# Patient Record
Sex: Female | Born: 1935 | Race: White | Hispanic: No | Smoking: Never smoker
Health system: Southern US, Community
[De-identification: ages and names within clinical notes are randomized; demographics above are authoritative.]

## PROBLEM LIST (undated history)

## (undated) DIAGNOSIS — M545 Low back pain, unspecified: Secondary | ICD-10-CM

## (undated) DIAGNOSIS — F419 Anxiety disorder, unspecified: Secondary | ICD-10-CM

## (undated) DIAGNOSIS — K219 Gastro-esophageal reflux disease without esophagitis: Secondary | ICD-10-CM

## (undated) DIAGNOSIS — F329 Major depressive disorder, single episode, unspecified: Secondary | ICD-10-CM

## (undated) DIAGNOSIS — D1803 Hemangioma of intra-abdominal structures: Secondary | ICD-10-CM

## (undated) DIAGNOSIS — C50919 Malignant neoplasm of unspecified site of unspecified female breast: Secondary | ICD-10-CM

## (undated) DIAGNOSIS — F32A Depression, unspecified: Secondary | ICD-10-CM

## (undated) DIAGNOSIS — IMO0002 Reserved for concepts with insufficient information to code with codable children: Secondary | ICD-10-CM

## (undated) DIAGNOSIS — I10 Essential (primary) hypertension: Secondary | ICD-10-CM

## (undated) DIAGNOSIS — G4700 Insomnia, unspecified: Secondary | ICD-10-CM

## (undated) DIAGNOSIS — E785 Hyperlipidemia, unspecified: Secondary | ICD-10-CM

## (undated) DIAGNOSIS — R202 Paresthesia of skin: Secondary | ICD-10-CM

## (undated) DIAGNOSIS — IMO0001 Reserved for inherently not codable concepts without codable children: Secondary | ICD-10-CM

## (undated) DIAGNOSIS — N281 Cyst of kidney, acquired: Secondary | ICD-10-CM

## (undated) DIAGNOSIS — R011 Cardiac murmur, unspecified: Secondary | ICD-10-CM

## (undated) DIAGNOSIS — B029 Zoster without complications: Secondary | ICD-10-CM

## (undated) HISTORY — DX: Low back pain, unspecified: M54.50

## (undated) HISTORY — DX: Zoster without complications: B02.9

## (undated) HISTORY — DX: Anxiety disorder, unspecified: F41.9

## (undated) HISTORY — DX: Reserved for inherently not codable concepts without codable children: IMO0001

## (undated) HISTORY — DX: Hyperlipidemia, unspecified: E78.5

## (undated) HISTORY — PX: ABDOMINAL HYSTERECTOMY: SHX81

## (undated) HISTORY — DX: Cardiac murmur, unspecified: R01.1

## (undated) HISTORY — DX: Paresthesia of skin: R20.2

## (undated) HISTORY — DX: Gastro-esophageal reflux disease without esophagitis: K21.9

## (undated) HISTORY — DX: Reserved for concepts with insufficient information to code with codable children: IMO0002

## (undated) HISTORY — DX: Low back pain: M54.5

## (undated) HISTORY — DX: Essential (primary) hypertension: I10

## (undated) HISTORY — DX: Depression, unspecified: F32.A

## (undated) HISTORY — DX: Hemangioma of intra-abdominal structures: D18.03

## (undated) HISTORY — DX: Cyst of kidney, acquired: N28.1

## (undated) HISTORY — DX: Major depressive disorder, single episode, unspecified: F32.9

## (undated) HISTORY — DX: Insomnia, unspecified: G47.00

## (undated) HISTORY — DX: Malignant neoplasm of unspecified site of unspecified female breast: C50.919

---

## 1998-08-29 ENCOUNTER — Other Ambulatory Visit: Admission: RE | Admit: 1998-08-29 | Discharge: 1998-08-29 | Payer: Self-pay | Admitting: Obstetrics & Gynecology

## 2000-01-09 ENCOUNTER — Ambulatory Visit (HOSPITAL_COMMUNITY): Admission: RE | Admit: 2000-01-09 | Discharge: 2000-01-09 | Payer: Self-pay | Admitting: Internal Medicine

## 2000-01-09 ENCOUNTER — Encounter: Payer: Self-pay | Admitting: Internal Medicine

## 2000-02-27 ENCOUNTER — Other Ambulatory Visit: Admission: RE | Admit: 2000-02-27 | Discharge: 2000-02-27 | Payer: Self-pay | Admitting: Obstetrics & Gynecology

## 2001-04-02 ENCOUNTER — Ambulatory Visit: Admission: RE | Admit: 2001-04-02 | Discharge: 2001-04-02 | Payer: Self-pay | Admitting: Internal Medicine

## 2001-04-02 ENCOUNTER — Encounter: Payer: Self-pay | Admitting: Internal Medicine

## 2002-05-06 ENCOUNTER — Other Ambulatory Visit: Admission: RE | Admit: 2002-05-06 | Discharge: 2002-05-06 | Payer: Self-pay | Admitting: Obstetrics & Gynecology

## 2004-07-02 ENCOUNTER — Ambulatory Visit: Payer: Self-pay | Admitting: Internal Medicine

## 2004-07-05 ENCOUNTER — Ambulatory Visit: Payer: Self-pay | Admitting: Internal Medicine

## 2004-08-12 ENCOUNTER — Ambulatory Visit: Payer: Self-pay | Admitting: Internal Medicine

## 2005-01-23 ENCOUNTER — Ambulatory Visit: Payer: Self-pay | Admitting: Psychology

## 2005-02-21 ENCOUNTER — Ambulatory Visit: Payer: Self-pay | Admitting: Psychology

## 2005-05-27 ENCOUNTER — Ambulatory Visit: Payer: Self-pay | Admitting: Internal Medicine

## 2005-06-02 ENCOUNTER — Ambulatory Visit: Payer: Self-pay | Admitting: Internal Medicine

## 2005-06-10 ENCOUNTER — Other Ambulatory Visit: Admission: RE | Admit: 2005-06-10 | Discharge: 2005-06-10 | Payer: Self-pay | Admitting: Obstetrics & Gynecology

## 2005-07-03 ENCOUNTER — Ambulatory Visit: Payer: Self-pay | Admitting: Internal Medicine

## 2005-10-02 ENCOUNTER — Ambulatory Visit: Payer: Self-pay | Admitting: Internal Medicine

## 2006-01-22 ENCOUNTER — Ambulatory Visit: Payer: Self-pay | Admitting: Internal Medicine

## 2006-02-09 ENCOUNTER — Ambulatory Visit: Payer: Self-pay | Admitting: Internal Medicine

## 2006-02-17 ENCOUNTER — Ambulatory Visit: Payer: Self-pay

## 2006-03-05 ENCOUNTER — Ambulatory Visit: Payer: Self-pay | Admitting: Internal Medicine

## 2006-04-23 ENCOUNTER — Ambulatory Visit: Payer: Self-pay | Admitting: Internal Medicine

## 2006-10-16 ENCOUNTER — Ambulatory Visit: Payer: Self-pay | Admitting: Internal Medicine

## 2006-10-16 LAB — CONVERTED CEMR LAB
ALT: 11 units/L (ref 0–40)
AST: 18 units/L (ref 0–37)
BUN: 19 mg/dL (ref 6–23)
Bacteria, UA: NEGATIVE
Bilirubin Urine: NEGATIVE
Cholesterol: 143 mg/dL (ref 0–200)
Creatinine, Ser: 0.9 mg/dL (ref 0.4–1.2)
Crystals: NEGATIVE
Glucose, Bld: 111 mg/dL — ABNORMAL HIGH (ref 70–99)
HDL: 48.2 mg/dL (ref >39.0)
Hemoglobin, Urine: NEGATIVE
Ketones, ur: NEGATIVE mg/dL
LDL Cholesterol: 62 mg/dL (ref 0–99)
Mucus, UA: NEGATIVE
Nitrite: NEGATIVE
Potassium: 4.6 meq/L (ref 3.5–5.1)
Sodium: 143 meq/L (ref 135–145)
Specific Gravity, Urine: 1.01 (ref 1.000–1.03)
TSH: 1.54 microintl units/mL (ref 0.35–5.50)
Total CHOL/HDL Ratio: 3
Total Protein, Urine: NEGATIVE mg/dL
Triglycerides: 165 mg/dL — ABNORMAL HIGH (ref 0–149)
Urine Glucose: NEGATIVE mg/dL
Urobilinogen, UA: 0.2 (ref 0.0–1.0)
VLDL: 33 mg/dL (ref 0–40)
Vit D, 1,25-Dihydroxy: 23 (ref 20–57)
pH: 6 (ref 5.0–8.0)

## 2006-10-23 ENCOUNTER — Ambulatory Visit: Payer: Self-pay | Admitting: Internal Medicine

## 2007-02-01 ENCOUNTER — Ambulatory Visit: Payer: Self-pay | Admitting: Internal Medicine

## 2007-03-24 ENCOUNTER — Encounter: Payer: Self-pay | Admitting: *Deleted

## 2007-03-24 ENCOUNTER — Ambulatory Visit: Payer: Self-pay | Admitting: Internal Medicine

## 2007-03-24 DIAGNOSIS — R209 Unspecified disturbances of skin sensation: Secondary | ICD-10-CM

## 2007-03-24 DIAGNOSIS — D1803 Hemangioma of intra-abdominal structures: Secondary | ICD-10-CM | POA: Insufficient documentation

## 2007-03-24 DIAGNOSIS — I10 Essential (primary) hypertension: Secondary | ICD-10-CM

## 2007-03-24 DIAGNOSIS — K219 Gastro-esophageal reflux disease without esophagitis: Secondary | ICD-10-CM

## 2007-03-24 DIAGNOSIS — Z8679 Personal history of other diseases of the circulatory system: Secondary | ICD-10-CM | POA: Insufficient documentation

## 2007-03-24 DIAGNOSIS — N281 Cyst of kidney, acquired: Secondary | ICD-10-CM | POA: Insufficient documentation

## 2007-03-24 DIAGNOSIS — F329 Major depressive disorder, single episode, unspecified: Secondary | ICD-10-CM | POA: Insufficient documentation

## 2007-03-24 DIAGNOSIS — M810 Age-related osteoporosis without current pathological fracture: Secondary | ICD-10-CM

## 2007-03-24 DIAGNOSIS — E785 Hyperlipidemia, unspecified: Secondary | ICD-10-CM

## 2007-03-24 DIAGNOSIS — Z9079 Acquired absence of other genital organ(s): Secondary | ICD-10-CM | POA: Insufficient documentation

## 2007-03-24 DIAGNOSIS — Z87898 Personal history of other specified conditions: Secondary | ICD-10-CM | POA: Insufficient documentation

## 2007-04-26 ENCOUNTER — Ambulatory Visit: Payer: Self-pay | Admitting: Internal Medicine

## 2007-04-26 DIAGNOSIS — M79609 Pain in unspecified limb: Secondary | ICD-10-CM | POA: Insufficient documentation

## 2007-04-26 LAB — CONVERTED CEMR LAB
ALT: 12 units/L (ref 0–35)
AST: 18 units/L (ref 0–37)
Albumin: 3.8 g/dL (ref 3.5–5.2)
Alkaline Phosphatase: 41 units/L (ref 39–117)
BUN: 17 mg/dL (ref 6–23)
Bilirubin, Direct: 0.1 mg/dL (ref 0.0–0.3)
CO2: 30 meq/L (ref 19–32)
Calcium: 8.9 mg/dL (ref 8.4–10.5)
Chloride: 110 meq/L (ref 96–112)
Cholesterol: 168 mg/dL (ref 0–200)
Creatinine, Ser: 0.8 mg/dL (ref 0.4–1.2)
GFR calc Af Amer: 91 mL/min
GFR calc non Af Amer: 75 mL/min
Glucose, Bld: 114 mg/dL — ABNORMAL HIGH (ref 70–99)
HDL: 47.6 mg/dL (ref >39.0)
Hgb A1c MFr Bld: 5.7 % (ref 4.6–6.0)
LDL Cholesterol: 96 mg/dL (ref 0–99)
Potassium: 4.1 meq/L (ref 3.5–5.1)
Sodium: 145 meq/L (ref 135–145)
Total Bilirubin: 0.8 mg/dL (ref 0.3–1.2)
Total CHOL/HDL Ratio: 3.5
Total Protein: 6.3 g/dL (ref 6.0–8.3)
Triglycerides: 120 mg/dL (ref 0–149)
VLDL: 24 mg/dL (ref 0–40)

## 2007-05-07 ENCOUNTER — Ambulatory Visit: Payer: Self-pay | Admitting: Internal Medicine

## 2007-05-07 DIAGNOSIS — R7309 Other abnormal glucose: Secondary | ICD-10-CM

## 2007-05-10 ENCOUNTER — Ambulatory Visit: Payer: Self-pay | Admitting: Internal Medicine

## 2007-05-10 ENCOUNTER — Encounter: Payer: Self-pay | Admitting: Internal Medicine

## 2007-06-29 ENCOUNTER — Other Ambulatory Visit: Admission: RE | Admit: 2007-06-29 | Discharge: 2007-06-29 | Payer: Self-pay | Admitting: Radiology

## 2007-07-13 ENCOUNTER — Telehealth: Payer: Self-pay | Admitting: Internal Medicine

## 2007-09-30 ENCOUNTER — Telehealth: Payer: Self-pay | Admitting: Internal Medicine

## 2007-10-05 ENCOUNTER — Encounter: Payer: Self-pay | Admitting: Internal Medicine

## 2007-10-06 ENCOUNTER — Ambulatory Visit: Payer: Self-pay | Admitting: Internal Medicine

## 2007-10-06 ENCOUNTER — Telehealth: Payer: Self-pay | Admitting: Internal Medicine

## 2007-10-06 ENCOUNTER — Encounter: Payer: Self-pay | Admitting: Internal Medicine

## 2007-10-28 ENCOUNTER — Ambulatory Visit: Payer: Self-pay | Admitting: Internal Medicine

## 2007-10-29 LAB — CONVERTED CEMR LAB
ALT: 13 units/L (ref 0–35)
AST: 19 units/L (ref 0–37)
Albumin: 4 g/dL (ref 3.5–5.2)
Alkaline Phosphatase: 56 units/L (ref 39–117)
BUN: 14 mg/dL (ref 6–23)
Basophils Absolute: 0.1 10*3/uL (ref 0.0–0.1)
Basophils Relative: 0.9 % (ref 0.0–1.0)
Bilirubin, Direct: 0.1 mg/dL (ref 0.0–0.3)
CO2: 27 meq/L (ref 19–32)
Calcium: 9.3 mg/dL (ref 8.4–10.5)
Chloride: 104 meq/L (ref 96–112)
Cholesterol: 235 mg/dL (ref 0–200)
Creatinine, Ser: 0.9 mg/dL (ref 0.4–1.2)
Direct LDL: 159 mg/dL
Eosinophils Absolute: 0.1 10*3/uL (ref 0.0–0.7)
Eosinophils Relative: 1.4 % (ref 0.0–5.0)
GFR calc Af Amer: 79 mL/min
GFR calc non Af Amer: 65 mL/min
Glucose, Bld: 117 mg/dL — ABNORMAL HIGH (ref 70–99)
HCT: 42 % (ref 36.0–46.0)
HDL: 51.6 mg/dL (ref >39.0)
Hemoglobin: 14.4 g/dL (ref 12.0–15.0)
Lymphocytes Relative: 33.1 % (ref 12.0–46.0)
MCHC: 34.3 g/dL (ref 30.0–36.0)
MCV: 84.6 fL (ref 78.0–100.0)
Monocytes Absolute: 0.6 10*3/uL (ref 0.1–1.0)
Monocytes Relative: 9.6 % (ref 3.0–12.0)
Neutro Abs: 3.3 10*3/uL (ref 1.4–7.7)
Neutrophils Relative %: 55 % (ref 43.0–77.0)
Platelets: 242 10*3/uL (ref 150–400)
Potassium: 4.2 meq/L (ref 3.5–5.1)
RBC: 4.96 M/uL (ref 3.87–5.11)
RDW: 12.5 % (ref 11.5–14.6)
Sodium: 140 meq/L (ref 135–145)
TSH: 1.53 microintl units/mL (ref 0.35–5.50)
Total Bilirubin: 0.9 mg/dL (ref 0.3–1.2)
Total CHOL/HDL Ratio: 4.6
Total Protein: 6.9 g/dL (ref 6.0–8.3)
Triglycerides: 126 mg/dL (ref 0–149)
VLDL: 25 mg/dL (ref 0–40)
WBC: 6.1 10*3/uL (ref 4.5–10.5)

## 2007-11-03 ENCOUNTER — Ambulatory Visit: Payer: Self-pay | Admitting: Internal Medicine

## 2007-11-03 DIAGNOSIS — IMO0001 Reserved for inherently not codable concepts without codable children: Secondary | ICD-10-CM

## 2008-02-29 ENCOUNTER — Ambulatory Visit: Payer: Self-pay | Admitting: Internal Medicine

## 2008-02-29 LAB — CONVERTED CEMR LAB: Vit D, 1,25-Dihydroxy: 29 — ABNORMAL LOW (ref 30–89)

## 2008-03-01 LAB — CONVERTED CEMR LAB
BUN: 17 mg/dL (ref 6–23)
CO2: 30 meq/L (ref 19–32)
Calcium: 9.2 mg/dL (ref 8.4–10.5)
Chloride: 103 meq/L (ref 96–112)
Cholesterol: 219 mg/dL (ref 0–200)
Creatinine, Ser: 0.9 mg/dL (ref 0.4–1.2)
Direct LDL: 133.2 mg/dL
GFR calc Af Amer: 79 mL/min
GFR calc non Af Amer: 65 mL/min
Glucose, Bld: 112 mg/dL — ABNORMAL HIGH (ref 70–99)
HDL: 47.7 mg/dL (ref >39.0)
Potassium: 4.6 meq/L (ref 3.5–5.1)
Sodium: 141 meq/L (ref 135–145)
Total CHOL/HDL Ratio: 4.6
Triglycerides: 179 mg/dL — ABNORMAL HIGH (ref 0–149)
VLDL: 36 mg/dL (ref 0–40)

## 2008-03-07 ENCOUNTER — Ambulatory Visit: Payer: Self-pay | Admitting: Internal Medicine

## 2008-06-27 ENCOUNTER — Ambulatory Visit: Payer: Self-pay | Admitting: Internal Medicine

## 2008-06-27 DIAGNOSIS — R42 Dizziness and giddiness: Secondary | ICD-10-CM | POA: Insufficient documentation

## 2008-07-03 ENCOUNTER — Ambulatory Visit: Payer: Self-pay | Admitting: Internal Medicine

## 2008-07-03 LAB — CONVERTED CEMR LAB
BUN: 22 mg/dL (ref 6–23)
CO2: 31 meq/L (ref 19–32)
Calcium: 9 mg/dL (ref 8.4–10.5)
Chloride: 101 meq/L (ref 96–112)
Creatinine, Ser: 0.8 mg/dL (ref 0.4–1.2)
GFR calc Af Amer: 90 mL/min
GFR calc non Af Amer: 75 mL/min
Glucose, Bld: 105 mg/dL — ABNORMAL HIGH (ref 70–99)
Potassium: 3.9 meq/L (ref 3.5–5.1)
Sodium: 140 meq/L (ref 135–145)

## 2008-07-11 ENCOUNTER — Ambulatory Visit: Payer: Self-pay | Admitting: Internal Medicine

## 2008-07-11 DIAGNOSIS — M545 Low back pain: Secondary | ICD-10-CM

## 2008-08-14 ENCOUNTER — Telehealth: Payer: Self-pay | Admitting: Internal Medicine

## 2008-08-16 ENCOUNTER — Ambulatory Visit: Payer: Self-pay | Admitting: Internal Medicine

## 2008-08-16 ENCOUNTER — Encounter (INDEPENDENT_AMBULATORY_CARE_PROVIDER_SITE_OTHER): Payer: Self-pay | Admitting: *Deleted

## 2009-01-02 ENCOUNTER — Ambulatory Visit: Payer: Self-pay | Admitting: Internal Medicine

## 2009-01-02 LAB — CONVERTED CEMR LAB
BUN: 18 mg/dL (ref 6–23)
CO2: 33 meq/L — ABNORMAL HIGH (ref 19–32)
Calcium: 9.1 mg/dL (ref 8.4–10.5)
Chloride: 106 meq/L (ref 96–112)
Creatinine, Ser: 0.8 mg/dL (ref 0.4–1.2)
GFR calc non Af Amer: 74.53 mL/min (ref >60)
Glucose, Bld: 111 mg/dL — ABNORMAL HIGH (ref 70–99)
Potassium: 4.4 meq/L (ref 3.5–5.1)
Sodium: 144 meq/L (ref 135–145)

## 2009-01-23 ENCOUNTER — Ambulatory Visit: Payer: Self-pay | Admitting: Internal Medicine

## 2009-01-23 DIAGNOSIS — R5383 Other fatigue: Secondary | ICD-10-CM

## 2009-04-16 ENCOUNTER — Ambulatory Visit: Payer: Self-pay | Admitting: Internal Medicine

## 2009-04-16 LAB — CONVERTED CEMR LAB
ALT: 10 units/L (ref 0–35)
AST: 19 units/L (ref 0–37)
Albumin: 4.1 g/dL (ref 3.5–5.2)
Alkaline Phosphatase: 55 units/L (ref 39–117)
BUN: 20 mg/dL (ref 6–23)
Basophils Absolute: 0 10*3/uL (ref 0.0–0.1)
Basophils Relative: 0.3 % (ref 0.0–3.0)
Bilirubin, Direct: 0.1 mg/dL (ref 0.0–0.3)
CO2: 32 meq/L (ref 19–32)
Calcium: 9.2 mg/dL (ref 8.4–10.5)
Chloride: 103 meq/L (ref 96–112)
Cholesterol: 243 mg/dL — ABNORMAL HIGH (ref 0–200)
Creatinine, Ser: 1.1 mg/dL (ref 0.4–1.2)
Direct LDL: 183.8 mg/dL
Eosinophils Absolute: 0.1 10*3/uL (ref 0.0–0.7)
Eosinophils Relative: 1.7 % (ref 0.0–5.0)
GFR calc non Af Amer: 51.57 mL/min (ref >60)
Glucose, Bld: 116 mg/dL — ABNORMAL HIGH (ref 70–99)
HCT: 41.9 % (ref 36.0–46.0)
HDL: 49.6 mg/dL (ref >39.00)
Hemoglobin: 14.2 g/dL (ref 12.0–15.0)
Lymphocytes Relative: 31.9 % (ref 12.0–46.0)
Lymphs Abs: 2.2 10*3/uL (ref 0.7–4.0)
MCHC: 33.8 g/dL (ref 30.0–36.0)
MCV: 85.5 fL (ref 78.0–100.0)
Monocytes Absolute: 0.6 10*3/uL (ref 0.1–1.0)
Monocytes Relative: 9 % (ref 3.0–12.0)
Neutro Abs: 3.9 10*3/uL (ref 1.4–7.7)
Neutrophils Relative %: 57.1 % (ref 43.0–77.0)
Platelets: 225 10*3/uL (ref 150.0–400.0)
Potassium: 4.4 meq/L (ref 3.5–5.1)
RBC: 4.9 M/uL (ref 3.87–5.11)
RDW: 13 % (ref 11.5–14.6)
Sodium: 142 meq/L (ref 135–145)
TSH: 1.3 microintl units/mL (ref 0.35–5.50)
Total Bilirubin: 0.7 mg/dL (ref 0.3–1.2)
Total CHOL/HDL Ratio: 5
Total Protein: 6.9 g/dL (ref 6.0–8.3)
Triglycerides: 133 mg/dL (ref 0.0–149.0)
VLDL: 26.6 mg/dL (ref 0.0–40.0)
WBC: 6.8 10*3/uL (ref 4.5–10.5)

## 2009-04-26 ENCOUNTER — Ambulatory Visit: Payer: Self-pay | Admitting: Internal Medicine

## 2009-06-28 ENCOUNTER — Telehealth: Payer: Self-pay | Admitting: Internal Medicine

## 2009-09-04 ENCOUNTER — Ambulatory Visit: Payer: Self-pay | Admitting: Internal Medicine

## 2009-09-04 LAB — CONVERTED CEMR LAB
ALT: 11 units/L (ref 0–35)
AST: 17 units/L (ref 0–37)
Albumin: 4 g/dL (ref 3.5–5.2)
Alkaline Phosphatase: 61 units/L (ref 39–117)
Calcium: 9.3 mg/dL (ref 8.4–10.5)
Cholesterol: 235 mg/dL — ABNORMAL HIGH (ref 0–200)
Creatinine, Ser: 0.9 mg/dL (ref 0.4–1.2)
Total Bilirubin: 0.8 mg/dL (ref 0.3–1.2)
Total CK: 46 units/L (ref 7–177)
Triglycerides: 123 mg/dL (ref 0.0–149.0)

## 2009-09-10 ENCOUNTER — Ambulatory Visit: Payer: Self-pay | Admitting: Internal Medicine

## 2009-10-29 ENCOUNTER — Encounter: Payer: Self-pay | Admitting: Internal Medicine

## 2010-01-04 ENCOUNTER — Ambulatory Visit: Payer: Self-pay | Admitting: Internal Medicine

## 2010-01-04 LAB — CONVERTED CEMR LAB
ALT: 12 units/L (ref 0–35)
AST: 17 units/L (ref 0–37)
Albumin: 3.9 g/dL (ref 3.5–5.2)
BUN: 19 mg/dL (ref 6–23)
CO2: 31 meq/L (ref 19–32)
Cholesterol: 221 mg/dL — ABNORMAL HIGH (ref 0–200)
Direct LDL: 152.5 mg/dL
GFR calc non Af Amer: 74.33 mL/min (ref >60)
Glucose, Bld: 91 mg/dL (ref 70–99)
Potassium: 4.3 meq/L (ref 3.5–5.1)
Total Protein: 6.2 g/dL (ref 6.0–8.3)
VLDL: 42.2 mg/dL — ABNORMAL HIGH (ref 0.0–40.0)

## 2010-01-11 ENCOUNTER — Ambulatory Visit: Payer: Self-pay | Admitting: Internal Medicine

## 2010-01-11 DIAGNOSIS — M6530 Trigger finger, unspecified finger: Secondary | ICD-10-CM | POA: Insufficient documentation

## 2010-01-17 ENCOUNTER — Encounter: Payer: Self-pay | Admitting: Internal Medicine

## 2010-05-13 ENCOUNTER — Ambulatory Visit: Payer: Self-pay | Admitting: Internal Medicine

## 2010-05-21 ENCOUNTER — Ambulatory Visit: Payer: Self-pay | Admitting: Internal Medicine

## 2010-06-25 NOTE — Medication Information (Signed)
Summary: Ranitidine & Zolpidem/[facility]  Ranitidine & Zolpidem/[facility]   Imported By: Sherian Rein 10/31/2009 11:35:01  _____________________________________________________________________  External Attachment:    Type:   Image     Comment:   External Document

## 2010-06-25 NOTE — Assessment & Plan Note (Signed)
Summary: 4 MO ROV /NWS  #   Vital Signs:  Patient profile:   77 year old female Height:      61.5 inches Weight:      140.38 pounds BMI:     26.19 O2 Sat:      95 % on Room air Temp:     99.0 degrees F oral Pulse rate:   76 / minute BP sitting:   130 / 70  (left arm) Cuff size:   regular  Vitals Entered By: Lucious Groves (September 10, 2009 8:19 AM)  O2 Flow:  Room air CC: 4 mo rtn ov--Per pt no symptoms or complaints. Needs refill of Hyzaar./kb Is Patient Diabetic? No Pain Assessment Patient in pain? no        CC:  4 mo rtn ov--Per pt no symptoms or complaints. Needs refill of Hyzaar./kb.  History of Present Illness: The patient presents with complaints of sore throat, fever, cough, sinus congestion and drainge of several days duration. Not better with OTC meds. Muscle aches are not  present.  The mucus is colored. The patient presents for a follow up of hypertension, GERD, hyperlipidemia  No myalgias  Current Medications (verified): 1)  Wellbutrin Xl 150 Mg  Tb24 (Bupropion Hcl) .... Take One Tablet Once Daily 2)  Ambien 10 Mg  Tabs (Zolpidem Tartrate) .... Take One Tablet By Mouth At Bedtime As Needed For Insomnia 3)  Vitamin D3 1000 Unit  Tabs (Cholecalciferol) .... 2 Qd 4)  Vaniqa 13.9 % Crea (Eflornithine Hcl) 5)  Aspirin Ec 81 Mg Tbec (Aspirin) .... Once Daily 6)  Hyzaar 100-25 Mg Tabs (Losartan Potassium-Hctz) .... Take 1 Tab By Mouth Daily 7)  Meclizine Hcl 12.5 Mg Tabs (Meclizine Hcl) .Marland Kitchen.. 1-2 By Mouth Qid As Needed Vertigo 8)  Ranitidine Hcl 150 Mg Tabs (Ranitidine Hcl) .Marland Kitchen.. 1 By Mouth Two Times A Day For Indigestion 9)  Pravastatin Sodium 40 Mg Tabs (Pravastatin Sodium) .Marland Kitchen.. 1 By Mouth Once Daily For Cholesterol  Allergies (verified): 1)  Zocor (Simvastatin)  Past History:  Past Medical History: Last updated: 01/23/2009 HEMANGIOMA, HEPATIC (ICD-228.04) Hx of RENAL CYST (ICD-593.2) SHINGLES, HX OF (ICD-V13.8) Hx of PARESTHESIA (ICD-782.0) HEART MURMUR, HX  OF (ICD-V12.50) DYSLIPIDEMIA (ICD-272.4) GASTROESOPHAGEAL REFLUX DISEASE (ICD-530.81) OSTEOPOROSIS (ICD-733.00) INSOMNIA, HX OF (ICD-V15.89) DEPRESSION (ICD-311) HYPERTENSION (ICD-401.9) Gyn  Dr Orvilla Cornwall glu 2010   Low back pain  Social History: Last updated: 05/07/2007 Retired Married Never Smoked Alcohol use-no  Physical Exam  General:  NAD Mouth:  Erythematous throat mucosa and intranasal erythema.  Neck:  supple and no masses.   Lungs:  normal respiratory effort and normal breath sounds.   Heart:  normal rate and regular rhythm.   Abdomen:  Bowel sounds positive,abdomen soft and non-tender without masses, organomegaly or hernias noted. Msk:  No deformity or scoliosis noted of thoracic or lumbar spine.   Neurologic:  No cranial nerve deficits noted. Station and gait are normal. Plantar reflexes are down-going bilaterally. DTRs are symmetrical throughout. Sensory, motor and coordinative functions appear intact. Psych:  Cognition and judgment appear intact. Alert and cooperative with normal attention span and concentration. No apparent delusions, illusions, hallucinations   Impression & Recommendations:  Problem # 1:  FATIGUE (ICD-780.79) Assessment Improved  Problem # 2:  LOW BACK PAIN (ICD-724.2) Assessment: Improved  Her updated medication list for this problem includes:    Aspirin Ec 81 Mg Tbec (Aspirin) ..... Once daily  Problem # 3:  MYALGIA (ICD-729.1) resolved off statins Assessment: Comment Only  Her updated medication list for this problem includes:    Aspirin Ec 81 Mg Tbec (Aspirin) ..... Once daily  Problem # 4:  HYPERTENSION (ICD-401.9) Assessment: Improved  Her updated medication list for this problem includes:    Hyzaar 100-25 Mg Tabs (Losartan potassium-hctz) .Marland Kitchen... Take 1 tab by mouth daily  BP today: 130/70 Prior BP: 142/84 (04/26/2009)  Labs Reviewed: K+: 3.9 (09/04/2009) Creat: : 0.9 (09/04/2009)   Chol: 235 (09/04/2009)   HDL: 54.30  (09/04/2009)   LDL: DEL (02/29/2008)   TG: 123.0 (09/04/2009)  Problem # 5:  GASTROESOPHAGEAL REFLUX DISEASE (ICD-530.81) Assessment: Improved  Her updated medication list for this problem includes:    Ranitidine Hcl 150 Mg Tabs (Ranitidine hcl) .Marland Kitchen... 1 by mouth two times a day for indigestion  Problem # 6:  DYSLIPIDEMIA (ICD-272.4) Assessment: Deteriorated  Her updated medication list for this problem includes:    Pravastatin Sodium 40 Mg Tabs (Pravastatin sodium) .Marland Kitchen... 1 by mouth once daily for cholesterol - not taking. Try to restart.  Problem # 7:  URI - better Assessment: New See "Patient Instructions".   Complete Medication List: 1)  Wellbutrin Xl 150 Mg Tb24 (Bupropion hcl) .... Take one tablet once daily 2)  Ambien 10 Mg Tabs (Zolpidem tartrate) .... Take one tablet by mouth at bedtime as needed for insomnia 3)  Vitamin D3 1000 Unit Tabs (Cholecalciferol) .... 2 qd 4)  Vaniqa 13.9 % Crea (Eflornithine hcl) 5)  Aspirin Ec 81 Mg Tbec (Aspirin) .... Once daily 6)  Hyzaar 100-25 Mg Tabs (Losartan potassium-hctz) .... Take 1 tab by mouth daily 7)  Meclizine Hcl 12.5 Mg Tabs (Meclizine hcl) .Marland Kitchen.. 1-2 by mouth qid as needed vertigo 8)  Ranitidine Hcl 150 Mg Tabs (Ranitidine hcl) .Marland Kitchen.. 1 by mouth two times a day for indigestion 9)  Pravastatin Sodium 40 Mg Tabs (Pravastatin sodium) .Marland Kitchen.. 1 by mouth once daily for cholesterol  Patient Instructions: 1)  Use over-the-counter medicines for "cold": Tylenol  650mg  or Advil 400mg  every 6 hours  for fever; Delsym or Robutussin for cough. Mucinex or Mucinex D for congestion. Ricola or Halls for sore throat. Office visit if not better or if worse.  2)  Please schedule a follow-up appointment in 4 months. 3)  BMP prior to visit, ICD-9: 4)  Hepatic Panel prior to visit, ICD-9: 5)  Lipid Panel prior to visit, ICD-9: 6)  CK  272.0 995.20 Prescriptions: HYZAAR 100-25 MG TABS (LOSARTAN POTASSIUM-HCTZ) Take 1 tab by mouth daily  #90 x 3   Entered  and Authorized by:   Tresa Garter MD   Signed by:   Tresa Garter MD on 09/10/2009   Method used:   Faxed to ...       [facility]       [address] [place], [place]  [address]       Ph: [phone]       Fax: [phone]   RxID:   [id number] HYZAAR 100-25 MG TABS (LOSARTAN POTASSIUM-HCTZ) Take 1 tab by mouth daily  #30 x 3   Entered and Authorized by:   Tresa Garter MD   Signed by:   Tresa Garter MD on 09/10/2009   Method used:   Print then Give to Patient   RxID:   [id number]

## 2010-06-25 NOTE — Medication Information (Signed)
Summary: Pravastatin / [facility]  Pravastatin / [facility]   Imported By: Lennie Odor 01/22/2010 15:30:08  _____________________________________________________________________  External Attachment:    Type:   Image     Comment:   External Document

## 2010-06-25 NOTE — Progress Notes (Signed)
Summary: buprop  Phone Note Other Incoming Call back at fax   Caller: [facility] Details for Reason: refill on buprop 24 xl  tab Details of Action Taken: ok x 2 refills Initial call taken by: Tora Perches,  June 28, 2009 9:03 AM    Prescriptions: WELLBUTRIN XL 150 MG  TB24 (BUPROPION HCL) Take one tablet once daily  #90 x 2   Entered by:   Tora Perches   Authorized by:   Tresa Garter MD   Signed by:   Tora Perches on 06/28/2009   Method used:   Faxed to ...       [facility] (mail[facility]order)       [address] [place], [place]  [address]       Ph: [phone]       Fax: [phone]   RxID:   [id number]

## 2010-06-25 NOTE — Assessment & Plan Note (Signed)
Summary: 4 mth fu  stc   Vital Signs:  Patient profile:   77 year old female Height:      61.5 inches Weight:      141 pounds BMI:     26.31 O2 Sat:      93 % on Room air Temp:     98.6 degrees F oral Pulse rate:   80 / minute Pulse rhythm:   regular Resp:     16 per minute BP sitting:   128 / 80  (left arm) Cuff size:   regular  Vitals Entered By: Lanier Prude, CMA(AAMA) (January 11, 2010 8:08 AM)  O2 Flow:  Room air CC: 4 mo f/u Is Patient Diabetic? No Comments pt i s not taking Pravastatin   CC:  4 mo f/u.  History of Present Illness: The patient presents for a follow up of hypertension, GERD, hyperlipidemia Not taking Pravachol C/o R 4th trigger finger reoccured  Current Medications (verified): 1)  Wellbutrin Xl 150 Mg  Tb24 (Bupropion Hcl) .... Take One Tablet Once Daily 2)  Ambien 10 Mg  Tabs (Zolpidem Tartrate) .... Take One Tablet By Mouth At Bedtime As Needed For Insomnia 3)  Vitamin D3 1000 Unit  Tabs (Cholecalciferol) .... 2 Qd 4)  Vaniqa 13.9 % Crea (Eflornithine Hcl) 5)  Aspirin Ec 81 Mg Tbec (Aspirin) .... Once Daily 6)  Hyzaar 100-25 Mg Tabs (Losartan Potassium-Hctz) .... Take 1 Tab By Mouth Daily 7)  Meclizine Hcl 12.5 Mg Tabs (Meclizine Hcl) .Marland Kitchen.. 1-2 By Mouth Qid As Needed Vertigo 8)  Ranitidine Hcl 150 Mg Tabs (Ranitidine Hcl) .Marland Kitchen.. 1 By Mouth Two Times A Day For Indigestion 9)  Pravastatin Sodium 40 Mg Tabs (Pravastatin Sodium) .Marland Kitchen.. 1 By Mouth Once Daily For Cholesterol  Allergies (verified): 1)  Zocor (Simvastatin)  Past History:  Past Medical History: Last updated: 01/23/2009 HEMANGIOMA, HEPATIC (ICD-228.04) Hx of RENAL CYST (ICD-593.2) SHINGLES, HX OF (ICD-V13.8) Hx of PARESTHESIA (ICD-782.0) HEART MURMUR, HX OF (ICD-V12.50) DYSLIPIDEMIA (ICD-272.4) GASTROESOPHAGEAL REFLUX DISEASE (ICD-530.81) OSTEOPOROSIS (ICD-733.00) INSOMNIA, HX OF (ICD-V15.89) DEPRESSION (ICD-311) HYPERTENSION (ICD-401.9) Gyn  Dr Orvilla Cornwall glu 2010   Low back  pain  Past Surgical History: Last updated: 03/24/2007 HYSTERECTOMY, HX OF (ICD-V45.77)  Social History: Last updated: 05/07/2007 Retired Married Never Smoked Alcohol use-no  Family History: Reviewed history from 05/07/2007 and no changes required. F cancer M renal failure  Social History: Reviewed history from 05/07/2007 and no changes required. Retired Married Never Smoked Alcohol use-no  Review of Systems  The patient denies fever, chest pain, syncope, and abdominal pain.    Physical Exam  General:  NAD Ears:  WNL Mouth:  WNL Lungs:  normal respiratory effort and normal breath sounds.   Heart:  normal rate and regular rhythm.   Abdomen:  Bowel sounds positive,abdomen soft and non-tender without masses, organomegaly or hernias noted. Msk:  R 4th finger is triggering Neurologic:  No cranial nerve deficits noted. Station and gait are normal. Plantar reflexes are down-going bilaterally. DTRs are symmetrical throughout. Sensory, motor and coordinative functions appear intact. Skin:  Intact without suspicious lesions or rashes Psych:  Cognition and judgment appear intact. Alert and cooperative with normal attention span and concentration. No apparent delusions, illusions, hallucinations   Impression & Recommendations:  Problem # 1:  FATIGUE (ICD-780.79) Assessment Improved  Problem # 2:  LOW BACK PAIN (ICD-724.2) Assessment: Improved Use stretching and balance exercises that I have provided (15 min. or longer every day)  Her updated medication list for this  problem includes:    Aspirin Ec 81 Mg Tbec (Aspirin) ..... Once daily  Problem # 3:  MYALGIA (ICD-729.1) Assessment: Improved  Her updated medication list for this problem includes:    Aspirin Ec 81 Mg Tbec (Aspirin) ..... Once daily  Problem # 4:  DYSLIPIDEMIA (ICD-272.4) Assessment: Deteriorated  Her updated medication list for this problem includes:    Pravastatin Sodium 40 Mg Tabs (Pravastatin  sodium) .Marland Kitchen... 1 by mouth once daily for cholesterol  - restart  Labs Reviewed: SGOT: 17 (01/04/2010)   SGPT: 12 (01/04/2010)   HDL:51.00 (01/04/2010), 54.30 (09/04/2009)  LDL:DEL (02/29/2008), DEL (10/28/2007)  Chol:221 (01/04/2010), 235 (09/04/2009)  Trig:211.0 (01/04/2010), 123.0 (09/04/2009)  Problem # 5:  HYPERTENSION (ICD-401.9) Assessment: Unchanged  Her updated medication list for this problem includes:    Hyzaar 100-25 Mg Tabs (Losartan potassium-hctz) .Marland Kitchen... Take 1 tab by mouth daily  BP today: 128/80 Prior BP: 130/70 (09/10/2009)  Labs Reviewed: K+: 4.3 (01/04/2010) Creat: : 0.8 (01/04/2010)   Chol: 221 (01/04/2010)   HDL: 51.00 (01/04/2010)   LDL: DEL (02/29/2008)   TG: 211.0 (01/04/2010)  Problem # 6:  TRIGGER FINGER (ICD-727.03) R 4th Assessment: Deteriorated Dr Charlann Boxer appt is pending  Pennsaid  Complete Medication List: 1)  Wellbutrin Xl 150 Mg Tb24 (Bupropion hcl) .... Take one tablet once daily 2)  Ambien 10 Mg Tabs (Zolpidem tartrate) .... Take one tablet by mouth at bedtime as needed for insomnia 3)  Vitamin D3 1000 Unit Tabs (Cholecalciferol) .... 2 qd 4)  Vaniqa 13.9 % Crea (Eflornithine hcl) 5)  Aspirin Ec 81 Mg Tbec (Aspirin) .... Once daily 6)  Hyzaar 100-25 Mg Tabs (Losartan potassium-hctz) .... Take 1 tab by mouth daily 7)  Meclizine Hcl 12.5 Mg Tabs (Meclizine hcl) .Marland Kitchen.. 1-2 by mouth qid as needed vertigo 8)  Ranitidine Hcl 150 Mg Tabs (Ranitidine hcl) .Marland Kitchen.. 1 by mouth two times a day for indigestion 9)  Pravastatin Sodium 40 Mg Tabs (Pravastatin sodium) .Marland Kitchen.. 1 by mouth once daily for cholesterol 10)  Pennsaid 1.5 % Soln (Diclofenac sodium) .... 3-5 gtt on skin three times a day for pain  Patient Instructions: 1)  Please schedule a follow-up appointment in 4 months well w/labs. Prescriptions: PENNSAID 1.5 % SOLN (DICLOFENAC SODIUM) 3-5 gtt on skin three times a day for pain  #1 x 3   Entered and Authorized by:   Tresa Garter MD   Signed by:    Tresa Garter MD on 01/11/2010   Method used:   Faxed to ...       [facility] ([facility])       [address] [place], [place]  [address]       Ph: [phone]       Fax: [phone]   RxID:   [id number] PRAVASTATIN SODIUM 40 MG TABS (PRAVASTATIN SODIUM) 1 by mouth once daily for cholesterol  #30 x 12   Entered and Authorized by:   Tresa Garter MD   Signed by:   Tresa Garter MD on 01/11/2010   Method used:   Faxed to ...       [facility] (mail-order)       [address] [place], [place]  [address]       Ph: [phone]       Fax: [phone]   RxID:   [id number] RANITIDINE HCL 150 MG TABS (RANITIDINE HCL) 1 by mouth two times a day  for indigestion  #60 x 12   Entered and Authorized by:   Tresa Garter MD   Signed by:   Tresa Garter MD on 01/11/2010   Method used:   Faxed to ...       [facility] (mail-order)       [address] [place], [place]  [address]       Ph: [phone]       Fax: [phone]   RxID:   [id number] HYZAAR 100-25 MG TABS (LOSARTAN POTASSIUM-HCTZ) Take 1 tab by mouth daily  #90 x 3   Entered and Authorized by:   Tresa Garter MD   Signed by:   Tresa Garter MD on 01/11/2010   Method used:   Faxed to ...       [facility] (mail-order)       [address] [place], [place]  [address]       Ph: [phone]       Fax: [phone]   RxID:   [id number] VANIQA 13.9 % CREA (EFLORNITHINE HCL)   #45 g x 3   Entered and Authorized by:   Tresa Garter MD   Signed by:   Tresa Garter MD on 01/11/2010   Method used:   Faxed to ...       [facility] (mail-order)       [address] [place], [place]  [address]       Ph: [phone]       Fax: [phone]   RxID:   [id number] WELLBUTRIN XL 150 MG  TB24 (BUPROPION HCL) Take one tablet once daily  #90 x 2   Entered and Authorized by:   Tresa Garter MD   Signed by:   Tresa Garter MD on 01/11/2010   Method  used:   Faxed to ...       [facility] (mail-order)       [address] [place], [place]  [address]       Ph: [phone]       Fax: [phone]   RxID:   [id number] PRAVASTATIN SODIUM 40 MG TABS (PRAVASTATIN SODIUM) 1 by mouth once daily for cholesterol  #30 x 12   Entered and Authorized by:   Tresa Garter MD   Signed by:   Tresa Garter MD on 01/11/2010   Method used:   Print then Give to Patient   RxID:   [id number] RANITIDINE HCL 150 MG TABS (RANITIDINE HCL) 1 by mouth two times a day for indigestion  #60 x 12   Entered and Authorized by:   Tresa Garter MD   Signed by:   Tresa Garter MD on 01/11/2010   Method used:   Print then Give to Patient   RxID:   [id number] HYZAAR 100-25 MG TABS (LOSARTAN POTASSIUM-HCTZ) Take 1 tab by mouth daily  #90 x 3   Entered and Authorized by:   Tresa Garter MD   Signed by:   Tresa Garter MD on 01/11/2010   Method used:   Print then Give to Patient   RxID:   [id number] VANIQA 13.9 % CREA (EFLORNITHINE HCL)   #45 g x 3   Entered and Authorized by:   Tresa Garter MD   Signed by:   Tresa Garter MD on  01/11/2010   Method used:   Print then Give to Patient   RxID:   [id number] AMBIEN 10 MG  TABS (ZOLPIDEM TARTRATE) Take one tablet by mouth at bedtime as needed for insomnia  #90 x 1   Entered and Authorized by:   Tresa Garter MD   Signed by:   Tresa Garter MD on 01/11/2010   Method used:   Print then Give to Patient   RxID:   [id number] WELLBUTRIN XL 150 MG  TB24 (BUPROPION HCL) Take one tablet once daily  #90 x 2   Entered and Authorized by:   Tresa Garter MD   Signed by:   Tresa Garter MD on 01/11/2010   Method used:   Print then Give to Patient   RxID:   [id number] PENNSAID 1.5 % SOLN (DICLOFENAC SODIUM) 3-5 gtt on skin three times a day for pain  #1 x 3   Entered and Authorized by:   Tresa Garter MD   Signed by:    Tresa Garter MD on 01/11/2010   Method used:   Print then Give to Patient   RxID:   [id number]

## 2010-06-27 NOTE — Assessment & Plan Note (Signed)
Summary: 4 MTH YEARLY---STC   Vital Signs:  Patient profile:   77 year old female Height:      61.5 inches Weight:      140 pounds BMI:     26.12 Temp:     98.4 degrees F oral Pulse rate:   88 / minute Pulse rhythm:   regular Resp:     16 per minute BP sitting:   140 / 78  (left arm) Cuff size:   regular  Vitals Entered By: Lanier Prude, Beverly Gust) (May 13, 2010 10:43 AM) CC: MWV Is Patient Diabetic? No Comments pt is not taking Vaniqa, pravastating or Pennsaid.  She needs 90day supply  on Ranitidine   CC:  MWV.  History of Present Illness: The patient presents for a preventive health examination  Patient past medical history, social history, and family history reviewed in detail no significant changes.  Patient is physically active. Depression is treated and mood is good. Hearing is normal, and able to perform activities of daily living. Risk of falling is negligible and home safety has been reviewed and is appropriate. Patient has normal height, she is a little overweight, and visual acuity. Patient has been counseled on age-appropriate routine health concerns for screening and prevention. Education, counseling done. Cognition is nl. The patient presents for a follow up of abn. glu,  anxiety, depression and osteoporhosis  Preventive Screening-Counseling & Management  Alcohol-Tobacco     Alcohol drinks/day: 0     Smoking Status: never  Caffeine-Diet-Exercise     Caffeine use/day: 1     Does Patient Exercise: no     Depression Counseling: not indicated; screening negative for depression  Hep-HIV-STD-Contraception     Hepatitis Risk: no risk noted     Dental Visit-last 6 months yes     SBE monthly: no     Sun Exposure-Excessive: no  Safety-Violence-Falls     Seat Belt Use: yes     Helmet Use: n/a     Firearms in the Home: no firearms in the home     Smoke Detectors: yes     Violence in the Home: no risk noted     Sexual Abuse: no     Fall Risk: no   Sexual History:  currently monogamous.    Current Medications (verified): 1)  Wellbutrin Xl 150 Mg  Tb24 (Bupropion Hcl) .... Take One Tablet Once Daily 2)  Ambien 10 Mg  Tabs (Zolpidem Tartrate) .... Take One Tablet By Mouth At Bedtime As Needed For Insomnia 3)  Vitamin D3 1000 Unit  Tabs (Cholecalciferol) .... 2 Qd 4)  Vaniqa 13.9 % Crea (Eflornithine Hcl) 5)  Aspirin Ec 81 Mg Tbec (Aspirin) .... Once Daily 6)  Hyzaar 100-25 Mg Tabs (Losartan Potassium-Hctz) .... Take 1 Tab By Mouth Daily 7)  Meclizine Hcl 12.5 Mg Tabs (Meclizine Hcl) .Marland Kitchen.. 1-2 By Mouth Qid As Needed Vertigo 8)  Ranitidine Hcl 150 Mg Tabs (Ranitidine Hcl) .Marland Kitchen.. 1 By Mouth Two Times A Day For Indigestion 9)  Pravastatin Sodium 40 Mg Tabs (Pravastatin Sodium) .Marland Kitchen.. 1 By Mouth Once Daily For Cholesterol 10)  Pennsaid 1.5 % Soln (Diclofenac Sodium) .... 3-5 Gtt On Skin Three Times A Day For Pain 11)  Multivitamins  Tabs (Multiple Vitamin) .Marland Kitchen.. 1 By Mouth Once Daily 12)  Vitamin B-12 1000 Mcg Tabs (Cyanocobalamin) .Marland Kitchen.. 1 By Mouth Once Daily  Allergies (verified): 1)  Zocor (Simvastatin)  Past History:  Past Surgical History: Last updated: 03/24/2007 HYSTERECTOMY, HX OF (ICD-V45.77)  Family History:  Last updated: 05/07/2007 F cancer M renal failure  Social History: Last updated: 05/07/2007 Retired Married Never Smoked Alcohol use-no  Past Medical History: HEMANGIOMA, HEPATIC (ICD-228.04) Hx of RENAL CYST (ICD-593.2) SHINGLES, HX OF (ICD-V13.8) Hx of PARESTHESIA (ICD-782.0) HEART MURMUR, HX OF (ICD-V12.50) DYSLIPIDEMIA (ICD-272.4) GASTROESOPHAGEAL REFLUX DISEASE (ICD-530.81) OSTEOPOROSIS (ICD-733.00) INSOMNIA, HX OF (ICD-V15.89) DEPRESSION (ICD-311) HYPERTENSION (ICD-401.9) Gyn  Dr Orvilla Cornwall glu 2010   Low back pain GERD  Social History: Caffeine use/day:  1 Does Patient Exercise:  no Dental Care w/in 6 mos.:  yes Sun Exposure-Excessive:  no Seat Belt Use:  yes Fall Risk:  no Sexual History:   currently monogamous Hepatitis Risk:  no risk noted  Physical Exam  General:  NAD Head:  Normocephalic and atraumatic without obvious abnormalities. No apparent alopecia or balding. Ears:  WNL Nose:  nasal dischargemucosal pallor and mucosal erythema.   Mouth:  WNL Lungs:  Normal respiratory effort, chest expands symmetrically. Lungs are clear to auscultation, no crackles or wheezes. Heart:  Normal rate and regular rhythm. S1 and S2 normal without gallop, murmur, click, rub or other extra sounds. Abdomen:  Bowel sounds positive,abdomen soft and non-tender without masses, organomegaly or hernias noted. Msk:  R 4th finger is triggering Neurologic:  No cranial nerve deficits noted. Station and gait are normal. Plantar reflexes are down-going bilaterally. DTRs are symmetrical throughout. Sensory, motor and coordinative functions appear intact. Skin:  Intact without suspicious lesions or rashes Psych:  Cognition and judgment appear intact. Alert and cooperative with normal attention span and concentration. No apparent delusions, illusions, hallucinations   Impression & Recommendations:  Problem # 1:  HEALTH MAINTENANCE EXAM (ICD-V70.0) Assessment New  Overall doing well, age appropriate education and counseling updated and referral for appropriate preventive services done unless declined, immunizations up to date or declined, diet counseling done if overweight, urged to quit smoking if smokes, most recent labs reviewed and current ordered if appropriate, ecg reviewed or declined (interpretation per ECG scanned in the EMR if done); information regarding Medicare Preventation requirements given if appropriate.  Colon up to date BDS is pending with GYN Orders: TLB-BMP (Basic Metabolic Panel-BMET) (80048-METABOL) TLB-CBC Platelet - w/Differential (85025-CBCD) TLB-Hepatic/Liver Function Pnl (80076-HEPATIC) TLB-Lipid Panel (80061-LIPID) TLB-TSH (Thyroid Stimulating Hormone) (84443-TSH) TLB-Udip  ONLY (81003-UDIP) Medicare -1st Annual Wellness Visit [phone])  Problem # 2:  HYPERTENSION (ICD-401.9) Assessment: Unchanged  Her updated medication list for this problem includes:    Hyzaar 100-25 Mg Tabs (Losartan potassium-hctz) .Marland Kitchen... Take 1 tab by mouth daily  Problem # 3:  OSTEOPOROSIS (ICD-733.00) Assessment: Unchanged Vit D  Problem # 4:  LOW BACK PAIN (ICD-724.2) Assessment: Unchanged  Her updated medication list for this problem includes:    Aspirin Ec 81 Mg Tbec (Aspirin) ..... Once daily  Problem # 5:  GERD (ICD-530.81) Assessment: Unchanged  Her updated medication list for this problem includes:    Ranitidine Hcl 150 Mg Tabs (Ranitidine hcl) .Marland Kitchen... 1 by mouth two times a day for indigestion  Problem # 6:  DEPRESSION (ICD-311) Assessment: Improved Cont Rx. Discussed - she will cont Rx Her updated medication list for this problem includes:    Wellbutrin Xl 150 Mg Tb24 (Bupropion hcl) .Marland Kitchen... Take one tablet once daily  Complete Medication List: 1)  Wellbutrin Xl 150 Mg Tb24 (Bupropion hcl) .... Take one tablet once daily 2)  Ambien 10 Mg Tabs (Zolpidem tartrate) .... Take one tablet by mouth at bedtime as needed for insomnia 3)  Vitamin D3 1000 Unit Tabs (Cholecalciferol) .... 2 qd  4)  Vaniqa 13.9 % Crea (Eflornithine hcl) 5)  Aspirin Ec 81 Mg Tbec (Aspirin) .... Once daily 6)  Hyzaar 100-25 Mg Tabs (Losartan potassium-hctz) .... Take 1 tab by mouth daily 7)  Meclizine Hcl 12.5 Mg Tabs (Meclizine hcl) .Marland Kitchen.. 1-2 by mouth qid as needed vertigo 8)  Ranitidine Hcl 150 Mg Tabs (Ranitidine hcl) .Marland Kitchen.. 1 by mouth two times a day for indigestion 9)  Pravastatin Sodium 40 Mg Tabs (Pravastatin sodium) .Marland Kitchen.. 1 by mouth once daily for cholesterol 10)  Pennsaid 1.5 % Soln (Diclofenac sodium) .... 3-5 gtt on skin three times a day for pain 11)  Multivitamins Tabs (Multiple vitamin) .Marland Kitchen.. 1 by mouth once daily 12)  Vitamin B-12 1000 Mcg Tabs (Cyanocobalamin) .Marland Kitchen.. 1 by mouth once  daily  Other Orders: Influenza Vaccine MCR (04540)  Patient Instructions: 1)  Please schedule a follow-up appointment in 6 months. Prescriptions: RANITIDINE HCL 150 MG TABS (RANITIDINE HCL) 1 by mouth two times a day for indigestion  #180 x 3   Entered and Authorized by:   Tresa Garter MD   Signed by:   Tresa Garter MD on 05/13/2010   Method used:   Print then Give to Patient   RxID:   [id number]    Orders Added: 1)  TLB-BMP (Basic Metabolic Panel-BMET) [80048-METABOL] 2)  TLB-CBC Platelet - w/Differential [85025-CBCD] 3)  TLB-Hepatic/Liver Function Pnl [80076-HEPATIC] 4)  TLB-Lipid Panel [80061-LIPID] 5)  TLB-TSH (Thyroid Stimulating Hormone) [84443-TSH] 6)  TLB-Udip ONLY [81003-UDIP] 7)  Medicare -1st Annual Wellness Visit [G0438] 8)  Est. Patient Level IV [08657] 9)  Influenza Vaccine MCR [00025]   Immunization History:  Pneumovax Immunization History:    Pneumovax:  historical (02/10/2007)  Tetanus/Td Immunization History:    Tetanus/Td:  historical (12/13/1998)  Immunizations Administered:  Influenza Vaccine # 1:    Vaccine Type: Fluvax MCR    Site: left deltoid    Mfr: Sanofi Pasteur    Dose: 0.5 ml    Route: IM    Given by: Lanier Prude, CMA(AAMA)    Exp. Date: 11/23/2010    Lot #: [id number]    VIS given: 12/18/09 version given May 13, 2010.   Immunization History:  Pneumovax Immunization History:    Pneumovax:  Historical (02/10/2007)  Tetanus/Td Immunization History:    Tetanus/Td:  Historical (12/13/1998)  Immunizations Administered:  Influenza Vaccine # 1:    Vaccine Type: Fluvax MCR    Site: left deltoid    Mfr: Sanofi Pasteur    Dose: 0.5 ml    Route: IM    Given by: Lanier Prude, CMA(AAMA)    Exp. Date: 11/23/2010    Lot #: [id number]    VIS given: 12/18/09 version given May 13, 2010.

## 2010-07-22 ENCOUNTER — Other Ambulatory Visit: Payer: Medicare Other

## 2010-07-22 ENCOUNTER — Encounter: Payer: Self-pay | Admitting: Internal Medicine

## 2010-07-22 ENCOUNTER — Other Ambulatory Visit: Payer: Self-pay | Admitting: Internal Medicine

## 2010-07-22 ENCOUNTER — Ambulatory Visit (INDEPENDENT_AMBULATORY_CARE_PROVIDER_SITE_OTHER): Payer: Medicare Other | Admitting: Internal Medicine

## 2010-07-22 DIAGNOSIS — E785 Hyperlipidemia, unspecified: Secondary | ICD-10-CM

## 2010-07-22 DIAGNOSIS — M279 Disease of jaws, unspecified: Secondary | ICD-10-CM

## 2010-07-22 DIAGNOSIS — K219 Gastro-esophageal reflux disease without esophagitis: Secondary | ICD-10-CM

## 2010-07-22 DIAGNOSIS — I10 Essential (primary) hypertension: Secondary | ICD-10-CM

## 2010-07-22 DIAGNOSIS — Z0000 Encounter for general adult medical examination without abnormal findings: Secondary | ICD-10-CM

## 2010-07-22 DIAGNOSIS — M79609 Pain in unspecified limb: Secondary | ICD-10-CM

## 2010-07-22 LAB — URINALYSIS, ROUTINE W REFLEX MICROSCOPIC
Bilirubin Urine: NEGATIVE
Ketones, ur: NEGATIVE
Total Protein, Urine: NEGATIVE
Urine Glucose: NEGATIVE
pH: 6 (ref 5.0–8.0)

## 2010-07-22 LAB — HEPATIC FUNCTION PANEL
Albumin: 4.1 g/dL (ref 3.5–5.2)
Bilirubin, Direct: 0.1 mg/dL (ref 0.0–0.3)
Total Protein: 6.5 g/dL (ref 6.0–8.3)

## 2010-07-22 LAB — CBC WITH DIFFERENTIAL/PLATELET
Basophils Relative: 0.6 % (ref 0.0–3.0)
Eosinophils Absolute: 0.3 10*3/uL (ref 0.0–0.7)
MCHC: 33.7 g/dL (ref 30.0–36.0)
MCV: 85.8 fl (ref 78.0–100.0)
Monocytes Absolute: 0.8 10*3/uL (ref 0.1–1.0)
Neutro Abs: 4.5 10*3/uL (ref 1.4–7.7)
Neutrophils Relative %: 53.8 % (ref 43.0–77.0)
RBC: 4.88 Mil/uL (ref 3.87–5.11)

## 2010-07-22 LAB — LDL CHOLESTEROL, DIRECT: Direct LDL: 143 mg/dL

## 2010-07-22 LAB — LIPID PANEL
Cholesterol: 213 mg/dL — ABNORMAL HIGH (ref 0–200)
HDL: 52 mg/dL (ref >39.00)
Triglycerides: 198 mg/dL — ABNORMAL HIGH (ref 0.0–149.0)

## 2010-07-22 LAB — BASIC METABOLIC PANEL
CO2: 29 mEq/L (ref 19–32)
Chloride: 104 mEq/L (ref 96–112)
Creatinine, Ser: 0.6 mg/dL (ref 0.4–1.2)

## 2010-08-01 NOTE — Assessment & Plan Note (Signed)
Summary: TINGLING IN L ARM FOR SEVERAL DAYS / NWS   Vital Signs:  Patient profile:   77 year old female Height:      61.5 inches (156.21 cm) Weight:      142.25 pounds (64.66 kg) BMI:     26.54 O2 Sat:      95 % on Room air Temp:     98.0 degrees F (36.67 degrees C) oral Pulse rate:   82 / minute Resp:     14 per minute BP sitting:   136 / 72  (left arm) Cuff size:   regular  Vitals Entered By: Burnard Leigh CMA(AAMA) (July 22, 2010 8:29 AM)  O2 Flow:  Room air CC: Pt c/o tingling in Left arm from shoulder to hand x1 week.Pt had pain in left jaw last week.Pt states that acid reflux has been getting worse/sls,cma Is Patient Diabetic? No Comments Pt states she is no longer using Vaniqua and Pennsaid.   CC:  Pt c/o tingling in Left arm from shoulder to hand x1 week.Pt had pain in left jaw last week.Pt states that acid reflux has been getting worse/sls and cma.  History of Present Illness: The patient presents for a follow up of hypertension, elev glu, hyperlipidemia C/o tingling in L arm and L jaw discomfort and GERD off and on x 4 wks   Current Medications (verified): 1)  Wellbutrin Xl 150 Mg  Tb24 (Bupropion Hcl) .... Take One Tablet Once Daily 2)  Ambien 10 Mg  Tabs (Zolpidem Tartrate) .... Take One Tablet By Mouth At Bedtime As Needed For Insomnia 3)  Vitamin D3 1000 Unit  Tabs (Cholecalciferol) .... 2 Qd 4)  Vaniqa 13.9 % Crea (Eflornithine Hcl) 5)  Aspirin Ec 81 Mg Tbec (Aspirin) .... Once Daily 6)  Hyzaar 100-25 Mg Tabs (Losartan Potassium-Hctz) .... Take 1 Tab By Mouth Daily 7)  Meclizine Hcl 12.5 Mg Tabs (Meclizine Hcl) .Marland Kitchen.. 1-2 By Mouth Qid As Needed Vertigo 8)  Ranitidine Hcl 150 Mg Tabs (Ranitidine Hcl) .Marland Kitchen.. 1 By Mouth Two Times A Day For Indigestion 9)  Pravastatin Sodium 40 Mg Tabs (Pravastatin Sodium) .Marland Kitchen.. 1 By Mouth Once Daily For Cholesterol 10)  Pennsaid 1.5 % Soln (Diclofenac Sodium) .... 3-5 Gtt On Skin Three Times A Day For Pain 11)  Multivitamins  Tabs  (Multiple Vitamin) .Marland Kitchen.. 1 By Mouth Once Daily 12)  Vitamin B-12 1000 Mcg Tabs (Cyanocobalamin) .Marland Kitchen.. 1 By Mouth Once Daily  Allergies (verified): 1)  Zocor (Simvastatin)  Past History:  Past Medical History: Last updated: 05/13/2010 HEMANGIOMA, HEPATIC (ICD-228.04) Hx of RENAL CYST (ICD-593.2) SHINGLES, HX OF (ICD-V13.8) Hx of PARESTHESIA (ICD-782.0) HEART MURMUR, HX OF (ICD-V12.50) DYSLIPIDEMIA (ICD-272.4) GASTROESOPHAGEAL REFLUX DISEASE (ICD-530.81) OSTEOPOROSIS (ICD-733.00) INSOMNIA, HX OF (ICD-V15.89) DEPRESSION (ICD-311) HYPERTENSION (ICD-401.9) Gyn  Dr Orvilla Cornwall glu 2010   Low back pain GERD  Past Surgical History: Last updated: 03/24/2007 HYSTERECTOMY, HX OF (ICD-V45.77)  Social History: Last updated: 05/07/2007 Retired Married Never Smoked Alcohol use-no  Family History: F cancer M renal failure No CAD  Review of Systems  The patient denies fever, chest pain, dyspnea on exertion, abdominal pain, and melena.    Physical Exam  General:  NAD Head:  Normocephalic and atraumatic without obvious abnormalities. No apparent alopecia or balding. Nose:  nasal dischargemucosal pallor and mucosal erythema.   Mouth:  WNL Neck:  supple and no masses.   Lungs:  Normal respiratory effort, chest expands symmetrically. Lungs are clear to auscultation, no crackles or wheezes. Heart:  Normal  rate and regular rhythm. S1 and S2 normal without gallop, murmur, click, rub or other extra sounds. Abdomen:  Bowel sounds positive,abdomen soft and non-tender without masses, organomegaly or hernias noted. Msk:  Neck and B shoulders are NT Neurologic:  No cranial nerve deficits noted. Station and gait are normal. Plantar reflexes are down-going bilaterally. DTRs are symmetrical throughout. Sensory, motor and coordinative functions appear intact. Skin:  Intact without suspicious lesions or rashes Psych:  Cognition and judgment appear intact. Alert and cooperative with normal  attention span and concentration. No apparent delusions, illusions, hallucinations   Impression & Recommendations:  Problem # 1:  JAW PAIN (ICD-526.9) L atypical Assessment New We will sneed to sch CL stress test. She declined CL stress test Orders: EKG w/ Interpretation (93000) no acute changes  Problem # 2:  ARM PAIN (ICD-729.5) L atypical Assessment: New  Orders: EKG w/ Interpretation (93000)  Problem # 3:  FATIGUE (ICD-780.79) Assessment: Unchanged  Problem # 4:  HYPERTENSION (ICD-401.9) Assessment: Improved  Her updated medication list for this problem includes:    Hyzaar 100-25 Mg Tabs (Losartan potassium-hctz) .Marland Kitchen... Take 1 tab by mouth daily  BP today: 136/72 Prior BP: 140/78 (05/13/2010)  Labs Reviewed: K+: 4.3 (01/04/2010) Creat: : 0.8 (01/04/2010)   Chol: 221 (01/04/2010)   HDL: 51.00 (01/04/2010)   LDL: DEL (02/29/2008)   TG: 211.0 (01/04/2010)  Problem # 5:  GASTROESOPHAGEAL REFLUX DISEASE (ICD-530.81) Assessment: Unchanged  Her updated medication list for this problem includes:    Ranitidine Hcl 150 Mg Tabs (Ranitidine hcl) .Marland Kitchen... 1 by mouth two times a day for indigestion -- take daily Try Prilosec 20 mg 1 a day  Complete Medication List: 1)  Wellbutrin Xl 150 Mg Tb24 (Bupropion hcl) .... Take one tablet once daily 2)  Ambien 10 Mg Tabs (Zolpidem tartrate) .... Take one tablet by mouth at bedtime as needed for insomnia 3)  Vitamin D3 1000 Unit Tabs (Cholecalciferol) .... 2 qd 4)  Vaniqa 13.9 % Crea (Eflornithine hcl) 5)  Aspirin Ec 81 Mg Tbec (Aspirin) .... Once daily 6)  Hyzaar 100-25 Mg Tabs (Losartan potassium-hctz) .... Take 1 tab by mouth daily 7)  Meclizine Hcl 12.5 Mg Tabs (Meclizine hcl) .Marland Kitchen.. 1-2 by mouth qid as needed vertigo 8)  Ranitidine Hcl 150 Mg Tabs (Ranitidine hcl) .Marland Kitchen.. 1 by mouth two times a day for indigestion 9)  Pravastatin Sodium 40 Mg Tabs (Pravastatin sodium) .Marland Kitchen.. 1 by mouth once daily for cholesterol 10)  Pennsaid 1.5 % Soln  (Diclofenac sodium) .... 3-5 gtt on skin three times a day for pain 11)  Multivitamins Tabs (Multiple vitamin) .Marland Kitchen.. 1 by mouth once daily 12)  Vitamin B-12 1000 Mcg Tabs (Cyanocobalamin) .Marland Kitchen.. 1 by mouth once daily  Other Orders: TLB-BMP (Basic Metabolic Panel-BMET) (80048-METABOL) TLB-CBC Platelet - w/Differential (85025-CBCD) TLB-Hepatic/Liver Function Pnl (80076-HEPATIC) TLB-Lipid Panel (80061-LIPID) TLB-TSH (Thyroid Stimulating Hormone) (84443-TSH) TLB-Udip ONLY (81003-UDIP)  Patient Instructions: 1)  Please schedule a follow-up appointment in 3 months. 2)  Go to ER if  bad chest pain!    Orders Added: 1)  EKG w/ Interpretation [93000] 2)  Est. Patient Level IV [16109] 3)  TLB-BMP (Basic Metabolic Panel-BMET) [80048-METABOL] 4)  TLB-CBC Platelet - w/Differential [85025-CBCD] 5)  TLB-Hepatic/Liver Function Pnl [80076-HEPATIC] 6)  TLB-Lipid Panel [80061-LIPID] 7)  TLB-TSH (Thyroid Stimulating Hormone) [84443-TSH] 8)  TLB-Udip ONLY [81003-UDIP]

## 2010-10-22 ENCOUNTER — Ambulatory Visit (INDEPENDENT_AMBULATORY_CARE_PROVIDER_SITE_OTHER): Payer: Medicare Other | Admitting: Internal Medicine

## 2010-10-22 ENCOUNTER — Encounter: Payer: Self-pay | Admitting: Internal Medicine

## 2010-10-22 DIAGNOSIS — R7309 Other abnormal glucose: Secondary | ICD-10-CM

## 2010-10-22 DIAGNOSIS — E785 Hyperlipidemia, unspecified: Secondary | ICD-10-CM

## 2010-10-22 DIAGNOSIS — R42 Dizziness and giddiness: Secondary | ICD-10-CM

## 2010-10-22 DIAGNOSIS — F329 Major depressive disorder, single episode, unspecified: Secondary | ICD-10-CM

## 2010-10-22 NOTE — Assessment & Plan Note (Signed)
Check labs 

## 2010-10-22 NOTE — Assessment & Plan Note (Signed)
On Rx 

## 2010-10-22 NOTE — Assessment & Plan Note (Signed)
Better - cont Rx 

## 2010-10-22 NOTE — Assessment & Plan Note (Signed)
Better  

## 2010-10-22 NOTE — Progress Notes (Signed)
  Subjective:    Patient ID: Cynthia Garza, female    DOB: 1933/05/17, 77 y.o.   MRN: [id number]  HPI    Review of Systems  Constitutional: Negative for chills.  HENT: Negative for congestion and dental problem.   Eyes: Negative for pain.  Respiratory: Negative for choking.   Gastrointestinal: Negative for abdominal pain.  Genitourinary: Negative for urgency and genital sores.  Neurological: Negative for tremors, syncope and weakness.  Psychiatric/Behavioral: Negative for suicidal ideas and confusion. The patient is not nervous/anxious and is not hyperactive.        Objective:   Physical Exam  Constitutional: She appears well-developed and well-nourished. No distress.  HENT:  Head: Normocephalic.  Right Ear: External ear normal.  Left Ear: External ear normal.  Nose: Nose normal.  Mouth/Throat: Oropharynx is clear and moist.  Eyes: Conjunctivae are normal. Pupils are equal, round, and reactive to light. Right eye exhibits no discharge. Left eye exhibits no discharge.  Neck: Normal range of motion. Neck supple. No JVD present. No tracheal deviation present. No thyromegaly present.  Cardiovascular: Normal rate, regular rhythm and normal heart sounds.   Pulmonary/Chest: No stridor. No respiratory distress. She has no wheezes.  Abdominal: Soft. Bowel sounds are normal. She exhibits no distension and no mass. There is no tenderness. There is no rebound and no guarding.  Musculoskeletal: She exhibits no edema and no tenderness.  Lymphadenopathy:    She has no cervical adenopathy.  Neurological: She displays normal reflexes. No cranial nerve deficit. She exhibits normal muscle tone. Coordination normal.  Skin: No rash noted. No erythema.  Psychiatric: She has a normal mood and affect. Her behavior is normal. Judgment and thought content normal.       Not depressed          Assessment & Plan:  ABNORMAL GLUCOSE NEC Check labs  DYSLIPIDEMIA On Rx  DEPRESSION Better -  cont Rx  VERTIGO Better    RTC 3 mo

## 2010-10-25 ENCOUNTER — Other Ambulatory Visit: Payer: Self-pay | Admitting: *Deleted

## 2010-10-25 MED ORDER — LOSARTAN POTASSIUM-HCTZ 100-25 MG PO TABS: 1.0000 | ORAL_TABLET | Freq: Every day | ORAL | Status: DC

## 2010-10-25 MED ORDER — BUPROPION HCL ER (XL) 150 MG PO TB24: 150.0000 mg | ORAL_TABLET | Freq: Every day | ORAL | Status: DC

## 2011-01-02 ENCOUNTER — Other Ambulatory Visit (INDEPENDENT_AMBULATORY_CARE_PROVIDER_SITE_OTHER): Payer: Medicare Other

## 2011-01-02 ENCOUNTER — Encounter: Payer: Self-pay | Admitting: Internal Medicine

## 2011-01-02 ENCOUNTER — Ambulatory Visit (INDEPENDENT_AMBULATORY_CARE_PROVIDER_SITE_OTHER): Payer: Medicare Other | Admitting: Internal Medicine

## 2011-01-02 DIAGNOSIS — I10 Essential (primary) hypertension: Secondary | ICD-10-CM

## 2011-01-02 DIAGNOSIS — E785 Hyperlipidemia, unspecified: Secondary | ICD-10-CM

## 2011-01-02 DIAGNOSIS — F3289 Other specified depressive episodes: Secondary | ICD-10-CM

## 2011-01-02 DIAGNOSIS — R42 Dizziness and giddiness: Secondary | ICD-10-CM

## 2011-01-02 DIAGNOSIS — R7309 Other abnormal glucose: Secondary | ICD-10-CM

## 2011-01-02 DIAGNOSIS — IMO0001 Reserved for inherently not codable concepts without codable children: Secondary | ICD-10-CM

## 2011-01-02 DIAGNOSIS — F329 Major depressive disorder, single episode, unspecified: Secondary | ICD-10-CM

## 2011-01-02 LAB — COMPREHENSIVE METABOLIC PANEL
ALT: 10 U/L (ref 0–35)
Albumin: 4.4 g/dL (ref 3.5–5.2)
Alkaline Phosphatase: 56 U/L (ref 39–117)
CO2: 31 mEq/L (ref 19–32)
Potassium: 4.2 mEq/L (ref 3.5–5.1)
Sodium: 140 mEq/L (ref 135–145)
Total Bilirubin: 0.7 mg/dL (ref 0.3–1.2)
Total Protein: 7.5 g/dL (ref 6.0–8.3)

## 2011-01-02 NOTE — Assessment & Plan Note (Signed)
resolved 

## 2011-01-02 NOTE — Assessment & Plan Note (Signed)
On Rx 

## 2011-01-02 NOTE — Progress Notes (Signed)
  Subjective:    Patient ID: Cynthia Garza, female    DOB: July 19, 1932, 77 y.o.   MRN: [id number]  HPI   The patient is here to follow up on chronic depression, anxiety, headaches and chronic stress symptoms controlled with medicines   Review of Systems  Constitutional: Negative for chills, activity change, appetite change, fatigue and unexpected weight change.  HENT: Negative for congestion, mouth sores and sinus pressure.   Eyes: Negative for visual disturbance.  Respiratory: Negative for cough and chest tightness.   Gastrointestinal: Negative for nausea and abdominal pain.  Genitourinary: Negative for frequency, difficulty urinating and vaginal pain.  Musculoskeletal: Negative for back pain and gait problem.  Skin: Negative for pallor and rash.  Neurological: Negative for dizziness, tremors, weakness, numbness and headaches.  Psychiatric/Behavioral: Positive for sleep disturbance. Negative for suicidal ideas and confusion. The patient is nervous/anxious.        Objective:   Physical Exam  Constitutional: She appears well-developed and well-nourished. No distress.  HENT:  Head: Normocephalic.  Right Ear: External ear normal.  Left Ear: External ear normal.  Nose: Nose normal.  Mouth/Throat: Oropharynx is clear and moist.  Eyes: Conjunctivae are normal. Pupils are equal, round, and reactive to light. Right eye exhibits no discharge. Left eye exhibits no discharge.  Neck: Normal range of motion. Neck supple. No JVD present. No tracheal deviation present. No thyromegaly present.  Cardiovascular: Normal rate, regular rhythm and normal heart sounds.   Pulmonary/Chest: No stridor. No respiratory distress. She has no wheezes.  Abdominal: Soft. Bowel sounds are normal. She exhibits no distension and no mass. There is no tenderness. There is no rebound and no guarding.  Musculoskeletal: She exhibits no edema and no tenderness.  Lymphadenopathy:    She has no cervical adenopathy.    Neurological: She displays normal reflexes. No cranial nerve deficit. She exhibits normal muscle tone. Coordination normal.  Skin: No rash noted. No erythema.  Psychiatric: Her behavior is normal. Judgment and thought content normal.       Tense          Assessment & Plan:

## 2011-01-02 NOTE — Assessment & Plan Note (Signed)
  On diet  

## 2011-02-14 ENCOUNTER — Other Ambulatory Visit: Payer: Self-pay | Admitting: *Deleted

## 2011-02-14 MED ORDER — MECLIZINE HCL 12.5 MG PO TABS: 12.5000 mg | ORAL_TABLET | Freq: Four times a day (QID) | ORAL | Status: DC | PRN

## 2011-05-08 ENCOUNTER — Encounter: Payer: Self-pay | Admitting: Internal Medicine

## 2011-05-08 ENCOUNTER — Ambulatory Visit (INDEPENDENT_AMBULATORY_CARE_PROVIDER_SITE_OTHER): Payer: Medicare Other | Admitting: Internal Medicine

## 2011-05-08 DIAGNOSIS — F3289 Other specified depressive episodes: Secondary | ICD-10-CM

## 2011-05-08 DIAGNOSIS — R5381 Other malaise: Secondary | ICD-10-CM

## 2011-05-08 DIAGNOSIS — M545 Low back pain, unspecified: Secondary | ICD-10-CM

## 2011-05-08 DIAGNOSIS — M79609 Pain in unspecified limb: Secondary | ICD-10-CM

## 2011-05-08 DIAGNOSIS — F329 Major depressive disorder, single episode, unspecified: Secondary | ICD-10-CM

## 2011-05-08 DIAGNOSIS — E785 Hyperlipidemia, unspecified: Secondary | ICD-10-CM

## 2011-05-08 MED ORDER — VALSARTAN-HYDROCHLOROTHIAZIDE 160-25 MG PO TABS: 1.0000 | ORAL_TABLET | Freq: Every day | ORAL | Status: DC

## 2011-05-08 NOTE — Assessment & Plan Note (Signed)
resolved 

## 2011-05-08 NOTE — Assessment & Plan Note (Signed)
Resolved off zocor 

## 2011-05-08 NOTE — Progress Notes (Signed)
  Subjective:    Patient ID: Cynthia Garza, female    DOB: 07/08/1932, 77 y.o.   MRN: [id number]  HPI   The patient is here to follow up on chronic depression, anxiety, HTN and chronic moderate fatigue symptoms controlled with medicines, diet and exercise.   Review of Systems  Constitutional: Negative for chills, activity change, appetite change, fatigue and unexpected weight change.  HENT: Negative for congestion, mouth sores and sinus pressure.   Eyes: Negative for visual disturbance.  Respiratory: Negative for cough and chest tightness.   Gastrointestinal: Negative for nausea and abdominal pain.  Genitourinary: Negative for frequency, difficulty urinating and vaginal pain.  Musculoskeletal: Negative for back pain and gait problem.  Skin: Negative for pallor and rash.  Neurological: Negative for dizziness, tremors, weakness, numbness and headaches.  Psychiatric/Behavioral: Negative for confusion and sleep disturbance. The patient is not nervous/anxious.        Objective:   Physical Exam  Constitutional: She appears well-developed and well-nourished. No distress.  HENT:  Head: Normocephalic.  Right Ear: External ear normal.  Left Ear: External ear normal.  Nose: Nose normal.  Mouth/Throat: Oropharynx is clear and moist.  Eyes: Conjunctivae are normal. Pupils are equal, round, and reactive to light. Right eye exhibits no discharge. Left eye exhibits no discharge.  Neck: Normal range of motion. Neck supple. No JVD present. No tracheal deviation present. No thyromegaly present.  Cardiovascular: Normal rate, regular rhythm and normal heart sounds.   Pulmonary/Chest: No stridor. No respiratory distress. She has no wheezes.  Abdominal: Soft. Bowel sounds are normal. She exhibits no distension and no mass. There is no tenderness. There is no rebound and no guarding.  Musculoskeletal: She exhibits no edema and no tenderness.  Lymphadenopathy:    She has no cervical adenopathy.    Neurological: She displays normal reflexes. No cranial nerve deficit. She exhibits normal muscle tone. Coordination normal.  Skin: No rash noted. No erythema.  Psychiatric: She has a normal mood and affect. Her behavior is normal. Judgment and thought content normal.    Lab Results  Component Value Date   WBC 8.4 07/22/2010   HGB 14.1 07/22/2010   HCT 41.9 07/22/2010   PLT 229.0 07/22/2010   GLUCOSE 99 01/02/2011   CHOL 213* 07/22/2010   TRIG 198.0* 07/22/2010   HDL 52.00 07/22/2010   LDLDIRECT 143.0 07/22/2010   LDLCALC 96 04/26/2007   ALT 10 01/02/2011   AST 17 01/02/2011   NA 140 01/02/2011   K 4.2 01/02/2011   CL 101 01/02/2011   CREATININE 0.9 01/02/2011   BUN 20 01/02/2011   CO2 31 01/02/2011   TSH 1.20 07/22/2010   HGBA1C 5.7 01/02/2011         Assessment & Plan:

## 2011-05-08 NOTE — Assessment & Plan Note (Signed)
Better - cont rx

## 2011-05-08 NOTE — Assessment & Plan Note (Signed)
Better  

## 2011-05-08 NOTE — Assessment & Plan Note (Signed)
  On diet  

## 2011-06-23 DIAGNOSIS — N63 Unspecified lump in unspecified breast: Secondary | ICD-10-CM | POA: Diagnosis not present

## 2011-06-26 DIAGNOSIS — R928 Other abnormal and inconclusive findings on diagnostic imaging of breast: Secondary | ICD-10-CM | POA: Diagnosis not present

## 2011-07-14 ENCOUNTER — Other Ambulatory Visit: Payer: Self-pay | Admitting: *Deleted

## 2011-07-14 MED ORDER — RANITIDINE HCL 150 MG PO CAPS: 150.0000 mg | ORAL_CAPSULE | Freq: Two times a day (BID) | ORAL | Status: DC

## 2011-07-29 DIAGNOSIS — Z124 Encounter for screening for malignant neoplasm of cervix: Secondary | ICD-10-CM | POA: Diagnosis not present

## 2011-10-25 ENCOUNTER — Other Ambulatory Visit: Payer: Self-pay | Admitting: Internal Medicine

## 2011-11-04 ENCOUNTER — Encounter: Payer: Self-pay | Admitting: Internal Medicine

## 2011-11-04 ENCOUNTER — Ambulatory Visit (INDEPENDENT_AMBULATORY_CARE_PROVIDER_SITE_OTHER): Payer: Medicare Other | Admitting: Internal Medicine

## 2011-11-04 VITALS — BP 138/82 | HR 81 | Temp 97.1°F | Resp 16 | Wt 139.0 lb

## 2011-11-04 DIAGNOSIS — R5381 Other malaise: Secondary | ICD-10-CM | POA: Diagnosis not present

## 2011-11-04 DIAGNOSIS — K219 Gastro-esophageal reflux disease without esophagitis: Secondary | ICD-10-CM

## 2011-11-04 DIAGNOSIS — I10 Essential (primary) hypertension: Secondary | ICD-10-CM | POA: Diagnosis not present

## 2011-11-04 DIAGNOSIS — F329 Major depressive disorder, single episode, unspecified: Secondary | ICD-10-CM | POA: Diagnosis not present

## 2011-11-04 DIAGNOSIS — G4700 Insomnia, unspecified: Secondary | ICD-10-CM | POA: Insufficient documentation

## 2011-11-04 DIAGNOSIS — R5383 Other fatigue: Secondary | ICD-10-CM

## 2011-11-04 MED ORDER — OMEPRAZOLE 40 MG PO CPDR: 40.0000 mg | DELAYED_RELEASE_CAPSULE | Freq: Every day | ORAL | Status: DC

## 2011-11-04 NOTE — Assessment & Plan Note (Signed)
Continue with current prescription therapy as reflected on the Med list.  

## 2011-11-04 NOTE — Progress Notes (Signed)
   Subjective:    Patient ID: Cynthia Garza, female    DOB: 15-Oct-1932, 78 y.o.   MRN: [id number]  HPI   The patient is here to follow up on chronic depression, anxiety, HTN and chronic insomnia symptoms controlled with medicines, diet and exercise. C/o GERD - worse  BP Readings from Last 3 Encounters:  11/04/11 138/82  05/08/11 130/82  01/02/11 128/80   Wt Readings from Last 3 Encounters:  11/04/11 139 lb (63.05 kg)  05/08/11 139 lb (63.05 kg)  01/02/11 139 lb (63.05 kg)       Review of Systems  Constitutional: Negative for chills, activity change, appetite change, fatigue and unexpected weight change.  HENT: Negative for congestion, mouth sores and sinus pressure.   Eyes: Negative for visual disturbance.  Respiratory: Negative for cough and chest tightness.   Gastrointestinal: Negative for nausea and abdominal pain.  Genitourinary: Negative for frequency, difficulty urinating and vaginal pain.  Musculoskeletal: Negative for back pain and gait problem.  Skin: Negative for pallor and rash.  Neurological: Negative for dizziness, tremors, weakness, numbness and headaches.  Psychiatric/Behavioral: Negative for confusion and sleep disturbance. The patient is not nervous/anxious.        Objective:   Physical Exam  Constitutional: She appears well-developed and well-nourished. No distress.  HENT:  Head: Normocephalic.  Right Ear: External ear normal.  Left Ear: External ear normal.  Nose: Nose normal.  Mouth/Throat: Oropharynx is clear and moist.  Eyes: Conjunctivae are normal. Pupils are equal, round, and reactive to light. Right eye exhibits no discharge. Left eye exhibits no discharge.  Neck: Normal range of motion. Neck supple. No JVD present. No tracheal deviation present. No thyromegaly present.  Cardiovascular: Normal rate, regular rhythm and normal heart sounds.   Pulmonary/Chest: No stridor. No respiratory distress. She has no wheezes.  Abdominal: Soft. Bowel  sounds are normal. She exhibits no distension and no mass. There is no tenderness. There is no rebound and no guarding.  Musculoskeletal: She exhibits no edema and no tenderness.  Lymphadenopathy:    She has no cervical adenopathy.  Neurological: She displays normal reflexes. No cranial nerve deficit. She exhibits normal muscle tone. Coordination normal.  Skin: No rash noted. No erythema.  Psychiatric: She has a normal mood and affect. Her behavior is normal. Judgment and thought content normal.    Lab Results  Component Value Date   WBC 8.4 07/22/2010   HGB 14.1 07/22/2010   HCT 41.9 07/22/2010   PLT 229.0 07/22/2010   GLUCOSE 99 01/02/2011   CHOL 213* 07/22/2010   TRIG 198.0* 07/22/2010   HDL 52.00 07/22/2010   LDLDIRECT 143.0 07/22/2010   LDLCALC 96 04/26/2007   ALT 10 01/02/2011   AST 17 01/02/2011   NA 140 01/02/2011   K 4.2 01/02/2011   CL 101 01/02/2011   CREATININE 0.9 01/02/2011   BUN 20 01/02/2011   CO2 31 01/02/2011   TSH 1.20 07/22/2010   HGBA1C 5.7 01/02/2011         Assessment & Plan:

## 2011-11-13 ENCOUNTER — Other Ambulatory Visit: Payer: Self-pay | Admitting: Internal Medicine

## 2011-12-03 ENCOUNTER — Encounter: Payer: Self-pay | Admitting: Internal Medicine

## 2011-12-03 ENCOUNTER — Other Ambulatory Visit (INDEPENDENT_AMBULATORY_CARE_PROVIDER_SITE_OTHER): Payer: Medicare Other

## 2011-12-03 ENCOUNTER — Ambulatory Visit (INDEPENDENT_AMBULATORY_CARE_PROVIDER_SITE_OTHER)
Admission: RE | Admit: 2011-12-03 | Discharge: 2011-12-03 | Disposition: A | Discharge status: Home / Self Care | Payer: Medicare Other | Source: Ambulatory Visit | Attending: Internal Medicine | Admitting: Internal Medicine

## 2011-12-03 ENCOUNTER — Ambulatory Visit (INDEPENDENT_AMBULATORY_CARE_PROVIDER_SITE_OTHER): Payer: Medicare Other | Admitting: Internal Medicine

## 2011-12-03 ENCOUNTER — Telehealth: Payer: Self-pay | Admitting: Internal Medicine

## 2011-12-03 ENCOUNTER — Telehealth: Payer: Self-pay | Admitting: *Deleted

## 2011-12-03 VITALS — BP 120/52 | HR 108 | Temp 99.5°F | Wt 140.8 lb

## 2011-12-03 DIAGNOSIS — N390 Urinary tract infection, site not specified: Secondary | ICD-10-CM

## 2011-12-03 DIAGNOSIS — R112 Nausea with vomiting, unspecified: Secondary | ICD-10-CM

## 2011-12-03 DIAGNOSIS — R109 Unspecified abdominal pain: Secondary | ICD-10-CM

## 2011-12-03 DIAGNOSIS — D72829 Elevated white blood cell count, unspecified: Secondary | ICD-10-CM

## 2011-12-03 DIAGNOSIS — K5900 Constipation, unspecified: Secondary | ICD-10-CM | POA: Diagnosis not present

## 2011-12-03 LAB — POCT URINALYSIS DIPSTICK
Bilirubin, UA: NEGATIVE
Glucose, UA: NEGATIVE
Spec Grav, UA: 1.025
pH, UA: 5

## 2011-12-03 LAB — CBC WITH DIFFERENTIAL/PLATELET
Basophils Absolute: 0.1 10*3/uL (ref 0.0–0.1)
Eosinophils Relative: 0 % (ref 0.0–5.0)
HCT: 41.5 % (ref 36.0–46.0)
Lymphocytes Relative: 5.9 % — ABNORMAL LOW (ref 12.0–46.0)
Monocytes Relative: 8.2 % (ref 3.0–12.0)
Platelets: 209 10*3/uL (ref 150.0–400.0)
RDW: 14.2 % (ref 11.5–14.6)
WBC: 23.2 10*3/uL (ref 4.5–10.5)

## 2011-12-03 LAB — BASIC METABOLIC PANEL
BUN: 28 mg/dL — ABNORMAL HIGH (ref 6–23)
CO2: 29 mEq/L (ref 19–32)
Calcium: 9.1 mg/dL (ref 8.4–10.5)
GFR: 41.86 mL/min — ABNORMAL LOW (ref >60.00)
Glucose, Bld: 166 mg/dL — ABNORMAL HIGH (ref 70–99)
Potassium: 3.6 mEq/L (ref 3.5–5.1)
Sodium: 135 mEq/L (ref 135–145)

## 2011-12-03 LAB — HEPATIC FUNCTION PANEL
AST: 33 U/L (ref 0–37)
Albumin: 4 g/dL (ref 3.5–5.2)
Alkaline Phosphatase: 54 U/L (ref 39–117)
Total Protein: 7.5 g/dL (ref 6.0–8.3)

## 2011-12-03 MED ORDER — METRONIDAZOLE 500 MG PO TABS: 500.0000 mg | ORAL_TABLET | Freq: Three times a day (TID) | ORAL | Status: DC

## 2011-12-03 MED ORDER — CIPROFLOXACIN HCL 500 MG PO TABS: 500.0000 mg | ORAL_TABLET | Freq: Two times a day (BID) | ORAL | Status: DC

## 2011-12-03 MED ORDER — ONDANSETRON HCL 4 MG PO TABS: 4.0000 mg | ORAL_TABLET | Freq: Three times a day (TID) | ORAL | Status: DC | PRN

## 2011-12-03 NOTE — Telephone Encounter (Signed)
See result note on lab section - i called pt at home - spoke with spouse re: labs - tol cipro+flagyl thus far, no increase abd pain or nausea and vomiting -  CT a/p with CM orderd - [facility] will call to schedule same in AM -  pt spouse will take pt to ER if pt worse before then

## 2011-12-03 NOTE — Telephone Encounter (Signed)
Needs an OV today w/avail MD Thx

## 2011-12-03 NOTE — Telephone Encounter (Signed)
See below

## 2011-12-03 NOTE — Telephone Encounter (Signed)
Received call from [facility] in the lab. Critical white count @ 23.0..MD has already left pls advise.Cynthia KitchenMarland KitchenMarland Kitchen7/10/13@4 :35pm

## 2011-12-03 NOTE — Telephone Encounter (Signed)
Sch advised to add to VAL schedule today. Okay'd per VAL

## 2011-12-03 NOTE — Patient Instructions (Addendum)
It was good to see you today. Test(s) ordered today. Your results will be called to you after review (24-48 hours after test completion). If any changes need to be made, you will be notified at that time. Cipro 2x/day x 1 week and Flagyl 3x/day x 1 week (2 kinds of antibiotics) - start both tonight Zofran if needed for nausea or vomiting -  Your prescription(s) have been submitted to your pharmacy. Please take as directed and contact our office if you believe you are having problem(s) with the medication(s). if your symptoms continue to worsen (pain, fever, etc), or if you are unable take anything by mouth (pills, fluids, etc), you should go to the emergency room for further evaluation and treatment. B.R.A.T. Diet Your doctor has recommended the B.R.A.T. diet for you or your child until the condition improves. This is often used to help control diarrhea and vomiting symptoms. If you or your child can tolerate clear liquids, you may have:  Bananas.   Rice.   Applesauce.   Toast (and other simple starches such as crackers, potatoes, noodles).  Be sure to avoid dairy products, meats, and fatty foods until symptoms are better. Fruit juices such as apple, grape, and prune juice can make diarrhea worse. Avoid these. Continue this diet for 2 days or as instructed by your caregiver. Document Released: 05/12/2005 Document Revised: 05/01/2011 Document Reviewed: 10/29/2006 [facility] Patient Information 2012 [place], [place].

## 2011-12-03 NOTE — Telephone Encounter (Signed)
Please advise on appt for this pt, thanks!

## 2011-12-03 NOTE — Progress Notes (Signed)
Subjective:    Patient ID: Cynthia Garza, female    DOB: 13-Apr-1933, 77 y.o.   MRN: [id number]  HPI  See CC above and ROS below Onset symptoms last 48h Denies med change or recent travel - no sick contacts  Past Medical History  Diagnosis Date  . Hemangioma of intra-abdominal structures   . Renal cyst   . Shingles   . Paresthesia   . Heart murmur   . Dyslipidemia   . GERD (gastroesophageal reflux disease)   . Osteoporosis   . Insomnia   . Depression   . HTN (hypertension)   . LBP (low back pain)     Review of Systems  Constitutional: Positive for fever, chills, appetite change and fatigue. Negative for unexpected weight change.  HENT: Negative for sore throat, rhinorrhea, sneezing, neck stiffness and sinus pressure.   Respiratory: Negative for cough and shortness of breath.   Cardiovascular: Negative for chest pain, palpitations and leg swelling.  Gastrointestinal: Positive for nausea, vomiting (x 24h with any po intake), abdominal pain and constipation (no BM x 4days (not usual)). Negative for diarrhea and abdominal distention.  Genitourinary: Positive for decreased urine volume, difficulty urinating and pelvic pain. Negative for urgency, frequency, hematuria and flank pain.  Musculoskeletal: Positive for myalgias. Negative for back pain and joint swelling.  Skin: Negative for rash and wound.  Neurological: Positive for weakness. Negative for light-headedness and headaches.       Objective:   Physical Exam BP 120/52  Pulse 108  Temp 99.5 F (37.5 C) (Oral)  Wt 140 lb 12.8 oz (63.866 kg)  SpO2 94% Wt Readings from Last 3 Encounters:  12/03/11 140 lb 12.8 oz (63.866 kg)  11/04/11 139 lb (63.05 kg)  05/08/11 139 lb (63.05 kg)   Constitutional: She appears well-developed and well-nourished. No acute distress, but mod ill and fatigued. Spouse at side HENT: Head: Normocephalic and atraumatic. Ears: B TMs ok, no erythema or effusion; Nose: Nose normal.  Mouth/Throat: Oropharynx is clear and moist. No oropharyngeal exudate.  Eyes: Conjunctivae and EOM are normal. Pupils are equal, round, and reactive to light. No scleral icterus.  Neck: Normal range of motion. Neck supple. No JVD or LAD present. No thyromegaly present.  Cardiovascular: fast rate but regular rhythm, normal heart sounds.  No murmur heard. No BLE edema. Pulmonary/Chest: Effort normal and breath sounds normal. No respiratory distress. She has no wheezes.  Abdominal: Soft. Bowel sounds are diminished if present. She exhibits no distension. There is no tenderness, rebound or gaurding. no masses Neurological: She is alert and oriented to person, place, and time. No cranial nerve deficit. Coordination normal.  Skin: Skin is warm and dry. No rash noted. No erythema.  Psychiatric: her behavior is normal. Judgment and thought content normal.   Lab Results  Component Value Date   WBC 8.4 07/22/2010   HGB 14.1 07/22/2010   HCT 41.9 07/22/2010   PLT 229.0 07/22/2010   GLUCOSE 99 01/02/2011   CHOL 213* 07/22/2010   TRIG 198.0* 07/22/2010   HDL 52.00 07/22/2010   LDLDIRECT 143.0 07/22/2010   LDLCALC 96 04/26/2007   ALT 10 01/02/2011   AST 17 01/02/2011   NA 140 01/02/2011   K 4.2 01/02/2011   CL 101 01/02/2011   CREATININE 0.9 01/02/2011   BUN 20 01/02/2011   CO2 31 01/02/2011   TSH 1.20 07/22/2010   HGBA1C 5.7 01/02/2011       Assessment & Plan:  UTI - +Udip on POC - ?PN  nausea and vomiting x 24h abdominal pain x 48h - ?pSBO ?diverticulitis  Check KUB and labs now Start antibody - cipro + flagyl zofran prn n/v ER if worse

## 2011-12-03 NOTE — Telephone Encounter (Signed)
Pt's daughter has called again regarding appt, please advise.

## 2011-12-03 NOTE — Telephone Encounter (Signed)
Chart reviewed, pt on cipro/flagy start today;  Dr Felicity Coyer to review but suspect CT may be warranted in the event of such elevated WBC

## 2011-12-03 NOTE — Telephone Encounter (Signed)
Caller: Cynthia Garza/daughter; PCP: Cynthia Garza; CB#: [phone]; Call regarding Vomiting, back pain, leg pain, stomach pain onset 07/09.  No vomiting since 07/09.    Is only passing a small amount of urine at a time and has noticed increased frequency and urgency.   Back pain is in middle of back, denies flank pain or blood in urine.  Afebrile.  Emergent sx negative.  Care advice per "Urinary Symptoms, Female" with appt advised within 24h due to "has one or more urinary tract symptoms, and has not been previously evaluated."   No available appts in EPIC.   OFFICE PLEASE CALL Cynthia Garza OR [name] FOR ASSISTANCE WITH APPT. THANKS.

## 2011-12-04 ENCOUNTER — Encounter (HOSPITAL_COMMUNITY): Payer: Self-pay | Admitting: *Deleted

## 2011-12-04 ENCOUNTER — Inpatient Hospital Stay (HOSPITAL_COMMUNITY)
Admission: EM | Admit: 2011-12-04 | Discharge: 2011-12-07 | DRG: 690 | Disposition: A | Discharge status: Home / Self Care | Payer: Medicare Other | Attending: Family Medicine | Admitting: Family Medicine

## 2011-12-04 ENCOUNTER — Other Ambulatory Visit: Payer: Self-pay | Admitting: *Deleted

## 2011-12-04 ENCOUNTER — Ambulatory Visit (INDEPENDENT_AMBULATORY_CARE_PROVIDER_SITE_OTHER)
Admission: RE | Admit: 2011-12-04 | Discharge: 2011-12-04 | Disposition: A | Discharge status: Home / Self Care | Payer: Medicare Other | Source: Ambulatory Visit | Attending: Internal Medicine | Admitting: Internal Medicine

## 2011-12-04 ENCOUNTER — Telehealth: Payer: Self-pay | Admitting: Internal Medicine

## 2011-12-04 DIAGNOSIS — R42 Dizziness and giddiness: Secondary | ICD-10-CM | POA: Diagnosis not present

## 2011-12-04 DIAGNOSIS — IMO0001 Reserved for inherently not codable concepts without codable children: Secondary | ICD-10-CM

## 2011-12-04 DIAGNOSIS — F329 Major depressive disorder, single episode, unspecified: Secondary | ICD-10-CM | POA: Diagnosis present

## 2011-12-04 DIAGNOSIS — E785 Hyperlipidemia, unspecified: Secondary | ICD-10-CM | POA: Diagnosis present

## 2011-12-04 DIAGNOSIS — E871 Hypo-osmolality and hyponatremia: Secondary | ICD-10-CM | POA: Diagnosis present

## 2011-12-04 DIAGNOSIS — E878 Other disorders of electrolyte and fluid balance, not elsewhere classified: Secondary | ICD-10-CM

## 2011-12-04 DIAGNOSIS — K5289 Other specified noninfective gastroenteritis and colitis: Secondary | ICD-10-CM | POA: Diagnosis present

## 2011-12-04 DIAGNOSIS — F3289 Other specified depressive episodes: Secondary | ICD-10-CM | POA: Diagnosis present

## 2011-12-04 DIAGNOSIS — E876 Hypokalemia: Secondary | ICD-10-CM | POA: Diagnosis present

## 2011-12-04 DIAGNOSIS — K219 Gastro-esophageal reflux disease without esophagitis: Secondary | ICD-10-CM | POA: Diagnosis present

## 2011-12-04 DIAGNOSIS — D72829 Elevated white blood cell count, unspecified: Secondary | ICD-10-CM | POA: Diagnosis not present

## 2011-12-04 DIAGNOSIS — D1803 Hemangioma of intra-abdominal structures: Secondary | ICD-10-CM

## 2011-12-04 DIAGNOSIS — R7309 Other abnormal glucose: Secondary | ICD-10-CM

## 2011-12-04 DIAGNOSIS — Z87898 Personal history of other specified conditions: Secondary | ICD-10-CM

## 2011-12-04 DIAGNOSIS — I10 Essential (primary) hypertension: Secondary | ICD-10-CM | POA: Diagnosis present

## 2011-12-04 DIAGNOSIS — N12 Tubulo-interstitial nephritis, not specified as acute or chronic: Principal | ICD-10-CM | POA: Diagnosis present

## 2011-12-04 DIAGNOSIS — N179 Acute kidney failure, unspecified: Secondary | ICD-10-CM | POA: Diagnosis present

## 2011-12-04 DIAGNOSIS — M279 Disease of jaws, unspecified: Secondary | ICD-10-CM

## 2011-12-04 DIAGNOSIS — H6690 Otitis media, unspecified, unspecified ear: Secondary | ICD-10-CM

## 2011-12-04 DIAGNOSIS — M810 Age-related osteoporosis without current pathological fracture: Secondary | ICD-10-CM

## 2011-12-04 DIAGNOSIS — N949 Unspecified condition associated with female genital organs and menstrual cycle: Secondary | ICD-10-CM | POA: Diagnosis not present

## 2011-12-04 DIAGNOSIS — Z9079 Acquired absence of other genital organ(s): Secondary | ICD-10-CM | POA: Diagnosis not present

## 2011-12-04 DIAGNOSIS — M6530 Trigger finger, unspecified finger: Secondary | ICD-10-CM

## 2011-12-04 DIAGNOSIS — R109 Unspecified abdominal pain: Secondary | ICD-10-CM | POA: Diagnosis not present

## 2011-12-04 DIAGNOSIS — G4700 Insomnia, unspecified: Secondary | ICD-10-CM | POA: Diagnosis present

## 2011-12-04 DIAGNOSIS — R5381 Other malaise: Secondary | ICD-10-CM | POA: Diagnosis not present

## 2011-12-04 DIAGNOSIS — N281 Cyst of kidney, acquired: Secondary | ICD-10-CM

## 2011-12-04 DIAGNOSIS — N10 Acute tubulo-interstitial nephritis: Secondary | ICD-10-CM | POA: Diagnosis not present

## 2011-12-04 DIAGNOSIS — R5383 Other fatigue: Secondary | ICD-10-CM | POA: Diagnosis not present

## 2011-12-04 DIAGNOSIS — K529 Noninfective gastroenteritis and colitis, unspecified: Secondary | ICD-10-CM | POA: Diagnosis present

## 2011-12-04 DIAGNOSIS — R112 Nausea with vomiting, unspecified: Secondary | ICD-10-CM

## 2011-12-04 DIAGNOSIS — N390 Urinary tract infection, site not specified: Secondary | ICD-10-CM | POA: Diagnosis present

## 2011-12-04 LAB — CBC WITH DIFFERENTIAL/PLATELET
Eosinophils Relative: 0 % (ref 0–5)
HCT: 40.3 % (ref 36.0–46.0)
Lymphocytes Relative: 4 % — ABNORMAL LOW (ref 12–46)
Lymphs Abs: 0.4 10*3/uL — ABNORMAL LOW (ref 0.7–4.0)
MCV: 82.6 fL (ref 78.0–100.0)
Neutro Abs: 8.5 10*3/uL — ABNORMAL HIGH (ref 1.7–7.7)
Platelets: 123 10*3/uL — ABNORMAL LOW (ref 150–400)
RBC: 4.88 MIL/uL (ref 3.87–5.11)
WBC: 9.7 10*3/uL (ref 4.0–10.5)

## 2011-12-04 LAB — URINALYSIS, ROUTINE W REFLEX MICROSCOPIC
Glucose, UA: NEGATIVE mg/dL
pH: 5.5 (ref 5.0–8.0)

## 2011-12-04 LAB — URINE MICROSCOPIC-ADD ON

## 2011-12-04 LAB — BASIC METABOLIC PANEL
CO2: 26 mEq/L (ref 19–32)
Calcium: 8.8 mg/dL (ref 8.4–10.5)
Chloride: 90 mEq/L — ABNORMAL LOW (ref 96–112)
Sodium: 130 mEq/L — ABNORMAL LOW (ref 135–145)

## 2011-12-04 LAB — LACTIC ACID, PLASMA: Lactic Acid, Venous: 1.4 mmol/L (ref 0.5–2.2)

## 2011-12-04 MED ORDER — ACETAMINOPHEN 650 MG RE SUPP: 650.0000 mg | Freq: Four times a day (QID) | RECTAL | Status: DC | PRN

## 2011-12-04 MED ORDER — POTASSIUM CHLORIDE IN NACL 40-0.9 MEQ/L-% IV SOLN
INTRAVENOUS | Status: DC
  Administered 2011-12-04 – 2011-12-05 (×2): via INTRAVENOUS
  Administered 2011-12-05: 100 mL/h via INTRAVENOUS
  Administered 2011-12-06: 22:00:00 via INTRAVENOUS
  Filled 2011-12-04 (×6): qty 1000

## 2011-12-04 MED ORDER — DEXTROSE 5 % IV SOLN
1.0000 g | Freq: Once | INTRAVENOUS | Status: AC
  Administered 2011-12-04: 1 g via INTRAVENOUS
  Filled 2011-12-04: qty 10

## 2011-12-04 MED ORDER — LOPERAMIDE HCL 2 MG PO CAPS: 2.0000 mg | ORAL_CAPSULE | ORAL | Status: DC | PRN

## 2011-12-04 MED ORDER — ONDANSETRON HCL 4 MG/2ML IJ SOLN
4.0000 mg | Freq: Four times a day (QID) | INTRAMUSCULAR | Status: DC | PRN
  Administered 2011-12-05: 4 mg via INTRAVENOUS
  Filled 2011-12-04: qty 2

## 2011-12-04 MED ORDER — METRONIDAZOLE IN NACL 5-0.79 MG/ML-% IV SOLN
500.0000 mg | Freq: Three times a day (TID) | INTRAVENOUS | Status: DC
  Administered 2011-12-04 – 2011-12-07 (×8): 500 mg via INTRAVENOUS
  Filled 2011-12-04 (×9): qty 100

## 2011-12-04 MED ORDER — POTASSIUM CHLORIDE 10 MEQ/100ML IV SOLN
10.0000 meq | INTRAVENOUS | Status: AC
  Administered 2011-12-04 – 2011-12-05 (×3): 10 meq via INTRAVENOUS
  Filled 2011-12-04 (×3): qty 100

## 2011-12-04 MED ORDER — MORPHINE SULFATE 2 MG/ML IJ SOLN: 1.0000 mg | INTRAMUSCULAR | Status: DC | PRN

## 2011-12-04 MED ORDER — ACETAMINOPHEN 325 MG PO TABS
650.0000 mg | ORAL_TABLET | Freq: Four times a day (QID) | ORAL | Status: DC | PRN
  Administered 2011-12-04: 650 mg via ORAL
  Filled 2011-12-04: qty 2

## 2011-12-04 MED ORDER — SODIUM CHLORIDE 0.9 % IV BOLUS (SEPSIS)
500.0000 mL | Freq: Once | INTRAVENOUS | Status: AC
  Administered 2011-12-04: 500 mL via INTRAVENOUS

## 2011-12-04 MED ORDER — ONDANSETRON HCL 4 MG PO TABS: 4.0000 mg | ORAL_TABLET | Freq: Four times a day (QID) | ORAL | Status: DC | PRN

## 2011-12-04 MED ORDER — DEXTROSE 5 % IV SOLN
1.0000 g | INTRAVENOUS | Status: DC
  Administered 2011-12-05 – 2011-12-06 (×2): 1 g via INTRAVENOUS
  Filled 2011-12-04 (×2): qty 10

## 2011-12-04 MED ORDER — ONDANSETRON HCL 4 MG/2ML IJ SOLN
4.0000 mg | Freq: Once | INTRAMUSCULAR | Status: AC
  Administered 2011-12-04: 4 mg via INTRAVENOUS
  Filled 2011-12-04: qty 2

## 2011-12-04 MED ORDER — IOHEXOL 300 MG/ML  SOLN
80.0000 mL | Freq: Once | INTRAMUSCULAR | Status: AC | PRN
  Administered 2011-12-04: 80 mL via INTRAVENOUS

## 2011-12-04 NOTE — ED Notes (Signed)
Pt c/o emesis, diarrhea and fever x2 days. Saw pcp at [facility], diagnosed with bladder infection. States she has not eaten anything x2 days d/t symptoms. Prescribed zofran, flagyl, cipro. Husband worried b/c pt has not eaten anything in 2 days, requesting fluids

## 2011-12-04 NOTE — H&P (Signed)
[facility] History and Physical  Cynthia Garza ZOX:[id number] DOB: 09/12/1932 DOA: 12/04/2011  Referring physician: Nelva Nay, ER Physician PCP: Sonda Primes, MD   Chief Complaint: Nausea, vomiting and abdominal pain and weakness  HPI:  Patient is a 78 year old white female with past medical history of hypertension, dyslipidemia and GERD who is been in her usual state of health, but then several days ago she started complaining of nausea, vomiting, fevers and diarrhea along with secondary weakness and malaise. She saw her primary care physician's partner in the office yesterday 7/10 and was found to have a large urinary tract infection with a white count of 23. Patient was started on Cipro as well as Flagyl (for the GI symptoms) and a KUB was done which was normal.  She continued persistence of symptoms today and contacted her primary care physician again. A CT of the abdomen and pelvis was ordered which noted signs most consistent with a pyelonephritis on the right hand side but some possible findings were a renal cell carcinoma could not be ruled out as well. There were no signs of any colitis. The patient's persistent symptoms she came into the emergency room. There she was found have a documented temperature of 102. Her potassium was down to 2.8 but her white count actually improved and was down to 9.7.  Given persistence of symptoms, her lab work and CT findings, it was felt best that the hospitalists to evaluate and admit the patient.  Review of Systems:  When I saw the patient and emergency room, she was doing okay. She complained of generalized fatigue and overall not feeling well. She felt feverish. She complained of nausea and vomiting. She denied any vision changes but did complain of a headache. She denied any dysphasia, chest pain, palpitations, shortness of breath, wheeze, cough. She did complain of some generalized nonspecific abdominal pain. She denied any hematuria  or dysuria or constipation. She did complain of diarrhea. 90 focal extremity numbness weakness or pain. Review of systems otherwise negative.  Past Medical History  Diagnosis Date  . Hemangioma of intra-abdominal structures   . Renal cyst   . Shingles   . Paresthesia   . Heart murmur   . Dyslipidemia   . GERD (gastroesophageal reflux disease)   . Osteoporosis   . Insomnia   . Depression   . HTN (hypertension)   . LBP (low back pain)    Past Surgical History  Procedure Date  . Abdominal hysterectomy    Social History:  reports that she has never smoked. She does not have any smokeless tobacco history on file. She reports that she does not drink alcohol or use illicit drugs. the patient lives at home with her husband. She is normally able to participate in full activities of daily living.  Allergies  Allergen Reactions  . Simvastatin     REACTION: aches    Family History  Problem Relation Age of Onset  . Kidney failure Mother   . Cancer Father   . Hypertension Father   . Coronary artery disease Neg Hx     Prior to Admission medications   Medication Sig Start Date End Date Taking? Authorizing Provider  aspirin 81 MG EC tablet Take 81 mg by mouth daily.     Yes Historical Provider, MD  buPROPion (WELLBUTRIN XL) 150 MG 24 hr tablet TAKE 1 TABLET (150 MG TOTAL) BY MOUTH DAILY. 11/13/11  Yes Tresa Garter, MD  Cholecalciferol 1000 UNITS tablet Take 2,000 Units by  mouth daily.     Yes Historical Provider, MD  ciprofloxacin (CIPRO) 500 MG tablet Take 1 tablet (500 mg total) by mouth 2 (two) times daily. 12/03/11 12/13/11 Yes Newt Lukes, MD  losartan-hydrochlorothiazide (HYZAAR) 100-25 MG per tablet TAKE 1 TABLET BY MOUTH DAILY. 10/25/11  Yes Georgina Quint Plotnikov, MD  meclizine (ANTIVERT) 12.5 MG tablet Take 1-2 tablets (12.5-25 mg total) by mouth 4 (four) times daily as needed. 02/14/11  Yes Georgina Quint Plotnikov, MD  metroNIDAZOLE (FLAGYL) 500 MG tablet Take 1 tablet (500 mg  total) by mouth 3 (three) times daily. 12/03/11 12/13/11 Yes Newt Lukes, MD  Multiple Vitamins-Minerals (MULTIVITAMIN,TX-MINERALS) tablet Take 1 tablet by mouth daily.     Yes Historical Provider, MD  omeprazole (PRILOSEC) 40 MG capsule Take 1 capsule (40 mg total) by mouth daily. 11/04/11 11/03/12 Yes Georgina Quint Plotnikov, MD  ondansetron (ZOFRAN) 4 MG tablet Take 1 tablet (4 mg total) by mouth every 8 (eight) hours as needed for nausea. 12/03/11 12/10/11 Yes Newt Lukes, MD  valsartan-hydrochlorothiazide (DIOVAN HCT) 160-25 MG per tablet Take 1 tablet by mouth daily. 05/08/11 05/07/12 Yes Georgina Quint Plotnikov, MD  vitamin B-12 (CYANOCOBALAMIN) 1000 MCG tablet Take 1,000 mcg by mouth daily.     Yes Historical Provider, MD  zolpidem (AMBIEN) 10 MG tablet Take 10 mg by mouth at bedtime as needed.     Yes Historical Provider, MD   Physical Exam: Filed Vitals:   12/04/11 2000 12/04/11 2045 12/04/11 2100 12/04/11 2104  BP: 136/54 138/58 132/56   Pulse: 99 97 100   Temp:    102 F (38.9 C)  TempSrc:    Oral  Resp: 26 23 29    SpO2: 94% 95% 95%      General:  Mild distress secondary to symptoms, looks about stated age, fatigued, alert and oriented x3  Eyes: Extraocular movements intact, sclera nonicteric  ENT: Normocephalic, atraumatic, mucous membranes are dry  Neck: Supple, no thyromegaly no JVD  Cardiovascular: Regular rate and rhythm, borderline tachycardia  Respiratory: Clear to auscultation bilaterally  Abdomen: Soft, nontender, mostly hypoactive bowel sounds , nontender  Skin: No evidence of any rashes or lesions or skin breakdown  Musculoskeletal: Some generalized non-focal weakness. Extremities are symmetric and equal in strength  Psychiatric: Appropriate, no evidence of psychoses  Neurologic: No focal neurological deficits  Labs on Admission:  Basic Metabolic Panel:  Lab 12/04/11 7829 12/03/11 1606  NA 130* 135  K 2.8* 3.6  CL 90* 94*  CO2 26 29  GLUCOSE  156* 166*  BUN 26* 28*  CREATININE 1.22* 1.3*  CALCIUM 8.8 9.1  MG -- --  PHOS -- --   Liver Function Tests:  Lab 12/03/11 1606  AST 33  ALT 18  ALKPHOS 54  BILITOT 1.3*  PROT 7.5  ALBUMIN 4.0   CBC:  Lab 12/04/11 1700 12/03/11 1606  WBC 9.7 23.2 Repeated and verified X2.*  NEUTROABS 8.5* 19.9*  HGB 13.7 14.2  HCT 40.3 41.5  MCV 82.6 83.8  PLT 123* 209.0    Radiological Exams on Admission: Dg Abd 1 View 12/03/2011   IMPRESSION: No acute abdominal abnormality.  Original Report Authenticated By: Arnell Sieving, M.D.   Ct Abdomen Pelvis W Contrast 12/04/2011  IMPRESSION: Heterogeneous enhancement of the right kidney.  These findings are concerning for acute inflammation and pyelonephritis.  Suspicious thickening of the proximal right ureter with inflammatory changes along the course of the right ureter.  These findings could be inflammatory or  infectious in etiology but also concerning for an underlying neoplastic process.  Please note that the abnormal enhancement in the right kidney could also be neoplastic in origin.  Recommend urologic consultation for further evaluation of these findings.  Two suspicious lesions within the left kidney.  These are concerning for complex renal cysts which may be neoplastic. Recommend further evaluation with a non emergent MRI of the kidneys.  There is mild stranding around the gallbladder fundus.  Consider abdominal ultrasound to evaluate for cholecystitis and cholelithiasis.    Assessment/Plan Principal Problem:  *Pyelonephritis: See below. IV antibiotics plus consult with urology.  Active Problems:  DYSLIPIDEMIA: Not on statin secondary to allergy.   DEPRESSION: Continue Wellbutrin.   HYPERTENSION: Holding antihypertensives given dehydration. Hydrate him.   GASTROESOPHAGEAL REFLUX DISEASE: IV PPI   Insomnia: When necessary Ambien.   UTI (lower urinary tract infection): Treated with IV Rocephin. Awaiting cultures. Will ask  urology to review in terms of inflammatory changes to make sure there is no obstructing lesion. Patient would also benefit from renal MRI done as outpatient   ARF (acute renal failure): Secondary UTI post gastroenteritis. IV fluids.   Gastroenteritis: When necessary Imodium. Currently on Cipro and Flagyl. Using anti-emetics.   Hypokalemia: Replacing  Hyponatremia: Secondary dehydration. Treat with IV fluids.  Questionable gallbladder disease: We'll check abdominal ultrasound as patient improves and is feeling better.  Code Status: Full code  Family Communication: Have discussed with the patient's daughter. Her husband is not present as he was here earlier. His number [phone] (Home)  Disposition Plan: Depending on urology's plan, in the meantime will treat with IV fluids plus IV antibiotics. Patient could potentially go home in the next few days.  Hollice Espy [facility] Pager [phone]  If 7PM-7AM, please contact night-coverage [email or web address] Password [facility] 12/04/2011, 9:24 PM

## 2011-12-04 NOTE — Telephone Encounter (Signed)
Received fax needing PA for generic zofran. Notified insurance fax over Pa form.

## 2011-12-04 NOTE — ED Provider Notes (Signed)
History     CSN: [id number]  Arrival date & time 12/04/11  1602   First MD Initiated Contact with Patient 12/04/11 1612      Chief Complaint  Patient presents with  . Emesis  . Cystitis     HPI Pt c/o emesis, diarrhea and fever x2 days. Saw pcp at [facility], diagnosed with bladder infection. States she has not eaten anything x2 days d/t symptoms. Prescribed zofran, flagyl, cipro. Husband worried b/c pt has not eaten anything in 2 days, requesting fluids   Past Medical History  Diagnosis Date  . Hemangioma of intra-abdominal structures   . Renal cyst   . Shingles   . Paresthesia   . Heart murmur   . Dyslipidemia   . GERD (gastroesophageal reflux disease)   . Osteoporosis   . Insomnia   . Depression   . HTN (hypertension)   . LBP (low back pain)     Past Surgical History  Procedure Date  . Abdominal hysterectomy     Family History  Problem Relation Age of Onset  . Kidney failure Mother   . Cancer Father   . Hypertension Father   . Coronary artery disease Neg Hx     History  Substance Use Topics  . Smoking status: Never Smoker   . Smokeless tobacco: Not on file  . Alcohol Use: No    OB History    Grav Para Term Preterm Abortions TAB SAB Ect Mult Living                  Review of Systems  All other systems reviewed and are negative.    Allergies  Simvastatin  Home Medications   Current Outpatient Rx  Name Route Sig Dispense Refill  . ASPIRIN 81 MG PO TBEC Oral Take 81 mg by mouth daily.      . BUPROPION HCL ER (XL) 150 MG PO TB24  TAKE 1 TABLET (150 MG TOTAL) BY MOUTH DAILY. 90 tablet 3  . CHOLECALCIFEROL 1000 UNITS PO TABS Oral Take 2,000 Units by mouth daily.      Marland Kitchen CIPROFLOXACIN HCL 500 MG PO TABS Oral Take 1 tablet (500 mg total) by mouth 2 (two) times daily. 14 tablet 0  . LOSARTAN POTASSIUM-HCTZ 100-25 MG PO TABS  TAKE 1 TABLET BY MOUTH DAILY. 90 tablet 0  . MECLIZINE HCL 12.5 MG PO TABS Oral Take 1-2 tablets (12.5-25 mg total) by mouth 4  (four) times daily as needed. 30 tablet 1  . METRONIDAZOLE 500 MG PO TABS Oral Take 1 tablet (500 mg total) by mouth 3 (three) times daily. 21 tablet 0  . SUPER HIGH VITAMINS/MINERALS PO TABS Oral Take 1 tablet by mouth daily.      Marland Kitchen OMEPRAZOLE 40 MG PO CPDR Oral Take 1 capsule (40 mg total) by mouth daily. 30 capsule 11  . ONDANSETRON HCL 4 MG PO TABS Oral Take 1 tablet (4 mg total) by mouth every 8 (eight) hours as needed for nausea. 20 tablet 0  . VALSARTAN-HYDROCHLOROTHIAZIDE 160-25 MG PO TABS Oral Take 1 tablet by mouth daily. 90 tablet 3  . VITAMIN B-12 1000 MCG PO TABS Oral Take 1,000 mcg by mouth daily.      Marland Kitchen ZOLPIDEM TARTRATE 10 MG PO TABS Oral Take 10 mg by mouth at bedtime as needed.        BP 159/60  Pulse 103  Temp 98.7 F (37.1 C) (Oral)  Resp 16  SpO2 95%  Physical Exam  Nursing note and vitals reviewed. Constitutional: She is oriented to person, place, and time. She appears well-developed and well-nourished. She appears lethargic. She has a sickly appearance. No distress.  HENT:  Head: Normocephalic and atraumatic.  Eyes: Pupils are equal, round, and reactive to light.  Neck: Normal range of motion.  Cardiovascular: Normal rate and intact distal pulses.   Pulmonary/Chest: No respiratory distress.  Abdominal: Normal appearance. She exhibits no distension, no pulsatile liver and no abdominal bruit. There is no tenderness.  Musculoskeletal: Normal range of motion.  Neurological: She is oriented to person, place, and time. She appears lethargic. No cranial nerve deficit.  Skin: Skin is warm and dry. No rash noted.  Psychiatric: She has a normal mood and affect. Her behavior is normal.    ED Course  Procedures (including critical care time)  Labs Reviewed  URINALYSIS, ROUTINE W REFLEX MICROSCOPIC - Abnormal; Notable for the following:    APPearance CLOUDY (*)     Specific Gravity, Urine >1.046 (*)     Hgb urine dipstick LARGE (*)     Ketones, ur TRACE (*)      Protein, ur 100 (*)     Leukocytes, UA SMALL (*)     All other components within normal limits  BASIC METABOLIC PANEL - Abnormal; Notable for the following:    Sodium 130 (*)     Potassium 2.8 (*)     Chloride 90 (*)     Glucose, Bld 156 (*)     BUN 26 (*)     Creatinine, Ser 1.22 (*)     GFR calc non Af Amer 42 (*)     GFR calc Af Amer 49 (*)     All other components within normal limits  CBC WITH DIFFERENTIAL - Abnormal; Notable for the following:    Platelets 123 (*)     Neutrophils Relative 88 (*)     Neutro Abs 8.5 (*)     Lymphocytes Relative 4 (*)     Lymphs Abs 0.4 (*)     All other components within normal limits  URINE MICROSCOPIC-ADD ON - Abnormal; Notable for the following:    Squamous Epithelial / LPF FEW (*)     Bacteria, UA FEW (*)     All other components within normal limits  LACTIC ACID, PLASMA   Dg Abd 1 View  12/03/2011  *RADIOLOGY REPORT*  Clinical Data: Abdominal pain.  Nausea and vomiting.  Constipation.  ABDOMEN - 1 VIEW  Comparison: None.  Findings: Bowel gas pattern unremarkable without evidence of obstruction or significant ileus.  Gas in several nondistended loops of small bowel.  Gas and expected amount of stool throughout decompressed colon.  Numerous pelvic phleboliths.  No visible opaque urinary tract calculi.  Mild degenerative changes involving the lower lumbar spine.  IMPRESSION: No acute abdominal abnormality.  Original Report Authenticated By: Arnell Sieving, M.D.   Ct Abdomen Pelvis W Contrast  12/04/2011  *RADIOLOGY REPORT*  Clinical Data: Abdominal and pelvic pain.  CT ABDOMEN AND PELVIS WITH CONTRAST  Technique:  Multidetector CT imaging of the abdomen and pelvis was performed following the standard protocol during bolus administration of intravenous contrast.  Contrast: 80mL OMNIPAQUE IOHEXOL 300 MG/ML  SOLN  Comparison: Abdominal image 12/03/2011  Findings: There are peripheral reticular densities at the right lung base but no focal areas  of consolidation or pleural effusions at the lung base.  These reticular densities most likely represent chronic lung changes.  There is no evidence  for free intraperitoneal air.  Evidence for mitral annular calcifications.  There is mild stranding along the wall of the gallbladder fundus best seen on sequence 2, image 26. Overall, the gallbladder is not significantly distended and there is no evidence for biliary dilatation.  There is a normal appearance of the liver and portal venous system.  Normal appearance of the spleen, pancreas and adrenal glands.  There are multiple low-density structures involving the renal cortices bilaterally.  Many these low density structures in the renal cortex are suggestive for cysts but there are two suspicious lesions in the left kidney.  One suspicious lesion along the anterior left kidney on sequence #2, image 27 that measures 1.4 cm. This lesion is significantly more dense on the delayed images and enhancement cannot be excluded.  The second lesion is a cystic structure along the left kidney upper pole which may have peripheral nodularity.  In addition, there is diffuse scarring in both kidneys and heterogeneous enhancement of the kidneys, right side greater than left.  There is mild right perinephric stranding.  Wall thickening in the proximal right ureter with mild dilatation of the right renal pelvis and inflammation along the course of the right ureter. There appears be a small amount of fluid tracking along the mid and distal right ureter.  No significant free fluid in the pelvis.  No significant lymphadenopathy within the abdomen or pelvis. There is a small calcification in the medial right upper kidney which could represent a small stone.  Uterus has been removed.  Urinary bladder is decompressed.  There is no acute bony abnormality. There are colonic diverticula but no evidence for acute colonic inflammation.  IMPRESSION: Heterogeneous enhancement of the right kidney.   These findings are concerning for acute inflammation and pyelonephritis.  Suspicious thickening of the proximal right ureter with inflammatory changes along the course of the right ureter.  These findings could be inflammatory or infectious in etiology but also concerning for an underlying neoplastic process.  Please note that the abnormal enhancement in the right kidney could also be neoplastic in origin.  Recommend urologic consultation for further evaluation of these findings.  Two suspicious lesions within the left kidney.  These are concerning for complex renal cysts which may be neoplastic. Recommend further evaluation with a non emergent MRI of the kidneys.  There is mild stranding around the gallbladder fundus.  Consider abdominal ultrasound to evaluate for cholecystitis and cholelithiasis.  These results were called by telephone on 12/04/2011  at  4:15 p.m. to  Dr. Felicity Coyer, who verbally acknowledged these results.  Original Report Authenticated By: Richarda Overlie, M.D.     1. Pyelonephritis   2. Hyponatremia   3. Hypochloremia       MDM          Nelia Shi, MD 12/04/11 2025

## 2011-12-04 NOTE — ED Notes (Signed)
Attempted to call report. Receiving RN, Gloriajean Dell, busy.

## 2011-12-04 NOTE — Telephone Encounter (Signed)
Caller: Kelly/Spouse; PCP: Sonda Primes; CB#: [phone]; ; ; Call regarding Seen 7/9; Pt Not Improving- Needs To Know If Pt Needs To Come To Office Or ED; Husband reports patient is nauseated, has back pain, leg pain, and abdominal pain; Patient was seen on 12/03/11 for this issue. Patient is still not eating. Husband reports fever, but is not able to tell me the temperature specifically. Husband answered a call during our conversation and an appointment for a CT scan was scheduled for this afternoon. He reports that he doesn't have any other questions for me right now. Instructed to call back if symptoms worsen before the appointment.

## 2011-12-04 NOTE — Telephone Encounter (Signed)
Agree as per AVP: proceed with CT if pt is able (i will receive the radiology call on results) -  Go to ER if worse or unable to complete CT/take meds

## 2011-12-04 NOTE — Telephone Encounter (Signed)
Cont Abx Go ahead w/a CT scan Go to ER if not able to eat/drink/keep meds down or if she is worse - she may need IVF, IV abx OV w/any MD if needed

## 2011-12-04 NOTE — Telephone Encounter (Signed)
Notified pt husband with md response. He states will go to have Ct first appt @ 3:00 this afternnon, and then he will take her to ER because she is still not eating/drinking... 12/04/11@2 :04pm/LMB

## 2011-12-05 DIAGNOSIS — Z9079 Acquired absence of other genital organ(s): Secondary | ICD-10-CM

## 2011-12-05 DIAGNOSIS — K219 Gastro-esophageal reflux disease without esophagitis: Secondary | ICD-10-CM

## 2011-12-05 DIAGNOSIS — N281 Cyst of kidney, acquired: Secondary | ICD-10-CM

## 2011-12-05 DIAGNOSIS — N179 Acute kidney failure, unspecified: Secondary | ICD-10-CM | POA: Diagnosis not present

## 2011-12-05 DIAGNOSIS — E871 Hypo-osmolality and hyponatremia: Secondary | ICD-10-CM

## 2011-12-05 DIAGNOSIS — F329 Major depressive disorder, single episode, unspecified: Secondary | ICD-10-CM

## 2011-12-05 DIAGNOSIS — N12 Tubulo-interstitial nephritis, not specified as acute or chronic: Secondary | ICD-10-CM | POA: Diagnosis not present

## 2011-12-05 DIAGNOSIS — N390 Urinary tract infection, site not specified: Secondary | ICD-10-CM

## 2011-12-05 LAB — CBC
Hemoglobin: 12.5 g/dL (ref 12.0–15.0)
MCH: 28.4 pg (ref 26.0–34.0)
MCV: 81.4 fL (ref 78.0–100.0)
Platelets: 100 10*3/uL — ABNORMAL LOW (ref 150–400)
RBC: 4.4 MIL/uL (ref 3.87–5.11)
WBC: 8.5 10*3/uL (ref 4.0–10.5)

## 2011-12-05 LAB — BASIC METABOLIC PANEL
CO2: 24 mEq/L (ref 19–32)
Calcium: 7.9 mg/dL — ABNORMAL LOW (ref 8.4–10.5)
Chloride: 99 mEq/L (ref 96–112)
Glucose, Bld: 158 mg/dL — ABNORMAL HIGH (ref 70–99)
Potassium: 3.4 mEq/L — ABNORMAL LOW (ref 3.5–5.1)
Sodium: 134 mEq/L — ABNORMAL LOW (ref 135–145)

## 2011-12-05 NOTE — Progress Notes (Signed)
[facility] PROGRESS NOTE  Cynthia Garza ZOX:[id number] DOB: 04-Jan-1933 DOA: 12/04/2011 PCP: Sonda Primes, MD  Assessment/Plan: Principal Problem:  *Pyelonephritis Active Problems:  DYSLIPIDEMIA  DEPRESSION  HYPERTENSION  GASTROESOPHAGEAL REFLUX DISEASE  Insomnia  UTI (lower urinary tract infection)  ARF (acute renal failure)  Gastroenteritis  Hypokalemia  Assessment/Plan  Principal Problem:  *Pyelonephritis:  -Patient is currently on IV Rocephin and Flagyl currently -Continue supportive therapy  Active Problems:  DYSLIPIDEMIA:  -Not on statin secondary to allergy.   DEPRESSION:  - Stable. Continue Wellbutrin.   HYPERTENSION:  -Holding antihypertensives given dehydration.  - Will plan on continuing IVF at this juncture .  GASTROESOPHAGEAL REFLUX DISEASE: - Stable with IV PPI   Insomnia: - When necessary Ambien.   UTI (lower urinary tract infection): Treated with IV Rocephin. Awaiting cultures. Will ask urology to review in terms of inflammatory changes to make sure there is no obstructing lesion. Patient would also benefit from renal MRI done as outpatient  - Urology consult pending will touch base tomorrow  ARF (acute renal failure):  -Secondary UTI post gastroenteritis.  -Improved with IV fluids and likely prerenal creatinine 0.97 today  Gastroenteritis: When necessary Imodium. Currently on Cipro and Flagyl. - continue anti-emetics.   Hypokalemia: More than likely secondary to poor oral intake.  Mild hyponatremia at 3.4 today will recheck tomorrow and if low will plan on replacing.  Hyponatremia: Likely due to poor oral solute intake.  -Treat with IV fluids.   Questionable gallbladder disease: We'll check abdominal ultrasound as patient improves and is feeling better.   Code Status: Full Family Communication: Spoke with Patient and Husband at bedside today regarding current condition. Disposition Plan: Pending improvement in clinical  condition.  WIll need to follow up with Urology as far as f/u and recommendations given patient's CT scan results  Penny Pia  [facility] Pager [phone]. If 8PM-8AM, please contact night-coverage at [email or web address], password [facility] 12/05/2011, 7:31 PM  LOS: 1 day   Brief narrative: Please refer to hpi  Consultants:  Urology  Procedures:  CT scan please refer to HPI  Antibiotics:  On Ceftriaxone and flagyl  HPI/Subjective: Patient mentions that she is currently feeling better today.  No acute issues overnight reported.  Objective: Filed Vitals:   12/04/11 2104 12/04/11 2125 12/05/11 0505 12/05/11 1500  BP:  136/68 110/60 120/70  Pulse:  90 81 79  Temp: 102 F (38.9 C) 100.4 F (38 C) 100 F (37.8 C) 98.6 F (37 C)  TempSrc: Oral Oral Oral Oral  Resp:  16 13 16   Height:  5\' 2"  (1.575 m)    Weight:  62.8 kg (138 lb 7.2 oz)    SpO2:  94% 94% 94%    Intake/Output Summary (Last 24 hours) at 12/05/11 1931 Last data filed at 12/05/11 1830  Gross per 24 hour  Intake    680 ml  Output    200 ml  Net    480 ml    Exam:   General:  Pt in NAD, laying in bed  Cardiovascular: RRR, No MRG  Respiratory: clear to auscultation, no wheezes  Abdomen: Soft, suprapubic tenderness, nd  Data Reviewed: Basic Metabolic Panel:  Lab 12/05/11 8295 12/04/11 1700 12/03/11 1606  NA 134* 130* 135  K 3.4* 2.8* 3.6  CL 99 90* 94*  CO2 24 26 29   GLUCOSE 158* 156* 166*  BUN 22 26* 28*  CREATININE 0.97 1.22* 1.3*  CALCIUM 7.9* 8.8 9.1  MG -- -- --  PHOS -- -- --  Liver Function Tests:  Lab 12/03/11 1606  AST 33  ALT 18  ALKPHOS 54  BILITOT 1.3*  PROT 7.5  ALBUMIN 4.0   No results found for this basename: LIPASE:5,AMYLASE:5 in the last 168 hours No results found for this basename: AMMONIA:5 in the last 168 hours CBC:  Lab 12/05/11 0454 12/04/11 1700 12/03/11 1606  WBC 8.5 9.7 23.2 Repeated and verified X2.*  NEUTROABS -- 8.5* 19.9*  HGB 12.5 13.7 14.2  HCT  35.8* 40.3 41.5  MCV 81.4 82.6 83.8  PLT 100* 123* 209.0   Cardiac Enzymes: No results found for this basename: CKTOTAL:5,CKMB:5,CKMBINDEX:5,TROPONINI:5 in the last 168 hours BNP (last 3 results) No results found for this basename: PROBNP:3 in the last 8760 hours CBG: No results found for this basename: GLUCAP:5 in the last 168 hours  Recent Results (from the past 240 hour(s))  CLOSTRIDIUM DIFFICILE BY PCR     Status: Normal   Collection Time   12/05/11  6:43 AM      Component Value Range Status Comment   C difficile by pcr NEGATIVE  NEGATIVE Final      Studies: Dg Abd 1 View  12/03/2011  *RADIOLOGY REPORT*  Clinical Data: Abdominal pain.  Nausea and vomiting.  Constipation.  ABDOMEN - 1 VIEW  Comparison: None.  Findings: Bowel gas pattern unremarkable without evidence of obstruction or significant ileus.  Gas in several nondistended loops of small bowel.  Gas and expected amount of stool throughout decompressed colon.  Numerous pelvic phleboliths.  No visible opaque urinary tract calculi.  Mild degenerative changes involving the lower lumbar spine.  IMPRESSION: No acute abdominal abnormality.  Original Report Authenticated By: Arnell Sieving, M.D.   Ct Abdomen Pelvis W Contrast  12/04/2011  *RADIOLOGY REPORT*  Clinical Data: Abdominal and pelvic pain.  CT ABDOMEN AND PELVIS WITH CONTRAST  Technique:  Multidetector CT imaging of the abdomen and pelvis was performed following the standard protocol during bolus administration of intravenous contrast.  Contrast: 80mL OMNIPAQUE IOHEXOL 300 MG/ML  SOLN  Comparison: Abdominal image 12/03/2011  Findings: There are peripheral reticular densities at the right lung base but no focal areas of consolidation or pleural effusions at the lung base.  These reticular densities most likely represent chronic lung changes.  There is no evidence for free intraperitoneal air.  Evidence for mitral annular calcifications.  There is mild stranding along the wall  of the gallbladder fundus best seen on sequence 2, image 26. Overall, the gallbladder is not significantly distended and there is no evidence for biliary dilatation.  There is a normal appearance of the liver and portal venous system.  Normal appearance of the spleen, pancreas and adrenal glands.  There are multiple low-density structures involving the renal cortices bilaterally.  Many these low density structures in the renal cortex are suggestive for cysts but there are two suspicious lesions in the left kidney.  One suspicious lesion along the anterior left kidney on sequence #2, image 27 that measures 1.4 cm. This lesion is significantly more dense on the delayed images and enhancement cannot be excluded.  The second lesion is a cystic structure along the left kidney upper pole which may have peripheral nodularity.  In addition, there is diffuse scarring in both kidneys and heterogeneous enhancement of the kidneys, right side greater than left.  There is mild right perinephric stranding.  Wall thickening in the proximal right ureter with mild dilatation of the right renal pelvis and inflammation along the course of the right  ureter. There appears be a small amount of fluid tracking along the mid and distal right ureter.  No significant free fluid in the pelvis.  No significant lymphadenopathy within the abdomen or pelvis. There is a small calcification in the medial right upper kidney which could represent a small stone.  Uterus has been removed.  Urinary bladder is decompressed.  There is no acute bony abnormality. There are colonic diverticula but no evidence for acute colonic inflammation.  IMPRESSION: Heterogeneous enhancement of the right kidney.  These findings are concerning for acute inflammation and pyelonephritis.  Suspicious thickening of the proximal right ureter with inflammatory changes along the course of the right ureter.  These findings could be inflammatory or infectious in etiology but also  concerning for an underlying neoplastic process.  Please note that the abnormal enhancement in the right kidney could also be neoplastic in origin.  Recommend urologic consultation for further evaluation of these findings.  Two suspicious lesions within the left kidney.  These are concerning for complex renal cysts which may be neoplastic. Recommend further evaluation with a non emergent MRI of the kidneys.  There is mild stranding around the gallbladder fundus.  Consider abdominal ultrasound to evaluate for cholecystitis and cholelithiasis.  These results were called by telephone on 12/04/2011  at  4:15 p.m. to  Dr. Felicity Coyer, who verbally acknowledged these results.  Original Report Authenticated By: Richarda Overlie, M.D.    Scheduled Meds:   . cefTRIAXone (ROCEPHIN)  IV  1 g Intravenous Q24H  . metronidazole  500 mg Intravenous Q8H  . potassium chloride  10 mEq Intravenous Q1 Hr x 3   Continuous Infusions:   . 0.9 % NaCl with KCl 40 mEq / L 100 mL/hr (12/05/11 1248)    Principal Problem:  *Pyelonephritis Active Problems:  DYSLIPIDEMIA  DEPRESSION  HYPERTENSION  GASTROESOPHAGEAL REFLUX DISEASE  Insomnia  UTI (lower urinary tract infection)  ARF (acute renal failure)  Gastroenteritis  Hypokalemia

## 2011-12-05 NOTE — Progress Notes (Signed)
UR complete 

## 2011-12-05 NOTE — Telephone Encounter (Signed)
ND completed PA fax back to insurance awaiting on approval status... 12/05/11@8 :38am/LMB

## 2011-12-05 NOTE — Telephone Encounter (Signed)
Received Pa back med has been approve. Notified pharmacy spoke with Darel Hong gave approval status...,12/05/11@10L :24am/LMB

## 2011-12-06 DIAGNOSIS — E871 Hypo-osmolality and hyponatremia: Secondary | ICD-10-CM | POA: Diagnosis not present

## 2011-12-06 DIAGNOSIS — K5289 Other specified noninfective gastroenteritis and colitis: Secondary | ICD-10-CM | POA: Diagnosis not present

## 2011-12-06 DIAGNOSIS — E876 Hypokalemia: Secondary | ICD-10-CM | POA: Diagnosis not present

## 2011-12-06 DIAGNOSIS — N12 Tubulo-interstitial nephritis, not specified as acute or chronic: Secondary | ICD-10-CM | POA: Diagnosis not present

## 2011-12-06 LAB — BASIC METABOLIC PANEL
CO2: 22 mEq/L (ref 19–32)
Calcium: 7.6 mg/dL — ABNORMAL LOW (ref 8.4–10.5)
Creatinine, Ser: 0.84 mg/dL (ref 0.50–1.10)
GFR calc non Af Amer: 66 mL/min — ABNORMAL LOW (ref >90)
Glucose, Bld: 113 mg/dL — ABNORMAL HIGH (ref 70–99)
Sodium: 136 mEq/L (ref 135–145)

## 2011-12-06 LAB — URINE CULTURE
Colony Count: NO GROWTH
Special Requests: NORMAL

## 2011-12-06 NOTE — Progress Notes (Signed)
[facility] PROGRESS NOTE  MONIE SHERE ZOX:[id number] DOB: 1933/03/05 DOA: 12/04/2011 PCP: Sonda Primes, MD  Assessment/Plan: Principal Problem:  *Pyelonephritis Active Problems:  DYSLIPIDEMIA  DEPRESSION  HYPERTENSION  GASTROESOPHAGEAL REFLUX DISEASE  Insomnia  UTI (lower urinary tract infection)  ARF (acute renal failure)  Gastroenteritis  Hypokalemia  Pyelonephritis:  -Patient is currently on IV Rocephin and Flagyl currently, currently clinically improved on this regimen. -Continue supportive therapy   Active Problems:   DYSLIPIDEMIA:  -Not on statin secondary to allergy.   DEPRESSION:  - Stable. Continue Wellbutrin.   HYPERTENSION:  -Holding antihypertensives given dehydration.  - Will plan on continuing IVF at this juncture  .  GASTROESOPHAGEAL REFLUX DISEASE:  - Stable with IV PPI   Insomnia:  - When necessary Ambien.   UTI (lower urinary tract infection): Treated with IV Rocephin. Awaiting cultures. Will ask urology to review in terms of inflammatory changes to make sure there is no obstructing lesion. Patient would also benefit from renal MRI done as outpatient  -  Spoke with Urology and they favor having patient follow up with them after her inflammation has subsided for further work-up with MRI as listed above.  ARF (acute renal failure):  -Secondary UTI post gastroenteritis.  -Improved with IV fluids and likely prerenal creatinine 0.97 today   Gastroenteritis: When necessary Imodium. Currently on Cipro and Flagyl.  - continue anti-emetics.   Hypokalemia: More than likely secondary to poor oral intake.  -Has resolved with improved oral intake.  Hyponatremia:  -Likely due to poor oral solute intake.  - Has resolved with IVF and improved oral intake will advance diet.  Code Status: full Family Communication: Spoke with daughter and husband at bedside spent > 20 minutes answering their questions. Disposition Plan: Patient is to follow  up with Urology likely in 2 weeks after discharge for further recommendations.  Will f/u with urine culture results and likely d/c patient soon based on continued clinical improvement.  Penny Pia  [facility] Pager [phone]. If 8PM-8AM, please contact night-coverage at [email or web address], password [facility] 12/06/2011, 6:54 PM  LOS: 2 days   Brief narrative: Please see HPI  Consultants:  Discussed case with Urology.  Very much thank them for their input.  Procedures:  none  Antibiotics:  Pt is on Rocephin and flagyl  HPI/Subjective: Patient mentions that her appetite has improved.  Is looking forward to transitioning home soon.  Has tolerated her clear diet today well.  We will advance diet.  No new complaints today.  Denies any fever, chills, back pain.  Objective: Filed Vitals:   12/05/11 1500 12/05/11 2029 12/06/11 0449 12/06/11 1435  BP: 120/70 138/74 126/74 128/68  Pulse: 79 81 81 71  Temp: 98.6 F (37 C) 98.4 F (36.9 C) 98.1 F (36.7 C) 98.6 F (37 C)  TempSrc: Oral Oral Oral Oral  Resp: 16 16 17 16   Height:      Weight:      SpO2: 94% 95% 95% 97%    Intake/Output Summary (Last 24 hours) at 12/06/11 1854 Last data filed at 12/06/11 1814  Gross per 24 hour  Intake   1420 ml  Output      0 ml  Net   1420 ml    Exam:   General:  Pt in NAD, A & O x 3  Cardiovascular: RRR, No MRG  Respiratory: Clear to auscultation, no wheezes  Abdomen: soft, NT, ND, mild suprapubic discomfort.  Data Reviewed: Basic Metabolic Panel:  Lab 12/06/11 8295 12/05/11  0454 12/04/11 1700 12/03/11 1606  NA 136 134* 130* 135  K 4.1 3.4* 2.8* 3.6  CL 106 99 90* 94*  CO2 22 24 26 29   GLUCOSE 113* 158* 156* 166*  BUN 18 22 26* 28*  CREATININE 0.84 0.97 1.22* 1.3*  CALCIUM 7.6* 7.9* 8.8 9.1  MG -- -- -- --  PHOS -- -- -- --   Liver Function Tests:  Lab 12/03/11 1606  AST 33  ALT 18  ALKPHOS 54  BILITOT 1.3*  PROT 7.5  ALBUMIN 4.0   No results found for this  basename: LIPASE:5,AMYLASE:5 in the last 168 hours No results found for this basename: AMMONIA:5 in the last 168 hours CBC:  Lab 12/05/11 0454 12/04/11 1700 12/03/11 1606  WBC 8.5 9.7 23.2 Repeated and verified X2.*  NEUTROABS -- 8.5* 19.9*  HGB 12.5 13.7 14.2  HCT 35.8* 40.3 41.5  MCV 81.4 82.6 83.8  PLT 100* 123* 209.0   Cardiac Enzymes: No results found for this basename: CKTOTAL:5,CKMB:5,CKMBINDEX:5,TROPONINI:5 in the last 168 hours BNP (last 3 results) No results found for this basename: PROBNP:3 in the last 8760 hours CBG: No results found for this basename: GLUCAP:5 in the last 168 hours  Recent Results (from the past 240 hour(s))  CLOSTRIDIUM DIFFICILE BY PCR     Status: Normal   Collection Time   12/05/11  6:43 AM      Component Value Range Status Comment   C difficile by pcr NEGATIVE  NEGATIVE Final      Studies: Dg Abd 1 View  12/03/2011  *RADIOLOGY REPORT*  Clinical Data: Abdominal pain.  Nausea and vomiting.  Constipation.  ABDOMEN - 1 VIEW  Comparison: None.  Findings: Bowel gas pattern unremarkable without evidence of obstruction or significant ileus.  Gas in several nondistended loops of small bowel.  Gas and expected amount of stool throughout decompressed colon.  Numerous pelvic phleboliths.  No visible opaque urinary tract calculi.  Mild degenerative changes involving the lower lumbar spine.  IMPRESSION: No acute abdominal abnormality.  Original Report Authenticated By: Arnell Sieving, M.D.   Ct Abdomen Pelvis W Contrast  12/04/2011  *RADIOLOGY REPORT*  Clinical Data: Abdominal and pelvic pain.  CT ABDOMEN AND PELVIS WITH CONTRAST  Technique:  Multidetector CT imaging of the abdomen and pelvis was performed following the standard protocol during bolus administration of intravenous contrast.  Contrast: 80mL OMNIPAQUE IOHEXOL 300 MG/ML  SOLN  Comparison: Abdominal image 12/03/2011  Findings: There are peripheral reticular densities at the right lung base but no  focal areas of consolidation or pleural effusions at the lung base.  These reticular densities most likely represent chronic lung changes.  There is no evidence for free intraperitoneal air.  Evidence for mitral annular calcifications.  There is mild stranding along the wall of the gallbladder fundus best seen on sequence 2, image 26. Overall, the gallbladder is not significantly distended and there is no evidence for biliary dilatation.  There is a normal appearance of the liver and portal venous system.  Normal appearance of the spleen, pancreas and adrenal glands.  There are multiple low-density structures involving the renal cortices bilaterally.  Many these low density structures in the renal cortex are suggestive for cysts but there are two suspicious lesions in the left kidney.  One suspicious lesion along the anterior left kidney on sequence #2, image 27 that measures 1.4 cm. This lesion is significantly more dense on the delayed images and enhancement cannot be excluded.  The second lesion  is a cystic structure along the left kidney upper pole which may have peripheral nodularity.  In addition, there is diffuse scarring in both kidneys and heterogeneous enhancement of the kidneys, right side greater than left.  There is mild right perinephric stranding.  Wall thickening in the proximal right ureter with mild dilatation of the right renal pelvis and inflammation along the course of the right ureter. There appears be a small amount of fluid tracking along the mid and distal right ureter.  No significant free fluid in the pelvis.  No significant lymphadenopathy within the abdomen or pelvis. There is a small calcification in the medial right upper kidney which could represent a small stone.  Uterus has been removed.  Urinary bladder is decompressed.  There is no acute bony abnormality. There are colonic diverticula but no evidence for acute colonic inflammation.  IMPRESSION: Heterogeneous enhancement of the  right kidney.  These findings are concerning for acute inflammation and pyelonephritis.  Suspicious thickening of the proximal right ureter with inflammatory changes along the course of the right ureter.  These findings could be inflammatory or infectious in etiology but also concerning for an underlying neoplastic process.  Please note that the abnormal enhancement in the right kidney could also be neoplastic in origin.  Recommend urologic consultation for further evaluation of these findings.  Two suspicious lesions within the left kidney.  These are concerning for complex renal cysts which may be neoplastic. Recommend further evaluation with a non emergent MRI of the kidneys.  There is mild stranding around the gallbladder fundus.  Consider abdominal ultrasound to evaluate for cholecystitis and cholelithiasis.  These results were called by telephone on 12/04/2011  at  4:15 p.m. to  Dr. Felicity Coyer, who verbally acknowledged these results.  Original Report Authenticated By: Richarda Overlie, M.D.    Scheduled Meds:   . cefTRIAXone (ROCEPHIN)  IV  1 g Intravenous Q24H  . metronidazole  500 mg Intravenous Q8H   Continuous Infusions:   . 0.9 % NaCl with KCl 40 mEq / L 75 mL/hr at 12/05/11 2017    Principal Problem:  *Pyelonephritis Active Problems:  DYSLIPIDEMIA  DEPRESSION  HYPERTENSION  GASTROESOPHAGEAL REFLUX DISEASE  Insomnia  UTI (lower urinary tract infection)  ARF (acute renal failure)  Gastroenteritis  Hypokalemia

## 2011-12-07 DIAGNOSIS — K219 Gastro-esophageal reflux disease without esophagitis: Secondary | ICD-10-CM | POA: Diagnosis not present

## 2011-12-07 DIAGNOSIS — N12 Tubulo-interstitial nephritis, not specified as acute or chronic: Secondary | ICD-10-CM | POA: Diagnosis not present

## 2011-12-07 DIAGNOSIS — N179 Acute kidney failure, unspecified: Secondary | ICD-10-CM | POA: Diagnosis not present

## 2011-12-07 DIAGNOSIS — I10 Essential (primary) hypertension: Secondary | ICD-10-CM

## 2011-12-07 DIAGNOSIS — F329 Major depressive disorder, single episode, unspecified: Secondary | ICD-10-CM | POA: Diagnosis not present

## 2011-12-07 DIAGNOSIS — M810 Age-related osteoporosis without current pathological fracture: Secondary | ICD-10-CM

## 2011-12-07 MED ORDER — METRONIDAZOLE 500 MG PO TABS
500.0000 mg | ORAL_TABLET | Freq: Three times a day (TID) | ORAL | Status: DC
  Filled 2011-12-07 (×3): qty 1

## 2011-12-07 MED ORDER — LOPERAMIDE HCL 2 MG PO CAPS: 2.0000 mg | ORAL_CAPSULE | ORAL | Status: DC | PRN

## 2011-12-07 MED ORDER — AMOXICILLIN-POT CLAVULANATE 875-125 MG PO TABS
1.0000 | ORAL_TABLET | Freq: Two times a day (BID) | ORAL | Status: DC
  Administered 2011-12-07: 1 via ORAL
  Filled 2011-12-07 (×2): qty 1

## 2011-12-07 MED ORDER — AMOXICILLIN-POT CLAVULANATE 875-125 MG PO TABS: 1.0000 | ORAL_TABLET | Freq: Two times a day (BID) | ORAL | Status: DC

## 2011-12-07 MED ORDER — METRONIDAZOLE 500 MG PO TABS: 500.0000 mg | ORAL_TABLET | Freq: Three times a day (TID) | ORAL | Status: DC

## 2011-12-07 NOTE — Discharge Summary (Signed)
Physician Discharge Summary  Cynthia Garza:[id number] DOB: Nov 11, 1932 DOA: 12/04/2011  PCP: Cynthia Primes, MD  Admit date: 12/04/2011 Discharge date: 12/07/2011  Recommendations for Outpatient Follow-up:  1. Have recommended that patient stay off of her blood pressure medication while her po intake is fair.  Please follow up blood pressure and decide whether or not to restart BP medication.  Also patient will be d/c on antibiotics and should f/u with urology in 2 weeks for MRI to address abnormal CT scan findings.   Discharge Diagnoses:  Principal Problem:  *Pyelonephritis Active Problems:  DYSLIPIDEMIA  DEPRESSION  HYPERTENSION  GASTROESOPHAGEAL REFLUX DISEASE  Insomnia  UTI (lower urinary tract infection)  ARF (acute renal failure)  Gastroenteritis  Hypokalemia   Pyelonephritis:  -Patient is currently on augmentin and Flagyl currently, currently clinically improved on this regimen. Was initially placed on Rocephin IV.  Urine culture showed no growth.   -Continue supportive therapy  - Follow up with urology once infectious/inflammatory changes has subsided for further recommendations and work up.  They were considering MRI.  Active Problems:  DYSLIPIDEMIA:  -Not on statin secondary to allergy.   DEPRESSION:  - Stable. Continue Wellbutrin.   HYPERTENSION:  -Holding antihypertensives given initial presentation with dehydration.  - Have recommended that patient remain off the Blood Pressure medication of which she has had relatively well controlled blood pressures and her last one recorded as 134/67 .  GASTROESOPHAGEAL REFLUX DISEASE:  - Stable.  Patient will be on oral antibiotics and as such will recommend that she discontinue taking her reflux medication as I do not want to increase her risk of developing C difficile infection/overgrowth.  UTI (lower urinary tract infection):  -Treated with IV Rocephin initially and improved on this regimen.  - Urine culture  negative (no growth) - Spoke with Urology and they favor having patient follow up with them after her inflammation has subsided for further work-up with MRI as listed above.   ARF (acute renal failure):  -Secondary UTI post gastroenteritis.  -Improved with IV fluids and likely prerenal creatinine improved with improved po intake.  Last creatinine 0.84 - Hold blood pressure medication as indicated above.  Gastroenteritis:  - When necessary Imodium. Currently on Flagyl.  - continue anti-emetics prn nausea.  - C difficil pcr was negative while in house.  Hypokalemia: More than likely secondary to poor oral intake.  -Has resolved with improved oral intake.   Hyponatremia:  -Likely due to poor oral solute intake.  - Has resolved with IVF and improved oral intake.   Discharge Condition: Stable  Diet recommendation: As tolerated regular diet  History of present illness:  Original HPI:  Patient is a 78 year old white female with past medical history of hypertension, dyslipidemia and GERD who is been in her usual state of health, but then several days ago she started complaining of nausea, vomiting, fevers and diarrhea along with secondary weakness and malaise. She saw her primary care physician's partner in the office yesterday 7/10 and was found to have a large urinary tract infection with a white count of 23. Patient was started on Cipro as well as Flagyl (for the GI symptoms) and a KUB was done which was normal.  She continued persistence of symptoms today and contacted her primary care physician again. A CT of the abdomen and pelvis was ordered which noted signs most consistent with a pyelonephritis on the right hand side but some possible findings were a renal cell carcinoma could not be ruled out as  well. There were no signs of any colitis. The patient's persistent symptoms she came into the emergency room. There she was found have a documented temperature of 102. Her potassium was down to  2.8 but her white count actually improved and was down to 9.7.  Given persistence of symptoms, her lab work and CT findings, it was felt best that the hospitalists to evaluate and admit the patient.   Hospital Course:  Please see above under A/P  Procedures:  CT scan, but no procedures otherwise  Consultations:  Discussed case with urologist  Discharge Exam: Filed Vitals:   12/07/11 0536  BP: 134/67  Pulse: 78  Temp: 99.2 F (37.3 C)  Resp: 16   Filed Vitals:   12/06/11 0449 12/06/11 1435 12/06/11 2007 12/07/11 0536  BP: 126/74 128/68 139/71 134/67  Pulse: 81 71 73 78  Temp: 98.1 F (36.7 C) 98.6 F (37 C) 98.4 F (36.9 C) 99.2 F (37.3 C)  TempSrc: Oral Oral Oral Oral  Resp: 17 16 18 16   Height:      Weight:      SpO2: 95% 97% 96% 96%   General: Pt in NAD, laying supine.  A & O x3 Cardiovascular: RRR, no murmurs rubs or gallops Respiratory: CTA, no wheezes Abdomen:  Soft, NT, ND  Discharge Instructions  Discharge Orders    Future Appointments: Provider: Department: Dept Phone: Center:   03/04/2012 9:45 AM Cynthia Garter, MD [facility] [phone] [facility]     Future Orders Please Complete By Expires   Diet - low sodium heart healthy      Increase activity slowly      Call MD for:  temperature >100.4      Call MD for:  persistant nausea and vomiting      Call MD for:  extreme fatigue      Discharge instructions      Comments:   Please take antibiotics as indicated.  Also follow up with your primary care physician for further evaluation and recommendations.  Please call and set up appointment with Dr. Ihor Garza (Urologist) in 2 weeks located at [address]  (telephone # [phone])     Medication List  As of 12/07/2011 10:18 AM   STOP taking these medications         ciprofloxacin 500 MG tablet      losartan-hydrochlorothiazide 100-25 MG per tablet      omeprazole 40 MG capsule      valsartan-hydrochlorothiazide 160-25 MG per tablet           TAKE these medications         AMBIEN 10 MG tablet   Generic drug: zolpidem   Take 10 mg by mouth at bedtime as needed.      amoxicillin-clavulanate 875-125 MG per tablet   Commonly known as: AUGMENTIN   Take 1 tablet by mouth every 12 (twelve) hours.      aspirin 81 MG EC tablet   Take 81 mg by mouth daily.      buPROPion 150 MG 24 hr tablet   Commonly known as: WELLBUTRIN XL   TAKE 1 TABLET (150 MG TOTAL) BY MOUTH DAILY.      Cholecalciferol 1000 UNITS tablet   Take 2,000 Units by mouth daily.      loperamide 2 MG capsule   Commonly known as: IMODIUM   Take 1 capsule (2 mg total) by mouth as needed for diarrhea or loose stools.      meclizine  12.5 MG tablet   Commonly known as: ANTIVERT   Take 1-2 tablets (12.5-25 mg total) by mouth 4 (four) times daily as needed.      metroNIDAZOLE 500 MG tablet   Commonly known as: FLAGYL   Take 1 tablet (500 mg total) by mouth 3 (three) times daily.      multivitamin,tx-minerals tablet   Take 1 tablet by mouth daily.      ondansetron 4 MG tablet   Commonly known as: ZOFRAN   Take 1 tablet (4 mg total) by mouth every 8 (eight) hours as needed for nausea.      vitamin B-12 1000 MCG tablet   Commonly known as: CYANOCOBALAMIN   Take 1,000 mcg by mouth daily.              The results of significant diagnostics from this hospitalization (including imaging, microbiology, ancillary and laboratory) are listed below for reference.    Significant Diagnostic Studies: Dg Abd 1 View  12/03/2011  *RADIOLOGY REPORT*  Clinical Data: Abdominal pain.  Nausea and vomiting.  Constipation.  ABDOMEN - 1 VIEW  Comparison: None.  Findings: Bowel gas pattern unremarkable without evidence of obstruction or significant ileus.  Gas in several nondistended loops of small bowel.  Gas and expected amount of stool throughout decompressed colon.  Numerous pelvic phleboliths.  No visible opaque urinary tract calculi.  Mild degenerative changes  involving the lower lumbar spine.  IMPRESSION: No acute abdominal abnormality.  Original Report Authenticated By: Arnell Sieving, M.D.   Ct Abdomen Pelvis W Contrast  12/04/2011  *RADIOLOGY REPORT*  Clinical Data: Abdominal and pelvic pain.  CT ABDOMEN AND PELVIS WITH CONTRAST  Technique:  Multidetector CT imaging of the abdomen and pelvis was performed following the standard protocol during bolus administration of intravenous contrast.  Contrast: 80mL OMNIPAQUE IOHEXOL 300 MG/ML  SOLN  Comparison: Abdominal image 12/03/2011  Findings: There are peripheral reticular densities at the right lung base but no focal areas of consolidation or pleural effusions at the lung base.  These reticular densities most likely represent chronic lung changes.  There is no evidence for free intraperitoneal air.  Evidence for mitral annular calcifications.  There is mild stranding along the wall of the gallbladder fundus best seen on sequence 2, image 26. Overall, the gallbladder is not significantly distended and there is no evidence for biliary dilatation.  There is a normal appearance of the liver and portal venous system.  Normal appearance of the spleen, pancreas and adrenal glands.  There are multiple low-density structures involving the renal cortices bilaterally.  Many these low density structures in the renal cortex are suggestive for cysts but there are two suspicious lesions in the left kidney.  One suspicious lesion along the anterior left kidney on sequence #2, image 27 that measures 1.4 cm. This lesion is significantly more dense on the delayed images and enhancement cannot be excluded.  The second lesion is a cystic structure along the left kidney upper pole which may have peripheral nodularity.  In addition, there is diffuse scarring in both kidneys and heterogeneous enhancement of the kidneys, right side greater than left.  There is mild right perinephric stranding.  Wall thickening in the proximal right ureter  with mild dilatation of the right renal pelvis and inflammation along the course of the right ureter. There appears be a small amount of fluid tracking along the mid and distal right ureter.  No significant free fluid in the pelvis.  No significant lymphadenopathy within  the abdomen or pelvis. There is a small calcification in the medial right upper kidney which could represent a small stone.  Uterus has been removed.  Urinary bladder is decompressed.  There is no acute bony abnormality. There are colonic diverticula but no evidence for acute colonic inflammation.  IMPRESSION: Heterogeneous enhancement of the right kidney.  These findings are concerning for acute inflammation and pyelonephritis.  Suspicious thickening of the proximal right ureter with inflammatory changes along the course of the right ureter.  These findings could be inflammatory or infectious in etiology but also concerning for an underlying neoplastic process.  Please note that the abnormal enhancement in the right kidney could also be neoplastic in origin.  Recommend urologic consultation for further evaluation of these findings.  Two suspicious lesions within the left kidney.  These are concerning for complex renal cysts which may be neoplastic. Recommend further evaluation with a non emergent MRI of the kidneys.  There is mild stranding around the gallbladder fundus.  Consider abdominal ultrasound to evaluate for cholecystitis and cholelithiasis.  These results were called by telephone on 12/04/2011  at  4:15 p.m. to  Dr. Felicity Coyer, who verbally acknowledged these results.  Original Report Authenticated By: Richarda Overlie, M.D.    Microbiology: Recent Results (from the past 240 hour(s))  CLOSTRIDIUM DIFFICILE BY PCR     Status: Normal   Collection Time   12/05/11  6:43 AM      Component Value Range Status Comment   C difficile by pcr NEGATIVE  NEGATIVE Final   URINE CULTURE     Status: Normal   Collection Time   12/05/11 12:52 PM       Component Value Range Status Comment   Specimen Description URINE, RANDOM   Final    Special Requests Normal   Final    Culture  Setup Time 12/05/2011 23:01   Final    Colony Count NO GROWTH   Final    Culture NO GROWTH   Final    Report Status 12/06/2011 FINAL   Final      Labs: Basic Metabolic Panel:  Lab 12/06/11 1610 12/05/11 0454 12/04/11 1700 12/03/11 1606  NA 136 134* 130* 135  K 4.1 3.4* 2.8* 3.6  CL 106 99 90* 94*  CO2 22 24 26 29   GLUCOSE 113* 158* 156* 166*  BUN 18 22 26* 28*  CREATININE 0.84 0.97 1.22* 1.3*  CALCIUM 7.6* 7.9* 8.8 9.1  MG -- -- -- --  PHOS -- -- -- --   Liver Function Tests:  Lab 12/03/11 1606  AST 33  ALT 18  ALKPHOS 54  BILITOT 1.3*  PROT 7.5  ALBUMIN 4.0   No results found for this basename: LIPASE:5,AMYLASE:5 in the last 168 hours No results found for this basename: AMMONIA:5 in the last 168 hours CBC:  Lab 12/05/11 0454 12/04/11 1700 12/03/11 1606  WBC 8.5 9.7 23.2 Repeated and verified X2.*  NEUTROABS -- 8.5* 19.9*  HGB 12.5 13.7 14.2  HCT 35.8* 40.3 41.5  MCV 81.4 82.6 83.8  PLT 100* 123* 209.0   Cardiac Enzymes: No results found for this basename: CKTOTAL:5,CKMB:5,CKMBINDEX:5,TROPONINI:5 in the last 168 hours BNP: BNP (last 3 results) No results found for this basename: PROBNP:3 in the last 8760 hours CBG: No results found for this basename: GLUCAP:5 in the last 168 hours  Time coordinating discharge: > 35 minutes  Signed:  Penny Pia  [facility] 12/07/2011, 10:18 AM

## 2011-12-07 NOTE — Progress Notes (Signed)
Patient given all discharge instructions and follow-up information. Patient and family expressed understanding. Pt discharged home with family.

## 2011-12-11 ENCOUNTER — Encounter: Payer: Self-pay | Admitting: Internal Medicine

## 2011-12-11 ENCOUNTER — Ambulatory Visit (INDEPENDENT_AMBULATORY_CARE_PROVIDER_SITE_OTHER): Payer: Medicare Other | Admitting: Internal Medicine

## 2011-12-11 VITALS — BP 130/82 | HR 82 | Temp 98.0°F | Ht 63.5 in | Wt 137.2 lb

## 2011-12-11 DIAGNOSIS — N12 Tubulo-interstitial nephritis, not specified as acute or chronic: Secondary | ICD-10-CM

## 2011-12-11 DIAGNOSIS — I10 Essential (primary) hypertension: Secondary | ICD-10-CM

## 2011-12-11 DIAGNOSIS — R5383 Other fatigue: Secondary | ICD-10-CM

## 2011-12-11 DIAGNOSIS — R5381 Other malaise: Secondary | ICD-10-CM

## 2011-12-11 NOTE — Progress Notes (Signed)
  Subjective:    Patient ID: Cynthia Garza, female    DOB: [birth year], 78 y.o.   MRN: [id number]  HPI  Here for hospital follow up - Hospitalized for R PN and dehydration - on antibiotics and OP uro follow up for MRI planned ([facility] 7/25) Holding BP med until completed antibiotics (6.5 days from now) - on antibiotics since 7/10 Feels "worn out", but sleeping poorly since home - not utilizing ambein as previously rx'd  Past Medical History  Diagnosis Date  . Hemangioma of intra-abdominal structures   . Renal cyst   . Shingles   . Paresthesia   . Heart murmur   . Dyslipidemia   . GERD (gastroesophageal reflux disease)   . Osteoporosis   . Insomnia   . Depression   . HTN (hypertension)   . LBP (low back pain)     Review of Systems  Constitutional: Positive for activity change and fatigue. Negative for fever and unexpected weight change.  HENT: Negative for neck pain.   Respiratory: Negative for cough and shortness of breath.   Cardiovascular: Negative for chest pain.  Gastrointestinal: Positive for nausea. Negative for vomiting, abdominal pain, diarrhea and constipation.  Genitourinary: Negative for dysuria, frequency, flank pain and difficulty urinating.  Musculoskeletal: Negative for myalgias, back pain and gait problem.  Psychiatric/Behavioral: Positive for disturbed wake/sleep cycle. The patient is nervous/anxious.        Objective:   Physical Exam BP 130/82  Pulse 82  Temp 98 F (36.7 C) (Oral)  Ht 5' 3.5" (1.613 m)  Wt 137 lb 3.2 oz (62.234 kg)  BMI 23.92 kg/m2  SpO2 98%  Constitutional: She appears fatigued but less ill than last week, well-developed and well-nourished. No acute distress. Spouse at side Neck: Normal range of motion. Neck supple. No JVD or LAD present. No thyromegaly present.  Cardiovascular: fast rate but regular rhythm, normal heart sounds.  No murmur heard. No BLE edema. Pulmonary/Chest: Effort normal and breath sounds normal. No  respiratory distress. She has no wheezes.  Abdominal: Soft. Bowel sounds are present. She exhibits no distension. There is no tenderness, rebound or gaurding. no masses Neurological: She is alert and oriented to person, place, and time. No cranial nerve deficit. Coordination normal.  Skin: Skin is warm and dry. No rash noted. No erythema.  Psychiatric: pt seem slightly anxious and despondent, but smiles at jokes. her behavior is normal. Judgment and thought content normal.    Lab Results  Component Value Date   WBC 8.5 12/05/2011   HGB 12.5 12/05/2011   HCT 35.8* 12/05/2011   PLT 100* 12/05/2011   GLUCOSE 113* 12/06/2011   CHOL 213* 07/22/2010   TRIG 198.0* 07/22/2010   HDL 52.00 07/22/2010   LDLDIRECT 143.0 07/22/2010   LDLCALC 96 04/26/2007   ALT 18 12/03/2011   AST 33 12/03/2011   NA 136 12/06/2011   K 4.1 12/06/2011   CL 106 12/06/2011   CREATININE 0.84 12/06/2011   BUN 18 12/06/2011   CO2 22 12/06/2011   TSH 1.20 07/22/2010   HGBA1C 5.7 01/02/2011   CT a/p 12/04/11 reviewed - R PN, ?malignant ureter changes - suggest MRI follow up      Assessment & Plan:  See problem list. Medications and labs reviewed today. Time spent with pt/family today 25 minutes, greater than 50% time spent counseling patient on hospitalization last week for R PN, dehydration and uro follow up plans + medication review.

## 2011-12-11 NOTE — Assessment & Plan Note (Signed)
hypotension from dehydration with PN has resolved, ARI also resolved Resume ARB-hctz now

## 2011-12-11 NOTE — Assessment & Plan Note (Signed)
Acute on chronic symptoms - exacerbated by acute illness/pn/hosp Reviewed labs and meds, encouraged to take as rx'd and follow up in 2 weeks, sooner if worse

## 2011-12-11 NOTE — Assessment & Plan Note (Signed)
Acute illness, early sepsis with hospitlization 7/11-7/15 for same, DC summary reviewed Abnormal CT scan R ureter/kidney on 12/04/11 - s/p uro eval as IP OP uro follow up with planned MRI after acute infection treated and inflammation resolved ([facility] 12/18/11) No need for Flagyl, stop sam but encouraged to continue amox for full 14d course tx

## 2011-12-11 NOTE — Patient Instructions (Signed)
It was good to see you today. We have reviewed your prior records including labs and tests today Stop metronidazole Resume losartan for blood pressure Other Medications reviewed, no other changes at this time. Keep follow up with urology 7/25 ([facility]) Please schedule followup in 2 weeks to continue review, call sooner if problems.

## 2011-12-18 DIAGNOSIS — N289 Disorder of kidney and ureter, unspecified: Secondary | ICD-10-CM | POA: Diagnosis not present

## 2011-12-18 DIAGNOSIS — N10 Acute tubulo-interstitial nephritis: Secondary | ICD-10-CM | POA: Diagnosis not present

## 2011-12-23 ENCOUNTER — Ambulatory Visit (INDEPENDENT_AMBULATORY_CARE_PROVIDER_SITE_OTHER): Payer: Medicare Other | Admitting: Internal Medicine

## 2011-12-23 ENCOUNTER — Encounter: Payer: Self-pay | Admitting: Internal Medicine

## 2011-12-23 VITALS — BP 138/80 | HR 80 | Temp 98.2°F | Resp 16 | Wt 131.0 lb

## 2011-12-23 DIAGNOSIS — N281 Cyst of kidney, acquired: Secondary | ICD-10-CM

## 2011-12-23 DIAGNOSIS — I10 Essential (primary) hypertension: Secondary | ICD-10-CM | POA: Diagnosis not present

## 2011-12-23 DIAGNOSIS — N179 Acute kidney failure, unspecified: Secondary | ICD-10-CM | POA: Diagnosis not present

## 2011-12-23 DIAGNOSIS — N12 Tubulo-interstitial nephritis, not specified as acute or chronic: Secondary | ICD-10-CM | POA: Diagnosis not present

## 2011-12-23 DIAGNOSIS — R21 Rash and other nonspecific skin eruption: Secondary | ICD-10-CM | POA: Diagnosis not present

## 2011-12-23 MED ORDER — HYDROXYZINE HCL 25 MG PO TABS: 25.0000 mg | ORAL_TABLET | Freq: Three times a day (TID) | ORAL | Status: DC | PRN

## 2011-12-23 MED ORDER — TRIAMCINOLONE ACETONIDE 0.5 % EX CREA: TOPICAL_CREAM | Freq: Three times a day (TID) | CUTANEOUS | Status: AC

## 2011-12-23 NOTE — Assessment & Plan Note (Signed)
Dr Vernie Ammons saw the pt on 7/25 and he is scheduling an MRI Her UA was nl

## 2011-12-23 NOTE — Progress Notes (Signed)
Patient ID: Cynthia Garza, female   DOB: Feb 13, 1933, 78 y.o.   MRN: [id number]  Subjective:    Patient ID: Cynthia Garza, female    DOB: 1932-08-08, 78 y.o.   MRN: [id number]  Rash This is a new problem. The current episode started in the past 7 days. The problem is unchanged. The affected locations include the torso. The rash is characterized by itchiness. She was exposed to a new medication (diflucan on Thur, rash on Fri). Associated symptoms include fatigue. Pertinent negatives include no cough, diarrhea, fever, shortness of breath or vomiting. The treatment provided no relief.    She was hospitalized for R PN and dehydration - on antibiotics and OP uro follow up for MRI planned ([facility] 7/25) Holding BP med until completed antibiotics (6.5 days from now) - on antibiotics since 7/10 Feels "worn out", but sleeping poorly since home - not utilizing ambein as previously rx'd  Past Medical History  Diagnosis Date  . Hemangioma of intra-abdominal structures   . Renal cyst   . Shingles   . Paresthesia   . Heart murmur   . Dyslipidemia   . GERD (gastroesophageal reflux disease)   . Osteoporosis   . Insomnia   . Depression   . HTN (hypertension)   . LBP (low back pain)     Review of Systems  Constitutional: Positive for activity change and fatigue. Negative for fever and unexpected weight change.  HENT: Negative for neck pain.   Respiratory: Negative for cough and shortness of breath.   Cardiovascular: Negative for chest pain.  Gastrointestinal: Positive for nausea. Negative for vomiting, abdominal pain, diarrhea and constipation.  Genitourinary: Negative for dysuria, frequency, flank pain and difficulty urinating.  Musculoskeletal: Negative for myalgias, back pain and gait problem.  Skin: Positive for rash.  Psychiatric/Behavioral: Positive for disturbed wake/sleep cycle. The patient is nervous/anxious.        Objective:   Physical Exam  Constitutional: She appears  well-nourished.  Skin:       Papular rash on   torso   BP 138/80  Pulse 80  Temp 98.2 F (36.8 C) (Oral)  Resp 16  Wt 131 lb (59.421 kg)  Constitutional: She appears fatigued but less ill than last week, well-developed and well-nourished. No acute distress. Spouse at side Neck: Normal range of motion. Neck supple. No JVD or LAD present. No thyromegaly present.  Cardiovascular: fast rate but regular rhythm, normal heart sounds.  No murmur heard. No BLE edema. Pulmonary/Chest: Effort normal and breath sounds normal. No respiratory distress. She has no wheezes.  Abdominal: Soft. Bowel sounds are present. She exhibits no distension. There is no tenderness, rebound or gaurding. no masses Neurological: She is alert and oriented to person, place, and time. No cranial nerve deficit. Coordination normal.  Skin: Skin is warm and dry. No rash noted. No erythema.  Psychiatric: pt seem slightly anxious and despondent, but smiles at jokes. her behavior is normal. Judgment and thought content normal.    Lab Results  Component Value Date   WBC 8.5 12/05/2011   HGB 12.5 12/05/2011   HCT 35.8* 12/05/2011   PLT 100* 12/05/2011   GLUCOSE 113* 12/06/2011   CHOL 213* 07/22/2010   TRIG 198.0* 07/22/2010   HDL 52.00 07/22/2010   LDLDIRECT 143.0 07/22/2010   LDLCALC 96 04/26/2007   ALT 18 12/03/2011   AST 33 12/03/2011   NA 136 12/06/2011   K 4.1 12/06/2011   CL 106 12/06/2011   CREATININE 0.84 12/06/2011  BUN 18 12/06/2011   CO2 22 12/06/2011   TSH 1.20 07/22/2010   HGBA1C 5.7 01/02/2011   CT a/p 12/04/11 reviewed - R PN, ?malignant ureter changes - suggest MRI follow up      Assessment & Plan:

## 2011-12-23 NOTE — Assessment & Plan Note (Signed)
Continue with current prescription therapy as reflected on the Med list.  

## 2011-12-23 NOTE — Assessment & Plan Note (Signed)
RTC 6 wks

## 2011-12-23 NOTE — Assessment & Plan Note (Signed)
Depo 120 mg im Triamc cream tid Hydroxyzine prn

## 2011-12-23 NOTE — Assessment & Plan Note (Signed)
MRI is ordered by Dr Vernie Ammons

## 2011-12-24 ENCOUNTER — Ambulatory Visit: Payer: Medicare Other | Admitting: Internal Medicine

## 2011-12-24 DIAGNOSIS — R21 Rash and other nonspecific skin eruption: Secondary | ICD-10-CM | POA: Diagnosis not present

## 2011-12-24 MED ORDER — METHYLPREDNISOLONE ACETATE 80 MG/ML IJ SUSP
120.0000 mg | Freq: Once | INTRAMUSCULAR | Status: AC
  Administered 2011-12-24: 120 mg via INTRAMUSCULAR

## 2011-12-24 NOTE — Addendum Note (Signed)
Addended by: Merrilyn Puma on: 12/24/2011 08:17 AM   Modules accepted: Orders

## 2011-12-31 ENCOUNTER — Other Ambulatory Visit (HOSPITAL_COMMUNITY): Payer: Self-pay | Admitting: Urology

## 2011-12-31 DIAGNOSIS — D49519 Neoplasm of unspecified behavior of unspecified kidney: Secondary | ICD-10-CM

## 2012-01-01 ENCOUNTER — Ambulatory Visit (HOSPITAL_COMMUNITY)
Admission: RE | Admit: 2012-01-01 | Discharge: 2012-01-01 | Disposition: A | Discharge status: Home / Self Care | Payer: Medicare Other | Source: Ambulatory Visit | Attending: Urology | Admitting: Urology

## 2012-01-01 DIAGNOSIS — D49519 Neoplasm of unspecified behavior of unspecified kidney: Secondary | ICD-10-CM

## 2012-01-01 DIAGNOSIS — N289 Disorder of kidney and ureter, unspecified: Secondary | ICD-10-CM | POA: Diagnosis not present

## 2012-01-01 MED ORDER — GADOBENATE DIMEGLUMINE 529 MG/ML IV SOLN
12.0000 mL | Freq: Once | INTRAVENOUS | Status: AC | PRN
  Administered 2012-01-01: 12 mL via INTRAVENOUS

## 2012-01-08 DIAGNOSIS — D4959 Neoplasm of unspecified behavior of other genitourinary organ: Secondary | ICD-10-CM | POA: Diagnosis not present

## 2012-01-13 ENCOUNTER — Other Ambulatory Visit (HOSPITAL_COMMUNITY): Payer: Self-pay | Admitting: Urology

## 2012-01-13 DIAGNOSIS — D4959 Neoplasm of unspecified behavior of other genitourinary organ: Secondary | ICD-10-CM

## 2012-01-13 DIAGNOSIS — N281 Cyst of kidney, acquired: Secondary | ICD-10-CM

## 2012-01-14 ENCOUNTER — Other Ambulatory Visit: Payer: Self-pay | Admitting: Radiology

## 2012-01-14 ENCOUNTER — Encounter (HOSPITAL_COMMUNITY): Payer: Self-pay | Admitting: Pharmacy Technician

## 2012-01-15 ENCOUNTER — Other Ambulatory Visit (HOSPITAL_COMMUNITY): Payer: Self-pay | Admitting: Urology

## 2012-01-15 ENCOUNTER — Encounter (HOSPITAL_COMMUNITY): Payer: Self-pay

## 2012-01-15 ENCOUNTER — Ambulatory Visit (HOSPITAL_COMMUNITY)
Admission: RE | Admit: 2012-01-15 | Discharge: 2012-01-15 | Disposition: A | Discharge status: Home / Self Care | Payer: Medicare Other | Source: Ambulatory Visit | Attending: Urology | Admitting: Urology

## 2012-01-15 VITALS — BP 136/61 | HR 73 | Temp 97.8°F | Resp 16

## 2012-01-15 DIAGNOSIS — Q619 Cystic kidney disease, unspecified: Secondary | ICD-10-CM | POA: Insufficient documentation

## 2012-01-15 DIAGNOSIS — D4959 Neoplasm of unspecified behavior of other genitourinary organ: Secondary | ICD-10-CM | POA: Diagnosis not present

## 2012-01-15 DIAGNOSIS — N281 Cyst of kidney, acquired: Secondary | ICD-10-CM

## 2012-01-15 DIAGNOSIS — D3000 Benign neoplasm of unspecified kidney: Secondary | ICD-10-CM | POA: Diagnosis not present

## 2012-01-15 LAB — APTT: aPTT: 29 seconds (ref 24–37)

## 2012-01-15 LAB — PROTIME-INR
INR: 0.95 (ref 0.00–1.49)
Prothrombin Time: 12.9 seconds (ref 11.6–15.2)

## 2012-01-15 LAB — CBC
Hemoglobin: 12.7 g/dL (ref 12.0–15.0)
MCH: 28.3 pg (ref 26.0–34.0)
MCHC: 34.2 g/dL (ref 30.0–36.0)
Platelets: 182 10*3/uL (ref 150–400)

## 2012-01-15 MED ORDER — SODIUM CHLORIDE 0.9 % IV SOLN
INTRAVENOUS | Status: DC
  Administered 2012-01-15: 500 mL via INTRAVENOUS

## 2012-01-15 MED ORDER — HYDROCODONE-ACETAMINOPHEN 5-325 MG PO TABS
1.0000 | ORAL_TABLET | ORAL | Status: DC | PRN
  Filled 2012-01-15: qty 2

## 2012-01-15 MED ORDER — MIDAZOLAM HCL 2 MG/2ML IJ SOLN
INTRAMUSCULAR | Status: AC
  Filled 2012-01-15: qty 6

## 2012-01-15 MED ORDER — FENTANYL CITRATE 0.05 MG/ML IJ SOLN
INTRAMUSCULAR | Status: AC
  Filled 2012-01-15: qty 6

## 2012-01-15 MED ORDER — MIDAZOLAM HCL 5 MG/5ML IJ SOLN
INTRAMUSCULAR | Status: AC | PRN
  Administered 2012-01-15 (×3): 0.5 mg via INTRAVENOUS
  Administered 2012-01-15: 1 mg via INTRAVENOUS

## 2012-01-15 MED ORDER — FENTANYL CITRATE 0.05 MG/ML IJ SOLN
INTRAMUSCULAR | Status: AC | PRN
  Administered 2012-01-15 (×2): 50 ug via INTRAVENOUS
  Administered 2012-01-15: 100 ug via INTRAVENOUS

## 2012-01-15 NOTE — H&P (Signed)
Cynthia Garza is an 78 y.o. female.   Chief Complaint: "I'm here for a kidney biopsy" HPI: Patient with prior history of right pyelonephritis and recent imaging studies revealing bilateral renal cysts . There is a suspicious 1.6 cm cystic lesion with 4mm mural nodularity in the upper pole of the left kidney . She presents today for US guided biopsy of the left upper pole renal lesion.   Past Medical History  Diagnosis Date  . Hemangioma of intra-abdominal structures   . Renal cyst   . Shingles   . Paresthesia   . Heart murmur   . Dyslipidemia   . GERD (gastroesophageal reflux disease)   . Osteoporosis   . Insomnia   . Depression   . HTN (hypertension)   . LBP (low back pain)     Past Surgical History  Procedure Date  . Abdominal hysterectomy     Family History  Problem Relation Age of Onset  . Kidney failure Mother   . Cancer Father   . Hypertension Father   . Coronary artery disease Neg Hx    Social History:  reports that she has never smoked. She does not have any smokeless tobacco history on file. She reports that she does not drink alcohol or use illicit drugs.  Allergies:  Allergies  Allergen Reactions  . Simvastatin Other (See Comments)    aches  . Diflucan (Fluconazole) Rash    Current outpatient prescriptions:buPROPion (WELLBUTRIN XL) 150 MG 24 hr tablet, Take 150 mg by mouth every morning., Disp: , Rfl: ;  Carboxymethylcellul-Glycerin (OPTIVE) 0.5-0.9 % SOLN, Place 1-2 drops into both eyes at bedtime. For dry eyes, Disp: , Rfl: ;  hydrOXYzine (ATARAX/VISTARIL) 25 MG tablet, Take 25 mg by mouth 2 (two) times daily as needed. For itching, Disp: , Rfl:  losartan-hydrochlorothiazide (HYZAAR) 100-25 MG per tablet, Take 1 tablet by mouth daily at 12 noon. , Disp: 90 tablet, Rfl: 0;  ranitidine (ZANTAC) 150 MG tablet, Take 150 mg by mouth 2 (two) times daily., Disp: , Rfl: ;  triamcinolone cream (KENALOG) 0.5 %, Apply topically 3 (three) times daily., Disp: 120 g,  Rfl: 1;  vitamin B-12 (CYANOCOBALAMIN) 1000 MCG tablet, Take 1,000 mcg by mouth daily.  , Disp: , Rfl:  zolpidem (AMBIEN) 10 MG tablet, Take 10 mg by mouth at bedtime as needed. For sleep, Disp: , Rfl: ;  loperamide (IMODIUM) 2 MG capsule, Take 2 mg by mouth as needed., Disp: , Rfl:  Current facility-administered medications:0.9 %  sodium chloride infusion, , Intravenous, Continuous, Brayton El, PA, Last Rate: 20 mL/hr at 01/15/12 0949, 500 mL at 01/15/12 0949   Results for orders placed during the hospital encounter of 01/15/12 (from the past 48 hour(s))  APTT     Status: Normal   Collection Time   01/15/12  9:40 AM      Component Value Range Comment   aPTT 29  24 - 37 seconds   CBC     Status: Normal   Collection Time   01/15/12  9:40 AM      Component Value Range Comment   WBC 7.9  4.0 - 10.5 K/uL    RBC 4.49  3.87 - 5.11 MIL/uL    Hemoglobin 12.7  12.0 - 15.0 g/dL    HCT 16.1  09.6 - 04.5 %    MCV 82.6  78.0 - 100.0 fL    MCH 28.3  26.0 - 34.0 pg    MCHC 34.2  30.0 - 36.0 g/dL  RDW 14.2  11.5 - 15.5 %    Platelets 182  150 - 400 K/uL   PROTIME-INR     Status: Normal   Collection Time   01/15/12  9:40 AM      Component Value Range Comment   Prothrombin Time 12.9  11.6 - 15.2 seconds    INR 0.95  0.00 - 1.49    No results found.  Review of Systems  Constitutional: Negative for fever and chills.  Respiratory: Negative for cough and shortness of breath.   Cardiovascular: Negative for chest pain.  Gastrointestinal: Negative for nausea, vomiting and abdominal pain.  Genitourinary: Negative for hematuria and flank pain.  Musculoskeletal: Negative for back pain.  Neurological: Negative for headaches.  Endo/Heme/Allergies: Does not bruise/bleed easily.   Vitals:   BP  158/70  HR  78  R 18  TEMP 97.9    O2 SATS 99% RA Physical Exam  Constitutional: She is oriented to person, place, and time. She appears well-developed and well-nourished.  Cardiovascular: Normal rate and  regular rhythm.   Respiratory: Effort normal and breath sounds normal.  GI: Soft. Bowel sounds are normal. There is no tenderness.  Musculoskeletal: Normal range of motion. She exhibits no edema.  Neurological: She is alert and oriented to person, place, and time.     Assessment/Plan Patient with suspicious left upper pole renal lesion with associated mural nodularity; plan is for US guided biopsy today. Details/risks of above d/w pt/husband with their understanding and consent.  Cynthia Cogle,D KEVIN 01/15/2012, 10:30 AM

## 2012-01-15 NOTE — Procedures (Signed)
Interventional Radiology Procedure Note  Procedure: CT guided cyst aspiration, FNA and core biopsy of complex cyst, left upper renal pole Complications: Small perinephric hematoma immediately post procedure Recommendations: - Bedrest x 4 hrs - Will monitor for signs/symptoms of bleeding - Follow bx results  Signed,  Sterling Big, MD Vascular & Interventional Radiologist [facility] Radiology

## 2012-01-15 NOTE — H&P (Signed)
Agree with above.  Cynthia Garza has a complex cystic lesion in the anterior upper pole of the left kidney with a probable small enhancing nodular component.  We will attempt biopsy under real-time sonographic guidance.  If there is no adequate window for [place] bx, we may have to transition to CT guidance.   Signed,  Sterling Big, MD Vascular & Interventional Radiologist [facility] Radiology

## 2012-01-22 DIAGNOSIS — D4959 Neoplasm of unspecified behavior of other genitourinary organ: Secondary | ICD-10-CM | POA: Diagnosis not present

## 2012-02-02 ENCOUNTER — Ambulatory Visit: Payer: Medicare Other | Admitting: Internal Medicine

## 2012-02-09 ENCOUNTER — Encounter: Payer: Self-pay | Admitting: Internal Medicine

## 2012-02-09 ENCOUNTER — Ambulatory Visit (INDEPENDENT_AMBULATORY_CARE_PROVIDER_SITE_OTHER): Payer: Medicare Other | Admitting: Internal Medicine

## 2012-02-09 ENCOUNTER — Other Ambulatory Visit (INDEPENDENT_AMBULATORY_CARE_PROVIDER_SITE_OTHER): Payer: Medicare Other

## 2012-02-09 VITALS — BP 148/88 | HR 72 | Temp 97.9°F | Resp 16 | Wt 132.0 lb

## 2012-02-09 DIAGNOSIS — R413 Other amnesia: Secondary | ICD-10-CM

## 2012-02-09 DIAGNOSIS — Z0000 Encounter for general adult medical examination without abnormal findings: Secondary | ICD-10-CM

## 2012-02-09 DIAGNOSIS — R202 Paresthesia of skin: Secondary | ICD-10-CM

## 2012-02-09 DIAGNOSIS — R5381 Other malaise: Secondary | ICD-10-CM

## 2012-02-09 DIAGNOSIS — G4700 Insomnia, unspecified: Secondary | ICD-10-CM | POA: Diagnosis not present

## 2012-02-09 DIAGNOSIS — I10 Essential (primary) hypertension: Secondary | ICD-10-CM

## 2012-02-09 DIAGNOSIS — R5383 Other fatigue: Secondary | ICD-10-CM

## 2012-02-09 DIAGNOSIS — N12 Tubulo-interstitial nephritis, not specified as acute or chronic: Secondary | ICD-10-CM

## 2012-02-09 DIAGNOSIS — E785 Hyperlipidemia, unspecified: Secondary | ICD-10-CM

## 2012-02-09 DIAGNOSIS — R209 Unspecified disturbances of skin sensation: Secondary | ICD-10-CM

## 2012-02-09 LAB — CBC WITH DIFFERENTIAL/PLATELET
Basophils Relative: 0.5 % (ref 0.0–3.0)
Eosinophils Relative: 0.6 % (ref 0.0–5.0)
HCT: 41.8 % (ref 36.0–46.0)
Hemoglobin: 13.6 g/dL (ref 12.0–15.0)
Lymphs Abs: 2.5 10*3/uL (ref 0.7–4.0)
MCV: 85.4 fl (ref 78.0–100.0)
Monocytes Absolute: 0.7 10*3/uL (ref 0.1–1.0)
Monocytes Relative: 9 % (ref 3.0–12.0)
RBC: 4.89 Mil/uL (ref 3.87–5.11)
WBC: 8.2 10*3/uL (ref 4.5–10.5)

## 2012-02-09 LAB — BASIC METABOLIC PANEL
BUN: 13 mg/dL (ref 6–23)
Chloride: 103 mEq/L (ref 96–112)
Potassium: 3.5 mEq/L (ref 3.5–5.1)
Sodium: 142 mEq/L (ref 135–145)

## 2012-02-09 LAB — URINALYSIS, ROUTINE W REFLEX MICROSCOPIC
Ketones, ur: NEGATIVE
Specific Gravity, Urine: <=1.005 (ref 1.000–1.030)
Urine Glucose: NEGATIVE
pH: 6.5 (ref 5.0–8.0)

## 2012-02-09 LAB — HEPATIC FUNCTION PANEL
AST: 18 U/L (ref 0–37)
Alkaline Phosphatase: 51 U/L (ref 39–117)
Total Bilirubin: 0.9 mg/dL (ref 0.3–1.2)

## 2012-02-09 LAB — TSH: TSH: 1.29 u[IU]/mL (ref 0.35–5.50)

## 2012-02-09 LAB — LIPID PANEL
Total CHOL/HDL Ratio: 3
VLDL: 30.2 mg/dL (ref 0.0–40.0)

## 2012-02-09 LAB — VITAMIN B12: Vitamin B-12: 1238 pg/mL — ABNORMAL HIGH (ref 211–911)

## 2012-02-09 NOTE — Assessment & Plan Note (Signed)
Continue with current prescription therapy as reflected on the Med list.  

## 2012-02-09 NOTE — Assessment & Plan Note (Signed)
On Zolpidem 

## 2012-02-09 NOTE — Assessment & Plan Note (Signed)
Chronic. 

## 2012-02-09 NOTE — Assessment & Plan Note (Signed)
3/3 object recall

## 2012-02-09 NOTE — Progress Notes (Signed)
   Subjective:    Patient ID: Cynthia Garza, female    DOB: [birth year], 77 y.o.   MRN: [id number]  HPI  She was hospitalized for R PN and dehydration - on antibiotics and OP uro follow up for MRI planned ([facility] 7/25) Holding BP med until completed antibiotics (6.5 days from now) - on antibiotics since 7/10  Feels "worn out", but sleeping poorly since home - not utilizing ambein as previously rx'd  Ureter bx was ok  Past Medical History  Diagnosis Date  . Hemangioma of intra-abdominal structures   . Renal cyst   . Shingles   . Paresthesia   . Heart murmur   . Dyslipidemia   . GERD (gastroesophageal reflux disease)   . Osteoporosis   . Insomnia   . Depression   . HTN (hypertension)   . LBP (low back pain)     Review of Systems  Constitutional: Positive for activity change. Negative for unexpected weight change.  HENT: Negative for neck pain.   Cardiovascular: Negative for chest pain.  Gastrointestinal: Positive for nausea. Negative for abdominal pain and constipation.  Genitourinary: Negative for dysuria, frequency, flank pain and difficulty urinating.  Musculoskeletal: Negative for myalgias, back pain and gait problem.  Psychiatric/Behavioral: Positive for disturbed wake/sleep cycle. The patient is nervous/anxious.        Objective:   Physical Exam  Constitutional: She appears well-nourished.  Skin:       Papular rash on   torso   BP 148/88  Pulse 72  Temp 97.9 F (36.6 C) (Oral)  Resp 16  Wt 132 lb 0.6 oz (59.893 kg)  Constitutional: She appears fatigued but less ill than last week, well-developed and well-nourished. No acute distress. Spouse at side Neck: Normal range of motion. Neck supple. No JVD or LAD present. No thyromegaly present.  Cardiovascular: fast rate but regular rhythm, normal heart sounds.  No murmur heard. No BLE edema. Pulmonary/Chest: Effort normal and breath sounds normal. No respiratory distress. She has no wheezes.  Abdominal: Soft.  Bowel sounds are present. She exhibits no distension. There is no tenderness, rebound or gaurding. no masses Neurological: She is alert and oriented to person, place, and time. No cranial nerve deficit. Coordination normal.  Skin: Skin is warm and dry. No rash noted. No erythema.  Psychiatric: pt seem slightly anxious and despondent, but smiles at jokes. her behavior is normal. Judgment and thought content normal.    Lab Results  Component Value Date   WBC 7.9 01/15/2012   HGB 12.7 01/15/2012   HCT 37.1 01/15/2012   PLT 182 01/15/2012   GLUCOSE 113* 12/06/2011   CHOL 213* 07/22/2010   TRIG 198.0* 07/22/2010   HDL 52.00 07/22/2010   LDLDIRECT 143.0 07/22/2010   LDLCALC 96 04/26/2007   ALT 18 12/03/2011   AST 33 12/03/2011   NA 136 12/06/2011   K 4.1 12/06/2011   CL 106 12/06/2011   CREATININE 0.84 12/06/2011   BUN 18 12/06/2011   CO2 22 12/06/2011   TSH 1.20 07/22/2010   INR 0.95 01/15/2012   HGBA1C 5.7 01/02/2011   CT a/p 12/04/11 reviewed - R PN, ?malignant ureter changes - suggest MRI follow up      Assessment & Plan:

## 2012-02-09 NOTE — Assessment & Plan Note (Signed)
Discussed.

## 2012-02-17 ENCOUNTER — Other Ambulatory Visit: Payer: Self-pay | Admitting: Internal Medicine

## 2012-02-23 ENCOUNTER — Other Ambulatory Visit: Payer: Self-pay | Admitting: Internal Medicine

## 2012-03-04 ENCOUNTER — Encounter: Payer: Medicare Other | Admitting: Internal Medicine

## 2012-03-04 DIAGNOSIS — Z23 Encounter for immunization: Secondary | ICD-10-CM | POA: Diagnosis not present

## 2012-05-13 DIAGNOSIS — Z1231 Encounter for screening mammogram for malignant neoplasm of breast: Secondary | ICD-10-CM | POA: Diagnosis not present

## 2012-05-18 ENCOUNTER — Telehealth: Payer: Self-pay | Admitting: *Deleted

## 2012-05-18 MED ORDER — ZOLPIDEM TARTRATE 10 MG PO TABS: 10.0000 mg | ORAL_TABLET | Freq: Every evening | ORAL | Status: DC | PRN

## 2012-05-18 NOTE — Telephone Encounter (Signed)
Rf req for Zolpidem 10 mg 1 po qhs prn.  # 25 Ok to Rf?

## 2012-05-18 NOTE — Telephone Encounter (Signed)
OK to fill this prescription with additional refills x0 Thank you!  

## 2012-05-18 NOTE — Telephone Encounter (Signed)
Done

## 2012-05-20 DIAGNOSIS — R920 Mammographic microcalcification found on diagnostic imaging of breast: Secondary | ICD-10-CM | POA: Diagnosis not present

## 2012-05-24 DIAGNOSIS — D126 Benign neoplasm of colon, unspecified: Secondary | ICD-10-CM | POA: Diagnosis not present

## 2012-05-24 DIAGNOSIS — K648 Other hemorrhoids: Secondary | ICD-10-CM | POA: Diagnosis not present

## 2012-05-24 DIAGNOSIS — Z86010 Personal history of colonic polyps: Secondary | ICD-10-CM | POA: Diagnosis not present

## 2012-05-24 DIAGNOSIS — K5730 Diverticulosis of large intestine without perforation or abscess without bleeding: Secondary | ICD-10-CM | POA: Diagnosis not present

## 2012-06-29 DIAGNOSIS — H25099 Other age-related incipient cataract, unspecified eye: Secondary | ICD-10-CM | POA: Diagnosis not present

## 2012-06-29 DIAGNOSIS — H353190 Nonexudative age-related macular degeneration, unspecified eye, stage unspecified: Secondary | ICD-10-CM | POA: Diagnosis not present

## 2012-08-04 DIAGNOSIS — Z124 Encounter for screening for malignant neoplasm of cervix: Secondary | ICD-10-CM | POA: Diagnosis not present

## 2012-08-04 DIAGNOSIS — Z1212 Encounter for screening for malignant neoplasm of rectum: Secondary | ICD-10-CM | POA: Diagnosis not present

## 2012-08-04 DIAGNOSIS — Z01419 Encounter for gynecological examination (general) (routine) without abnormal findings: Secondary | ICD-10-CM | POA: Diagnosis not present

## 2012-08-10 ENCOUNTER — Ambulatory Visit (INDEPENDENT_AMBULATORY_CARE_PROVIDER_SITE_OTHER): Payer: Medicare Other | Admitting: Internal Medicine

## 2012-08-10 ENCOUNTER — Encounter: Payer: Self-pay | Admitting: Internal Medicine

## 2012-08-10 VITALS — BP 130/80 | HR 80 | Temp 98.1°F | Resp 16 | Ht 61.0 in | Wt 134.0 lb

## 2012-08-10 DIAGNOSIS — R5381 Other malaise: Secondary | ICD-10-CM

## 2012-08-10 DIAGNOSIS — G4700 Insomnia, unspecified: Secondary | ICD-10-CM | POA: Diagnosis not present

## 2012-08-10 DIAGNOSIS — E785 Hyperlipidemia, unspecified: Secondary | ICD-10-CM

## 2012-08-10 DIAGNOSIS — N179 Acute kidney failure, unspecified: Secondary | ICD-10-CM

## 2012-08-10 DIAGNOSIS — F3289 Other specified depressive episodes: Secondary | ICD-10-CM

## 2012-08-10 DIAGNOSIS — I10 Essential (primary) hypertension: Secondary | ICD-10-CM

## 2012-08-10 DIAGNOSIS — M810 Age-related osteoporosis without current pathological fracture: Secondary | ICD-10-CM

## 2012-08-10 DIAGNOSIS — R5383 Other fatigue: Secondary | ICD-10-CM

## 2012-08-10 DIAGNOSIS — F329 Major depressive disorder, single episode, unspecified: Secondary | ICD-10-CM

## 2012-08-10 MED ORDER — VITAMIN B-12 1000 MCG PO TABS: 1000.0000 ug | ORAL_TABLET | ORAL | Status: DC

## 2012-08-10 MED ORDER — VITAMIN B-12 1000 MCG PO TABS: 1000.0000 ug | ORAL_TABLET | Freq: Every day | ORAL | Status: DC

## 2012-08-10 MED ORDER — VITAMIN D 1000 UNITS PO TABS: 1000.0000 [IU] | ORAL_TABLET | Freq: Every day | ORAL | Status: DC

## 2012-08-10 NOTE — Assessment & Plan Note (Signed)
Continue with current prescription therapy as reflected on the Med list.  

## 2012-08-10 NOTE — Progress Notes (Signed)
    Subjective:    HPI  F/u HTN, insomnia, fatigue, UTI Feels better No fatigue Sleeping better  Ureter bx was ok  Past Medical History  Diagnosis Date  . Hemangioma of intra-abdominal structures   . Renal cyst   . Shingles   . Paresthesia   . Heart murmur   . Dyslipidemia   . GERD (gastroesophageal reflux disease)   . Osteoporosis   . Insomnia   . Depression   . HTN (hypertension)   . LBP (low back pain)     Review of Systems  Constitutional: Positive for activity change. Negative for unexpected weight change.  HENT: Negative for neck pain.   Cardiovascular: Negative for chest pain.  Gastrointestinal: Positive for nausea. Negative for abdominal pain and constipation.  Genitourinary: Negative for dysuria, frequency, flank pain and difficulty urinating.  Musculoskeletal: Negative for myalgias, back pain and gait problem.  Psychiatric/Behavioral: Positive for sleep disturbance. The patient is nervous/anxious.        Objective:   Physical Exam  Constitutional: She appears well-nourished.  Skin: No rash noted.   BP 130/80  Pulse 80  Temp(Src) 98.1 F (36.7 C) (Oral)  Resp 16  Ht 5\' 1"  (1.549 m)  Wt 134 lb (60.782 kg)  BMI 25.33 kg/m2  Constitutional: She appears fatigued but less ill than last week, well-developed and well-nourished. No acute distress. Spouse at side Neck: Normal range of motion. Neck supple. No JVD or LAD present. No thyromegaly present.  Cardiovascular: fast rate but regular rhythm, normal heart sounds.  No murmur heard. No BLE edema. Pulmonary/Chest: Effort normal and breath sounds normal. No respiratory distress. She has no wheezes.  Abdominal: Soft. Bowel sounds are present. She exhibits no distension. There is no tenderness, rebound or gaurding. no masses Neurological: She is alert and oriented to person, place, and time. No cranial nerve deficit. Coordination normal.  Skin: Skin is warm and dry. No rash noted. No erythema.   Psychiatric: pt seem slightly anxious and despondent, but smiles at jokes. her behavior is normal. Judgment and thought content normal.    Lab Results  Component Value Date   WBC 8.2 02/09/2012   HGB 13.6 02/09/2012   HCT 41.8 02/09/2012   PLT 257.0 02/09/2012   GLUCOSE 94 02/09/2012   CHOL 240* 02/09/2012   TRIG 151.0* 02/09/2012   HDL 68.90 02/09/2012   LDLDIRECT 144.7 02/09/2012   LDLCALC 96 04/26/2007   ALT 11 02/09/2012   AST 18 02/09/2012   NA 142 02/09/2012   K 3.5 02/09/2012   CL 103 02/09/2012   CREATININE 0.9 02/09/2012   BUN 13 02/09/2012   CO2 29 02/09/2012   TSH 1.29 02/09/2012   INR 0.95 01/15/2012   HGBA1C 5.7 01/02/2011   CT a/p 12/04/11 reviewed - R PN, ?malignant ureter changes - suggest MRI follow up      Assessment & Plan:

## 2012-08-10 NOTE — Assessment & Plan Note (Addendum)
Chronic - Dr Jennette Kettle has been getting BDS's and addressing it Restart Vit D

## 2012-08-10 NOTE — Assessment & Plan Note (Signed)
  On diet  

## 2012-08-15 ENCOUNTER — Other Ambulatory Visit: Payer: Self-pay | Admitting: Internal Medicine

## 2012-10-11 ENCOUNTER — Other Ambulatory Visit (HOSPITAL_BASED_OUTPATIENT_CLINIC_OR_DEPARTMENT_OTHER): Payer: Self-pay | Admitting: Family Medicine

## 2012-10-11 ENCOUNTER — Ambulatory Visit (HOSPITAL_BASED_OUTPATIENT_CLINIC_OR_DEPARTMENT_OTHER)
Admission: RE | Admit: 2012-10-11 | Discharge: 2012-10-11 | Disposition: A | Discharge status: Home / Self Care | Payer: Medicare Other | Source: Ambulatory Visit | Attending: Family Medicine | Admitting: Family Medicine

## 2012-10-11 DIAGNOSIS — M79609 Pain in unspecified limb: Secondary | ICD-10-CM | POA: Diagnosis not present

## 2012-10-11 DIAGNOSIS — D4959 Neoplasm of unspecified behavior of other genitourinary organ: Secondary | ICD-10-CM | POA: Diagnosis not present

## 2012-10-11 DIAGNOSIS — M79604 Pain in right leg: Secondary | ICD-10-CM

## 2012-10-12 ENCOUNTER — Ambulatory Visit (INDEPENDENT_AMBULATORY_CARE_PROVIDER_SITE_OTHER): Payer: Medicare Other | Admitting: Internal Medicine

## 2012-10-12 ENCOUNTER — Encounter: Payer: Self-pay | Admitting: Internal Medicine

## 2012-10-12 ENCOUNTER — Other Ambulatory Visit (INDEPENDENT_AMBULATORY_CARE_PROVIDER_SITE_OTHER): Payer: Medicare Other

## 2012-10-12 VITALS — BP 128/78 | HR 81 | Temp 98.6°F | Ht 61.0 in | Wt 135.0 lb

## 2012-10-12 DIAGNOSIS — M79604 Pain in right leg: Secondary | ICD-10-CM

## 2012-10-12 DIAGNOSIS — M5430 Sciatica, unspecified side: Secondary | ICD-10-CM

## 2012-10-12 DIAGNOSIS — M79609 Pain in unspecified limb: Secondary | ICD-10-CM | POA: Diagnosis not present

## 2012-10-12 DIAGNOSIS — M5431 Sciatica, right side: Secondary | ICD-10-CM

## 2012-10-12 LAB — BASIC METABOLIC PANEL
Calcium: 9.2 mg/dL (ref 8.4–10.5)
GFR: 53.91 mL/min — ABNORMAL LOW (ref >60.00)
Glucose, Bld: 104 mg/dL — ABNORMAL HIGH (ref 70–99)
Potassium: 3.6 mEq/L (ref 3.5–5.1)
Sodium: 141 mEq/L (ref 135–145)

## 2012-10-12 LAB — CBC
HCT: 42.4 % (ref 36.0–46.0)
MCV: 82.1 fl (ref 78.0–100.0)
RBC: 5.16 Mil/uL — ABNORMAL HIGH (ref 3.87–5.11)
RDW: 13.6 % (ref 11.5–14.6)

## 2012-10-12 MED ORDER — PREDNISONE 10 MG PO TABS: ORAL_TABLET | ORAL | Status: DC

## 2012-10-12 NOTE — Progress Notes (Signed)
Subjective:    Patient ID: Cynthia Garza, female    DOB: 06-06-1932, 79 y.o.   MRN: [id number]  HPI  Pt presents to the clinic today with c/o right leg pain x 5 days. This occurred right after coming back from a trip to [place] and [place]. She was concerned that she may have a blood clot so she went to urgent where they did an ultrasound which was negative for DVT. The pain is constant. She reports it feels the same as when she had the shingles but she does not have a rash. She has taken oxycodone which has not helped. She has been elevating it and putting a heating pad on it which has helped some. She denies an injury to the area.  Review of Systems      Past Medical History  Diagnosis Date  . Hemangioma of intra-abdominal structures   . Renal cyst   . Shingles   . Paresthesia   . Heart murmur   . Dyslipidemia   . GERD (gastroesophageal reflux disease)   . Osteoporosis   . Insomnia   . Depression   . HTN (hypertension)   . LBP (low back pain)     Current Outpatient Prescriptions  Medication Sig Dispense Refill  . aspirin 81 MG tablet Take 81 mg by mouth daily.      Marland Kitchen buPROPion (WELLBUTRIN XL) 150 MG 24 hr tablet Take 150 mg by mouth every morning.      . Carboxymethylcellul-Glycerin (OPTIVE) 0.5-0.9 % SOLN Place 1-2 drops into both eyes at bedtime. For dry eyes      . cholecalciferol (VITAMIN D) 1000 UNITS tablet Take 1 tablet (1,000 Units total) by mouth daily.  100 tablet  3  . hydrOXYzine (ATARAX/VISTARIL) 25 MG tablet TAKE 1 TABLET (25 MG TOTAL) BY MOUTH EVERY 8 (EIGHT) HOURS AS NEEDED FOR ITCHING.  60 tablet  0  . loperamide (IMODIUM) 2 MG capsule Take 2 mg by mouth as needed.      Marland Kitchen losartan-hydrochlorothiazide (HYZAAR) 100-25 MG per tablet Take 1 tablet by mouth daily at 12 noon.   90 tablet  0  . losartan-hydrochlorothiazide (HYZAAR) 100-25 MG per tablet TAKE 1 TABLET BY MOUTH DAILY.  90 tablet  1  . ranitidine (ZANTAC) 150 MG tablet Take 150 mg by mouth 2  (two) times daily.      Marland Kitchen triamcinolone cream (KENALOG) 0.5 % Apply topically 3 (three) times daily.  120 g  1  . vitamin B-12 (CYANOCOBALAMIN) 1000 MCG tablet Take 1 tablet (1,000 mcg total) by mouth once a week.  100 tablet  3  . zolpidem (AMBIEN) 10 MG tablet Take 1 tablet (10 mg total) by mouth at bedtime as needed. For sleep  90 tablet  0   No current facility-administered medications for this visit.    Allergies  Allergen Reactions  . Simvastatin Other (See Comments)    aches  . Diflucan (Fluconazole) Rash    Family History  Problem Relation Age of Onset  . Kidney failure Mother   . Cancer Father   . Hypertension Father   . Coronary artery disease Neg Hx     History   Social History  . Marital Status: Married    Spouse Name: N/A    Number of Children: N/A  . Years of Education: N/A   Occupational History  . Retired    Social History Main Topics  . Smoking status: Never Smoker   . Smokeless tobacco: Not on file  .  Alcohol Use: No  . Drug Use: No  . Sexually Active: Not on file   Other Topics Concern  . Not on file   Social History Narrative  . No narrative on file     Constitutional: Denies fever, malaise, fatigue, headache or abrupt weight changes.  Musculoskeletal: Pt reports right leg pain. Denies decrease in range of motion, difficulty with gait, or joint pain and swelling.  Skin: Denies redness, rashes, lesions or ulcercations.  Neurological: Denies dizziness, difficulty with memory, difficulty with speech or problems with balance and coordination.   No other specific complaints in a complete review of systems (except as listed in HPI above).  Objective:   Physical Exam  BP 128/78  Pulse 81  Temp(Src) 98.6 F (37 C) (Oral)  Ht 5\' 1"  (1.549 m)  Wt 135 lb (61.236 kg)  BMI 25.52 kg/m2  SpO2 97% Wt Readings from Last 3 Encounters:  10/12/12 135 lb (61.236 kg)  08/10/12 134 lb (60.782 kg)  02/09/12 132 lb 0.6 oz (59.893 kg)    General:  Appear her stated age, well developed, well nourished in NAD. Skin: Warm, dry and intact. No rashes, lesions or ulcerations noted. Cardiovascular: Normal rate and rhythm. S1,S2 noted.  No murmur, rubs or gallops noted. No JVD or BLE edema. No carotid bruits noted. Distal pulses intact. Pulmonary/Chest: Normal effort and positive vesicular breath sounds. No respiratory distress. No wheezes, rales or ronchi noted.  Musculoskeletal: Normal range of motion. No signs of joint swelling. No difficulty with gait.  Neurological: Alert and oriented. Cranial nerves II-XII intact. Coordination normal. +DTRs bilaterally. Positive straight leg raise.        Assessment & Plan:   Pain in right leg, likely sciatica, new onset:  Will check labs to r/o electrolyte imbalance No evidence of shingles at this time, continue to look for rash and call back if one develops eRx for pred taper at this time  RTC as needed or if symptoms persist or worsen

## 2012-10-12 NOTE — Patient Instructions (Signed)

## 2012-10-22 ENCOUNTER — Encounter: Payer: Self-pay | Admitting: Internal Medicine

## 2012-10-22 ENCOUNTER — Ambulatory Visit (INDEPENDENT_AMBULATORY_CARE_PROVIDER_SITE_OTHER)
Admission: RE | Admit: 2012-10-22 | Discharge: 2012-10-22 | Disposition: A | Discharge status: Home / Self Care | Payer: Medicare Other | Source: Ambulatory Visit | Attending: Internal Medicine | Admitting: Internal Medicine

## 2012-10-22 ENCOUNTER — Ambulatory Visit (HOSPITAL_COMMUNITY): Payer: Medicare Other

## 2012-10-22 ENCOUNTER — Ambulatory Visit (INDEPENDENT_AMBULATORY_CARE_PROVIDER_SITE_OTHER): Payer: Medicare Other | Admitting: Internal Medicine

## 2012-10-22 VITALS — BP 160/78 | HR 80 | Temp 98.4°F | Resp 16 | Wt 132.0 lb

## 2012-10-22 DIAGNOSIS — M5430 Sciatica, unspecified side: Secondary | ICD-10-CM

## 2012-10-22 DIAGNOSIS — M25551 Pain in right hip: Secondary | ICD-10-CM

## 2012-10-22 DIAGNOSIS — M549 Dorsalgia, unspecified: Secondary | ICD-10-CM | POA: Diagnosis not present

## 2012-10-22 DIAGNOSIS — M5431 Sciatica, right side: Secondary | ICD-10-CM

## 2012-10-22 DIAGNOSIS — M25559 Pain in unspecified hip: Secondary | ICD-10-CM | POA: Diagnosis not present

## 2012-10-22 MED ORDER — TRAMADOL HCL 50 MG PO TABS: 50.0000 mg | ORAL_TABLET | Freq: Two times a day (BID) | ORAL | Status: DC | PRN

## 2012-10-22 NOTE — Assessment & Plan Note (Signed)
Xray Stretch See meds

## 2012-10-22 NOTE — Progress Notes (Signed)
Subjective:    HPI  C/o R leg pain - worse  - sciatica like...x 2wks. She had a RLE [place] - no DVT  F/u HTN, insomnia, fatigue, UTI Feels better No fatigue Sleeping better  Ureter bx was ok  Past Medical History  Diagnosis Date  . Hemangioma of intra-abdominal structures   . Renal cyst   . Shingles   . Paresthesia   . Heart murmur   . Dyslipidemia   . GERD (gastroesophageal reflux disease)   . Osteoporosis   . Insomnia   . Depression   . HTN (hypertension)   . LBP (low back pain)     Review of Systems  Constitutional: Positive for activity change. Negative for unexpected weight change.  HENT: Negative for neck pain.   Cardiovascular: Negative for chest pain.  Gastrointestinal: Positive for nausea. Negative for abdominal pain and constipation.  Genitourinary: Negative for dysuria, frequency, flank pain and difficulty urinating.  Musculoskeletal: Negative for myalgias, back pain and gait problem.  Psychiatric/Behavioral: Positive for sleep disturbance. The patient is nervous/anxious.        Objective:   Physical Exam  Constitutional: She appears well-nourished.  Skin: No rash noted.   BP 160/78  Pulse 80  Temp(Src) 98.4 F (36.9 C) (Oral)  Resp 16  Wt 132 lb (59.875 kg)  BMI 24.95 kg/m2  Constitutional: She appears fatigued but less ill than last week, well-developed and well-nourished. No acute distress. Spouse at side Neck: Normal range of motion. Neck supple. No JVD or LAD present. No thyromegaly present.  Cardiovascular: fast rate but regular rhythm, normal heart sounds.  No murmur heard. No BLE edema. Pulmonary/Chest: Effort normal and breath sounds normal. No respiratory distress. She has no wheezes.  Abdominal: Soft. Bowel sounds are present. She exhibits no distension. There is no tenderness, rebound or gaurding. no masses Neurological: She is alert and oriented to person, place, and time. No cranial nerve deficit. Coordination normal.  Skin: Skin  is warm and dry. No rash noted. No erythema.  Psychiatric: pt seem slightly anxious and despondent, but smiles at jokes. her behavior is normal. Judgment and thought content normal.    Lab Results  Component Value Date   WBC 9.9 10/12/2012   HGB 14.7 10/12/2012   HCT 42.4 10/12/2012   PLT 266.0 10/12/2012   GLUCOSE 104* 10/12/2012   CHOL 240* 02/09/2012   TRIG 151.0* 02/09/2012   HDL 68.90 02/09/2012   LDLDIRECT 144.7 02/09/2012   LDLCALC 96 04/26/2007   ALT 11 02/09/2012   AST 18 02/09/2012   NA 141 10/12/2012   K 3.6 10/12/2012   CL 102 10/12/2012   CREATININE 1.1 10/12/2012   BUN 20 10/12/2012   CO2 31 10/12/2012   TSH 1.29 02/09/2012   INR 0.95 01/15/2012   HGBA1C 5.7 01/02/2011    Procedure Note :     Procedure : Joint Injection,  R  hip   Indication:  Trochanteric bursitis with refractory  chronic pain.   Risks including unsuccessful procedure , bleeding, infection, bruising, skin atrophy and others were explained to the patient in detail as well as the benefits. Informed consent was obtained and signed.   Tthe patient was placed in a comfortable lateral decubitus position. The point of maximal tenderness was identified. Skin was prepped with Betadine and alcohol. Then, a 5 cc syringe with a 2 inch long 24-gauge needle was used for a bursa injection.. The needle was advanced  Into the bursa. I injected the bursa  with 4 mL of 2% lidocaine and 40 mg of Depo-Medrol .  Band-Aid was applied.   Tolerated well. Complications: None. Good pain relief following the procedure.   Postprocedure instructions :    A Band-Aid should be left on for 12 hours. Injection therapy is not a cure itself. It is used in conjunction with other modalities. You can use nonsteroidal anti-inflammatories like ibuprofen , hot and cold compresses. Rest is recommended in the next 24 hours. You need to report immediately  if fever, chills or any signs of infection develop.         Assessment & Plan:

## 2012-10-22 NOTE — Assessment & Plan Note (Signed)
Will inject °

## 2012-10-22 NOTE — Patient Instructions (Signed)
Postprocedure instructions :    A Band-Aid should be left on for 12 hours. Injection therapy is not a cure itself. It is used in conjunction with other modalities. You can use nonsteroidal anti-inflammatories like ibuprofen , hot and cold compresses. Rest is recommended in the next 24 hours. You need to report immediately  if fever, chills or any signs of infection develop. 

## 2012-10-23 ENCOUNTER — Other Ambulatory Visit: Payer: Self-pay | Admitting: Internal Medicine

## 2012-10-23 ENCOUNTER — Ambulatory Visit: Payer: Medicare Other

## 2012-10-23 ENCOUNTER — Encounter: Payer: Self-pay | Admitting: Internal Medicine

## 2012-10-23 DIAGNOSIS — M5431 Sciatica, right side: Secondary | ICD-10-CM

## 2012-10-23 MED ORDER — METHYLPREDNISOLONE ACETATE 80 MG/ML IJ SUSP: 80.0000 mg | Freq: Once | INTRAMUSCULAR | Status: DC

## 2012-10-26 ENCOUNTER — Encounter: Payer: Self-pay | Admitting: Internal Medicine

## 2012-11-09 ENCOUNTER — Ambulatory Visit: Payer: Medicare Other | Admitting: Internal Medicine

## 2012-11-11 ENCOUNTER — Other Ambulatory Visit: Payer: Self-pay | Admitting: Internal Medicine

## 2012-11-12 ENCOUNTER — Encounter: Payer: Self-pay | Admitting: Internal Medicine

## 2012-11-12 ENCOUNTER — Ambulatory Visit (INDEPENDENT_AMBULATORY_CARE_PROVIDER_SITE_OTHER): Payer: Medicare Other | Admitting: Internal Medicine

## 2012-11-12 VITALS — BP 160/78 | HR 80 | Temp 99.3°F | Resp 16 | Wt 132.0 lb

## 2012-11-12 DIAGNOSIS — M5430 Sciatica, unspecified side: Secondary | ICD-10-CM | POA: Diagnosis not present

## 2012-11-12 DIAGNOSIS — M5431 Sciatica, right side: Secondary | ICD-10-CM

## 2012-11-12 MED ORDER — HYDROCODONE-IBUPROFEN 7.5-200 MG PO TABS: 1.0000 | ORAL_TABLET | Freq: Three times a day (TID) | ORAL | Status: DC | PRN

## 2012-11-12 NOTE — Assessment & Plan Note (Addendum)
Ortho cons offered  LS Xray reviewed  D/c Tramadol Vicoprofen prn

## 2012-11-12 NOTE — Progress Notes (Signed)
Patient ID: Cynthia Garza, female   DOB: 11/17/1932, 79 y.o.   MRN: [id number]    Subjective:    HPI  C/o R leg pain - worse  - sciatica like...x 2wks. She had a RLE [place] - no DVT  F/u HTN, insomnia, fatigue, UTI Feels better No fatigue Sleeping better  Ureter bx was ok  Past Medical History  Diagnosis Date  . Hemangioma of intra-abdominal structures   . Renal cyst   . Shingles   . Paresthesia   . Heart murmur   . Dyslipidemia   . GERD (gastroesophageal reflux disease)   . Osteoporosis   . Insomnia   . Depression   . HTN (hypertension)   . LBP (low back pain)     Review of Systems  Constitutional: Positive for activity change. Negative for unexpected weight change.  HENT: Negative for neck pain.   Cardiovascular: Negative for chest pain.  Gastrointestinal: Positive for nausea. Negative for abdominal pain and constipation.  Genitourinary: Negative for dysuria, frequency, flank pain and difficulty urinating.  Musculoskeletal: Negative for myalgias, back pain and gait problem.  Psychiatric/Behavioral: Positive for sleep disturbance. The patient is nervous/anxious.        Objective:   Physical Exam  Constitutional: She appears well-nourished.  Skin: No rash noted.   BP 160/78  Pulse 80  Temp(Src) 99.3 F (37.4 C) (Oral)  Resp 16  Wt 132 lb (59.875 kg)  BMI 24.95 kg/m2  Constitutional: She appears fatigued but less ill than last week, well-developed and well-nourished. No acute distress. Spouse at side Neck: Normal range of motion. Neck supple. No JVD or LAD present. No thyromegaly present.  Cardiovascular: fast rate but regular rhythm, normal heart sounds.  No murmur heard. No BLE edema. Pulmonary/Chest: Effort normal and breath sounds normal. No respiratory distress. She has no wheezes.  Abdominal: Soft. Bowel sounds are present. She exhibits no distension. There is no tenderness, rebound or gaurding. no masses Neurological: She is alert and oriented to  person, place, and time. No cranial nerve deficit. Coordination normal.  Skin: Skin is warm and dry. No rash noted. No erythema.  Psychiatric: pt seem slightly anxious and despondent, but smiles at jokes. her behavior is normal. Judgment and thought content normal.    Lab Results  Component Value Date   WBC 9.9 10/12/2012   HGB 14.7 10/12/2012   HCT 42.4 10/12/2012   PLT 266.0 10/12/2012   GLUCOSE 104* 10/12/2012   CHOL 240* 02/09/2012   TRIG 151.0* 02/09/2012   HDL 68.90 02/09/2012   LDLDIRECT 144.7 02/09/2012   LDLCALC 96 04/26/2007   ALT 11 02/09/2012   AST 18 02/09/2012   NA 141 10/12/2012   K 3.6 10/12/2012   CL 102 10/12/2012   CREATININE 1.1 10/12/2012   BUN 20 10/12/2012   CO2 31 10/12/2012   TSH 1.29 02/09/2012   INR 0.95 01/15/2012   HGBA1C 5.7 01/02/2011      Assessment & Plan:

## 2012-11-16 ENCOUNTER — Other Ambulatory Visit: Payer: Self-pay | Admitting: Orthopedic Surgery

## 2012-11-16 DIAGNOSIS — M25569 Pain in unspecified knee: Secondary | ICD-10-CM | POA: Diagnosis not present

## 2012-11-16 DIAGNOSIS — M545 Low back pain: Secondary | ICD-10-CM

## 2012-11-18 ENCOUNTER — Ambulatory Visit
Admission: RE | Admit: 2012-11-18 | Discharge: 2012-11-18 | Disposition: A | Discharge status: Home / Self Care | Payer: Medicare Other | Source: Ambulatory Visit | Attending: Orthopedic Surgery | Admitting: Orthopedic Surgery

## 2012-11-18 DIAGNOSIS — M48061 Spinal stenosis, lumbar region without neurogenic claudication: Secondary | ICD-10-CM | POA: Diagnosis not present

## 2012-11-18 DIAGNOSIS — Z09 Encounter for follow-up examination after completed treatment for conditions other than malignant neoplasm: Secondary | ICD-10-CM | POA: Diagnosis not present

## 2012-11-18 DIAGNOSIS — M5137 Other intervertebral disc degeneration, lumbosacral region: Secondary | ICD-10-CM | POA: Diagnosis not present

## 2012-11-18 DIAGNOSIS — M545 Low back pain: Secondary | ICD-10-CM

## 2012-11-18 DIAGNOSIS — R920 Mammographic microcalcification found on diagnostic imaging of breast: Secondary | ICD-10-CM | POA: Diagnosis not present

## 2012-11-19 ENCOUNTER — Other Ambulatory Visit: Payer: Medicare Other

## 2012-11-21 ENCOUNTER — Other Ambulatory Visit: Payer: Medicare Other

## 2012-11-23 DIAGNOSIS — M25569 Pain in unspecified knee: Secondary | ICD-10-CM | POA: Diagnosis not present

## 2013-02-14 ENCOUNTER — Ambulatory Visit (INDEPENDENT_AMBULATORY_CARE_PROVIDER_SITE_OTHER): Payer: Medicare Other | Admitting: Internal Medicine

## 2013-02-14 ENCOUNTER — Encounter: Payer: Self-pay | Admitting: Internal Medicine

## 2013-02-14 VITALS — BP 140/80 | HR 80 | Temp 98.1°F | Resp 16 | Wt 136.0 lb

## 2013-02-14 DIAGNOSIS — E785 Hyperlipidemia, unspecified: Secondary | ICD-10-CM

## 2013-02-14 DIAGNOSIS — R42 Dizziness and giddiness: Secondary | ICD-10-CM

## 2013-02-14 DIAGNOSIS — Z23 Encounter for immunization: Secondary | ICD-10-CM | POA: Diagnosis not present

## 2013-02-14 DIAGNOSIS — I10 Essential (primary) hypertension: Secondary | ICD-10-CM | POA: Diagnosis not present

## 2013-02-14 MED ORDER — ZOLPIDEM TARTRATE 10 MG PO TABS: 10.0000 mg | ORAL_TABLET | Freq: Every evening | ORAL | Status: DC | PRN

## 2013-02-14 MED ORDER — MECLIZINE HCL 12.5 MG PO TABS: 12.5000 mg | ORAL_TABLET | Freq: Three times a day (TID) | ORAL | Status: DC | PRN

## 2013-02-14 MED ORDER — PROMETHAZINE HCL 25 MG/ML IJ SOLN
50.0000 mg | Freq: Once | INTRAMUSCULAR | Status: AC
  Administered 2013-02-14: 50 mg via INTRAMUSCULAR

## 2013-02-14 NOTE — Assessment & Plan Note (Addendum)
BPV recurrent R (+)

## 2013-02-14 NOTE — Patient Instructions (Addendum)
Benign Positional Vertigo symptoms on the right Start Meclizine. Start Brandt - Daroff exercise several times a day as dirrected.  

## 2013-02-14 NOTE — Assessment & Plan Note (Signed)
Continue with current prescription therapy as reflected on the Med list.  

## 2013-02-14 NOTE — Progress Notes (Signed)
    Subjective:    HPI  C/o dizzy spells since last night  F/u HTN, insomnia, fatigue, UTI  Sleeping better  Ureter bx was ok  Past Medical History  Diagnosis Date  . Hemangioma of intra-abdominal structures   . Renal cyst   . Shingles   . Paresthesia   . Heart murmur   . Dyslipidemia   . GERD (gastroesophageal reflux disease)   . Osteoporosis   . Insomnia   . Depression   . HTN (hypertension)   . LBP (low back pain)     Review of Systems  Constitutional: Positive for activity change. Negative for unexpected weight change.  HENT: Negative for neck pain.   Cardiovascular: Negative for chest pain.  Gastrointestinal: Positive for nausea. Negative for abdominal pain and constipation.  Genitourinary: Negative for dysuria, frequency, flank pain and difficulty urinating.  Musculoskeletal: Negative for myalgias, back pain and gait problem.  Psychiatric/Behavioral: Positive for sleep disturbance. The patient is nervous/anxious.        Objective:   Physical Exam  Constitutional: She appears well-nourished.  Skin: No rash noted.   BP 140/80  Pulse 80  Temp(Src) 98.1 F (36.7 C) (Oral)  Resp 16  Wt 136 lb (61.689 kg)  BMI 25.71 kg/m2  Constitutional: She appears fatigued but less ill than last week, well-developed and well-nourished. No acute distress. Spouse at side Neck: Normal range of motion. Neck supple. No JVD or LAD present. No thyromegaly present.  Cardiovascular: fast rate but regular rhythm, normal heart sounds.  No murmur heard. No BLE edema. Pulmonary/Chest: Effort normal and breath sounds normal. No respiratory distress. She has no wheezes.  Abdominal: Soft. Bowel sounds are present. She exhibits no distension. There is no tenderness, rebound or gaurding. no masses Neurological: She is alert and oriented to person, place, and time. No cranial nerve deficit. Coordination normal.  Skin: Skin is warm and dry. No rash noted. No erythema.  Psychiatric: pt  seem slightly anxious and despondent, but smiles at jokes. her behavior is normal. Judgment and thought content normal.  H-P was (+) on R  Lab Results  Component Value Date   WBC 9.9 10/12/2012   HGB 14.7 10/12/2012   HCT 42.4 10/12/2012   PLT 266.0 10/12/2012   GLUCOSE 104* 10/12/2012   CHOL 240* 02/09/2012   TRIG 151.0* 02/09/2012   HDL 68.90 02/09/2012   LDLDIRECT 144.7 02/09/2012   LDLCALC 96 04/26/2007   ALT 11 02/09/2012   AST 18 02/09/2012   NA 141 10/12/2012   K 3.6 10/12/2012   CL 102 10/12/2012   CREATININE 1.1 10/12/2012   BUN 20 10/12/2012   CO2 31 10/12/2012   TSH 1.29 02/09/2012   INR 0.95 01/15/2012   HGBA1C 5.7 01/02/2011      Assessment & Plan:

## 2013-03-04 ENCOUNTER — Other Ambulatory Visit: Payer: Self-pay | Admitting: Internal Medicine

## 2013-05-16 ENCOUNTER — Encounter: Payer: Self-pay | Admitting: Internal Medicine

## 2013-05-16 ENCOUNTER — Ambulatory Visit (INDEPENDENT_AMBULATORY_CARE_PROVIDER_SITE_OTHER): Payer: Medicare Other | Admitting: Internal Medicine

## 2013-05-16 VITALS — BP 140/74 | HR 80 | Temp 98.3°F | Resp 16 | Wt 136.0 lb

## 2013-05-16 DIAGNOSIS — Z0000 Encounter for general adult medical examination without abnormal findings: Secondary | ICD-10-CM

## 2013-05-16 DIAGNOSIS — R413 Other amnesia: Secondary | ICD-10-CM

## 2013-05-16 DIAGNOSIS — I10 Essential (primary) hypertension: Secondary | ICD-10-CM | POA: Diagnosis not present

## 2013-05-16 DIAGNOSIS — E785 Hyperlipidemia, unspecified: Secondary | ICD-10-CM

## 2013-05-16 DIAGNOSIS — F329 Major depressive disorder, single episode, unspecified: Secondary | ICD-10-CM

## 2013-05-16 DIAGNOSIS — R5381 Other malaise: Secondary | ICD-10-CM

## 2013-05-16 DIAGNOSIS — F3289 Other specified depressive episodes: Secondary | ICD-10-CM

## 2013-05-16 DIAGNOSIS — Z23 Encounter for immunization: Secondary | ICD-10-CM

## 2013-05-16 DIAGNOSIS — R42 Dizziness and giddiness: Secondary | ICD-10-CM

## 2013-05-16 DIAGNOSIS — M810 Age-related osteoporosis without current pathological fracture: Secondary | ICD-10-CM

## 2013-05-16 NOTE — Assessment & Plan Note (Signed)
Continue with current prescription therapy as reflected on the Med list.  

## 2013-05-16 NOTE — Assessment & Plan Note (Signed)
Resolved

## 2013-05-16 NOTE — Assessment & Plan Note (Signed)
Vit D 

## 2013-05-16 NOTE — Progress Notes (Signed)
    Subjective:    HPI  F/u dizzy spells since last night  F/u HTN, insomnia, fatigue, UTI  Sleeping better  Ureter bx was ok  Past Medical History  Diagnosis Date  . Hemangioma of intra-abdominal structures   . Renal cyst   . Shingles   . Paresthesia   . Heart murmur   . Dyslipidemia   . GERD (gastroesophageal reflux disease)   . Osteoporosis   . Insomnia   . Depression   . HTN (hypertension)   . LBP (low back pain)     Review of Systems  Constitutional: Positive for activity change. Negative for unexpected weight change.  Cardiovascular: Negative for chest pain.  Gastrointestinal: Positive for nausea. Negative for abdominal pain and constipation.  Genitourinary: Negative for dysuria, frequency, flank pain and difficulty urinating.  Musculoskeletal: Negative for back pain, gait problem, myalgias and neck pain.  Psychiatric/Behavioral: Positive for sleep disturbance. The patient is nervous/anxious.        Objective:   Physical Exam  Constitutional: She appears well-nourished.  Skin: No rash noted.   BP 140/74  Pulse 80  Temp(Src) 98.3 F (36.8 C) (Oral)  Resp 16  Wt 136 lb (61.689 kg)  Constitutional: She appears fatigued but less ill than last week, well-developed and well-nourished. No acute distress. Spouse at side Neck: Normal range of motion. Neck supple. No JVD or LAD present. No thyromegaly present.  Cardiovascular: fast rate but regular rhythm, normal heart sounds.  No murmur heard. No BLE edema. Pulmonary/Chest: Effort normal and breath sounds normal. No respiratory distress. She has no wheezes.  Abdominal: Soft. Bowel sounds are present. She exhibits no distension. There is no tenderness, rebound or gaurding. no masses Neurological: She is alert and oriented to person, place, and time. No cranial nerve deficit. Coordination normal.  Skin: Skin is warm and dry. No rash noted. No erythema.  Psychiatric: pt seem slightly anxious and despondent, but  smiles at jokes. her behavior is normal. Judgment and thought content normal.    Lab Results  Component Value Date   WBC 9.9 10/12/2012   HGB 14.7 10/12/2012   HCT 42.4 10/12/2012   PLT 266.0 10/12/2012   GLUCOSE 104* 10/12/2012   CHOL 240* 02/09/2012   TRIG 151.0* 02/09/2012   HDL 68.90 02/09/2012   LDLDIRECT 144.7 02/09/2012   LDLCALC 96 04/26/2007   ALT 11 02/09/2012   AST 18 02/09/2012   NA 141 10/12/2012   K 3.6 10/12/2012   CL 102 10/12/2012   CREATININE 1.1 10/12/2012   BUN 20 10/12/2012   CO2 31 10/12/2012   TSH 1.29 02/09/2012   INR 0.95 01/15/2012   HGBA1C 5.7 01/02/2011      Assessment & Plan:

## 2013-05-16 NOTE — Assessment & Plan Note (Signed)
Doing well 

## 2013-05-16 NOTE — Addendum Note (Signed)
Addended by: Merrilyn Puma on: 05/16/2013 01:51 PM   Modules accepted: Orders

## 2013-05-16 NOTE — Progress Notes (Signed)
Pre visit review using our clinic review tool, if applicable. No additional management support is needed unless otherwise documented below in the visit note. 

## 2013-05-16 NOTE — Addendum Note (Signed)
Addended by: Tresa Garter on: 05/16/2013 11:06 AM   Modules accepted: Orders

## 2013-05-24 DIAGNOSIS — R928 Other abnormal and inconclusive findings on diagnostic imaging of breast: Secondary | ICD-10-CM | POA: Diagnosis not present

## 2013-05-24 DIAGNOSIS — N63 Unspecified lump in unspecified breast: Secondary | ICD-10-CM | POA: Diagnosis not present

## 2013-05-26 DIAGNOSIS — C50919 Malignant neoplasm of unspecified site of unspecified female breast: Secondary | ICD-10-CM

## 2013-05-26 HISTORY — DX: Malignant neoplasm of unspecified site of unspecified female breast: C50.919

## 2013-05-30 ENCOUNTER — Other Ambulatory Visit: Payer: Self-pay | Admitting: Radiology

## 2013-05-30 DIAGNOSIS — C50919 Malignant neoplasm of unspecified site of unspecified female breast: Secondary | ICD-10-CM | POA: Diagnosis not present

## 2013-05-30 DIAGNOSIS — D0590 Unspecified type of carcinoma in situ of unspecified breast: Secondary | ICD-10-CM | POA: Diagnosis not present

## 2013-05-31 ENCOUNTER — Other Ambulatory Visit: Payer: Self-pay | Admitting: Radiology

## 2013-06-02 ENCOUNTER — Telehealth: Payer: Self-pay | Admitting: *Deleted

## 2013-06-02 DIAGNOSIS — Z170 Estrogen receptor positive status [ER+]: Secondary | ICD-10-CM

## 2013-06-02 DIAGNOSIS — C50411 Malignant neoplasm of upper-outer quadrant of right female breast: Secondary | ICD-10-CM

## 2013-06-02 NOTE — Telephone Encounter (Signed)
Confirmed [facility] for 06/08/13 at 0900 .  Instructions and contact information given.

## 2013-06-03 ENCOUNTER — Ambulatory Visit
Admission: RE | Admit: 2013-06-03 | Discharge: 2013-06-03 | Disposition: A | Discharge status: Home / Self Care | Payer: Medicare Other | Source: Ambulatory Visit | Attending: Radiology | Admitting: Radiology

## 2013-06-03 DIAGNOSIS — C50919 Malignant neoplasm of unspecified site of unspecified female breast: Secondary | ICD-10-CM | POA: Diagnosis not present

## 2013-06-03 MED ORDER — GADOBENATE DIMEGLUMINE 529 MG/ML IV SOLN
12.0000 mL | Freq: Once | INTRAVENOUS | Status: AC | PRN
  Administered 2013-06-03: 12 mL via INTRAVENOUS

## 2013-06-07 ENCOUNTER — Other Ambulatory Visit: Payer: Self-pay | Admitting: Radiology

## 2013-06-07 DIAGNOSIS — C50919 Malignant neoplasm of unspecified site of unspecified female breast: Secondary | ICD-10-CM | POA: Diagnosis not present

## 2013-06-07 DIAGNOSIS — N63 Unspecified lump in unspecified breast: Secondary | ICD-10-CM | POA: Diagnosis not present

## 2013-06-08 ENCOUNTER — Ambulatory Visit: Payer: Medicare Other

## 2013-06-08 ENCOUNTER — Other Ambulatory Visit (HOSPITAL_BASED_OUTPATIENT_CLINIC_OR_DEPARTMENT_OTHER): Payer: Medicare Other

## 2013-06-08 ENCOUNTER — Ambulatory Visit
Admission: RE | Admit: 2013-06-08 | Discharge: 2013-06-08 | Disposition: A | Discharge status: Home / Self Care | Payer: BLUE CROSS/BLUE SHIELD | Source: Ambulatory Visit | Attending: Radiation Oncology | Admitting: Radiation Oncology

## 2013-06-08 ENCOUNTER — Ambulatory Visit (HOSPITAL_BASED_OUTPATIENT_CLINIC_OR_DEPARTMENT_OTHER): Payer: Medicare Other | Admitting: Oncology

## 2013-06-08 ENCOUNTER — Encounter: Payer: Self-pay | Admitting: Oncology

## 2013-06-08 ENCOUNTER — Encounter: Payer: Self-pay | Admitting: *Deleted

## 2013-06-08 ENCOUNTER — Other Ambulatory Visit: Payer: Self-pay | Admitting: Radiology

## 2013-06-08 ENCOUNTER — Ambulatory Visit: Payer: Medicare Other | Attending: General Surgery | Admitting: Physical Therapy

## 2013-06-08 ENCOUNTER — Encounter (INDEPENDENT_AMBULATORY_CARE_PROVIDER_SITE_OTHER): Payer: Self-pay | Admitting: General Surgery

## 2013-06-08 ENCOUNTER — Ambulatory Visit (HOSPITAL_BASED_OUTPATIENT_CLINIC_OR_DEPARTMENT_OTHER): Payer: Medicare Other | Admitting: General Surgery

## 2013-06-08 VITALS — BP 157/91 | HR 76 | Temp 98.0°F | Resp 20 | Ht 60.75 in | Wt 138.8 lb

## 2013-06-08 DIAGNOSIS — I10 Essential (primary) hypertension: Secondary | ICD-10-CM | POA: Diagnosis not present

## 2013-06-08 DIAGNOSIS — C50411 Malignant neoplasm of upper-outer quadrant of right female breast: Secondary | ICD-10-CM

## 2013-06-08 DIAGNOSIS — C50919 Malignant neoplasm of unspecified site of unspecified female breast: Secondary | ICD-10-CM | POA: Insufficient documentation

## 2013-06-08 DIAGNOSIS — R293 Abnormal posture: Secondary | ICD-10-CM | POA: Diagnosis not present

## 2013-06-08 DIAGNOSIS — IMO0001 Reserved for inherently not codable concepts without codable children: Secondary | ICD-10-CM | POA: Diagnosis not present

## 2013-06-08 DIAGNOSIS — C50419 Malignant neoplasm of upper-outer quadrant of unspecified female breast: Secondary | ICD-10-CM | POA: Diagnosis not present

## 2013-06-08 DIAGNOSIS — C50319 Malignant neoplasm of lower-inner quadrant of unspecified female breast: Secondary | ICD-10-CM | POA: Diagnosis not present

## 2013-06-08 DIAGNOSIS — M810 Age-related osteoporosis without current pathological fracture: Secondary | ICD-10-CM

## 2013-06-08 DIAGNOSIS — Z170 Estrogen receptor positive status [ER+]: Secondary | ICD-10-CM

## 2013-06-08 DIAGNOSIS — R928 Other abnormal and inconclusive findings on diagnostic imaging of breast: Secondary | ICD-10-CM

## 2013-06-08 LAB — CBC WITH DIFFERENTIAL/PLATELET
BASO%: 1.1 % (ref 0.0–2.0)
Basophils Absolute: 0.1 10*3/uL (ref 0.0–0.1)
EOS%: 1.6 % (ref 0.0–7.0)
Eosinophils Absolute: 0.1 10*3/uL (ref 0.0–0.5)
HCT: 40.4 % (ref 34.8–46.6)
HEMOGLOBIN: 13.9 g/dL (ref 11.6–15.9)
LYMPH#: 2.5 10*3/uL (ref 0.9–3.3)
LYMPH%: 28.2 % (ref 14.0–49.7)
MCH: 29 pg (ref 25.1–34.0)
MCHC: 34.4 g/dL (ref 31.5–36.0)
MCV: 84.2 fL (ref 79.5–101.0)
MONO#: 0.9 10*3/uL (ref 0.1–0.9)
MONO%: 10.7 % (ref 0.0–14.0)
NEUT#: 5.1 10*3/uL (ref 1.5–6.5)
NEUT%: 58.4 % (ref 38.4–76.8)
Platelets: 226 10*3/uL (ref 145–400)
RBC: 4.8 10*6/uL (ref 3.70–5.45)
RDW: 13.7 % (ref 11.2–14.5)
WBC: 8.7 10*3/uL (ref 3.9–10.3)

## 2013-06-08 LAB — COMPREHENSIVE METABOLIC PANEL (CC13)
ALBUMIN: 3.8 g/dL (ref 3.5–5.0)
ALK PHOS: 61 U/L (ref 40–150)
ALT: 9 U/L (ref 0–55)
AST: 13 U/L (ref 5–34)
Anion Gap: 11 mEq/L (ref 3–11)
BUN: 19.6 mg/dL (ref 7.0–26.0)
CALCIUM: 9.4 mg/dL (ref 8.4–10.4)
CHLORIDE: 106 meq/L (ref 98–109)
CO2: 24 mEq/L (ref 22–29)
Creatinine: 0.8 mg/dL (ref 0.6–1.1)
Glucose: 97 mg/dl (ref 70–140)
POTASSIUM: 3.5 meq/L (ref 3.5–5.1)
SODIUM: 141 meq/L (ref 136–145)
TOTAL PROTEIN: 6.7 g/dL (ref 6.4–8.3)
Total Bilirubin: 0.58 mg/dL (ref 0.20–1.20)

## 2013-06-08 NOTE — Progress Notes (Signed)
Patient ID: Cynthia Garza, female   DOB: [birth year], 80 y.o.   MRN: [id number]  No chief complaint on file.   HPI Cynthia Garza is a 80 y.o. female.  We're asked to see the patient in consultation by Dr. Marcelo Baldy to evaluate her for bilateral breast cancer. The patient is a 80 year old white female who recently went for a 6 month followup of calcifications seen previously in the left breast. At that time an abnormality was noted in the 12:00 position of the right breast as well. The mass in the right breast measured 1.5 cm by ultrasound and had 2 small satellite areas by MRI. The left breast lesion measured 7 mm by ultrasound and MRI. Both areas were biopsied and both came back as invasive breast cancer. The satellite lesions have not been biopsied on the right side yet. She denies any breast pain. She denies any discharge from her nipple. She does not take any female hormone replacement  HPI  Past Medical History  Diagnosis Date  . Hemangioma of intra-abdominal structures   . Renal cyst   . Shingles   . Paresthesia   . Heart murmur   . Dyslipidemia   . GERD (gastroesophageal reflux disease)   . Osteoporosis   . Insomnia   . Depression   . HTN (hypertension)   . LBP (low back pain)   . Anxiety     Past Surgical History  Procedure Laterality Date  . Abdominal hysterectomy      Family History  Problem Relation Age of Onset  . Kidney failure Mother   . Cancer Father   . Hypertension Father   . Coronary artery disease Neg Hx     Social History History  Substance Use Topics  . Smoking status: Never Smoker   . Smokeless tobacco: Not on file  . Alcohol Use: No    Allergies  Allergen Reactions  . Simvastatin Other (See Comments)    aches  . Diflucan [Fluconazole] Rash    Current Outpatient Prescriptions  Medication Sig Dispense Refill  . aspirin 81 MG tablet Take 81 mg by mouth daily.      Marland Kitchen buPROPion (WELLBUTRIN XL) 150 MG 24 hr tablet TAKE 1 TABLET (150 MG  TOTAL) BY MOUTH DAILY.  90 tablet  3  . Carboxymethylcellul-Glycerin (OPTIVE) 0.5-0.9 % SOLN Place 1-2 drops into both eyes at bedtime. For dry eyes      . cholecalciferol (VITAMIN D) 1000 UNITS tablet Take 1 tablet (1,000 Units total) by mouth daily.  100 tablet  3  . losartan-hydrochlorothiazide (HYZAAR) 100-25 MG per tablet TAKE 1 TABLET BY MOUTH DAILY.  90 tablet  1  . meclizine (ANTIVERT) 12.5 MG tablet Take 1 tablet (12.5 mg total) by mouth 3 (three) times daily as needed.  60 tablet  1  . predniSONE (DELTASONE) 10 MG tablet Take 3 tablets on days 1-2, take 2 tablets on days 3-4, take 1 tablet on days 5-6  12 tablet  0  . ranitidine (ZANTAC) 150 MG tablet Take 150 mg by mouth 2 (two) times daily.      . vitamin B-12 (CYANOCOBALAMIN) 1000 MCG tablet Take 1 tablet (1,000 mcg total) by mouth once a week.  100 tablet  3  . zolpidem (AMBIEN) 10 MG tablet Take 1 tablet (10 mg total) by mouth at bedtime as needed. For sleep  90 tablet  0   Current Facility-Administered Medications  Medication Dose Route Frequency Provider Last Rate Last Dose  . methylPREDNISolone  acetate (DEPO-MEDROL) injection 80 mg  80 mg Intra-articular Once Cassandria Anger, MD        Review of Systems Review of Systems  Constitutional: Negative.   HENT: Negative.   Eyes: Negative.   Respiratory: Negative.   Cardiovascular: Negative.   Gastrointestinal: Negative.   Endocrine: Negative.   Genitourinary: Negative.   Musculoskeletal: Negative.   Skin: Negative.   Allergic/Immunologic: Negative.   Neurological: Negative.   Hematological: Negative.   Psychiatric/Behavioral: Negative.     There were no vitals taken for this visit.  Physical Exam Physical Exam  Constitutional: She is oriented to person, place, and time. She appears well-developed and well-nourished.  HENT:  Head: Normocephalic and atraumatic.  Eyes: Conjunctivae and EOM are normal. Pupils are equal, round, and reactive to light.  Neck: Normal  range of motion. Neck supple.  Cardiovascular: Normal rate, regular rhythm and normal heart sounds.   Pulmonary/Chest: Effort normal and breath sounds normal.  There is no palpable mass in either breast. There is no palpable axillary, supraclavicular, or cervical lymphadenopathy  Abdominal: Soft. Bowel sounds are normal.  Musculoskeletal: Normal range of motion.  Lymphadenopathy:    She has no cervical adenopathy.  Neurological: She is alert and oriented to person, place, and time.  Skin: Skin is warm and dry.  Psychiatric: She has a normal mood and affect. Her behavior is normal.    Data Reviewed As above  Assessment    The patient appears to have bilateral breast cancer. We have talked in depth today about the different options for treatment. On the left side the lesion is small and she favors breast conservation I think is a reasonable choice. On the right side the satellite nodules have not been well defined yet. If the satellite nodules are negative then I think she would be a good candidate for breast conservation and she favors this treatment. If the satellite nodules are positive then I think she may be better served with a mastectomy on the right. She would need sentinel node mapping stump on both sides as well. I discussed with her in detail the risks and benefits of these operations as well as some of the technical aspects and she understands.     Plan    Plan for MRI guided biopsy on the right satellite lesion and then we will proceed accordingly.        TOTH III,PAUL S 06/08/2013, 11:34 AM

## 2013-06-08 NOTE — Progress Notes (Signed)
Cynthia Garza [id number] 30-Aug-1932 80 y.o. 06/08/2013 12:51 PM  CC  Walker Kehr, MD [address] [facility] [address] [place] [address] Dr. Autumn Messing Dr. Thea Silversmith  REASON FOR CONSULTATION:  80 year old female with new diagnosis of right breast cancer with a suspicious lesion on the left side. Patient is seen in the multidisciplinary breast clinic for discussion of treatment options  STAGE:   Breast cancer of upper-outer quadrant of right female breast   Primary site: Breast (Right)   Staging method: AJCC 7th Edition   Clinical: Stage IA (T1c, N0, cM0)   Summary: Stage IA (T1c, N0, cM0)  REFERRING PHYSICIAN: Dr. Autumn Messing  HISTORY OF PRESENT ILLNESS:  Cynthia Garza is a 79 y.o. female.  With multiple medical problems who underwent a six-month followup mammogram for left-sided abnormalities. On her mammogram she was noted to have a right breast mass at the 12:00 position. By ultrasound it measured 1.5 cm. Patient went on to have a biopsy performed of this mass. This revealed intermediate grade invasive ductal carcinoma ER positive PR positive HER-2/neu negative with a proliferation marker Ki-67 22%. She had MRI of the breasts performed. The MRI showed a 1.3 cm enhancement known for the by biopsy with 2 small satellite lesion. She was also noted to have a another linear enhancement as well in the right breast. On the left side she was noted to have a 7 mm mass by ultrasound this measured 8 mm this was at the 7:00 position. Patient had a biopsy of this performed. By pathology report report this is consistent with an invasive cancer. Patient is seen in the multidisciplinary breast clinic for discussion of treatment options she is without any complaints. She is accompanied by her husband and daughter. She was also seen by Dr. Thea Silversmith and Dr. Autumn Messing. Her case was discussed at the multidisciplinary breast conference. Her pathology and radiology were  reviewed.   Past Medical History: Past Medical History  Diagnosis Date  . Hemangioma of intra-abdominal structures   . Renal cyst   . Shingles   . Paresthesia   . Heart murmur   . Dyslipidemia   . GERD (gastroesophageal reflux disease)   . Osteoporosis   . Insomnia   . Depression   . HTN (hypertension)   . LBP (low back pain)   . Anxiety     Past Surgical History: Past Surgical History  Procedure Laterality Date  . Abdominal hysterectomy      Family History: Family History  Problem Relation Age of Onset  . Kidney failure Mother   . Cancer Father   . Hypertension Father   . Coronary artery disease Neg Hx     Social History History  Substance Use Topics  . Smoking status: Never Smoker   . Smokeless tobacco: Not on file  . Alcohol Use: No    Allergies: Allergies  Allergen Reactions  . Simvastatin Other (See Comments)    aches  . Diflucan [Fluconazole] Rash    Current Medications: Current Outpatient Prescriptions  Medication Sig Dispense Refill  . buPROPion (WELLBUTRIN XL) 150 MG 24 hr tablet TAKE 1 TABLET (150 MG TOTAL) BY MOUTH DAILY.  90 tablet  3  . Carboxymethylcellul-Glycerin (OPTIVE) 0.5-0.9 % SOLN Place 1-2 drops into both eyes at bedtime. For dry eyes      . losartan-hydrochlorothiazide (HYZAAR) 100-25 MG per tablet TAKE 1 TABLET BY MOUTH DAILY.  90 tablet  1  . ranitidine (ZANTAC)  150 MG tablet Take 150 mg by mouth 2 (two) times daily.      Marland Kitchen zolpidem (AMBIEN) 10 MG tablet Take 1 tablet (10 mg total) by mouth at bedtime as needed. For sleep  90 tablet  0  . aspirin 81 MG tablet Take 81 mg by mouth daily.      . cholecalciferol (VITAMIN D) 1000 UNITS tablet Take 1 tablet (1,000 Units total) by mouth daily.  100 tablet  3  . meclizine (ANTIVERT) 12.5 MG tablet Take 1 tablet (12.5 mg total) by mouth 3 (three) times daily as needed.  60 tablet  1  . predniSONE (DELTASONE) 10 MG tablet Take 3 tablets on days 1-2, take 2 tablets on days 3-4, take 1  tablet on days 5-6  12 tablet  0  . vitamin B-12 (CYANOCOBALAMIN) 1000 MCG tablet Take 1 tablet (1,000 mcg total) by mouth once a week.  100 tablet  3   Current Facility-Administered Medications  Medication Dose Route Frequency Provider Last Rate Last Dose  . methylPREDNISolone acetate (DEPO-MEDROL) injection 80 mg  80 mg Intra-articular Once Cassandria Anger, MD        OB/GYN History: Menarche at age 66 she underwent menopause in 1977 no hormone replacement therapy. First live birth was at 70-1/78 years of age. She's had 2 term births.  Fertility Discussion: Not applicable Prior History of Cancer: No  Health Maintenance:  Colonoscopy yes Bone Density yes Last PAP smear January  ECOG PERFORMANCE STATUS: 0 - Asymptomatic  Genetic Counseling/testing: yes/ possible bilat breast cancers  REVIEW OF SYSTEMS:  Comprehensive 14 point review of system was obtained and it is scant separately into the electronic medical record  PHYSICAL EXAMINATION: Blood pressure 157/91, pulse 76, temperature 98 F (36.7 C), temperature source Oral, resp. rate 20, height 5' 0.75" (1.543 m), weight 138 lb 12.8 oz (62.959 kg).  MGN:OIBBC, healthy, no distress, well nourished and well developed SKIN: skin color, texture, turgor are normal HEAD: Normocephalic EYES: normal, PERRLA, EOMI, Conjunctiva are pink and non-injected EARS: External ears normal OROPHARYNX:no exudate, no erythema and lips, buccal mucosa, and tongue normal  NECK: supple, no adenopathy LYMPH:  no palpable lymphadenopathy, no hepatosplenomegaly BREAST:breasts appear normal, no suspicious masses, no skin or nipple changes or axillary nodes, area of ecchymosis on the bilateral breasts for biopsy LUNGS: clear to auscultation and percussion HEART: regular rate & rhythm ABDOMEN:abdomen soft, non-tender, normal bowel sounds and no masses or organomegaly BACK: Back symmetric, no curvature., No CVA tenderness EXTREMITIES:no edema  NEURO:  alert & oriented x 3 with fluent speech, no focal motor/sensory deficits, gait normal     STUDIES/RESULTS: Mr Breast Bilateral W Wo Contrast  06/03/2013   CLINICAL DATA:  80 year old female with recently diagnosed invasive ductal carcinoma of the right breast, a 1.5 cm lobulated mass was found in the right breast at 12 o'clock posterior depth.  EXAM: BILATERAL BREAST MRI WITH AND WITHOUT CONTRAST  LABS:  BUN and creatinine were obtained on site at [facility] at  [address]  Results:  BUN 16 mg/dL,  Creatinine 1 mg/dL.  TECHNIQUE: Multiplanar, multisequence MR images of both breasts were obtained prior to and following the intravenous administration of 76m of MultiHance.  THREE-DIMENSIONAL MR IMAGE RENDERING ON INDEPENDENT WORKSTATION:  Three-dimensional MR images were rendered by post-processing of the original MR data on an independent workstation. The three-dimensional MR images were interpreted, and findings are reported in the following complete MRI report for this study. Three dimensional  images were evaluated at the independent DynaCad workstation  COMPARISON:  Previous exams  FINDINGS: Breast composition: c:  Heterogeneous fibroglandular tissue  Background parenchymal enhancement: Moderate  Right breast: Biopsy clip artifact is present in the superior right breast at 12 o'clock at site of known biopsy proven malignancy with the dominant mass measuring 0.8 x 1.2 x 1.3 cm. There is surrounding non mass enhancement, some of which may represent biopsy related changes. Two small probable satellite nodules together measuring up to 0.6 cm are present approximately 1.5 cm medial to the dominant mass. There is an indeterminate 6 mm linear focus of enhancement located approximately 2.6 cm anterior inferior to the dominant mass.  Left breast: There is a mildly irregular enhancing mass within the lower slightly inner left breast demonstrating mixed kinetics and measuring 0.7 x 0.5 x 0.6 cm. No  definite additional suspicious areas of enhancement or enhancing masses are seen in the left breast.  Lymph nodes: No morphologically abnormal appearing lymph nodes. No internal mammary lymphadenopathy seen.  Ancillary findings:  None.  IMPRESSION: 1. Biopsy proven malignancy in the superior right breast at 12 o'clock measuring up to 1.3 cm with 2 small probable satellite nodules located approximately 1.5 medially to the dominant mass. Surrounding non mass enhancement likely represents biopsy related changes.  2. Indeterminate 0.6 cm linear focus of enhancement 2.6 cm anterior inferior to the dominant mass.  3. Suspicious 0.7 cm enhancing mass in the lower slightly inner left breast.  RECOMMENDATION: 1. Second-look ultrasound is recommended for the 0.7 cm enhancing mass in the lower slightly inner left breast. If this cannot be seen sonographically, then MRI guided biopsy is recommended.  2. MRI guided biopsy of the indeterminate 6 mm linear focus of enhancement 2.6 cm anterior inferior to the dominant mass.  BI-RADS CATEGORY  4: Suspicious abnormality - biopsy should be considered.   Electronically Signed   By: Everlean Alstrom M.D.   On: 06/03/2013 16:42     LABS:    Chemistry      Component Value Date/Time   NA 141 06/08/2013 0820   NA 141 10/12/2012 1338   K 3.5 06/08/2013 0820   K 3.6 10/12/2012 1338   CL 102 10/12/2012 1338   CO2 24 06/08/2013 0820   CO2 31 10/12/2012 1338   BUN 19.6 06/08/2013 0820   BUN 20 10/12/2012 1338   CREATININE 0.8 06/08/2013 0820   CREATININE 1.1 10/12/2012 1338      Component Value Date/Time   CALCIUM 9.4 06/08/2013 0820   CALCIUM 9.2 10/12/2012 1338   ALKPHOS 61 06/08/2013 0820   ALKPHOS 51 02/09/2012 1621   AST 13 06/08/2013 0820   AST 18 02/09/2012 1621   ALT 9 06/08/2013 0820   ALT 11 02/09/2012 1621   BILITOT 0.58 06/08/2013 0820   BILITOT 0.9 02/09/2012 1621      Lab Results  Component Value Date   WBC 8.7 06/08/2013   HGB 13.9 06/08/2013   HCT 40.4 06/08/2013    MCV 84.2 06/08/2013   PLT 226 06/08/2013     ASSESSMENT  80 year old female with new diagnosis of right and left breast cancer. The right breast mass at the 12:00 position measuring 1.5 cm enhancing on MRI to 1.3 with 2 small satellite lesions. Left side on MRI revealed a 7 mm mass which is consistent with a invasive cancer. The pathology on the right revealed an invasive ductal carcinoma, grade 2, ER positive PR positive HER-2/neu negative with a proliferation marker Ki-67  of 22%. The left biopsy that was performed on January 13 revealed invasive cancer at this time Karnofsky markers are not available.  Patient and the diabetes last part of the visit the pathophysiology of breast cancer and her treatment options which would include lumpectomy versus a mastectomy. Certainly we do need to biopsy the linear enhancement seen on the MRI in the right breast. We also discussed adjuvant treatment options including chemotherapy versus antiestrogen therapy. At this time I do not think that she needs chemotherapy however we will wait for the definitive pathology before determining that. We certainly could send off an Oncotype DX on her but I would like to see the final pathology. We discussed role of radiation therapy if patient proceeds with a lumpectomy bilaterally. She was seen by Dr. Thea Silversmith.  We also discussed genetic counseling and testing due to the fact that patient has bilateral breast cancers. I explained to her the significance 4 the BRCA1 and BRCA2 gene mutations for her as well as her daughter's. She is gong to be tested for this at some point.   PLAN:    #1 proceed with biopsy of the linear enhancement in the right breast.  #2 after that her definitive surgery will be determined at lumpectomy versus a mastectomy on the right side and a lumpectomy on the left side.  #3 on the final pathology we will consider sending an Oncotype DX testing to determine whether or not she needs  chemotherapy.  #4 because patient's tumor is estrogen receptor positive she would be a good candidate for antiestrogen therapy. I discussed tamoxifen versus aromatase inhibitors. We discussed the rationale. We also discussed some of the potential side effects.  #5 I will plan on seeing the patient back after her surgery.       Discussion: Patient is being treated per NCCN breast cancer care guidelines appropriate for stage.I   Thank you so much for allowing me to participate in the care of Cynthia Garza. I will continue to follow up the patient with you and assist in her care.  All questions were answered. The patient knows to call the clinic with any problems, questions or concerns. We can certainly see the patient much sooner if necessary.  I spent 40 minutes counseling the patient face to face. The total time spent in the appointment was 60 minutes.  Marcy Panning, MD Medical/Oncology [facility] [phone] (beeper) [phone] (Office)  06/08/2013, 12:51 PM

## 2013-06-08 NOTE — Progress Notes (Signed)
Radiation Oncology         [phone]) [phone] ________________________________  Initial outpatient Consultation - Date: 06/08/2013   Name: Cynthia Garza MRN: [id number]   DOB: Feb 01, 1933  REFERRING PHYSICIAN: Merrie Roof, MD  DIAGNOSIS: T1N0 Left breast cancer. T1N0 right breast cancer  HISTORY OF PRESENT ILLNESS::Cynthia Garza is a 80 y.o. female  is coming in for calcifications of the left breast. These calcifications were stable but a right breast mass was found. This was 1.5 cm on ultrasound. Biopsy showed a grade 2 invasive ductal carcinoma which is ER positive PR positive HER-2 negative. Ki-67 was 22%. MRI of the bilateral breasts was performed which showed a 0.8 x 1.2 x 1.3 cm mass in the right breast with 2 small satellite lesions immediately adjacent to this mass. 2.6 cm anterior to this a focus of indeterminate linear enhancement was noted and an MRI biopsy was recommended. In the left breast a 7 mm mass was also noted on the MRI scan and an ultrasound-guided biopsy was performed which showed an invasive ductal carcinoma. Receptors are pending. MRI guided biopsy of the area of enhancement in the right breast is scheduled. She is interested in breast conservation. She is accompanied by her husband and daughters today. He has no personal history of breast cancer. She had her last period in 1977. She is GX P2 with her first live birth at 24. She is healing well from her surgery.Marland Kitchen  PREVIOUS RADIATION THERAPY: No  PAST MEDICAL HISTORY:  has a past medical history of Hemangioma of intra-abdominal structures; Renal cyst; Shingles; Paresthesia; Heart murmur; Dyslipidemia; GERD (gastroesophageal reflux disease); Osteoporosis; Insomnia; Depression; HTN (hypertension); LBP (low back pain); and Anxiety.    PAST SURGICAL HISTORY: Past Surgical History  Procedure Laterality Date  . Abdominal hysterectomy      FAMILY HISTORY:  Family History  Problem Relation Age of Onset  . Kidney  failure Mother   . Cancer Father   . Hypertension Father   . Coronary artery disease Neg Hx     SOCIAL HISTORY:  History  Substance Use Topics  . Smoking status: Never Smoker   . Smokeless tobacco: Not on file  . Alcohol Use: No    ALLERGIES: Simvastatin and Diflucan  MEDICATIONS:  Current Outpatient Prescriptions  Medication Sig Dispense Refill  . aspirin 81 MG tablet Take 81 mg by mouth daily.      Marland Kitchen buPROPion (WELLBUTRIN XL) 150 MG 24 hr tablet TAKE 1 TABLET (150 MG TOTAL) BY MOUTH DAILY.  90 tablet  3  . Carboxymethylcellul-Glycerin (OPTIVE) 0.5-0.9 % SOLN Place 1-2 drops into both eyes at bedtime. For dry eyes      . cholecalciferol (VITAMIN D) 1000 UNITS tablet Take 1 tablet (1,000 Units total) by mouth daily.  100 tablet  3  . losartan-hydrochlorothiazide (HYZAAR) 100-25 MG per tablet TAKE 1 TABLET BY MOUTH DAILY.  90 tablet  1  . meclizine (ANTIVERT) 12.5 MG tablet Take 1 tablet (12.5 mg total) by mouth 3 (three) times daily as needed.  60 tablet  1  . predniSONE (DELTASONE) 10 MG tablet Take 3 tablets on days 1-2, take 2 tablets on days 3-4, take 1 tablet on days 5-6  12 tablet  0  . ranitidine (ZANTAC) 150 MG tablet Take 150 mg by mouth 2 (two) times daily.      . vitamin B-12 (CYANOCOBALAMIN) 1000 MCG tablet Take 1 tablet (1,000 mcg total) by mouth once a week.  100 tablet  3  . zolpidem (AMBIEN) 10 MG tablet Take 1 tablet (10 mg total) by mouth at bedtime as needed. For sleep  90 tablet  0   Current Facility-Administered Medications  Medication Dose Route Frequency Provider Last Rate Last Dose  . methylPREDNISolone acetate (DEPO-MEDROL) injection 80 mg  80 mg Intra-articular Once Cassandria Anger, MD        REVIEW OF SYSTEMS:  A 15 point review of systems is documented in the electronic medical record. This was obtained by the nursing staff. However, I reviewed this with the patient to discuss relevant findings and make appropriate changes.  Pertinent items are noted  in HPI.  PHYSICAL EXAM: There were no vitals filed for this visit.. . She has biopsy change in the upper outer quadrant of the right breast in the lower outer quadrant of the left breast. She has no palpable axillary cervical or supraclavicular adenopathy. She has no palpable abnormalities of the bilateral breasts. Her left breast is tender.  LABORATORY DATA:  Lab Results  Component Value Date   WBC 8.7 06/08/2013   HGB 13.9 06/08/2013   HCT 40.4 06/08/2013   MCV 84.2 06/08/2013   PLT 226 06/08/2013   Lab Results  Component Value Date   NA 141 06/08/2013   K 3.5 06/08/2013   CL 102 10/12/2012   CO2 24 06/08/2013   Lab Results  Component Value Date   ALT 9 06/08/2013   AST 13 06/08/2013   ALKPHOS 61 06/08/2013   BILITOT 0.58 06/08/2013     RADIOGRAPHY: Mr Breast Bilateral W Wo Contrast  06/03/2013   CLINICAL DATA:  80 year old female with recently diagnosed invasive ductal carcinoma of the right breast, a 1.5 cm lobulated mass was found in the right breast at 12 o'clock posterior depth.  EXAM: BILATERAL BREAST MRI WITH AND WITHOUT CONTRAST  LABS:  BUN and creatinine were obtained on site at [place] at  [address]  Results:  BUN 16 mg/dL,  Creatinine 1 mg/dL.  TECHNIQUE: Multiplanar, multisequence MR images of both breasts were obtained prior to and following the intravenous administration of 79m of MultiHance.  THREE-DIMENSIONAL MR IMAGE RENDERING ON INDEPENDENT WORKSTATION:  Three-dimensional MR images were rendered by post-processing of the original MR data on an independent workstation. The three-dimensional MR images were interpreted, and findings are reported in the following complete MRI report for this study. Three dimensional images were evaluated at the independent DynaCad workstation  COMPARISON:  Previous exams  FINDINGS: Breast composition: c:  Heterogeneous fibroglandular tissue  Background parenchymal enhancement: Moderate  Right breast: Biopsy clip artifact is  present in the superior right breast at 12 o'clock at site of known biopsy proven malignancy with the dominant mass measuring 0.8 x 1.2 x 1.3 cm. There is surrounding non mass enhancement, some of which may represent biopsy related changes. Two small probable satellite nodules together measuring up to 0.6 cm are present approximately 1.5 cm medial to the dominant mass. There is an indeterminate 6 mm linear focus of enhancement located approximately 2.6 cm anterior inferior to the dominant mass.  Left breast: There is a mildly irregular enhancing mass within the lower slightly inner left breast demonstrating mixed kinetics and measuring 0.7 x 0.5 x 0.6 cm. No definite additional suspicious areas of enhancement or enhancing masses are seen in the left breast.  Lymph nodes: No morphologically abnormal appearing lymph nodes. No internal mammary lymphadenopathy seen.  Ancillary findings:  None.  IMPRESSION: 1. Biopsy proven malignancy in  the superior right breast at 12 o'clock measuring up to 1.3 cm with 2 small probable satellite nodules located approximately 1.5 medially to the dominant mass. Surrounding non mass enhancement likely represents biopsy related changes.  2. Indeterminate 0.6 cm linear focus of enhancement 2.6 cm anterior inferior to the dominant mass.  3. Suspicious 0.7 cm enhancing mass in the lower slightly inner left breast.  RECOMMENDATION: 1. Second-look ultrasound is recommended for the 0.7 cm enhancing mass in the lower slightly inner left breast. If this cannot be seen sonographically, then MRI guided biopsy is recommended.  2. MRI guided biopsy of the indeterminate 6 mm linear focus of enhancement 2.6 cm anterior inferior to the dominant mass.  BI-RADS CATEGORY  4: Suspicious abnormality - biopsy should be considered.   Electronically Signed   By: Everlean Alstrom M.D.   On: 06/03/2013 16:42      IMPRESSION: Bilateral T1 breast cancers with a possible area of non-masslike enhancement in the  right breast biopsy pending  PLAN: I spoke to Ms. Gilreath and her family regarding her diagnosis. We discussed the role of radiation in decreasing local failures in patients who undergo lumpectomy. She may need a mastectomy on the right if this area doesn't turn out to be cancer. She would qualify for a lumpectomy on the left. We discussed the results of randomized trials looking at omitting radiation in elderly women who undergo lumpectomy. We discussed that she could undergo antiestrogen therapy alone. We discussed the process of simulation the placement tattoos. We discussed skin redness and fatigue as possible side effects. We discussed 4-6 weeks of treatment as an outpatient. She still has surgical decisions and possible Oncotype with chemotherapy decisions pending. For this reason I left things very broad with her. She ultimately may elect for bilateral mastectomies. She asked about genetic counseling for her daughter and granddaughter. She has no sisters and her mother and grandmother did not have breast cancer. She does have bilateral cancers and I encouraged her to discuss this with Dr. Humphrey Rolls. My suspicion of a genetic) did disease is very low. At this point I will plan on seeing her back after her surgeries. She met with surgery, medical oncology, physical therapy, and a member of our patient family support staff.  I spent 40 minutes  face to face with the patient and more than 50% of that time was spent in counseling and/or coordination of care.   ------------------------------------------------  Thea Silversmith, MD

## 2013-06-08 NOTE — Progress Notes (Signed)
Checked in new patient with no financial issues. She has her appt card and [facility] forms. She has not been to [place].

## 2013-06-10 ENCOUNTER — Telehealth: Payer: Self-pay | Admitting: Oncology

## 2013-06-13 ENCOUNTER — Encounter: Payer: Self-pay | Admitting: *Deleted

## 2013-06-13 NOTE — Progress Notes (Signed)
[place] Psychosocial Distress Screening Clinical Social Work  Patient completed distress screening protocol, and scored a 5 on the Psychosocial Distress Thermometer which indicates moderate distress. Clinical Social Worker met with pt and pt's family in [facility] on 06/08/13 to assess for distress and other psychosocial needs.  Pt expressed feeling "overwhelemd", but stated she felt "better" after speaking with the treatment team and getting more information on her treatment plan.  CSW validated pt's feelings and concerns, and informed pt of the support team and support services at [facility].  CSW encouraged pt to call with any questions or concerns.     Johnnye Lana, MSW, [place] Worker [facility] [phone]

## 2013-06-14 ENCOUNTER — Ambulatory Visit
Admission: RE | Admit: 2013-06-14 | Discharge: 2013-06-14 | Disposition: A | Discharge status: Home / Self Care | Payer: Medicare Other | Source: Ambulatory Visit | Attending: Radiology | Admitting: Radiology

## 2013-06-14 ENCOUNTER — Other Ambulatory Visit: Payer: Self-pay | Admitting: Radiology

## 2013-06-14 DIAGNOSIS — N6489 Other specified disorders of breast: Secondary | ICD-10-CM | POA: Diagnosis not present

## 2013-06-14 DIAGNOSIS — C50919 Malignant neoplasm of unspecified site of unspecified female breast: Secondary | ICD-10-CM | POA: Diagnosis not present

## 2013-06-14 DIAGNOSIS — R928 Other abnormal and inconclusive findings on diagnostic imaging of breast: Secondary | ICD-10-CM

## 2013-06-14 DIAGNOSIS — N6089 Other benign mammary dysplasias of unspecified breast: Secondary | ICD-10-CM | POA: Diagnosis not present

## 2013-06-14 DIAGNOSIS — N6019 Diffuse cystic mastopathy of unspecified breast: Secondary | ICD-10-CM | POA: Diagnosis not present

## 2013-06-14 MED ORDER — GADOBENATE DIMEGLUMINE 529 MG/ML IV SOLN
12.0000 mL | Freq: Once | INTRAVENOUS | Status: AC | PRN
  Administered 2013-06-14: 12 mL via INTRAVENOUS

## 2013-06-16 ENCOUNTER — Telehealth (INDEPENDENT_AMBULATORY_CARE_PROVIDER_SITE_OTHER): Payer: Self-pay | Admitting: General Surgery

## 2013-06-16 NOTE — Telephone Encounter (Signed)
Pt called to report that Dr. Owens Shark had called her and gone over the results of the MRI needle-guided biopsy.  She is calling today to ask what Dr. Marlou Starks has planned about the known breast cancer.  She has no pending appt with him, so will forward the question to him to call her.

## 2013-06-17 ENCOUNTER — Telehealth: Payer: Self-pay | Admitting: *Deleted

## 2013-06-17 NOTE — Telephone Encounter (Signed)
Called and spoke with patient from [facility] 06/08/13.  Patient is very anxious to get started with surgery.  Gave support and informed her I would send a message to Dr. Marlou Starks.

## 2013-06-20 ENCOUNTER — Other Ambulatory Visit (INDEPENDENT_AMBULATORY_CARE_PROVIDER_SITE_OTHER): Payer: Self-pay | Admitting: General Surgery

## 2013-06-20 ENCOUNTER — Telehealth: Payer: Self-pay | Admitting: *Deleted

## 2013-06-20 DIAGNOSIS — C50911 Malignant neoplasm of unspecified site of right female breast: Secondary | ICD-10-CM

## 2013-06-20 DIAGNOSIS — C50912 Malignant neoplasm of unspecified site of left female breast: Principal | ICD-10-CM

## 2013-06-20 NOTE — Telephone Encounter (Signed)
Faxed Care Plan & office notes to [place] at [place] and to the PCP.  Took Care Plan to Med Rec to scan.

## 2013-06-20 NOTE — Telephone Encounter (Signed)
Pt called to ask when surgery will be scheduled?  Informed pt that I have called [facility] and left a message for surgery scheduler to see if any orders have been received as well as left a message for Dr. Marlou Starks.  Pt relate she is very anxious to get the process started.  Gave emotion support.  Informed pt that I will continue to monitor the progress of her surgery being scheduled.  Pt denies further needs.  Contact information given.

## 2013-06-21 ENCOUNTER — Encounter (INDEPENDENT_AMBULATORY_CARE_PROVIDER_SITE_OTHER): Payer: Self-pay | Admitting: General Surgery

## 2013-06-21 ENCOUNTER — Ambulatory Visit (INDEPENDENT_AMBULATORY_CARE_PROVIDER_SITE_OTHER): Payer: Medicare Other | Admitting: General Surgery

## 2013-06-21 VITALS — BP 150/88 | HR 68 | Temp 98.4°F | Resp 14 | Ht 61.0 in | Wt 138.0 lb

## 2013-06-21 DIAGNOSIS — C50419 Malignant neoplasm of upper-outer quadrant of unspecified female breast: Secondary | ICD-10-CM | POA: Diagnosis not present

## 2013-06-21 DIAGNOSIS — C50411 Malignant neoplasm of upper-outer quadrant of right female breast: Secondary | ICD-10-CM

## 2013-06-21 NOTE — Patient Instructions (Signed)
Plan for bilateral wire localized lumpectomy and sentinel node biopsy

## 2013-06-21 NOTE — Progress Notes (Signed)
Subjective:     Patient ID: Cynthia Garza, female   DOB: Apr 23, 1933, 80 y.o.   MRN: [id number]  HPI The patient is a 80 year old white female who is recently diagnosed with bilateral breast cancer. Both areas appear to be stage I disease. She comes in today with questions about her upcoming surgery. We spent quite a bit of time going over her questions and the details of surgery. She is satisfied and ready to schedule.  Review of Systems  Constitutional: Negative.   HENT: Negative.   Eyes: Negative.   Respiratory: Negative.   Cardiovascular: Negative.   Gastrointestinal: Negative.   Endocrine: Negative.   Genitourinary: Negative.   Musculoskeletal: Negative.   Skin: Negative.   Allergic/Immunologic: Negative.   Neurological: Negative.   Hematological: Negative.   Psychiatric/Behavioral: Negative.        Objective:   Physical Exam  Constitutional: She is oriented to person, place, and time. She appears well-developed and well-nourished.  HENT:  Head: Normocephalic and atraumatic.  Eyes: Conjunctivae and EOM are normal. Pupils are equal, round, and reactive to light.  Neck: Normal range of motion. Neck supple.  Cardiovascular: Normal rate, regular rhythm and normal heart sounds.   Pulmonary/Chest: Effort normal and breath sounds normal.  There is significant bruising in the right breast. Otherwise there is no palpable mass in either breast. There is no palpable axillary, supraclavicular, or cervical lymphadenopathy  Abdominal: Soft. Bowel sounds are normal.  Musculoskeletal: Normal range of motion.  Lymphadenopathy:    She has no cervical adenopathy.  Neurological: She is alert and oriented to person, place, and time.  Skin: Skin is warm and dry.  Psychiatric: She has a normal mood and affect. Her behavior is normal.       Assessment:     The patient has bilateral breast cancer. The options for surgical treatment were discussed in detail and she favors breast  conservation. I think this is a reasonable choice.     Plan:     We will plan for bilateral wire localized lumpectomies and sentinel node mapping

## 2013-06-23 ENCOUNTER — Encounter (HOSPITAL_COMMUNITY): Payer: Self-pay | Admitting: Pharmacy Technician

## 2013-06-27 ENCOUNTER — Telehealth: Payer: Self-pay | Admitting: *Deleted

## 2013-06-27 ENCOUNTER — Other Ambulatory Visit: Payer: Medicare Other

## 2013-06-27 ENCOUNTER — Encounter: Payer: Self-pay | Admitting: Genetic Counselor

## 2013-06-27 ENCOUNTER — Ambulatory Visit (HOSPITAL_BASED_OUTPATIENT_CLINIC_OR_DEPARTMENT_OTHER): Payer: Medicare Other | Admitting: Genetic Counselor

## 2013-06-27 DIAGNOSIS — IMO0002 Reserved for concepts with insufficient information to code with codable children: Secondary | ICD-10-CM | POA: Diagnosis not present

## 2013-06-27 DIAGNOSIS — C50419 Malignant neoplasm of upper-outer quadrant of unspecified female breast: Secondary | ICD-10-CM

## 2013-06-27 DIAGNOSIS — C50919 Malignant neoplasm of unspecified site of unspecified female breast: Secondary | ICD-10-CM | POA: Insufficient documentation

## 2013-06-27 DIAGNOSIS — C50319 Malignant neoplasm of lower-inner quadrant of unspecified female breast: Secondary | ICD-10-CM

## 2013-06-27 DIAGNOSIS — C50411 Malignant neoplasm of upper-outer quadrant of right female breast: Secondary | ICD-10-CM

## 2013-06-27 NOTE — Progress Notes (Signed)
Dr.  Marcy Panning requested a consultation for genetic counseling and risk assessment for Cynthia Garza, a 80 y.o. female, for discussion of her personal history of breast cancer.  She presents to clinic today to discuss the possibility of a genetic predisposition to cancer, and to further clarify her risks, as well as her family members' risks for cancer.   HISTORY OF PRESENT ILLNESS: In 2015, at the age of 9, Cynthia Garza was diagnosed with invasive ductal carcinoma of the right breast. This will be treated with lumpectomy and radiation. She has had a colonoscopy in the past that was negative.   Past Medical History  Diagnosis Date  . Hemangioma of intra-abdominal structures   . Renal cyst   . Shingles   . Paresthesia   . Heart murmur   . Dyslipidemia   . GERD (gastroesophageal reflux disease)   . Osteoporosis   . Insomnia   . Depression   . HTN (hypertension)   . LBP (low back pain)   . Anxiety   . Breast cancer 2015    ER+/PR+/Her2-    Past Surgical History  Procedure Laterality Date  . Abdominal hysterectomy      History   Social History  . Marital Status: Married    Spouse Name: N/A    Number of Children: 2  . Years of Education: N/A   Occupational History  . Retired    Social History Main Topics  . Smoking status: Never Smoker   . Smokeless tobacco: Never Used  . Alcohol Use: No  . Drug Use: No  . Sexual Activity: None   Other Topics Concern  . None   Social History Narrative  . None    REPRODUCTIVE HISTORY AND PERSONAL RISK ASSESSMENT FACTORS: Menarche was at age 47.   postmenopausal Uterus Intact: no, removed in 1977 because of multiple abnormal pap smears Ovaries Intact: unsure if removed in 1977 or not G2P2A0, first live birth at age 7  She has not previously undergone treatment for infertility.   Oral Contraceptive use: 0 years   She has not used HRT in the past.    FAMILY HISTORY:  We obtained a detailed, 4-generation  family history.  Significant diagnoses are listed below: Family History  Problem Relation Age of Onset  . Kidney failure Mother   . Hypertension Father   . Esophageal cancer Father   . Coronary artery disease Neg Hx   The patient's father was an only child.  Her mother had four brothers, and one sister who died during childbirth.  No other family members were reported to have related cancers.  Patient's maternal ancestors are of caucaisan descent, and paternal ancestors are of caucasian descent. There is no reported Ashkenazi Jewish ancestry. There is no known consanguinity.  GENETIC COUNSELING ASSESSMENT: [name] is a 80 y.o. female with a personal history of breast cancer which somewhat suggestive of a sporadic form of breast cancer. We, therefore, discussed and recommended the following at today's visit.   DISCUSSION: We reviewed the characteristics, features and inheritance patterns of hereditary cancer syndromes. We also discussed genetic testing, including the appropriate family members to test, the process of testing, insurance coverage and turn-around-time for results. We discussed that 5-10% of breast cancer is hereditary, and that about 70% is sporadic.  Women are at an increased risk for developing breast cancer as we get older.  Based on her age and lack of family history of breast or ovarian cancer, the  most likely scenario is that her cancer is sporadic.  Therefore she does not meet the medical criteria for genetic testing based on Medicare guidelines.    PLAN: After considering the risks, benefits, and limitations, [name] declined genetic testing based on not meeting Medicare guidelines. We encouraged [name] to remain in contact with cancer genetics annually so that we can continuously update the family history and inform her of any changes in cancer genetics and testing that may be of benefit for her family. Bonnita Levan Cajamarca's questions were  answered to her satisfaction today. Our contact information was provided should additional questions or concerns arise.  The patient was seen for a total of 45 minutes, greater than 50% of which was spent face-to-face counseling.  This note will also be sent to the referring provider via the electronic medical record. The patient will be supplied with a summary of this genetic counseling discussion as well as educational information on the discussed hereditary cancer syndromes following the conclusion of their visit.   Patient was discussed with Dr. Marcy Panning.   _______________________________________________________________________ For Office Staff:  Number of people involved in session: 2 Was an Intern/ student involved with case: yes

## 2013-06-27 NOTE — Telephone Encounter (Signed)
Called and spoke with patient and rescheduled her appointment due to her surgery is scheduled for 07/07/13 and will be awaiting oncotype results.  Confirmed new appointment for 08/09/13 at [facility]N with Dr. Humphrey Rolls. No other questions or concerns at this time.

## 2013-06-30 ENCOUNTER — Encounter: Payer: Self-pay | Admitting: Internal Medicine

## 2013-06-30 ENCOUNTER — Encounter (HOSPITAL_COMMUNITY)
Admission: RE | Admit: 2013-06-30 | Discharge: 2013-06-30 | Disposition: A | Discharge status: Home / Self Care | Payer: Medicare Other | Source: Ambulatory Visit | Attending: General Surgery | Admitting: General Surgery

## 2013-06-30 ENCOUNTER — Encounter (HOSPITAL_COMMUNITY): Payer: Self-pay

## 2013-06-30 ENCOUNTER — Ambulatory Visit (HOSPITAL_COMMUNITY)
Admission: RE | Admit: 2013-06-30 | Discharge: 2013-06-30 | Disposition: A | Discharge status: Home / Self Care | Payer: Medicare Other | Source: Ambulatory Visit | Attending: Anesthesiology | Admitting: Anesthesiology

## 2013-06-30 DIAGNOSIS — Z01810 Encounter for preprocedural cardiovascular examination: Secondary | ICD-10-CM | POA: Insufficient documentation

## 2013-06-30 DIAGNOSIS — R9431 Abnormal electrocardiogram [ECG] [EKG]: Secondary | ICD-10-CM | POA: Diagnosis not present

## 2013-06-30 DIAGNOSIS — Z01812 Encounter for preprocedural laboratory examination: Secondary | ICD-10-CM | POA: Diagnosis not present

## 2013-06-30 DIAGNOSIS — Z01818 Encounter for other preprocedural examination: Secondary | ICD-10-CM | POA: Diagnosis not present

## 2013-06-30 DIAGNOSIS — C50919 Malignant neoplasm of unspecified site of unspecified female breast: Secondary | ICD-10-CM | POA: Insufficient documentation

## 2013-06-30 DIAGNOSIS — I10 Essential (primary) hypertension: Secondary | ICD-10-CM | POA: Diagnosis not present

## 2013-06-30 LAB — CBC
HCT: 40.7 % (ref 36.0–46.0)
HEMOGLOBIN: 14.3 g/dL (ref 12.0–15.0)
MCH: 29.5 pg (ref 26.0–34.0)
MCHC: 35.1 g/dL (ref 30.0–36.0)
MCV: 83.9 fL (ref 78.0–100.0)
PLATELETS: 222 10*3/uL (ref 150–400)
RBC: 4.85 MIL/uL (ref 3.87–5.11)
RDW: 13 % (ref 11.5–15.5)
WBC: 9.9 10*3/uL (ref 4.0–10.5)

## 2013-06-30 LAB — BASIC METABOLIC PANEL
BUN: 18 mg/dL (ref 6–23)
CALCIUM: 9.3 mg/dL (ref 8.4–10.5)
CO2: 27 mEq/L (ref 19–32)
Chloride: 103 mEq/L (ref 96–112)
Creatinine, Ser: 0.87 mg/dL (ref 0.50–1.10)
GFR calc non Af Amer: 62 mL/min — ABNORMAL LOW (ref >90)
GFR, EST AFRICAN AMERICAN: 72 mL/min — AB (ref >90)
GLUCOSE: 94 mg/dL (ref 70–99)
POTASSIUM: 4 meq/L (ref 3.7–5.3)
SODIUM: 143 meq/L (ref 137–147)

## 2013-06-30 NOTE — Pre-Procedure Instructions (Addendum)
Cynthia Garza  06/30/2013   Your procedure is scheduled on:  Thursday, July 07, 2013  Report to [facility] Entrance "A" [place] at 8:30 AM.  Call this number if you have problems the morning of surgery: [phone]   Remember:   Do not eat food or drink liquids after midnight.   Take these medicines the morning of surgery with A SIP OF WATER: bupropion (Wellbutrin), meclizine (Antivert)--if needed, ranitidine (Zantac)  STOP taking Aspirin, Goody's, BC's, Aleve (Naproxen), Ibuprofen (Advil or Motrin), Fish Oil, Vitamins, Herbal Supplements or any substance that could thin your blood starting today, 06/30/13.   Do not wear jewelry, make-up or nail polish.  Do not wear lotions, powders, or perfumes.  Do not shave 48 hours prior to surgery.   Do not bring valuables to the hospital.  [facility] is not responsible                  for any belongings or valuables.               Contacts, dentures or bridgework may not be worn into surgery.  Leave suitcase in the car. After surgery it may be brought to your room.  For patients admitted to the hospital, discharge time is determined by your                treatment team.               Patients discharged the day of surgery will not be allowed to drive  home.     Special Instructions: Please use CHG soap the night before surgery and the day of surgery. CHG soap should be used atleast twice.   Please read over the following fact sheets that you were given: Pain Booklet, Coughing and Deep Breathing and Surgical Site Infection Prevention

## 2013-07-06 MED ORDER — CEFAZOLIN SODIUM-DEXTROSE 2-3 GM-% IV SOLR
2.0000 g | INTRAVENOUS | Status: AC
  Administered 2013-07-07: 2 g via INTRAVENOUS

## 2013-07-06 MED ORDER — CHLORHEXIDINE GLUCONATE 4 % EX LIQD
1.0000 "application " | Freq: Once | CUTANEOUS | Status: DC
  Filled 2013-07-06: qty 15

## 2013-07-06 MED ORDER — LACTATED RINGERS IV SOLN: INTRAVENOUS | Status: DC

## 2013-07-06 NOTE — Progress Notes (Signed)
Pt notified of arrival time of 0900. Has to go to breast center 1st then will come straight on

## 2013-07-07 ENCOUNTER — Encounter (HOSPITAL_COMMUNITY)
Admission: RE | Disposition: A | Discharge status: Home / Self Care | Payer: Self-pay | Source: Ambulatory Visit | Attending: General Surgery

## 2013-07-07 ENCOUNTER — Ambulatory Visit (HOSPITAL_COMMUNITY): Payer: Medicare Other | Admitting: Anesthesiology

## 2013-07-07 ENCOUNTER — Encounter (HOSPITAL_COMMUNITY)
Admission: RE | Admit: 2013-07-07 | Discharge: 2013-07-07 | Disposition: A | Discharge status: Home / Self Care | Payer: Medicare Other | Source: Ambulatory Visit | Attending: General Surgery | Admitting: General Surgery

## 2013-07-07 ENCOUNTER — Ambulatory Visit (HOSPITAL_COMMUNITY)
Admission: RE | Admit: 2013-07-07 | Discharge: 2013-07-07 | Disposition: A | Discharge status: Home / Self Care | Payer: Medicare Other | Source: Ambulatory Visit | Attending: General Surgery | Admitting: General Surgery

## 2013-07-07 ENCOUNTER — Encounter (HOSPITAL_COMMUNITY): Payer: Self-pay | Admitting: *Deleted

## 2013-07-07 DIAGNOSIS — N63 Unspecified lump in unspecified breast: Secondary | ICD-10-CM | POA: Diagnosis not present

## 2013-07-07 DIAGNOSIS — K219 Gastro-esophageal reflux disease without esophagitis: Secondary | ICD-10-CM | POA: Insufficient documentation

## 2013-07-07 DIAGNOSIS — C50919 Malignant neoplasm of unspecified site of unspecified female breast: Secondary | ICD-10-CM | POA: Insufficient documentation

## 2013-07-07 DIAGNOSIS — C50911 Malignant neoplasm of unspecified site of right female breast: Secondary | ICD-10-CM

## 2013-07-07 DIAGNOSIS — F411 Generalized anxiety disorder: Secondary | ICD-10-CM | POA: Diagnosis not present

## 2013-07-07 DIAGNOSIS — I10 Essential (primary) hypertension: Secondary | ICD-10-CM | POA: Diagnosis not present

## 2013-07-07 DIAGNOSIS — D0590 Unspecified type of carcinoma in situ of unspecified breast: Secondary | ICD-10-CM | POA: Insufficient documentation

## 2013-07-07 DIAGNOSIS — F329 Major depressive disorder, single episode, unspecified: Secondary | ICD-10-CM | POA: Diagnosis not present

## 2013-07-07 DIAGNOSIS — M549 Dorsalgia, unspecified: Secondary | ICD-10-CM | POA: Diagnosis not present

## 2013-07-07 DIAGNOSIS — C50912 Malignant neoplasm of unspecified site of left female breast: Principal | ICD-10-CM

## 2013-07-07 DIAGNOSIS — F3289 Other specified depressive episodes: Secondary | ICD-10-CM | POA: Insufficient documentation

## 2013-07-07 DIAGNOSIS — N6489 Other specified disorders of breast: Secondary | ICD-10-CM | POA: Diagnosis not present

## 2013-07-07 HISTORY — PX: BREAST LUMPECTOMY WITH NEEDLE LOCALIZATION AND AXILLARY SENTINEL LYMPH NODE BX: SHX5760

## 2013-07-07 SURGERY — BREAST LUMPECTOMY WITH NEEDLE LOCALIZATION AND AXILLARY SENTINEL LYMPH NODE BX
Anesthesia: General | Site: Breast | Laterality: Bilateral

## 2013-07-07 MED ORDER — FENTANYL CITRATE 0.05 MG/ML IJ SOLN
INTRAMUSCULAR | Status: AC
  Filled 2013-07-07: qty 2

## 2013-07-07 MED ORDER — OXYCODONE HCL 5 MG PO TABS
ORAL_TABLET | ORAL | Status: AC
  Filled 2013-07-07: qty 1

## 2013-07-07 MED ORDER — FENTANYL CITRATE 0.05 MG/ML IJ SOLN
INTRAMUSCULAR | Status: DC | PRN
  Administered 2013-07-07: 50 ug via INTRAVENOUS
  Administered 2013-07-07 (×2): 25 ug via INTRAVENOUS
  Administered 2013-07-07: 50 ug via INTRAVENOUS
  Administered 2013-07-07 (×2): 25 ug via INTRAVENOUS

## 2013-07-07 MED ORDER — BUPIVACAINE-EPINEPHRINE 0.25% -1:200000 IJ SOLN
INTRAMUSCULAR | Status: DC | PRN
  Administered 2013-07-07: 30 mL

## 2013-07-07 MED ORDER — LACTATED RINGERS IV SOLN
INTRAVENOUS | Status: DC
  Administered 2013-07-07: 10:00:00 via INTRAVENOUS

## 2013-07-07 MED ORDER — ONDANSETRON HCL 4 MG/2ML IJ SOLN
INTRAMUSCULAR | Status: AC
  Filled 2013-07-07: qty 2

## 2013-07-07 MED ORDER — LIDOCAINE HCL (CARDIAC) 20 MG/ML IV SOLN
INTRAVENOUS | Status: AC
  Filled 2013-07-07: qty 5

## 2013-07-07 MED ORDER — MIDAZOLAM HCL 2 MG/2ML IJ SOLN
INTRAMUSCULAR | Status: AC
  Filled 2013-07-07: qty 2

## 2013-07-07 MED ORDER — FENTANYL CITRATE 0.05 MG/ML IJ SOLN
25.0000 ug | INTRAMUSCULAR | Status: DC | PRN
  Administered 2013-07-07 (×3): 25 ug via INTRAVENOUS
  Administered 2013-07-07: 50 ug via INTRAVENOUS
  Administered 2013-07-07: 25 ug via INTRAVENOUS

## 2013-07-07 MED ORDER — ONDANSETRON HCL 4 MG/2ML IJ SOLN
INTRAMUSCULAR | Status: DC | PRN
  Administered 2013-07-07: 4 mg via INTRAVENOUS

## 2013-07-07 MED ORDER — LIDOCAINE HCL (CARDIAC) 20 MG/ML IV SOLN
INTRAVENOUS | Status: DC | PRN
  Administered 2013-07-07: 80 mg via INTRAVENOUS

## 2013-07-07 MED ORDER — PROPOFOL 10 MG/ML IV BOLUS
INTRAVENOUS | Status: AC
  Filled 2013-07-07: qty 20

## 2013-07-07 MED ORDER — OXYCODONE-ACETAMINOPHEN 5-325 MG PO TABS: 1.0000 | ORAL_TABLET | ORAL | Status: DC | PRN

## 2013-07-07 MED ORDER — PROPOFOL 10 MG/ML IV BOLUS
INTRAVENOUS | Status: DC | PRN
  Administered 2013-07-07: 170 mg via INTRAVENOUS

## 2013-07-07 MED ORDER — LACTATED RINGERS IV SOLN
INTRAVENOUS | Status: DC | PRN
  Administered 2013-07-07 (×2): via INTRAVENOUS

## 2013-07-07 MED ORDER — FENTANYL CITRATE 0.05 MG/ML IJ SOLN
INTRAMUSCULAR | Status: AC
  Filled 2013-07-07: qty 5

## 2013-07-07 MED ORDER — DEXAMETHASONE SODIUM PHOSPHATE 10 MG/ML IJ SOLN
INTRAMUSCULAR | Status: DC | PRN
  Administered 2013-07-07: 4 mg via INTRAVENOUS

## 2013-07-07 MED ORDER — 0.9 % SODIUM CHLORIDE (POUR BTL) OPTIME
TOPICAL | Status: DC | PRN
  Administered 2013-07-07: 1000 mL

## 2013-07-07 MED ORDER — SODIUM CHLORIDE 0.9 % IJ SOLN
INTRAMUSCULAR | Status: DC | PRN
  Administered 2013-07-07: 10 mL via INTRAVENOUS

## 2013-07-07 MED ORDER — SODIUM CHLORIDE 0.9 % IJ SOLN
INTRAMUSCULAR | Status: DC | PRN
  Administered 2013-07-07: 10:00:00

## 2013-07-07 MED ORDER — TECHNETIUM TC 99M SULFUR COLLOID FILTERED
2.0000 | Freq: Once | INTRAVENOUS | Status: AC | PRN
  Administered 2013-07-07: 2 via INTRADERMAL

## 2013-07-07 MED ORDER — OXYCODONE HCL 5 MG PO TABS
5.0000 mg | ORAL_TABLET | Freq: Once | ORAL | Status: AC
  Administered 2013-07-07: 5 mg via ORAL

## 2013-07-07 MED ORDER — ROCURONIUM BROMIDE 50 MG/5ML IV SOLN
INTRAVENOUS | Status: AC
  Filled 2013-07-07: qty 1

## 2013-07-07 SURGICAL SUPPLY — 57 items
ADH SKN CLS APL DERMABOND .7 (GAUZE/BANDAGES/DRESSINGS) ×1
APPLIER CLIP 9.375 MED OPEN (MISCELLANEOUS) ×6
APR CLP MED 9.3 20 MLT OPN (MISCELLANEOUS) ×2
BINDER BREAST LRG (GAUZE/BANDAGES/DRESSINGS) IMPLANT
BINDER BREAST XLRG (GAUZE/BANDAGES/DRESSINGS) ×2 IMPLANT
BLADE SURG 10 STRL SS (BLADE) ×3 IMPLANT
BLADE SURG 15 STRL LF DISP TIS (BLADE) ×1 IMPLANT
BLADE SURG 15 STRL SS (BLADE) ×3
CANISTER SUCTION 2500CC (MISCELLANEOUS) ×3 IMPLANT
CHLORAPREP W/TINT 26ML (MISCELLANEOUS) ×5 IMPLANT
CLIP APPLIE 9.375 MED OPEN (MISCELLANEOUS) IMPLANT
CONT SPEC 4OZ CLIKSEAL STRL BL (MISCELLANEOUS) ×17 IMPLANT
COVER PROBE W GEL 5X96 (DRAPES) ×5 IMPLANT
COVER SURGICAL LIGHT HANDLE (MISCELLANEOUS) ×3 IMPLANT
DERMABOND ADVANCED (GAUZE/BANDAGES/DRESSINGS) ×2
DERMABOND ADVANCED .7 DNX12 (GAUZE/BANDAGES/DRESSINGS) ×1 IMPLANT
DEVICE DUBIN SPECIMEN MAMMOGRA (MISCELLANEOUS) ×7 IMPLANT
DRAPE CHEST BREAST 15X10 FENES (DRAPES) ×1 IMPLANT
DRAPE ORTHO SPLIT 77X108 STRL (DRAPES) ×9
DRAPE PROXIMA HALF (DRAPES) ×2 IMPLANT
DRAPE SURG ORHT 6 SPLT 77X108 (DRAPES) IMPLANT
DRAPE UTILITY 15X26 W/TAPE STR (DRAPE) ×6 IMPLANT
ELECT COATED BLADE 2.86 ST (ELECTRODE) ×5 IMPLANT
ELECT REM PT RETURN 9FT ADLT (ELECTROSURGICAL) ×3
ELECTRODE REM PT RTRN 9FT ADLT (ELECTROSURGICAL) ×1 IMPLANT
GLOVE BIO SURGEON STRL SZ7.5 (GLOVE) ×17 IMPLANT
GLOVE BIOGEL PI IND STRL 6 (GLOVE) IMPLANT
GLOVE BIOGEL PI IND STRL 7.5 (GLOVE) IMPLANT
GLOVE BIOGEL PI INDICATOR 6 (GLOVE) ×2
GLOVE BIOGEL PI INDICATOR 7.5 (GLOVE) ×6
GLOVE SS BIOGEL STRL SZ 7 (GLOVE) IMPLANT
GLOVE SUPERSENSE BIOGEL SZ 7 (GLOVE) ×6
GOWN STRL NON-REIN LRG LVL3 (GOWN DISPOSABLE) ×16 IMPLANT
KIT BASIN OR (CUSTOM PROCEDURE TRAY) ×3 IMPLANT
KIT MARKER MARGIN INK (KITS) ×6 IMPLANT
KIT ROOM TURNOVER OR (KITS) ×3 IMPLANT
NDL 18GX1X1/2 (RX/OR ONLY) (NEEDLE) ×1 IMPLANT
NDL HYPO 25GX1X1/2 BEV (NEEDLE) ×2 IMPLANT
NEEDLE 18GX1X1/2 (RX/OR ONLY) (NEEDLE) ×3 IMPLANT
NEEDLE HYPO 25GX1X1/2 BEV (NEEDLE) ×6 IMPLANT
NS IRRIG 1000ML POUR BTL (IV SOLUTION) ×3 IMPLANT
PACK SURGICAL SETUP 50X90 (CUSTOM PROCEDURE TRAY) ×3 IMPLANT
PAD ARMBOARD 7.5X6 YLW CONV (MISCELLANEOUS) ×3 IMPLANT
PENCIL BUTTON HOLSTER BLD 10FT (ELECTRODE) ×3 IMPLANT
SPONGE LAP 18X18 X RAY DECT (DISPOSABLE) ×5 IMPLANT
SUT MNCRL AB 4-0 PS2 18 (SUTURE) ×11 IMPLANT
SUT SILK 2 0 SH (SUTURE) IMPLANT
SUT VIC AB 3-0 54X BRD REEL (SUTURE) ×1 IMPLANT
SUT VIC AB 3-0 BRD 54 (SUTURE) ×3
SUT VIC AB 3-0 SH 18 (SUTURE) ×5 IMPLANT
SYR BULB 3OZ (MISCELLANEOUS) ×3 IMPLANT
SYR CONTROL 10ML LL (SYRINGE) ×6 IMPLANT
TOWEL OR 17X24 6PK STRL BLUE (TOWEL DISPOSABLE) ×1 IMPLANT
TOWEL OR 17X26 10 PK STRL BLUE (TOWEL DISPOSABLE) ×3 IMPLANT
TUBE CONNECTING 12'X1/4 (SUCTIONS) ×1
TUBE CONNECTING 12X1/4 (SUCTIONS) ×2 IMPLANT
YANKAUER SUCT BULB TIP NO VENT (SUCTIONS) ×3 IMPLANT

## 2013-07-07 NOTE — Anesthesia Postprocedure Evaluation (Signed)
Anesthesia Post Note  Patient: Cynthia Garza  Procedure(s) Performed: Procedure(s) (LRB): BREAST LUMPECTOMY WITH NEEDLE LOCALIZATION AND AXILLARY SENTINEL LYMPH NODE BIOPIES (Bilateral)  Anesthesia type: General  Patient location: PACU  Post pain: Pain level controlled and Adequate analgesia  Post assessment: Post-op Vital signs reviewed, Patient's Cardiovascular Status Stable, Respiratory Function Stable, Patent Airway and Pain level controlled  Last Vitals:  Filed Vitals:   07/07/13 1515  BP:   Pulse: 79  Temp:   Resp: 14    Post vital signs: Reviewed and stable  Level of consciousness: awake, alert  and oriented  Complications: No apparent anesthesia complications

## 2013-07-07 NOTE — Discharge Instructions (Signed)

## 2013-07-07 NOTE — Interval H&P Note (Signed)
History and Physical Interval Note:  07/07/2013 10:18 AM  Cynthia Garza  has presented today for surgery, with the diagnosis of bilateral breast cancer   The various methods of treatment have been discussed with the patient and family. After consideration of risks, benefits and other options for treatment, the patient has consented to  Procedure(s): BREAST LUMPECTOMY WITH NEEDLE LOCALIZATION AND AXILLARY SENTINEL LYMPH NODE BX (Bilateral) as a surgical intervention .  The patient's history has been reviewed, patient examined, no change in status, stable for surgery.  I have reviewed the patient's chart and labs.  Questions were answered to the patient's satisfaction.     TOTH III,PAUL S

## 2013-07-07 NOTE — Preoperative (Signed)
Beta Blockers   Reason not to administer Beta Blockers:Not Applicable 

## 2013-07-07 NOTE — Op Note (Signed)
07/07/2013  1:17 PM  PATIENT:  Cynthia Garza  80 y.o. female  PRE-OPERATIVE DIAGNOSIS:  BILATERAL BREAST CANCER  POST-OPERATIVE DIAGNOSIS:  BILATERAL BREAST CANCER  PROCEDURE:  Procedure(s): BREAST LUMPECTOMY WITH NEEDLE LOCALIZATION AND AXILLARY SENTINEL LYMPH NODE BIOPIES (Bilateral)  SURGEON:  Surgeon(s) and Role:    * Merrie Roof, MD - Primary  PHYSICIAN ASSISTANT:   ASSISTANTS: Sharyn Dross, RNFA   ANESTHESIA:   general  EBL:  Total I/O In: 1000 [I.V.:1000] Out: -   BLOOD ADMINISTERED:none  DRAINS: none   LOCAL MEDICATIONS USED:  MARCAINE     SPECIMEN:  Source of Specimen:  bilateral breast tissue and sentinel nodes  DISPOSITION OF SPECIMEN:  PATHOLOGY  COUNTS:  YES  TOURNIQUET:  * No tourniquets in log *  DICTATION: .Dragon Dictation After informed consent was obtained patient was brought to the operating room and placed in the supine position on the operating table. After adequate induction of general anesthesia the patient's bilateral chest, breast, and axillary areas were prepped with ChloraPrep, allowed to dry, draped in the usual sterile manner. Earlier in the day the patient underwent injection of 1 mCi of technetium sulfur colloid in the subareolar position on both sides. Also earlier in the day the patient underwent wire localization procedures. One wire was placed on the left side and 2 wires were placed on the right side. At this point, 2 cc of methylene blue 2 cc of injectable saline were injected in the subareolar position on both sides. Attention was first turned to the left breast. The neoprobe was used to identify a hot spot in the left axilla. A small incision was made with a 15 blade knife overlying the hot spot. This incision was carried through the skin and subcutaneous tissue sharply with electrocautery until the axilla was entered. The neoprobe was used direct blunt hemostat dissection until a hot lymph node was identified. This lymph node  was removed by combination of sharp Bovie dissection and lymphatics were controlled with clips. 3 other palpable lymph nodes were identified around this lymph node were also removed at the same time. Ex vivo counts on sentinel node #1 were approximately 1000. These 4 nodes were sent as sentinel node #1 through 4. No other hot, blue, or palpable lymph nodes were identified in the left axilla. The wound was irrigated with copious amounts of saline and infiltrated with quarter percent Marcaine. The deep layer the wound was closed with interrupted 3-0 Vicryl stitches. The skin was then closed with a running 4-0 Monocryl subcuticular stitch. Attention was then turned to the left breast. The wire was entering the left breast in the lower inner quadrant and headed laterally. A radial incision was made overlying the path of the wire. This incision was carried through the skin and subcutaneous tissue sharply with electrocautery. Once in the breast tissues the path of the wire could be palpated. A circular portion of breast tissue was excised sharply with the electrocautery around the path of the wire. This dissection was carried all the way to the chest wall. Once the specimen was removed it was oriented with the appropriate paint colors. A specimen radiograph showed the clip to be in the center of the specimen. Intraoperative margin assessment indicated about a 5-6 mm margin on the deep side as the closest margin. Hemostasis was achieved using the Bovie electrocautery. The cavity of the lumpectomy specimen was marked with clips. The deep layer the wound was then closed with interrupted 3-0 Vicryl  stitches. The skin was then closed with interrupted 4-0 Monocryl subcuticular stitches. Attention was then turned to the right breast. Again the neoprobe was used to identify a hot spot in the right axilla. A small incision was made with a 15 blade knife overlying the hot spot. The incision was carried through the skin and  subcutaneous tissue sharply with electrocautery until the axilla was entered. Using the neoprobe to direct blunt hemostat dissection we were able to identify a hot lymph node. This along with 3 other palpable nodes surrounding it were removed by combination of sharp Bovie dissection and the lymphatics were controlled with clips. Ex vivo counts on sentinel node #1 were approximately 1000. No other hot, blue, or palpable lymph nodes were identified in the right axilla. The wound was irrigated with copious amounts of saline and infiltrated with quarter percent Marcaine. The deep layer was closed with interrupted 3-0 Vicryl stitches. The skin was then closed With a running 4-0 Monocryl subcuticular stitch. Attention was then turned to the right breast. The superior wire were was at the location of the cancer. A transversely oriented incision was made In the 12:00 position overlying the path of the wire. This incision was carried through the skin and subcutaneous tissue sharply with electrocautery. Once into the breast tissue about the wire could be palpated. A circular portion of breast tissue was excised sharply around the path of the wire with the electrocautery. This dissection was carried all the way to the chest wall. Once the specimen was removed it was oriented with the appropriate paint colors. A specimen radiograph showed the clip to be in the center of the specimen. Intraoperative margin assessment indicated the only close margin was approximately 5-6 mm on the deep side. The edges of the lumpectomy cavity were then marked with clips. Hemostasis was achieved using the Bovie electrocautery. The wound was irrigated with copious amounts of saline and infiltrated with quarter percent Marcaine. The deep layer the wound was then closed with interrupted 3-0 Vicryl stitches. Skin was then closed with interrupted 4-0 Monocryl subcuticular stitches. Attention was then turned to the inferior wire. It was entering the  right breast in about the 8:00 position and the head deep and medially. A small radial type incision was made overlying the path of the wire. This incision was carried through the skin and subcutaneous tissue sharply with electrocautery. Once into the breast tissue the path of the wire could be palpated. A circular portion of breast tissue was excised sharply around the path of the wire. Once the specimen was removed it was oriented with the appropriate paint colors. A specimen radiograph showed the clip to be in the specimen. The specimen was then sent to pathology for further evaluation. Hemostasis was achieved using the Bovie electrocautery. The wound was irrigated with copious amounts of saline and infiltrated with quarter percent Marcaine. The deep layer the wound was closed with interrupted 3-0 Vicryl stitches. The skin was then closed with interrupted 4-0 Monocryl subcuticular stitches. Dermabond dressings were applied. The patient tolerated the procedure well. At the end of the case the needle sponge and instrument counts were correct. The patient was then awakened and taken to recovery in stable condition.  PLAN OF CARE: Discharge to home after PACU  PATIENT DISPOSITION:  PACU - hemodynamically stable.   Delay start of Pharmacological VTE agent (>24hrs) due to surgical blood loss or risk of bleeding: not applicable

## 2013-07-07 NOTE — Anesthesia Preprocedure Evaluation (Signed)
Anesthesia Evaluation  Patient identified by MRN, date of birth, ID band Patient awake    Reviewed: Allergy & Precautions, H&P , NPO status , Patient's Chart, lab work & pertinent test results  Airway Mallampati: II      Dental   Pulmonary neg pulmonary ROS,          Cardiovascular hypertension,     Neuro/Psych Anxiety Depression    GI/Hepatic Neg liver ROS, GERD-  ,  Endo/Other  negative endocrine ROS  Renal/GU Renal disease     Musculoskeletal   Abdominal   Peds  Hematology   Anesthesia Other Findings   Reproductive/Obstetrics                           Anesthesia Physical Anesthesia Plan  ASA: III  Anesthesia Plan: General   Post-op Pain Management:    Induction: Intravenous  Airway Management Planned: LMA  Additional Equipment:   Intra-op Plan:   Post-operative Plan: Extubation in OR  Informed Consent: I have reviewed the patients History and Physical, chart, labs and discussed the procedure including the risks, benefits and alternatives for the proposed anesthesia with the patient or authorized representative who has indicated his/her understanding and acceptance.   Dental advisory given  Plan Discussed with: CRNA, Anesthesiologist and Surgeon  Anesthesia Plan Comments:         Anesthesia Quick Evaluation

## 2013-07-07 NOTE — H&P (View-Only) (Signed)
Subjective:     Patient ID: Cynthia Garza, female   DOB: 05/26/1933, 80 y.o.   MRN: [id number]  HPI The patient is a 80-year-old white female who is recently diagnosed with bilateral breast cancer. Both areas appear to be stage I disease. She comes in today with questions about her upcoming surgery. We spent quite a bit of time going over her questions and the details of surgery. She is satisfied and ready to schedule.  Review of Systems  Constitutional: Negative.   HENT: Negative.   Eyes: Negative.   Respiratory: Negative.   Cardiovascular: Negative.   Gastrointestinal: Negative.   Endocrine: Negative.   Genitourinary: Negative.   Musculoskeletal: Negative.   Skin: Negative.   Allergic/Immunologic: Negative.   Neurological: Negative.   Hematological: Negative.   Psychiatric/Behavioral: Negative.        Objective:   Physical Exam  Constitutional: She is oriented to person, place, and time. She appears well-developed and well-nourished.  HENT:  Head: Normocephalic and atraumatic.  Eyes: Conjunctivae and EOM are normal. Pupils are equal, round, and reactive to light.  Neck: Normal range of motion. Neck supple.  Cardiovascular: Normal rate, regular rhythm and normal heart sounds.   Pulmonary/Chest: Effort normal and breath sounds normal.  There is significant bruising in the right breast. Otherwise there is no palpable mass in either breast. There is no palpable axillary, supraclavicular, or cervical lymphadenopathy  Abdominal: Soft. Bowel sounds are normal.  Musculoskeletal: Normal range of motion.  Lymphadenopathy:    She has no cervical adenopathy.  Neurological: She is alert and oriented to person, place, and time.  Skin: Skin is warm and dry.  Psychiatric: She has a normal mood and affect. Her behavior is normal.       Assessment:     The patient has bilateral breast cancer. The options for surgical treatment were discussed in detail and she favors breast  conservation. I think this is a reasonable choice.     Plan:     We will plan for bilateral wire localized lumpectomies and sentinel node mapping       

## 2013-07-07 NOTE — Transfer of Care (Signed)
Immediate Anesthesia Transfer of Care Note  Patient: Cynthia Garza  Procedure(s) Performed: Procedure(s): BREAST LUMPECTOMY WITH NEEDLE LOCALIZATION AND AXILLARY SENTINEL LYMPH NODE BIOPIES (Bilateral)  Patient Location: PACU  Anesthesia Type:General  Level of Consciousness: awake, alert  and oriented  Airway & Oxygen Therapy: Patient Spontanous Breathing and Patient connected to nasal cannula oxygen  Post-op Assessment: Report given to PACU RN and Post -op Vital signs reviewed and stable  Post vital signs: Reviewed and stable  Complications: No apparent anesthesia complications

## 2013-07-08 ENCOUNTER — Encounter (HOSPITAL_COMMUNITY): Payer: Self-pay | Admitting: General Surgery

## 2013-07-08 ENCOUNTER — Ambulatory Visit: Payer: Medicare Other | Admitting: Oncology

## 2013-07-18 ENCOUNTER — Ambulatory Visit (INDEPENDENT_AMBULATORY_CARE_PROVIDER_SITE_OTHER): Payer: Medicare Other | Admitting: General Surgery

## 2013-07-18 VITALS — HR 80 | Temp 98.1°F | Resp 16 | Ht 61.0 in | Wt 140.8 lb

## 2013-07-18 DIAGNOSIS — C50919 Malignant neoplasm of unspecified site of unspecified female breast: Secondary | ICD-10-CM

## 2013-07-18 NOTE — Patient Instructions (Signed)
Will refer to medical and radiation oncology

## 2013-07-18 NOTE — Progress Notes (Signed)
Subjective:     Patient ID: JANE BIRKEL, female   DOB: 1932/06/20, 80 y.o.   MRN: [id number]  HPI The patient is a 80 year old white female who is 2 weeks status post lumpectomy and sentinel node biopsy on the left breast for a T1 B. N0 cancer. She also had a right lumpectomy and sentinel node biopsy for a T1 C. N0 right breast cancer. The second lumpectomy on the right showed DCIS with a close lateral and inferior margin but there was no ink on tumor. She tolerated the surgery well. Her only complaint is of some soreness in the left axilla.  Review of Systems     Objective:   Physical Exam On exam all of her incisions are healing nicely with no sign of infection. She may have a small seroma in the left axilla but it is not big enough to try to aspirate.    Assessment:     The patient is 2 weeks status post a single lumpectomy on the left and 2 lumpectomies on the right for breast cancer and DCIS     Plan:     At this point I will refer her back to the medical and radiation oncologists talk about adjuvant therapy. If if they feel that her margins need to be reexcised and we will bring her back in and talk about this operation. I will plan to see her back in 2 weeks to discuss the findings.

## 2013-07-20 NOTE — Addendum Note (Signed)
Addended by: Ivor Costa on: 07/20/2013 04:02 PM   Modules accepted: Orders

## 2013-07-22 ENCOUNTER — Encounter: Payer: Self-pay | Admitting: *Deleted

## 2013-07-22 NOTE — [facility] Oncology Navigator Note (Signed)
Oncotype ordered 07/22/13.  Faxed requisition to pathology and confirmed receipt with Jeannie.  Appt. With Dr. Humphrey Rolls 08/09/13.

## 2013-07-27 ENCOUNTER — Ambulatory Visit: Payer: Medicare Other

## 2013-07-27 ENCOUNTER — Other Ambulatory Visit: Payer: Self-pay | Admitting: Radiation Oncology

## 2013-07-27 ENCOUNTER — Encounter: Payer: Self-pay | Admitting: Radiation Oncology

## 2013-07-27 ENCOUNTER — Ambulatory Visit: Payer: Medicare Other | Admitting: Radiation Oncology

## 2013-07-27 NOTE — Progress Notes (Signed)
Location of Breast Cancer: bilateral breast cancer  Histology per Pathology Report:  05/30/13 Diagnosis Breast, right, needle core biopsy, mass, against chest wall - INVASIVE DUCTAL CARCINOMA. - DUCTAL CARCINOMA IN SITU WITH CALCIFICATIONS. - LYMPHOVASCULAR INVASION IS IDENTIFIED. - SEE COMMENT.  06/07/13 Diagnosis Breast, left, needle core biopsy - INVASIVE MAMMARY CARCINOMA. - SEE COMMENT.  06/14/13 Diagnosis 1. Breast, right, needle core biopsy, just anterior to known Ca - FIBROCYSTIC CHANGES WITH CALCIFICATIONS, USUAL DUCTAL HYPERPLASIA AND FIBROADENOMATOID CHANGE. - PSEUDOANGIOMATOUS STROMAL HYPERPLASIA ([place]). - NO MALIGNANCY IDENTIFIED. 2. Breast, right, needle core biopsy, anterior - COMPLEX SCLEROSING LESION WITH USUAL DUCTAL HYPERPLASIA. - FIBROCYSTIC CHANGE WITH CALCIFICATIONS AND USUAL DUCTAL HYPERPLASIA. - ATYPICAL LOBULAR HYPERPLASIA. - SEE MICROSCOPIC DESCRIPTION  07/07/13 Diagnosis 1. Breast, lumpectomy, Left - INVASIVE DUCTAL CARCINOMA, GRADE 2 OF 3, SPANNING 0.7 CM. - DUCTAL CARCINOMA IN SITU. - RESECTIONS MARGINS NEGATIVE. - BIOPSY SITE CHANGE. - SEE ONCOLOGY TABLE. 2. Lymph node, sentinel, biopsy, Left Axillary #1 - NO MALIGNANCY IDENTIFIED IN ONE OF ONE LYMPH NODE (0/1). 3. Lymph node, biopsy, left Axillary #1 - NO MALIGNANCY IDENTIFIED IN ONE OF ONE LYMPH NODE (0/1). 4. Lymph node, biopsy, left Axillary #2 - NO MALIGNANCY IDENTIFIED IN ONE OF ONE LYMPH NODE (0/1). 5. Lymph node, sentinel, biopsy, Left Axillary #2 - NO MALIGNANCY IDENTIFIED IN ONE OF ONE LYMPH NODE (0/1). 6. Lymph node, sentinel, biopsy, Left Axillary #2 - NO MALIGNANCY IDENTIFIED IN ONE OF ONE LYMPH NODE (0/1). 7. Lymph node, sentinel, biopsy, Right Axillary #1 - NO MALIGNANCY IDENTIFIED IN ONE OF ONE LYMPH NODE (0/1). 8. Lymph node, sentinel, biopsy, Right Axillary #1 - NO MALIGNANCY IDENTIFIED IN ONE OF ONE LYMPH NODE (0/1). 9. Lymph node, sentinel, biopsy, Right Axillary #1 -  NO MALIGNANCY IDENTIFIED IN ONE OF ONE LYMPH NODE (0/1). 10. Lymph node, biopsy, Right Axillary #2 - NO MALIGNANCY IDENTIFIED IN ONE OF ONE LYMPH NODE (0/1). 11. Lymph node, biopsy, Right Axillary #2 - NO MALIGNANCY IDENTIFIED IN ONE OF ONE LYMPH NODE (0/1). 12. Lymph node, biopsy, Right Axillary - NO MALIGNANCY IDENTIFIED IN ONE OF ONE LYMPH NODE (0/1). 13. Breast, lumpectomy, Right, superior - INVASIVE DUCTAL CARCINOMA, GRADE 2 OF 3, SPANNING 1.3 CM. - DUCTAL CARCINOMA IN SITU, LOW GRADE. - INVASIVE CARCINOMA COMES TO <0.2 CM OF POSTERIOR MARGIN. - BIOPSY SITE CHANGE. - FIBROCYSTIC CHANGE WITH CALCIFICATIONS. - SEE ONCOLOGY TABLE. 14. Breast, lumpectomy, Right, inferior and lateral - DUCTAL CARCINOMA IN SITU, HIGH GRADE. - CARCINOMA IN SITU COMES TO WITHIN <0.2 CM OF THE INFERIOR AND LATERAL MARGINS. - BIOPSY SITE CHANGE. - FIBROCYSTIC CHANGE AND USUAL DUCTAL HYPERPLASIA WITH CALCIFICATIONS. - SEE ONCOLOGY TABLE.  Receptor Status: ER(100%), PR (18%), Her2-neu (-)  Did patient present with symptoms (if so, please note symptoms) or was this found on screening mammography?: 6 month FU of breast calcifications  Past/Anticipated interventions by surgeon, if any: bilateral lumpectomies  Past/Anticipated interventions by medical oncology, if any: Chemotherapy, Dr Humphrey Rolls:  on the final pathology we will consider sending an Oncotype DX testing to determine whether or not she needs chemotherapy.   ecause patient's tumor is estrogen receptor positive she would be a good candidate for antiestrogen therapy. I discussed tamoxifen versus aromatase inhibitors. We discussed the rationale. We also discussed some of the potential side effects.   I will plan on seeing the patient back after her surgery. Pt's next appt w/Dr Humphrey Rolls on 08/09/13  Lymphedema issues, if any: No   Pain issues, if any: Soreness  SAFETY ISSUES:  Prior radiation? no  Pacemaker/ICD? no  Possible current pregnancy? no  Is  the patient on methotrexate? no  Current Complaints / other details:   Menarche at age 20. First live birth was at 30-1/78 years of age. She's had 2 term births. She underwent menopause in 1977, no hormone replacement therapy.     Andria Rhein, RN 07/27/2013,5:13 PM

## 2013-07-28 ENCOUNTER — Ambulatory Visit
Admission: RE | Admit: 2013-07-28 | Discharge: 2013-07-28 | Disposition: A | Discharge status: Home / Self Care | Payer: Medicare Other | Source: Ambulatory Visit | Attending: Radiation Oncology | Admitting: Radiation Oncology

## 2013-07-28 ENCOUNTER — Encounter: Payer: Self-pay | Admitting: Radiation Oncology

## 2013-07-28 VITALS — BP 187/90 | HR 111 | Temp 98.5°F | Wt 138.5 lb

## 2013-07-28 DIAGNOSIS — C50419 Malignant neoplasm of upper-outer quadrant of unspecified female breast: Secondary | ICD-10-CM | POA: Insufficient documentation

## 2013-07-28 DIAGNOSIS — Y842 Radiological procedure and radiotherapy as the cause of abnormal reaction of the patient, or of later complication, without mention of misadventure at the time of the procedure: Secondary | ICD-10-CM | POA: Insufficient documentation

## 2013-07-28 DIAGNOSIS — L589 Radiodermatitis, unspecified: Secondary | ICD-10-CM | POA: Diagnosis not present

## 2013-07-28 DIAGNOSIS — Z510 Encounter for antineoplastic radiation therapy: Secondary | ICD-10-CM | POA: Diagnosis not present

## 2013-07-28 DIAGNOSIS — C50411 Malignant neoplasm of upper-outer quadrant of right female breast: Secondary | ICD-10-CM

## 2013-07-28 DIAGNOSIS — C50919 Malignant neoplasm of unspecified site of unspecified female breast: Secondary | ICD-10-CM | POA: Insufficient documentation

## 2013-07-28 NOTE — Progress Notes (Signed)
Please see the Nurse Progress Note in the MD Initial Consult Encounter for this patient. 

## 2013-07-28 NOTE — Progress Notes (Signed)
Department of Radiation Oncology  Phone:  [phone] Fax:        [phone]   Name: Cynthia Garza MRN: [id number]  DOB: [birth year]  Date: 07/28/2013  Follow Up Visit Note  Diagnosis: DCIS of the right breast and T1cNo Invasive ductal carcinoma of the left breast  Interval History: Cynthia Garza presents today for routine followup.  She had bilateral lumpectomies on 2/12 which showed a 0.7 cm area of invasive ductal carcinoma on the left measuring 0.7 cm with negative margins and 5 negative lymph nodes.  This tumor was ER and PR positive. On the right she had 2 areas excised including a superior lesion which was a 1.3 cm invasive ductal carcinoma measuring 1.3 cm with a close posterior margin (focal) and associated DCIS.  A second lesion in the right breast was DCIS (high grade and fibrocycstic change with DCIS broadly <0.2 cm from the inferior and lateral margins). An oncotype was ordered and was low so chemotherapy was not recommended per my conversation with Dr. Humphrey Rolls. She has healed up well and is ready to begin RT.  She has good range of motion and no arm swelling. She is accompanied by her husband.   Allergies:  Allergies  Allergen Reactions  . Simvastatin Other (See Comments)    aches  . Diflucan [Fluconazole] Rash    Medications:  Current Outpatient Prescriptions  Medication Sig Dispense Refill  . aspirin 81 MG tablet Take 81 mg by mouth daily.      Marland Kitchen buPROPion (WELLBUTRIN XL) 150 MG 24 hr tablet Take 150 mg by mouth daily.      . Carboxymethylcellul-Glycerin (OPTIVE) 0.5-0.9 % SOLN Place 1-2 drops into both eyes at bedtime. For dry eyes      . losartan-hydrochlorothiazide (HYZAAR) 100-25 MG per tablet Take 1 tablet by mouth daily.      . Multiple Vitamins-Minerals (ALIVE ONCE DAILY WOMENS 50+) TABS Take 1 tablet by mouth daily.      . ranitidine (ZANTAC) 150 MG tablet Take 150 mg by mouth 2 (two) times daily.      . vitamin B-12 (CYANOCOBALAMIN) 1000 MCG tablet Take 1,000  mcg by mouth daily.      Marland Kitchen zolpidem (AMBIEN) 10 MG tablet Take 10 mg by mouth at bedtime as needed for sleep.      . meclizine (ANTIVERT) 12.5 MG tablet Take 12.5 mg by mouth 3 (three) times daily as needed for dizziness.       Current Facility-Administered Medications  Medication Dose Route Frequency Provider Last Rate Last Dose  . methylPREDNISolone acetate (DEPO-MEDROL) injection 80 mg  80 mg Intra-articular Once Cassandria Anger, MD        Physical Exam:  Filed Vitals:   07/28/13 1020  BP: 187/90  Pulse: 111  Temp: 98.5 F (36.9 C)  Weight: 138 lb 8 oz (62.823 kg)  SpO2: 95%   Bilateral healing incisions  IMPRESSION: Cynthia Garza is a 80 y.o. female s/p bilateral lumpectomies (2 on the right) with close margins with DCIS)  PLAN:  I talked to [name] and her husband today regarding radiation in the management of her bilateral breast cancers. We discussed the process of simulation and the placement of tattoos. We discussed 4 weeks of treatment as an outpatient. We discussed skin redness and fatigue as possible side effects. We discussed the low likelihood of secondary malignancies and symptomatic lung, rib or heat damage.  The addition DCIS on the right was only seen by MRI so I do  not believe a preRT mammogram will be helpful.  We will proceed on with RT at this time.  We could consider re-excision to ensure negative margins on the right but I fear her cosmesis would suffer and she would ultimately end up with a near mastectomy. For that reason, we will proceed and I discussed with her she has a slightly increased risk of recurrent DCIS because of this margin. She is comfortable with proceeding forward.     Thea Silversmith, MD

## 2013-07-29 NOTE — Progress Notes (Addendum)
Name: Cynthia Garza   MRN: [id number]  Date:  07/28/13  DOB: [birth year]  Status:outpatient    DIAGNOSIS: Breast cancer.  CONSENT VERIFIED: yes   SET UP: Patient is setup supine   IMMOBILIZATION:  The following immobilization was used:Custom Moldable Pillow, breast board.   NARRATIVE: Ms. Rommel was brought to the [place].  Identity was confirmed.  All relevant records and images related to the planned course of therapy were reviewed.  Then, the patient was positioned in a stable reproducible clinical set-up for radiation therapy.  Wires were placed to delineate the clinical extent of breast tissue. A wire was placed on the scar as well.  CT images were obtained.  An isocenter was placed. Skin markings were placed.  The CT images were loaded into the planning software where the target and avoidance structures were contoured.  The radiation prescription was entered and confirmed. The patient was discharged in stable condition and tolerated simulation well.    TREATMENT PLANNING NOTE:  Treatment planning then occurred. I have requested : MLC's, isodose plan, basic dose calculation  3D simulation is performed and requested.  I have requested and analyzed a DVH of the bilateral breasts noting the absence of overlap medially and the bilateral lungs and heart.   I personally designed and supervised the construction of 5 medically necessary complex treatment devices for the protection of critical normal structures including the lungs and contralateral breast as well as the immobilization device which is necessary for set up certainty.

## 2013-08-01 DIAGNOSIS — C50919 Malignant neoplasm of unspecified site of unspecified female breast: Secondary | ICD-10-CM | POA: Diagnosis not present

## 2013-08-02 ENCOUNTER — Ambulatory Visit (INDEPENDENT_AMBULATORY_CARE_PROVIDER_SITE_OTHER): Payer: Medicare Other | Admitting: General Surgery

## 2013-08-02 ENCOUNTER — Encounter: Payer: Self-pay | Admitting: *Deleted

## 2013-08-02 ENCOUNTER — Encounter (HOSPITAL_COMMUNITY): Payer: Self-pay

## 2013-08-02 ENCOUNTER — Encounter (INDEPENDENT_AMBULATORY_CARE_PROVIDER_SITE_OTHER): Payer: Self-pay | Admitting: General Surgery

## 2013-08-02 VITALS — BP 146/90 | HR 75 | Temp 98.1°F | Resp 14 | Ht 61.0 in | Wt 134.0 lb

## 2013-08-02 DIAGNOSIS — C50919 Malignant neoplasm of unspecified site of unspecified female breast: Secondary | ICD-10-CM

## 2013-08-02 NOTE — Progress Notes (Signed)
Received Oncotype Dx score of 0%.  Gave copy to Dr. Humphrey Rolls.  Sent to MR to scan into pt chart.

## 2013-08-02 NOTE — Progress Notes (Signed)
Received Oncotype

## 2013-08-02 NOTE — Patient Instructions (Signed)
Plan to start radiation

## 2013-08-02 NOTE — Progress Notes (Signed)
Subjective:     Patient ID: Cynthia Garza, female   DOB: Mar 28, 1933, 80 y.o.   MRN: [id number]  HPI The patient is a 80 year old white female who is one month status post bilateral lumpectomies and bilateral sentinel lymph node biopsies for a right-sided T1 C N0 breast cancer and a left-sided T1B N0 breast cancer. She tolerated the surgery well. She has no specific complaints today but she says sometimes she feels good and Some days she feels bad. She denies any breast pain.  Review of Systems     Objective:   Physical Exam On exam the incisions on both breasts are healing very nicely with no sign of infection or significant seroma.    Assessment:     The patient is one month status post bilateral lumpectomies for bilateral breast cancer     Plan:     At this point she will begin radiation therapy in the near future. She will need to followup with medical oncology did talk about antiestrogen therapy. I will plan to see her back in a couple months to check her progress.

## 2013-08-03 DIAGNOSIS — Z510 Encounter for antineoplastic radiation therapy: Secondary | ICD-10-CM | POA: Diagnosis not present

## 2013-08-03 DIAGNOSIS — C50419 Malignant neoplasm of upper-outer quadrant of unspecified female breast: Secondary | ICD-10-CM | POA: Diagnosis not present

## 2013-08-03 DIAGNOSIS — L589 Radiodermatitis, unspecified: Secondary | ICD-10-CM | POA: Diagnosis not present

## 2013-08-03 NOTE — Progress Notes (Signed)
Rcvd report from [facility] - provided to Dr. Humphrey Rolls

## 2013-08-04 ENCOUNTER — Ambulatory Visit
Admission: RE | Admit: 2013-08-04 | Discharge: 2013-08-04 | Disposition: A | Discharge status: Home / Self Care | Payer: Medicare Other | Source: Ambulatory Visit | Attending: Radiation Oncology | Admitting: Radiation Oncology

## 2013-08-04 DIAGNOSIS — Z510 Encounter for antineoplastic radiation therapy: Secondary | ICD-10-CM | POA: Diagnosis not present

## 2013-08-04 DIAGNOSIS — C50411 Malignant neoplasm of upper-outer quadrant of right female breast: Secondary | ICD-10-CM

## 2013-08-04 DIAGNOSIS — L589 Radiodermatitis, unspecified: Secondary | ICD-10-CM | POA: Diagnosis not present

## 2013-08-04 DIAGNOSIS — C50419 Malignant neoplasm of upper-outer quadrant of unspecified female breast: Secondary | ICD-10-CM | POA: Diagnosis not present

## 2013-08-04 NOTE — Progress Notes (Signed)
  Radiation Oncology         ([phone]) [phone] ________________________________  Name: Cynthia Garza MRN: [id number]  Date: 08/04/2013  DOB: Mar 12, 1933  Simulation Verification Note  Status: outpatient  NARRATIVE: The patient was brought to the treatment unit and placed in the planned treatment position. The clinical setup was verified. Then port films were obtained and uploaded to the radiation oncology medical record software.  The treatment beams were carefully compared against the planned radiation fields. The position location and shape of the radiation fields was reviewed. They targeted volume of tissue appears to be appropriately covered by the radiation beams. Organs at risk appear to be excluded as planned.  Based on my personal review, I approved the simulation verification. The patient's treatment will proceed as planned.  -----------------------------------  Blair Promise, PhD, MD

## 2013-08-08 ENCOUNTER — Ambulatory Visit
Admission: RE | Admit: 2013-08-08 | Discharge: 2013-08-08 | Disposition: A | Discharge status: Home / Self Care | Payer: Medicare Other | Source: Ambulatory Visit | Attending: Radiation Oncology | Admitting: Radiation Oncology

## 2013-08-08 DIAGNOSIS — C50419 Malignant neoplasm of upper-outer quadrant of unspecified female breast: Secondary | ICD-10-CM | POA: Diagnosis not present

## 2013-08-08 DIAGNOSIS — Z510 Encounter for antineoplastic radiation therapy: Secondary | ICD-10-CM | POA: Diagnosis not present

## 2013-08-08 DIAGNOSIS — L589 Radiodermatitis, unspecified: Secondary | ICD-10-CM | POA: Diagnosis not present

## 2013-08-09 ENCOUNTER — Telehealth: Payer: Self-pay | Admitting: *Deleted

## 2013-08-09 ENCOUNTER — Ambulatory Visit
Admission: RE | Admit: 2013-08-09 | Discharge: 2013-08-09 | Disposition: A | Discharge status: Home / Self Care | Payer: Medicare Other | Source: Ambulatory Visit | Attending: Radiation Oncology | Admitting: Radiation Oncology

## 2013-08-09 ENCOUNTER — Ambulatory Visit (HOSPITAL_BASED_OUTPATIENT_CLINIC_OR_DEPARTMENT_OTHER): Payer: Medicare Other | Admitting: Oncology

## 2013-08-09 ENCOUNTER — Encounter: Payer: Self-pay | Admitting: Radiation Oncology

## 2013-08-09 ENCOUNTER — Encounter: Payer: Self-pay | Admitting: Oncology

## 2013-08-09 ENCOUNTER — Encounter: Payer: Self-pay | Admitting: Internal Medicine

## 2013-08-09 ENCOUNTER — Ambulatory Visit (INDEPENDENT_AMBULATORY_CARE_PROVIDER_SITE_OTHER): Payer: Medicare Other | Admitting: Internal Medicine

## 2013-08-09 VITALS — BP 170/104 | HR 80 | Temp 97.8°F | Resp 16 | Wt 137.0 lb

## 2013-08-09 VITALS — BP 204/82 | HR 75 | Temp 97.5°F | Resp 18 | Ht 61.0 in | Wt 138.2 lb

## 2013-08-09 VITALS — BP 198/103 | HR 73 | Temp 97.6°F | Resp 20 | Wt 137.7 lb

## 2013-08-09 DIAGNOSIS — I10 Essential (primary) hypertension: Secondary | ICD-10-CM

## 2013-08-09 DIAGNOSIS — Z510 Encounter for antineoplastic radiation therapy: Secondary | ICD-10-CM | POA: Diagnosis not present

## 2013-08-09 DIAGNOSIS — C50411 Malignant neoplasm of upper-outer quadrant of right female breast: Secondary | ICD-10-CM

## 2013-08-09 DIAGNOSIS — F329 Major depressive disorder, single episode, unspecified: Secondary | ICD-10-CM | POA: Diagnosis not present

## 2013-08-09 DIAGNOSIS — F3289 Other specified depressive episodes: Secondary | ICD-10-CM

## 2013-08-09 DIAGNOSIS — L589 Radiodermatitis, unspecified: Secondary | ICD-10-CM | POA: Diagnosis not present

## 2013-08-09 DIAGNOSIS — L089 Local infection of the skin and subcutaneous tissue, unspecified: Secondary | ICD-10-CM | POA: Diagnosis not present

## 2013-08-09 DIAGNOSIS — L570 Actinic keratosis: Secondary | ICD-10-CM | POA: Diagnosis not present

## 2013-08-09 DIAGNOSIS — C50419 Malignant neoplasm of upper-outer quadrant of unspecified female breast: Secondary | ICD-10-CM | POA: Diagnosis not present

## 2013-08-09 DIAGNOSIS — Z170 Estrogen receptor positive status [ER+]: Secondary | ICD-10-CM

## 2013-08-09 MED ORDER — ZOLPIDEM TARTRATE 10 MG PO TABS: 10.0000 mg | ORAL_TABLET | Freq: Every evening | ORAL | Status: DC | PRN

## 2013-08-09 MED ORDER — AMLODIPINE BESYLATE 5 MG PO TABS: 5.0000 mg | ORAL_TABLET | Freq: Every day | ORAL | Status: DC

## 2013-08-09 MED ORDER — ALRA NON-METALLIC DEODORANT (RAD-ONC)
1.0000 "application " | Freq: Once | TOPICAL | Status: AC
  Administered 2013-08-09: 1 via TOPICAL

## 2013-08-09 MED ORDER — RADIAPLEXRX EX GEL
Freq: Once | CUTANEOUS | Status: AC
  Administered 2013-08-09: 14:00:00 via TOPICAL

## 2013-08-09 NOTE — Progress Notes (Signed)
Pre visit review using our clinic review tool, if applicable. No additional management support is needed unless otherwise documented below in the visit note. 

## 2013-08-09 NOTE — Assessment & Plan Note (Signed)
Continue with current prescription therapy as reflected on the Med list.  

## 2013-08-09 NOTE — Assessment & Plan Note (Signed)
NAS diet  Added Rx

## 2013-08-09 NOTE — Assessment & Plan Note (Signed)
She had a breast ca surgery on 07/07/13. Just started XRT

## 2013-08-09 NOTE — Telephone Encounter (Signed)
appts made and printed...td 

## 2013-08-09 NOTE — Telephone Encounter (Signed)
Called Dr.Plotnikov office [phone] spoke with [name],receptionist, patient requesting to see MD today, high b/p,  "yes patient can be seen by Dr.Plotnikov today at 2:15pm, thanked [name], gave information to pateint and printed AVS showing patient's vitals today,patient thanked this RN for getting this appt made 1:52 PM

## 2013-08-09 NOTE — Progress Notes (Signed)
    Subjective:    HPI  C/o elev BP lately -- SBP 180-200  She had a breast ca surgery on 07/07/13. Just started XRT  F/u HTN, insomnia, fatigue, UTI  Sleeping better  Ureter bx was ok  BP Readings from Last 3 Encounters:  08/09/13 170/104  08/09/13 198/103  08/09/13 204/82   Wt Readings from Last 3 Encounters:  08/09/13 137 lb (62.143 kg)  08/09/13 137 lb 11.2 oz (62.46 kg)  08/09/13 138 lb 3.2 oz (62.687 kg)      Past Medical History  Diagnosis Date  . Hemangioma of intra-abdominal structures   . Renal cyst   . Shingles   . Paresthesia   . Heart murmur   . Dyslipidemia   . GERD (gastroesophageal reflux disease)   . Osteoporosis   . Insomnia   . Depression   . HTN (hypertension)   . LBP (low back pain)   . Anxiety   . Breast cancer 2015    ER+/PR+/Her2-    Review of Systems  Constitutional: Positive for activity change. Negative for unexpected weight change.  Cardiovascular: Negative for chest pain.  Gastrointestinal: Positive for nausea. Negative for abdominal pain and constipation.  Genitourinary: Negative for dysuria, frequency, flank pain and difficulty urinating.  Musculoskeletal: Negative for back pain, gait problem, myalgias and neck pain.  Psychiatric/Behavioral: Positive for sleep disturbance. The patient is nervous/anxious.        Objective:   Physical Exam  Constitutional: She appears well-nourished.  Skin: No rash noted.   BP 170/104  Pulse 80  Temp(Src) 97.8 F (36.6 C) (Oral)  Resp 16  Wt 137 lb (62.143 kg)  Constitutional: She appears fatigued but less ill than last week, well-developed and well-nourished. No acute distress. Spouse at side Neck: Normal range of motion. Neck supple. No JVD or LAD present. No thyromegaly present.  Cardiovascular: fast rate but regular rhythm, normal heart sounds.  No murmur heard. No BLE edema. Pulmonary/Chest: Effort normal and breath sounds normal. No respiratory distress. She has no wheezes.   Abdominal: Soft. Bowel sounds are present. She exhibits no distension. There is no tenderness, rebound or gaurding. no masses Neurological: She is alert and oriented to person, place, and time. No cranial nerve deficit. Coordination normal.  Skin: Skin is warm and dry. No rash noted. No erythema.  Psychiatric: pt seem slightly anxious and despondent, but smiles at jokes. her behavior is normal. Judgment and thought content normal.    Lab Results  Component Value Date   WBC 9.9 06/30/2013   HGB 14.3 06/30/2013   HCT 40.7 06/30/2013   PLT 222 06/30/2013   GLUCOSE 94 06/30/2013   CHOL 240* 02/09/2012   TRIG 151.0* 02/09/2012   HDL 68.90 02/09/2012   LDLDIRECT 144.7 02/09/2012   LDLCALC 96 04/26/2007   ALT 9 06/08/2013   AST 13 06/08/2013   NA 143 06/30/2013   K 4.0 06/30/2013   CL 103 06/30/2013   CREATININE 0.87 06/30/2013   BUN 18 06/30/2013   CO2 27 06/30/2013   TSH 1.29 02/09/2012   INR 0.95 01/15/2012   HGBA1C 5.7 01/02/2011      Assessment & Plan:

## 2013-08-09 NOTE — Progress Notes (Signed)
OFFICE PROGRESS NOTE  CC  Walker Kehr, MD [address] [facility] [address] [place] [address] Dr. Autumn Messing  Dr. Thea Silversmith  DIAGNOSIS: 80 year old female with new diagnosis of right breast cancer with a suspicious lesion on the left side.  Breast cancer of upper-outer quadrant of right female breast   Primary site: Breast (Right)   Staging method: AJCC 7th Edition   Clinical: Stage IA (T1c, N0, cM0)   Summary: Stage IA (T1c, N0, cM0)  PRIOR THERAPY: 1. who underwent a six-month followup mammogram for left-sided abnormalities. On her mammogram she was noted to have a right breast mass at the 12:00 position. By ultrasound it measured 1.5 cm. Patient went on to have a biopsy performed of this mass. This revealed intermediate grade invasive ductal carcinoma ER positive PR positive HER-2/neu negative with a proliferation marker Ki-67 22%. She had MRI of the breasts performed. The MRI showed a 1.3 cm enhancement known for the by biopsy with 2 small satellite lesion. She was also noted to have a another linear enhancement as well in the right breast. On the left side she was noted to have a 7 mm mass by ultrasound this measured 8 mm this was at the 7:00 position. Patient had a biopsy of this performed. By pathology report report this is consistent with an invasive cancer.   2. status post lumpectomy performed on 07/07/2013 of the left breast. The final pathology revealed a 0.7 cm invasive ductal carcinoma with ductal carcinoma in situ. Sentinel lymph node was negative for metastatic disease. Tumor was ER positive PR positive HER-2/neu negative with a proliferation marker Ki-67 13%. Stage I (T1 N0)  #3 patient had Oncotype DX testing performed her breast cancer recurrence score was 0 giving her a 3% risk of distant recurrence with tamoxifen for 5 years. Patient will not need chemotherapy but will need antiestrogen therapy   CURRENT THERAPY: Proceed with radiation  INTERVAL  HISTORY: Cynthia Garza 80 y.o. female returns for followup visit post lumpectomy. Postoperatively she is doing well. Patient is very concerned about her blood pressures. It is elevated. But she is asymptomatic. Surgical site looks good. Today she denies any headaches double vision blurring of vision fevers chills night sweats. No shortness of breath chest pains palpitations. No abdominal pain no diarrhea or constipation. She has no easy bruising or bleeding. She has no myalgias and arthralgias. No peripheral paresthesias or gait disturbances. Remainder of the 10 point review of systems is negative.   MEDICAL HISTORY: Past Medical History  Diagnosis Date  . Hemangioma of intra-abdominal structures   . Renal cyst   . Shingles   . Paresthesia   . Heart murmur   . Dyslipidemia   . GERD (gastroesophageal reflux disease)   . Osteoporosis   . Insomnia   . Depression   . HTN (hypertension)   . LBP (low back pain)   . Anxiety   . Breast cancer 2015    ER+/PR+/Her2-    ALLERGIES:  is allergic to simvastatin and diflucan.  MEDICATIONS:  Current Outpatient Prescriptions  Medication Sig Dispense Refill  . aspirin 81 MG tablet Take 81 mg by mouth daily.      Marland Kitchen buPROPion (WELLBUTRIN XL) 150 MG 24 hr tablet Take 150 mg by mouth daily.      . Carboxymethylcellul-Glycerin (OPTIVE) 0.5-0.9 % SOLN Place 1-2 drops into both eyes at bedtime. For dry eyes      . losartan-hydrochlorothiazide (HYZAAR) 100-25 MG  per tablet Take 1 tablet by mouth daily.      . meclizine (ANTIVERT) 12.5 MG tablet Take 12.5 mg by mouth 3 (three) times daily as needed for dizziness.      . Multiple Vitamins-Minerals (ALIVE ONCE DAILY WOMENS 50+) TABS Take 1 tablet by mouth daily.      . ranitidine (ZANTAC) 150 MG tablet Take 150 mg by mouth 2 (two) times daily.      . vitamin B-12 (CYANOCOBALAMIN) 1000 MCG tablet Take 1,000 mcg by mouth daily.      Marland Kitchen zolpidem (AMBIEN) 10 MG tablet Take 10 mg by mouth at bedtime as  needed for sleep.       Current Facility-Administered Medications  Medication Dose Route Frequency Provider Last Rate Last Dose  . methylPREDNISolone acetate (DEPO-MEDROL) injection 80 mg  80 mg Intra-articular Once Cassandria Anger, MD        SURGICAL HISTORY:  Past Surgical History  Procedure Laterality Date  . Abdominal hysterectomy      ?1970's  . Breast lumpectomy with needle localization and axillary sentinel lymph node bx Bilateral 07/07/2013    Procedure: BREAST LUMPECTOMY WITH NEEDLE LOCALIZATION AND AXILLARY SENTINEL LYMPH NODE BIOPIES;  Surgeon: Merrie Roof, MD;  Location: [facility];  Service: General;  Laterality: Bilateral;    REVIEW OF SYSTEMS:  Pertinent items are noted in HPI.     PHYSICAL EXAMINATION: Blood pressure 204/82, pulse 75, temperature 97.5 F (36.4 C), temperature source Oral, resp. rate 18, height '5\' 1"'  (1.549 m), weight 138 lb 3.2 oz (62.687 kg). Body mass index is 26.13 kg/(m^2). ECOG PERFORMANCE STATUS: 1 - Symptomatic but completely ambulatory  Well-developed nourished elderly female in no acute distress HEENT exam: EOMI PERRLA sclerae anicteric no conjunctival pallor oral mucosa is moist neck supple no palpable cervical supraclavicular or axillary adenopathy Lungs: Clear to auscultation and percussion Cardiovascular: Regular rate rhythm no murmurs gallops or rubs Abdomen: Soft nontender nondistended bowel sounds are present no hepatosplenomegaly or palpable masses Extremities: No edema clubbing or cyanosis, pedal pulses are present Neuro: Alert oriented x3 DTRs +4 strength is symmetrical in upper and lower extremities, gait normal, but is grossly normal Skin: Warm and moist, good capillary filling, no rashes.  Breasts: right breast normal without mass, skin or nipple changes or axillary nodes, left breast normal without mass, skin or nipple changes or axillary nodes with healing surgical scar.    LABORATORY DATA: Lab Results  Component Value  Date   WBC 9.9 06/30/2013   HGB 14.3 06/30/2013   HCT 40.7 06/30/2013   MCV 83.9 06/30/2013   PLT 222 06/30/2013      Chemistry      Component Value Date/Time   NA 143 06/30/2013 1421   NA 141 06/08/2013 0820   K 4.0 06/30/2013 1421   K 3.5 06/08/2013 0820   CL 103 06/30/2013 1421   CO2 27 06/30/2013 1421   CO2 24 06/08/2013 0820   BUN 18 06/30/2013 1421   BUN 19.6 06/08/2013 0820   CREATININE 0.87 06/30/2013 1421   CREATININE 0.8 06/08/2013 0820      Component Value Date/Time   CALCIUM 9.3 06/30/2013 1421   CALCIUM 9.4 06/08/2013 0820   ALKPHOS 61 06/08/2013 0820   ALKPHOS 51 02/09/2012 1621   AST 13 06/08/2013 0820   AST 18 02/09/2012 1621   ALT 9 06/08/2013 0820   ALT 11 02/09/2012 1621   BILITOT 0.58 06/08/2013 0820   BILITOT 0.9 02/09/2012 1621  ADDITIONAL INFORMATION: 13. A sample (block 13A) was sent to [facility] for Oncotype testing. The patient's recurrence score is 0. Those patients who had a recurrence score of 0 had an average rate of distant recurrence of 3%. Enid Cutter MD Pathologist, Electronic Signature ( Signed 08/02/2013) 14. PROGNOSTIC INDICATORS - ACIS (BLOCK 14E) Results: IMMUNOHISTOCHEMICAL AND MORPHOMETRIC ANALYSIS BY THE AUTOMATED CELLULAR IMAGING SYSTEM (ACIS) Estrogen Receptor: 100%, POSITIVE, STRONG STAINING INTENSITY Progesterone Receptor: 18%, POSITIVE, STRONG STAINING INTENSITY REFERENCE RANGE ESTROGEN RECEPTOR NEGATIVE <1% POSITIVE =>1% PROGESTERONE RECEPTOR NEGATIVE <1% POSITIVE =>1% All controls stained appropriately Enid Cutter MD Pathologist, Electronic Signature ( Signed 07/13/2013) 1 of 7 FINAL for LIDIA, CLAVIJO ([id number]) FINAL DIAGNOSIS Diagnosis 1. Breast, lumpectomy, Left - INVASIVE DUCTAL CARCINOMA, GRADE 2 OF 3, SPANNING 0.7 CM. - DUCTAL CARCINOMA IN SITU. - RESECTIONS MARGINS NEGATIVE. - BIOPSY SITE CHANGE. - SEE ONCOLOGY TABLE. 2. Lymph node, sentinel, biopsy, Left Axillary #1 - NO MALIGNANCY IDENTIFIED IN ONE OF ONE LYMPH  NODE (0/1). 3. Lymph node, biopsy, left Axillary #1 - NO MALIGNANCY IDENTIFIED IN ONE OF ONE LYMPH NODE (0/1). 4. Lymph node, biopsy, left Axillary #2 - NO MALIGNANCY IDENTIFIED IN ONE OF ONE LYMPH NODE (0/1). 5. Lymph node, sentinel, biopsy, Left Axillary #2 - NO MALIGNANCY IDENTIFIED IN ONE OF ONE LYMPH NODE (0/1). 6. Lymph node, sentinel, biopsy, Left Axillary #2 - NO MALIGNANCY IDENTIFIED IN ONE OF ONE LYMPH NODE (0/1). 7. Lymph node, sentinel, biopsy, Right Axillary #1 - NO MALIGNANCY IDENTIFIED IN ONE OF ONE LYMPH NODE (0/1). 8. Lymph node, sentinel, biopsy, Right Axillary #1 - NO MALIGNANCY IDENTIFIED IN ONE OF ONE LYMPH NODE (0/1). 9. Lymph node, sentinel, biopsy, Right Axillary #1 - NO MALIGNANCY IDENTIFIED IN ONE OF ONE LYMPH NODE (0/1). 10. Lymph node, biopsy, Right Axillary #2 - NO MALIGNANCY IDENTIFIED IN ONE OF ONE LYMPH NODE (0/1). 11. Lymph node, biopsy, Right Axillary #2 - NO MALIGNANCY IDENTIFIED IN ONE OF ONE LYMPH NODE (0/1). 12. Lymph node, biopsy, Right Axillary - NO MALIGNANCY IDENTIFIED IN ONE OF ONE LYMPH NODE (0/1). 13. Breast, lumpectomy, Right, superior - INVASIVE DUCTAL CARCINOMA, GRADE 2 OF 3, SPANNING 1.3 CM. - DUCTAL CARCINOMA IN SITU, LOW GRADE. - INVASIVE CARCINOMA COMES TO <0.2 CM OF POSTERIOR MARGIN. - BIOPSY SITE CHANGE. - FIBROCYSTIC CHANGE WITH CALCIFICATIONS. - SEE ONCOLOGY TABLE. 14. Breast, lumpectomy, Right, inferior and lateral - DUCTAL CARCINOMA IN SITU, HIGH GRADE. - CARCINOMA IN SITU COMES TO WITHIN <0.2 CM OF THE INFERIOR AND LATERAL MARGINS. - BIOPSY SITE CHANGE. - FIBROCYSTIC CHANGE AND USUAL DUCTAL HYPERPLASIA WITH CALCIFICATIONS. - SEE ONCOLOGY TABLE. Microscopic Comment 1. BREAST, INVASIVE TUMOR, WITH LYMPH NODE SAMPLING Specimen, including laterality and lymph node sampling (sentinel, non-sentinel): Left breast lumpectomy, sentinel nodes, and axillary nodes. Procedure: Left lumpectomy, sentinel node biopsy, axillary  dissection. Histologic type: Invasive ductal carcinoma. Grade: 2 of 3. 2 of 7 FINAL for SUMMERS, BUENDIA ([id number]) Microscopic Comment(continued) Tubule formation: 3 Nuclear pleomorphism: 2 Mitotic:1 Tumor size (glass slide measurement): 0.7 cm Margins: Negative Invasive, distance to closest margin: 0.5 cm to posterior margin In-situ, distance to closest margin: 0.5 cm to posterior margin If margin positive, focally or broadly: N/A. Lymphovascular invasion: Absent. Ductal carcinoma in situ: Present Grade: 2 of 3. Extensive intraductal component: No. Lobular neoplasia: Not identified. Tumor focality: Unifocal. Treatment effect: N/A. If present, treatment effect in breast tissue, lymph nodes or both: NA. Extent of tumor: Confined to breast parenchyma. Lymph nodes: Examined: 1 Sentinel 4 Non-sentinel 5  Total Lymph nodes with metastasis: 0 Isolated tumor cells (< 0.2 mm): 0 Micrometastasis: (> 0.2 mm and < 2.0 mm): 0 Macrometastasis: (> 2.0 mm): 0 Extracapsular extension: 0 Breast prognostic profile: Performed on [id number], will not be repeated. Estrogen receptor: Positive, strong staining intensity. Progesterone receptor: Positive, strong staining intensity. Her 2 neu: Not amplified. Ki-67: 13% Non-neoplastic breast: Fibrocystic change with usual ductal hyperplasia, biopsy site change. TNM: pT1b, pN0 Comments: E-cadherin immunohistochemistry is positive confirming ductal carcinoma. Microcalcifications are present within the carcinoma. 2. - Pancytokeratin is negative. 4. Pancytokeratin is negative. 13. BREAST, INVASIVE TUMOR, WITH LYMPH NODE SAMPLING Specimen, including laterality and lymph node sampling (sentinel, non-sentinel): Right superior lumpectomy and axillary nodes. Procedure: Right lumpectomy and axillary dissection. Histologic type: Invasive ductal carcinoma. Grade: Grade 2 of 3. Tubule formation: 3 Nuclear pleomorphism: 1 Mitotic: 2 Tumor size  (gross measurement): 1.3 cm Margins: Invasive, distance to closest margin: <0.2 cm from posterior margin In-situ, distance to closest margin: 1 cm from posterior If margin positive, focally or broadly: focal Lymphovascular invasion: Absent. Ductal carcinoma in situ: Present. 3 of 7 FINAL for DEMARIS, BOUSQUET ([id number]) Microscopic Comment(continued) Grade: Grade 1 of 3. Extensive intraductal component: No. Lobular neoplasia: Absent. Tumor focality: Unifocal. Treatment effect: N/A. If present, treatment effect in breast tissue, lymph nodes or both: N/A. Extent of tumor: Confined to breast parenchyma. Lymph nodes: Examined: 0 Sentinel 6 Non-sentinel 6 Total Lymph nodes with metastasis: 0 Isolated tumor cells (< 0.2 mm): 0 Micrometastasis: (> 0.2 mm and < 2.0 mm): 0 Macrometastasis: (> 2.0 mm): 0 Extracapsular extension: 0 Breast prognostic profile: Performed on [id number], will not be repeated. Estrogen receptor: Positive, strong staining intensity Progesterone receptor: Positive, strong staining intensity Her 2 neu: No amplification. Ki-67: 22% Non-neoplastic breast: Fibrocystic change with calcifications, biopsy site change. TNM: pT1c, pN0 Comments: N/A. 14. BREAST, IN SITU CARCINOMA Specimen, including laterality: Right breast lumpectomy Procedure (include lymph node sampling sentinel-non-sentinel): Right breast lumpectomy and axillary nodes. Grade of carcinoma: Grade III. Necrosis: Present. Estimated tumor size: (glass slide measurement): 0.7 cm Treatment effect: N/A. If present, treatment effect in breast tissue, lymph nodes or both: N/A. Distance to closest margin: <0.2 cm from inferior and lateral margin If margin positive, focally or broadly: broadly Breast prognostic profile: Will be performed on this specimen. Lymph nodes: Same lymph nodes as included in the right superior lumpectomy. Examined: 0 Sentinel 6 Non-sentinel 6 Total Lymph nodes with metastasis:  0 Isolated tumor cells (< 0.2 mm): 0 Micrometastasis ( > 0.2 mm and < 2.0 mm): 0 Macrometastasis (> 2.0 mm): 0 Extranodal extension: 0 TNM: pTis, pN0 Comments: The ductal carcinoma in situ in this specimen is higher grade than that seen in the other specimens and is located away from the other right lumpectomy specimen, therefore prognostic markers will be ordered. Vicente Males MD Pathologist, Electronic Signature (Case signed 07/11/2013) 4 of 7   RADIOGRAPHIC STUDIES:  No results found.  ASSESSMENT/PLAN: 80 year old female with  #1 stage I (T1 N0) invasive ductal carcinoma with ductal carcinoma in situ of the left breast. Patient is status post lumpectomy with sentinel lymph node biopsy. Final pathology revealed a 0.7 cm ER positive breast cancer. She had an Oncotype DX score performed that showed a recurrence score of 0 giving her 3% risk of distant recurrence with tamoxifen only. Patient does not require chemotherapy. But certainly she would be a candidate for antiestrogen therapy. We discussed the aromatase inhibitors today.  #2 patient will also need radiation therapy. She is  also been seen by Dr. Thea Silversmith. Who is planning on starting her on radiation in the next few weeks.  #3 hypertension: I have encouraged the patient to be seen by her regular doctor to discuss possible changes in her antihypertensives to better control her blood pressures. She is going to call his office.  #4 patient and I discussed role of antiestrogen therapy. We discussed risks and benefits of it. These will start after she completes radiation therapy.  #5 followup: Patient will be seen back after completion of radiation therapy. However she knows to call me with any questions arise in the meantime.   All questions were answered. The patient knows to call the clinic with any problems, questions or concerns. We can certainly see the patient much sooner if necessary.  I spent 25 minutes counseling  the patient face to face. The total time spent in the appointment was 30 minutes.    Marcy Panning, MD Medical/Oncology [facility] [phone] (beeper) [phone] (Office)  08/09/2013, 11:59 AM

## 2013-08-09 NOTE — Progress Notes (Signed)
Weekly Management Note Current Dose:  5.34 Gy  Projected Dose:42.72  Gy   Narrative:  The patient presents for routine under treatment assessment.  CBCT/MVCT images/Port film x-rays were reviewed.  The chart was checked. Doing well (BP high). No breast related complaints.   Physical Findings: Weight: 137 lb 11.2 oz (62.46 kg). Unchanged  Impression:  The patient is tolerating radiation.  Plan:  Continue treatment as planned. Start radiaplex. RN education performed. Followed up with PCP re: High BP.

## 2013-08-09 NOTE — Patient Instructions (Signed)
Exemestane tablets  What is this medicine?  EXEMESTANE (ex e MES tane) blocks the production of the hormone estrogen. Some types of breast cancer depend on estrogen to grow, and this medicine can stop tumor growth by blocking estrogen production. This medicine is for the treatment of breast cancer in postmenopausal women only.  This medicine may be used for other purposes; ask your health care provider or pharmacist if you have questions.  COMMON BRAND NAME(S): Aromasin  What should I tell my health care provider before I take this medicine?  They need to know if you have any of these conditions:  -an unusual or allergic reaction to exemestane, other medicines, foods, dyes, or preservatives  -pregnant or trying to get pregnant  -breast-feeding  How should I use this medicine?  Take this medicine by mouth with a glass of water. Follow the directions on the prescription label. Take your doses at regular intervals after a meal. Do not take your medicine more often than directed. Do not stop taking except on the advice of your doctor or health care professional.  Contact your pediatrician regarding the use of this medicine in children. Special care may be needed.  Overdosage: If you think you have taken too much of this medicine contact a poison control center or emergency room at once.  NOTE: This medicine is only for you. Do not share this medicine with others.  What if I miss a dose?  If you miss a dose, take the next dose as usual. Do not try to make up the missed dose. Do not take double or extra doses.  What may interact with this medicine?  Do not take this medicine with any of the following medications:  -female hormones, like estrogens and birth control pills  This medicine may also interact with the following medications:  -androstenedione  -phenytoin  -rifabutin, rifampin, or rifapentine  -St. John's Wort  This list may not describe all possible interactions. Give your health care provider a list of all the  medicines, herbs, non-prescription drugs, or dietary supplements you use. Also tell them if you smoke, drink alcohol, or use illegal drugs. Some items may interact with your medicine.  What should I watch for while using this medicine?  Visit your doctor or health care professional for regular checks on your progress.  If you experience hot flashes or sweating while taking this medicine, avoid alcohol, smoking and drinks with caffeine. This may help to decrease these side effects.  What side effects may I notice from receiving this medicine?  Side effects that you should report to your doctor or health care professional as soon as possible:  -any new or unusual symptoms  -changes in vision  -fever  -leg or arm swelling  -pain in bones, joints, or muscles  -pain in hips, back, ribs, arms, shoulders, or legs  Side effects that usually do not require medical attention (report to your doctor or health care professional if they continue or are bothersome):  -difficulty sleeping  -headache  -hot flashes  -sweating  -unusually weak or tired  This list may not describe all possible side effects. Call your doctor for medical advice about side effects. You may report side effects to FDA at 1-800-FDA-1088.  Where should I keep my medicine?  Keep out of the reach of children.  Store at room temperature between 15 and 30 degrees C (59 and 86 degrees F). Throw away any unused medicine after the expiration date.  NOTE: This sheet 

## 2013-08-10 ENCOUNTER — Ambulatory Visit: Payer: Medicare Other | Admitting: Radiation Oncology

## 2013-08-10 ENCOUNTER — Ambulatory Visit
Admission: RE | Admit: 2013-08-10 | Discharge: 2013-08-10 | Disposition: A | Discharge status: Home / Self Care | Payer: Medicare Other | Source: Ambulatory Visit | Attending: Radiation Oncology | Admitting: Radiation Oncology

## 2013-08-10 ENCOUNTER — Telehealth: Payer: Self-pay | Admitting: Internal Medicine

## 2013-08-10 ENCOUNTER — Ambulatory Visit: Payer: Medicare Other

## 2013-08-10 DIAGNOSIS — Z510 Encounter for antineoplastic radiation therapy: Secondary | ICD-10-CM | POA: Diagnosis not present

## 2013-08-10 DIAGNOSIS — C50419 Malignant neoplasm of upper-outer quadrant of unspecified female breast: Secondary | ICD-10-CM | POA: Diagnosis not present

## 2013-08-10 DIAGNOSIS — L589 Radiodermatitis, unspecified: Secondary | ICD-10-CM | POA: Diagnosis not present

## 2013-08-10 NOTE — Telephone Encounter (Signed)
Relevant patient education assigned to patient using Emmi. ° °

## 2013-08-11 ENCOUNTER — Ambulatory Visit
Admission: RE | Admit: 2013-08-11 | Discharge: 2013-08-11 | Disposition: A | Discharge status: Home / Self Care | Payer: Medicare Other | Source: Ambulatory Visit | Attending: Radiation Oncology | Admitting: Radiation Oncology

## 2013-08-11 ENCOUNTER — Encounter: Payer: Self-pay | Admitting: Oncology

## 2013-08-11 DIAGNOSIS — Z510 Encounter for antineoplastic radiation therapy: Secondary | ICD-10-CM | POA: Diagnosis not present

## 2013-08-11 DIAGNOSIS — C50419 Malignant neoplasm of upper-outer quadrant of unspecified female breast: Secondary | ICD-10-CM | POA: Diagnosis not present

## 2013-08-11 DIAGNOSIS — L589 Radiodermatitis, unspecified: Secondary | ICD-10-CM | POA: Diagnosis not present

## 2013-08-12 ENCOUNTER — Ambulatory Visit
Admission: RE | Admit: 2013-08-12 | Discharge: 2013-08-12 | Disposition: A | Discharge status: Home / Self Care | Payer: Medicare Other | Source: Ambulatory Visit | Attending: Radiation Oncology | Admitting: Radiation Oncology

## 2013-08-12 DIAGNOSIS — C50419 Malignant neoplasm of upper-outer quadrant of unspecified female breast: Secondary | ICD-10-CM | POA: Diagnosis not present

## 2013-08-12 DIAGNOSIS — L589 Radiodermatitis, unspecified: Secondary | ICD-10-CM | POA: Diagnosis not present

## 2013-08-12 DIAGNOSIS — Z510 Encounter for antineoplastic radiation therapy: Secondary | ICD-10-CM | POA: Diagnosis not present

## 2013-08-15 ENCOUNTER — Ambulatory Visit
Admission: RE | Admit: 2013-08-15 | Discharge: 2013-08-15 | Disposition: A | Discharge status: Home / Self Care | Payer: Medicare Other | Source: Ambulatory Visit | Attending: Radiation Oncology | Admitting: Radiation Oncology

## 2013-08-15 DIAGNOSIS — L589 Radiodermatitis, unspecified: Secondary | ICD-10-CM | POA: Diagnosis not present

## 2013-08-15 DIAGNOSIS — C50419 Malignant neoplasm of upper-outer quadrant of unspecified female breast: Secondary | ICD-10-CM | POA: Diagnosis not present

## 2013-08-15 DIAGNOSIS — Z510 Encounter for antineoplastic radiation therapy: Secondary | ICD-10-CM | POA: Diagnosis not present

## 2013-08-16 ENCOUNTER — Ambulatory Visit
Admission: RE | Admit: 2013-08-16 | Discharge: 2013-08-16 | Disposition: A | Discharge status: Home / Self Care | Payer: Medicare Other | Source: Ambulatory Visit | Attending: Radiation Oncology | Admitting: Radiation Oncology

## 2013-08-16 ENCOUNTER — Encounter: Payer: Self-pay | Admitting: Radiation Oncology

## 2013-08-16 VITALS — BP 161/108 | HR 78 | Temp 97.7°F | Resp 20 | Wt 138.9 lb

## 2013-08-16 DIAGNOSIS — L589 Radiodermatitis, unspecified: Secondary | ICD-10-CM | POA: Diagnosis not present

## 2013-08-16 DIAGNOSIS — Z510 Encounter for antineoplastic radiation therapy: Secondary | ICD-10-CM | POA: Diagnosis not present

## 2013-08-16 DIAGNOSIS — C50411 Malignant neoplasm of upper-outer quadrant of right female breast: Secondary | ICD-10-CM

## 2013-08-16 DIAGNOSIS — C50419 Malignant neoplasm of upper-outer quadrant of unspecified female breast: Secondary | ICD-10-CM | POA: Diagnosis not present

## 2013-08-16 NOTE — Progress Notes (Signed)
Weekly rad txs, bilateral breasat 7 completed so far, erythema, skin intact, using radiaplex bid, occasional twinges in breast,resolvs quickly, still high b/p taken in right popliteal,  Was put on norvasc 5 mg at night 08/09/13, 2nd b/p med appetite good, staying hydrated,drinks a lot of water Energy level good stated 3:51 PM

## 2013-08-16 NOTE — Progress Notes (Signed)
Weekly Management Note Current Dose: 18.69  Gy  Projected Dose: 42.72 Gy   Narrative:  The patient presents for routine under treatment assessment.  CBCT/MVCT images/Port film x-rays were reviewed.  The chart was checked. Doing well. On second BP med per PCP and is feeling better. Would like to talk to her pharmacist about options for anti-estrogen pills.   Physical Findings: Weight: 138 lb 14.4 oz (63.005 kg). Unchanged  Impression:  The patient is tolerating radiation.  Plan:  Continue treatment as planned. Continue radiaplex. Gave her the names of AI and tamoxifen so that she could explore options from a cost standpoint.

## 2013-08-17 ENCOUNTER — Ambulatory Visit
Admission: RE | Admit: 2013-08-17 | Discharge: 2013-08-17 | Disposition: A | Discharge status: Home / Self Care | Payer: Medicare Other | Source: Ambulatory Visit | Attending: Radiation Oncology | Admitting: Radiation Oncology

## 2013-08-17 DIAGNOSIS — Z510 Encounter for antineoplastic radiation therapy: Secondary | ICD-10-CM | POA: Diagnosis not present

## 2013-08-17 DIAGNOSIS — L589 Radiodermatitis, unspecified: Secondary | ICD-10-CM | POA: Diagnosis not present

## 2013-08-17 DIAGNOSIS — C50419 Malignant neoplasm of upper-outer quadrant of unspecified female breast: Secondary | ICD-10-CM | POA: Diagnosis not present

## 2013-08-18 ENCOUNTER — Ambulatory Visit
Admission: RE | Admit: 2013-08-18 | Discharge: 2013-08-18 | Disposition: A | Discharge status: Home / Self Care | Payer: Medicare Other | Source: Ambulatory Visit | Attending: Radiation Oncology | Admitting: Radiation Oncology

## 2013-08-18 DIAGNOSIS — Z510 Encounter for antineoplastic radiation therapy: Secondary | ICD-10-CM | POA: Diagnosis not present

## 2013-08-18 DIAGNOSIS — C50419 Malignant neoplasm of upper-outer quadrant of unspecified female breast: Secondary | ICD-10-CM | POA: Diagnosis not present

## 2013-08-18 DIAGNOSIS — L589 Radiodermatitis, unspecified: Secondary | ICD-10-CM | POA: Diagnosis not present

## 2013-08-19 ENCOUNTER — Ambulatory Visit
Admission: RE | Admit: 2013-08-19 | Discharge: 2013-08-19 | Disposition: A | Discharge status: Home / Self Care | Payer: Medicare Other | Source: Ambulatory Visit | Attending: Radiation Oncology | Admitting: Radiation Oncology

## 2013-08-19 DIAGNOSIS — C50419 Malignant neoplasm of upper-outer quadrant of unspecified female breast: Secondary | ICD-10-CM | POA: Diagnosis not present

## 2013-08-19 DIAGNOSIS — L589 Radiodermatitis, unspecified: Secondary | ICD-10-CM | POA: Diagnosis not present

## 2013-08-19 DIAGNOSIS — Z510 Encounter for antineoplastic radiation therapy: Secondary | ICD-10-CM | POA: Diagnosis not present

## 2013-08-22 ENCOUNTER — Ambulatory Visit
Admission: RE | Admit: 2013-08-22 | Discharge: 2013-08-22 | Disposition: A | Discharge status: Home / Self Care | Payer: Medicare Other | Source: Ambulatory Visit | Attending: Radiation Oncology | Admitting: Radiation Oncology

## 2013-08-22 DIAGNOSIS — C50419 Malignant neoplasm of upper-outer quadrant of unspecified female breast: Secondary | ICD-10-CM | POA: Diagnosis not present

## 2013-08-22 DIAGNOSIS — L589 Radiodermatitis, unspecified: Secondary | ICD-10-CM | POA: Diagnosis not present

## 2013-08-22 DIAGNOSIS — Z510 Encounter for antineoplastic radiation therapy: Secondary | ICD-10-CM | POA: Diagnosis not present

## 2013-08-23 ENCOUNTER — Ambulatory Visit (INDEPENDENT_AMBULATORY_CARE_PROVIDER_SITE_OTHER): Payer: Medicare Other

## 2013-08-23 ENCOUNTER — Ambulatory Visit
Admission: RE | Admit: 2013-08-23 | Discharge: 2013-08-23 | Disposition: A | Discharge status: Home / Self Care | Payer: Medicare Other | Source: Ambulatory Visit | Attending: Radiation Oncology | Admitting: Radiation Oncology

## 2013-08-23 VITALS — BP 146/74 | HR 75 | Temp 98.0°F | Wt 138.9 lb

## 2013-08-23 VITALS — BP 152/96

## 2013-08-23 DIAGNOSIS — C50419 Malignant neoplasm of upper-outer quadrant of unspecified female breast: Secondary | ICD-10-CM | POA: Diagnosis not present

## 2013-08-23 DIAGNOSIS — Z510 Encounter for antineoplastic radiation therapy: Secondary | ICD-10-CM | POA: Diagnosis not present

## 2013-08-23 DIAGNOSIS — C50411 Malignant neoplasm of upper-outer quadrant of right female breast: Secondary | ICD-10-CM

## 2013-08-23 DIAGNOSIS — L589 Radiodermatitis, unspecified: Secondary | ICD-10-CM | POA: Diagnosis not present

## 2013-08-23 DIAGNOSIS — I10 Essential (primary) hypertension: Secondary | ICD-10-CM | POA: Diagnosis not present

## 2013-08-23 NOTE — Progress Notes (Signed)
Weekly Management Note Current Dose:  32.4 Gy  Projected Dose: 42.72 Gy   Narrative:  The patient presents for routine under treatment assessment.  CBCT/MVCT images/Port film x-rays were reviewed.  The chart was checked.  Using radiaplex. No complaints.   Physical Findings: Weight: 138 lb 14.4 oz (63.005 kg). Unchanged. Skin is slightly pink.   Impression:  The patient is tolerating radiation.  Plan:  Continue treatment as planned. Continue radiaplex

## 2013-08-23 NOTE — Progress Notes (Signed)
Patient for weekly assessment of radiation to bilateral breast.Skin is pink.Tolerating treatment fine.Continue with application of radiaplex.

## 2013-08-24 ENCOUNTER — Ambulatory Visit
Admission: RE | Admit: 2013-08-24 | Discharge: 2013-08-24 | Disposition: A | Discharge status: Home / Self Care | Payer: Medicare Other | Source: Ambulatory Visit | Attending: Radiation Oncology | Admitting: Radiation Oncology

## 2013-08-24 DIAGNOSIS — L589 Radiodermatitis, unspecified: Secondary | ICD-10-CM | POA: Diagnosis not present

## 2013-08-24 DIAGNOSIS — C50419 Malignant neoplasm of upper-outer quadrant of unspecified female breast: Secondary | ICD-10-CM | POA: Diagnosis not present

## 2013-08-24 DIAGNOSIS — Z510 Encounter for antineoplastic radiation therapy: Secondary | ICD-10-CM | POA: Diagnosis not present

## 2013-08-25 ENCOUNTER — Ambulatory Visit
Admission: RE | Admit: 2013-08-25 | Discharge: 2013-08-25 | Disposition: A | Discharge status: Home / Self Care | Payer: Medicare Other | Source: Ambulatory Visit | Attending: Radiation Oncology | Admitting: Radiation Oncology

## 2013-08-25 DIAGNOSIS — Z510 Encounter for antineoplastic radiation therapy: Secondary | ICD-10-CM | POA: Diagnosis not present

## 2013-08-25 DIAGNOSIS — C50419 Malignant neoplasm of upper-outer quadrant of unspecified female breast: Secondary | ICD-10-CM | POA: Diagnosis not present

## 2013-08-25 DIAGNOSIS — L589 Radiodermatitis, unspecified: Secondary | ICD-10-CM | POA: Diagnosis not present

## 2013-08-26 ENCOUNTER — Ambulatory Visit
Admission: RE | Admit: 2013-08-26 | Discharge: 2013-08-26 | Disposition: A | Discharge status: Home / Self Care | Payer: Medicare Other | Source: Ambulatory Visit | Attending: Radiation Oncology | Admitting: Radiation Oncology

## 2013-08-26 ENCOUNTER — Telehealth: Payer: Self-pay | Admitting: *Deleted

## 2013-08-26 DIAGNOSIS — C50419 Malignant neoplasm of upper-outer quadrant of unspecified female breast: Secondary | ICD-10-CM | POA: Diagnosis not present

## 2013-08-26 DIAGNOSIS — Z510 Encounter for antineoplastic radiation therapy: Secondary | ICD-10-CM | POA: Diagnosis not present

## 2013-08-26 DIAGNOSIS — L589 Radiodermatitis, unspecified: Secondary | ICD-10-CM | POA: Diagnosis not present

## 2013-08-26 NOTE — Telephone Encounter (Signed)
Mailed after appt letter to pt. 

## 2013-08-29 ENCOUNTER — Encounter: Payer: Self-pay | Admitting: Radiation Oncology

## 2013-08-29 ENCOUNTER — Ambulatory Visit
Admission: RE | Admit: 2013-08-29 | Discharge: 2013-08-29 | Disposition: A | Discharge status: Home / Self Care | Payer: Medicare Other | Source: Ambulatory Visit | Attending: Radiation Oncology | Admitting: Radiation Oncology

## 2013-08-29 ENCOUNTER — Encounter (INDEPENDENT_AMBULATORY_CARE_PROVIDER_SITE_OTHER): Payer: Self-pay

## 2013-08-29 VITALS — BP 139/27 | Temp 98.6°F | Ht 61.0 in | Wt 140.1 lb

## 2013-08-29 DIAGNOSIS — L589 Radiodermatitis, unspecified: Secondary | ICD-10-CM | POA: Diagnosis not present

## 2013-08-29 DIAGNOSIS — C50411 Malignant neoplasm of upper-outer quadrant of right female breast: Secondary | ICD-10-CM

## 2013-08-29 DIAGNOSIS — C50419 Malignant neoplasm of upper-outer quadrant of unspecified female breast: Secondary | ICD-10-CM | POA: Diagnosis not present

## 2013-08-29 DIAGNOSIS — Z510 Encounter for antineoplastic radiation therapy: Secondary | ICD-10-CM | POA: Diagnosis not present

## 2013-08-29 NOTE — Progress Notes (Signed)
  Radiation Oncology         [phone] ________________________________  Name: CARTIER MAPEL MRN: [id number]  Date: 08/29/2013  DOB: 08-30-1932  Weekly Radiation Therapy Management  Current Dose: 42.72 Gy     Planned Dose:  42.72 Gy  Narrative . . . . . . . . The patient presents for routine under treatment assessment.                                   The patient is without complaint except for some mild fatigue as well as itching in the breast area. She is happy to complete her radiation therapy today.                                 Set-up films were reviewed.                                 The chart was checked. Physical Findings. . .  height is 5\' 1"  (1.549 m) and weight is 140 lb 1.6 oz (63.549 kg). Her temperature is 98.6 F (37 C). Her blood pressure is 139/27. . Weight essentially stable.  The breast areas show erythema throughout both breasts. There is radiation dermatitis in the upper-inner aspect of both breasts but no moist desquamation Impression . . . . . . . The patient is tolerating radiation. Plan . . . . . . . . . . . Marland Kitchen routine followup in one month. Patient was given Biafine today for her skin.  ________________________________   Blair Promise, PhD, MD

## 2013-08-30 ENCOUNTER — Ambulatory Visit (INDEPENDENT_AMBULATORY_CARE_PROVIDER_SITE_OTHER): Payer: Medicare Other | Admitting: *Deleted

## 2013-08-30 VITALS — BP 142/78

## 2013-08-30 DIAGNOSIS — I10 Essential (primary) hypertension: Secondary | ICD-10-CM | POA: Diagnosis not present

## 2013-08-30 NOTE — Progress Notes (Signed)
° °  Subjective:    Patient ID: Cynthia Garza, female    DOB: [birth year], 80 y.o.   MRN: [id number]  HPI  PT here for nurse visit of BP check results were improving from last check advised to keep next appointment with PCP.     Review of Systems     Objective:   Physical Exam        Assessment & Plan:

## 2013-09-01 NOTE — Progress Notes (Signed)
  Radiation Oncology         [phone] ________________________________  Name: Cynthia Garza MRN: [id number]  Date: 08/29/2013  DOB: [birth year]  End of Treatment Note  Diagnosis:   Bilateral breast cancer (T1N0 on the Left and T16micN0 on the right)     Indication for treatment:  Curative       Radiation treatment dates:   08/08/2013-08/29/2013  Site/dose:   Bilateral breasts/ 42.72 Gy at 2.67 Gy per fraction x 16 fractions  Beams/energy:   Opposed tangents with 6MV photons.  Narrative: The patient tolerated radiation treatment relatively well.   She had the expected skin changes which were manageable. She also had mild fatigue.   Plan: The patient has completed radiation treatment. The patient will return to radiation oncology clinic for routine followup in one month. I advised them to call or return sooner if they have any questions or concerns related to their recovery or treatment.  ------------------------------------------------  Thea Silversmith, MD

## 2013-09-09 NOTE — Progress Notes (Signed)
Radiation Oncology         [phone] ________________________________  Name: Cynthia Garza      MRN: [id number]          Date: 07/28/2013              DOB: Mar 01, 1933  Optical Surface Tracking Plan:  Since intensity modulated radiotherapy (IMRT) and 3D conformal radiation treatment methods are predicated on accurate and precise positioning for treatment, intrafraction motion monitoring is medically necessary to ensure accurate and safe treatment delivery.  The ability to quantify intrafraction motion without excessive ionizing radiation dose can only be performed with optical surface tracking. Accordingly, surface imaging offers the opportunity to obtain 3D measurements of patient position throughout IMRT and 3D treatments without excessive radiation exposure.  I am ordering optical surface tracking for this patient's upcoming course of radiotherapy. ________________________________ Signature   Reference:   Ursula Alert, J, et al. Surface imaging-based analysis of intrafraction motion for breast radiotherapy patients.Journal of [place], n. 6, nov. 2014. ISSN [id number].   Available at: <http://www.jacmp.org/index.php/jacmp/article/view/4957>.

## 2013-09-09 NOTE — Addendum Note (Signed)
Encounter addended by: Thea Silversmith, MD on: 09/09/2013 11:30 AM<BR>     Documentation filed: Notes Section

## 2013-09-15 ENCOUNTER — Encounter: Payer: Self-pay | Admitting: Internal Medicine

## 2013-09-15 ENCOUNTER — Ambulatory Visit (INDEPENDENT_AMBULATORY_CARE_PROVIDER_SITE_OTHER): Payer: Medicare Other | Admitting: Internal Medicine

## 2013-09-15 VITALS — BP 140/80 | HR 76 | Temp 98.0°F | Resp 16 | Wt 137.0 lb

## 2013-09-15 DIAGNOSIS — F329 Major depressive disorder, single episode, unspecified: Secondary | ICD-10-CM

## 2013-09-15 DIAGNOSIS — F3289 Other specified depressive episodes: Secondary | ICD-10-CM

## 2013-09-15 DIAGNOSIS — I10 Essential (primary) hypertension: Secondary | ICD-10-CM

## 2013-09-15 MED ORDER — LOSARTAN POTASSIUM-HCTZ 100-25 MG PO TABS: 1.0000 | ORAL_TABLET | Freq: Every day | ORAL | Status: DC

## 2013-09-15 MED ORDER — AMLODIPINE BESYLATE 5 MG PO TABS: 5.0000 mg | ORAL_TABLET | Freq: Every day | ORAL | Status: DC

## 2013-09-15 NOTE — Progress Notes (Signed)
Pre visit review using our clinic review tool, if applicable. No additional management support is needed unless otherwise documented below in the visit note. 

## 2013-09-15 NOTE — Progress Notes (Signed)
    Subjective:    HPI  C/o elev BP lately -- BP was checked on legs  She had a breast ca surgery on 07/07/13. Just started XRT  F/u HTN, insomnia, fatigue, UTI  Sleeping better  Ureter bx was ok  BP Readings from Last 3 Encounters:  09/15/13 140/80  08/30/13 142/78  08/29/13 139/27   Wt Readings from Last 3 Encounters:  09/15/13 137 lb (62.143 kg)  08/29/13 140 lb 1.6 oz (63.549 kg)  08/23/13 138 lb 14.4 oz (63.005 kg)      Past Medical History  Diagnosis Date  . Hemangioma of intra-abdominal structures   . Renal cyst   . Shingles   . Paresthesia   . Heart murmur   . Dyslipidemia   . GERD (gastroesophageal reflux disease)   . Osteoporosis   . Insomnia   . Depression   . HTN (hypertension)   . LBP (low back pain)   . Anxiety   . Breast cancer 2015    ER+/PR+/Her2-    Review of Systems  Constitutional: Positive for activity change. Negative for unexpected weight change.  Cardiovascular: Negative for chest pain.  Gastrointestinal: Positive for nausea. Negative for abdominal pain and constipation.  Genitourinary: Negative for dysuria, frequency, flank pain and difficulty urinating.  Musculoskeletal: Negative for back pain, gait problem, myalgias and neck pain.  Psychiatric/Behavioral: Positive for sleep disturbance. The patient is nervous/anxious.        Objective:   Physical Exam  Constitutional: She appears well-nourished.  Skin: No rash noted.   BP 140/80  Pulse 76  Temp(Src) 98 F (36.7 C) (Oral)  Resp 16  Wt 137 lb (62.143 kg)  Constitutional: She appears fatigued but less ill than last week, well-developed and well-nourished. No acute distress. Spouse at side Neck: Normal range of motion. Neck supple. No JVD or LAD present. No thyromegaly present.  Cardiovascular: fast rate but regular rhythm, normal heart sounds.  No murmur heard. No BLE edema. Pulmonary/Chest: Effort normal and breath sounds normal. No respiratory distress. She has no  wheezes.  Abdominal: Soft. Bowel sounds are present. She exhibits no distension. There is no tenderness, rebound or gaurding. no masses Neurological: She is alert and oriented to person, place, and time. No cranial nerve deficit. Coordination normal.  Skin: Skin is warm and dry. No rash noted. No erythema.  Psychiatric: pt seem slightly anxious and despondent, but smiles at jokes. her behavior is normal. Judgment and thought content normal.  Scars on B breasts and in B axillas - no lymphedema  Lab Results  Component Value Date   WBC 9.9 06/30/2013   HGB 14.3 06/30/2013   HCT 40.7 06/30/2013   PLT 222 06/30/2013   GLUCOSE 94 06/30/2013   CHOL 240* 02/09/2012   TRIG 151.0* 02/09/2012   HDL 68.90 02/09/2012   LDLDIRECT 144.7 02/09/2012   LDLCALC 96 04/26/2007   ALT 9 06/08/2013   AST 13 06/08/2013   NA 143 06/30/2013   K 4.0 06/30/2013   CL 103 06/30/2013   CREATININE 0.87 06/30/2013   BUN 18 06/30/2013   CO2 27 06/30/2013   TSH 1.29 02/09/2012   INR 0.95 01/15/2012   HGBA1C 5.7 01/02/2011      Assessment & Plan:

## 2013-09-19 ENCOUNTER — Telehealth: Payer: Self-pay | Admitting: *Deleted

## 2013-09-19 NOTE — Telephone Encounter (Signed)
Received call from patient stating she finished her radiation 08/29/13 and her appointment with Dr. Humphrey Rolls is 10/10/13 and was wandering if she could be seen sooner.  Informed her that Dr. Humphrey Rolls is on an unexpected leave of absence and that Dr. Jana Hakim could see her.  Confirmed appointment for 09/22/13 at 2pm with Dr. Jana Hakim

## 2013-09-22 ENCOUNTER — Telehealth: Payer: Self-pay | Admitting: Oncology

## 2013-09-22 ENCOUNTER — Ambulatory Visit (HOSPITAL_BASED_OUTPATIENT_CLINIC_OR_DEPARTMENT_OTHER): Payer: Medicare Other | Admitting: Oncology

## 2013-09-22 VITALS — BP 154/83 | HR 81 | Temp 98.6°F | Resp 20 | Ht 61.0 in | Wt 138.3 lb

## 2013-09-22 DIAGNOSIS — C50411 Malignant neoplasm of upper-outer quadrant of right female breast: Secondary | ICD-10-CM

## 2013-09-22 DIAGNOSIS — C50919 Malignant neoplasm of unspecified site of unspecified female breast: Secondary | ICD-10-CM | POA: Diagnosis not present

## 2013-09-22 DIAGNOSIS — Z79811 Long term (current) use of aromatase inhibitors: Secondary | ICD-10-CM

## 2013-09-22 DIAGNOSIS — Z170 Estrogen receptor positive status [ER+]: Secondary | ICD-10-CM

## 2013-09-22 DIAGNOSIS — C50319 Malignant neoplasm of lower-inner quadrant of unspecified female breast: Secondary | ICD-10-CM

## 2013-09-22 NOTE — Progress Notes (Signed)
[facility]  [phone] [phone]     ID: Cynthia Garza OB: 1933/02/09  MR#: [id number]  FVC#:[id number]  PCP: Walker Kehr, MD GYN:   SU: Autumn Messing OTHER MD: Thea Silversmith  CHIEF COMPLAINT:  BREAST CANCER HISTORY: From Dr.Khan's 06/08/2013 note:  "1. [The patient] underwent a six-month followup mammogram for left-sided abnormalities. On her mammogram she was noted to have a right breast mass at the 12:00 position. By ultrasound it measured 1.5 cm. Patient went on to have a biopsy performed of this mass. This revealed intermediate grade invasive ductal carcinoma ER positive PR positive HER-2/neu negative with a proliferation marker Ki-67 22%. She had MRI of the breasts performed. The MRI showed a 1.3 cm enhancement known for the by biopsy with 2 small satellite lesion. She was also noted to have a another linear enhancement as well in the right breast. On the left side she was noted to have a 7 mm mass by ultrasound this measured 8 mm this was at the 7:00 position. Patient had a biopsy of this performed. By pathology report report this is consistent with an invasive cancer.   2. status post lumpectomy performed on 07/07/2013 of the left breast. The final pathology revealed a 0.7 cm invasive ductal carcinoma with ductal carcinoma in situ. Sentinel lymph node was negative for metastatic disease. Tumor was ER positive PR positive HER-2/neu negative with a proliferation marker Ki-67 13%. Stage I (T1 N0)  #3 patient had Oncotype DX testing performed her breast cancer recurrence score was 0 giving her a 3% risk of distant recurrence with tamoxifen for 5 years. Patient will not need chemotherapy but will need antiestrogen therapy"  Her subsequent history is as detailed below.  INTERVAL HISTORY: Cynthia Garza returns today for followup of her breast cancer accompanied by her husband Cynthia Garza. Since her last visit here she completed her radiation treatments and is  now ready to start systemic therapy with anti-estrogens. She is establishing herself in my service today.  REVIEW OF SYSTEMS: Cynthia Garza tolerated the radiation "very well". After completing the radiation she felt actually more fatigued than before. She had some erythema particularly on the right side. That is resolving. She complains of some soreness and some "lumps" in both breasts, particularly the right breast. Aside from that she has occasional problems with her vision, and thinks she needs to have her ophthalmologist see her again. Otherwise a detailed review of systems was noncontributory  PAST MEDICAL HISTORY: Past Medical History  Diagnosis Date  . Hemangioma of intra-abdominal structures   . Renal cyst   . Shingles   . Paresthesia   . Heart murmur   . Dyslipidemia   . GERD (gastroesophageal reflux disease)   . Osteoporosis   . Insomnia   . Depression   . HTN (hypertension)   . LBP (low back pain)   . Anxiety   . Breast cancer 2015    ER+/PR+/Her2-    PAST SURGICAL HISTORY: Past Surgical History  Procedure Laterality Date  . Abdominal hysterectomy      ?1970's  . Breast lumpectomy with needle localization and axillary sentinel lymph node bx Bilateral 07/07/2013    Procedure: BREAST LUMPECTOMY WITH NEEDLE LOCALIZATION AND AXILLARY SENTINEL LYMPH NODE BIOPIES;  Surgeon: Merrie Roof, MD;  Location: [facility];  Service: General;  Laterality: Bilateral;    FAMILY HISTORY Family History  Problem Relation Age of Onset  . Kidney failure Mother   . Hypertension Father   . Esophageal  cancer Father   . Coronary artery disease Neg Hx     GYNECOLOGIC HISTORY:  Menarche age 75, first live birth age 25, she is [place] P2. She underwent menopause in the late 1970s. She did not take hormone replacement.  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 Her husband Cynthia Garza used to work in Actuary. The patient's daughter  Cynthia Garza used to be a Education officer, museum but now works in Engineer, mining as. Daughter Cynthia Garza is a homemaker. The patient has 4 grandchildren. She attends a local [place]: [employer]   HEALTH MAINTENANCE: History  Substance Use Topics  . Smoking status: Never Smoker   . Smokeless tobacco: Never Used  . Alcohol Use: No     Colonoscopy:  PAP:  Bone density:  Lipid panel:  Allergies  Allergen Reactions  . Simvastatin Other (See Comments)    aches  . Diflucan [Fluconazole] Rash    Current Outpatient Prescriptions  Medication Sig Dispense Refill  . amLODipine (NORVASC) 5 MG tablet Take 1 tablet (5 mg total) by mouth daily.  90 tablet  3  . anastrozole (ARIMIDEX) 1 MG tablet Take 1 tablet (1 mg total) by mouth daily.  90 tablet  3  . aspirin 81 MG tablet Take 81 mg by mouth daily.      Marland Kitchen buPROPion (WELLBUTRIN XL) 150 MG 24 hr tablet Take 150 mg by mouth daily.      . Carboxymethylcellul-Glycerin (OPTIVE) 0.5-0.9 % SOLN Place 1-2 drops into both eyes at bedtime. For dry eyes      . hyaluronate sodium (RADIAPLEXRX) GEL Apply 1 application topically 2 (two) times daily. Apply after rad tx and bedtime and on weekends      . losartan-hydrochlorothiazide (HYZAAR) 100-25 MG per tablet Take 1 tablet by mouth daily.  90 tablet  3  . meclizine (ANTIVERT) 12.5 MG tablet Take 12.5 mg by mouth 3 (three) times daily as needed for dizziness.      . Multiple Vitamins-Minerals (ALIVE ONCE DAILY WOMENS 50+) TABS Take 1 tablet by mouth daily.      . non-metallic deodorant Jethro Poling) MISC Apply 1 application topically daily.      . ranitidine (ZANTAC) 150 MG tablet Take 150 mg by mouth 2 (two) times daily.      . vitamin B-12 (CYANOCOBALAMIN) 1000 MCG tablet Take 1,000 mcg by mouth daily.      Marland Kitchen zolpidem (AMBIEN) 10 MG tablet Take 1 tablet (10 mg total) by mouth at bedtime as needed for sleep.  90 tablet  1   Current Facility-Administered Medications  Medication Dose Route  Frequency Provider Last Rate Last Dose  . methylPREDNISolone acetate (DEPO-MEDROL) injection 80 mg  80 mg Intra-articular Once Cassandria Anger, MD        OBJECTIVE:  Filed Vitals:   09/22/13 1408  BP: 154/83  Pulse: 81  Temp: 98.6 F (37 C)  Resp: 20     Body mass index is 26.14 kg/(m^2).    ECOG FS:1 - Symptomatic but completely ambulatory  Ocular: Sclerae unicteric, pupils round and equal Ear-nose-throat: Oropharynx clear and moist Lymphatic: No cervical or supraclavicular adenopathy Lungs no rales or rhonchi, good excursion bilaterally Heart regular rate and rhythm, no murmur appreciated Abd soft, nontender, positive bowel sounds MSK no focal spinal tenderness, no joint edema Neuro: non-focal, well-oriented, appropriate affect Breasts: Status post bilateral lumpectomies and bilateral radiation. There is minimal  erythema on the right, no desquamation. I do not palpate any masses or nipple changes of concern in either breast. Both axillae are benign.   LAB RESULTS:  CMP     Component Value Date/Time   NA 143 06/30/2013 1421   NA 141 06/08/2013 0820   K 4.0 06/30/2013 1421   K 3.5 06/08/2013 0820   CL 103 06/30/2013 1421   CO2 27 06/30/2013 1421   CO2 24 06/08/2013 0820   GLUCOSE 94 06/30/2013 1421   GLUCOSE 97 06/08/2013 0820   BUN 18 06/30/2013 1421   BUN 19.6 06/08/2013 0820   CREATININE 0.87 06/30/2013 1421   CREATININE 0.8 06/08/2013 0820   CALCIUM 9.3 06/30/2013 1421   CALCIUM 9.4 06/08/2013 0820   PROT 6.7 06/08/2013 0820   PROT 7.4 02/09/2012 1621   ALBUMIN 3.8 06/08/2013 0820   ALBUMIN 4.4 02/09/2012 1621   AST 13 06/08/2013 0820   AST 18 02/09/2012 1621   ALT 9 06/08/2013 0820   ALT 11 02/09/2012 1621   ALKPHOS 61 06/08/2013 0820   ALKPHOS 51 02/09/2012 1621   BILITOT 0.58 06/08/2013 0820   BILITOT 0.9 02/09/2012 1621   GFRNONAA 62* 06/30/2013 1421   GFRAA 72* 06/30/2013 1421    I No results found for this basename: SPEP, UPEP,  kappa and lambda light chains    Lab Results    Component Value Date   WBC 9.9 06/30/2013   NEUTROABS 5.1 06/08/2013   HGB 14.3 06/30/2013   HCT 40.7 06/30/2013   MCV 83.9 06/30/2013   PLT 222 06/30/2013      Chemistry      Component Value Date/Time   NA 143 06/30/2013 1421   NA 141 06/08/2013 0820   K 4.0 06/30/2013 1421   K 3.5 06/08/2013 0820   CL 103 06/30/2013 1421   CO2 27 06/30/2013 1421   CO2 24 06/08/2013 0820   BUN 18 06/30/2013 1421   BUN 19.6 06/08/2013 0820   CREATININE 0.87 06/30/2013 1421   CREATININE 0.8 06/08/2013 0820      Component Value Date/Time   CALCIUM 9.3 06/30/2013 1421   CALCIUM 9.4 06/08/2013 0820   ALKPHOS 61 06/08/2013 0820   ALKPHOS 51 02/09/2012 1621   AST 13 06/08/2013 0820   AST 18 02/09/2012 1621   ALT 9 06/08/2013 0820   ALT 11 02/09/2012 1621   BILITOT 0.58 06/08/2013 0820   BILITOT 0.9 02/09/2012 1621       No results found for this basename: LABCA2    No components found with this basename: LABCA125    No results found for this basename: INR,  in the last 168 hours  Urinalysis    Component Value Date/Time   COLORURINE LT. YELLOW 02/09/2012 [place] 02/09/2012 1621   LABSPEC <=1.005 02/09/2012 1621   PHURINE 6.5 02/09/2012 1621   GLUCOSEU NEGATIVE 02/09/2012 1621   GLUCOSEU NEGATIVE 12/04/2011 1640   HGBUR NEGATIVE 02/09/2012 1621   BILIRUBINUR NEGATIVE 02/09/2012 1621   BILIRUBINUR neg 12/03/2011 1540   KETONESUR NEGATIVE 02/09/2012 1621   PROTEINUR 100* 12/04/2011 1640   PROTEINUR positive 12/03/2011 1540   UROBILINOGEN 0.2 02/09/2012 1621   UROBILINOGEN 0.2 12/03/2011 1540   NITRITE NEGATIVE 02/09/2012 1621   NITRITE neg 12/03/2011 1540   LEUKOCYTESUR TRACE 02/09/2012 1621    STUDIES: No results found.  ASSESSMENT: 80 y.o. [place] woman with bilateral breast cancers  (1) RIGHT BREAST: Status post right lumpectomy and right axillary lymph node sampling 07/07/2013  for a pT1c pN0, stage IA invasive ductal carcinoma, grade 2, estrogen receptor 100% positive, progesterone receptor 97%  positive, with an MIB-1 of 22% and no HER-2 amplification  (a) Oncotype DX score of 0 predicts an outside the breast recurrence of 3% within 10 years if the patient's only systemic treatment is tamoxifen for 5 years  (b) adjuvant radiation completed 08/29/2013  (2) LEFT BREAST: Status post left lumpectomy 07/07/2013 for a pT1b pN0, stage IA invasive ductal carcinoma, grade 2, estrogen receptor 100% positive, progesterone receptor 54% positive, with an MIB-1 of 13% and no HER-2 amplification  (b) Completed adjuvant radiation 08/29/2013  (3) SYSTEMIC THERAPY:   (a) started on anastrozole 09/23/2013  PLAN: We spent the better part of today's hour-long appointment discussing the biology of breast cancer in general, and the specifics of the patient's tumor in particular. She has had excellent local treatment, and is now ready to start systemic therapy. Given her extremely low Oncotype score, chemotherapy is not in the picture.  Accordingly today we discussed anti-estrogens in detail. She understands the difference between tamoxifen and the aromatase inhibitors. She also understands the difference between tamoxifen for 5 years, which all the women on the [place] took, and tamoxifen for 10 years, which we now no further reduces the risk of recurrence by an additional 3% or so.   She understands that antiestrogen is also reduce the risk of a new breast cancer developing, which is in general approximately 1% per year. That risk is cut in half by these agents.  With a good understanding of the possible toxicities, side effects, and complications of these agents, she decided she would like to start anastrozole. The plan will be to take this for 5 years, assuming she tolerates it well. We did discuss cost issues, since the priceof this agent can be very variable. She will let us know if there are any difficulties there.  Otherwise she will see me again in 3 months. If she is tolerating the  anastrozole well we will obtain a baseline bone density and discuss osteoporosis prevention.  Cynthia Garza has a good understanding of the overall plan. She agrees with it. She has a goal of treatment in her case is cure. She will call with any problems that may develop before her next visit here.   Chauncey Cruel, MD   09/24/2013 10:37 AM

## 2013-09-23 ENCOUNTER — Encounter (INDEPENDENT_AMBULATORY_CARE_PROVIDER_SITE_OTHER): Payer: Self-pay | Admitting: General Surgery

## 2013-09-23 ENCOUNTER — Other Ambulatory Visit: Payer: Self-pay | Admitting: *Deleted

## 2013-09-23 DIAGNOSIS — C50411 Malignant neoplasm of upper-outer quadrant of right female breast: Secondary | ICD-10-CM

## 2013-09-23 MED ORDER — ANASTROZOLE 1 MG PO TABS: 1.0000 mg | ORAL_TABLET | Freq: Every day | ORAL | Status: DC

## 2013-10-02 NOTE — Assessment & Plan Note (Signed)
Continue with current prescription therapy as reflected on the Med list.  

## 2013-10-05 DIAGNOSIS — H25099 Other age-related incipient cataract, unspecified eye: Secondary | ICD-10-CM | POA: Diagnosis not present

## 2013-10-07 ENCOUNTER — Ambulatory Visit
Admission: RE | Admit: 2013-10-07 | Discharge: 2013-10-07 | Disposition: A | Discharge status: Home / Self Care | Payer: Medicare Other | Source: Ambulatory Visit | Attending: Radiation Oncology | Admitting: Radiation Oncology

## 2013-10-07 VITALS — BP 150/72 | HR 82 | Temp 98.2°F | Wt 139.5 lb

## 2013-10-07 DIAGNOSIS — C50411 Malignant neoplasm of upper-outer quadrant of right female breast: Secondary | ICD-10-CM

## 2013-10-07 NOTE — Progress Notes (Signed)
One month follow up completion of radiation to bilateral breast.Skin healed with mild discoloration.Informed to start using lotion with vitamin e.Started anastrozole on 09/23/13 and follow up with Dr.Magrinat in 3 months.

## 2013-10-10 ENCOUNTER — Ambulatory Visit: Payer: Medicare Other | Admitting: Oncology

## 2013-10-11 NOTE — Progress Notes (Signed)
   Department of Radiation Oncology  Phone:  [phone] Fax:        [phone]   Name: SAHORY NORDLING MRN: [id number]  DOB: [birth year]  Date: 10/07/2013  Follow Up Visit Note  Diagnosis: Bilateral breast cancer  Summary and Interval since last radiation: 1 month (complted   Interval History: Cynthia Garza presents today for routine followup.  She si doing well and is pleased with how her skin has healed. She has started Femara and is tolerating that well.   Allergies:  Allergies  Allergen Reactions  . Simvastatin Other (See Comments)    aches  . Diflucan [Fluconazole] Rash    Medications:  Current Outpatient Prescriptions  Medication Sig Dispense Refill  . amLODipine (NORVASC) 5 MG tablet Take 1 tablet (5 mg total) by mouth daily.  90 tablet  3  . anastrozole (ARIMIDEX) 1 MG tablet Take 1 tablet (1 mg total) by mouth daily.  90 tablet  3  . aspirin 81 MG tablet Take 81 mg by mouth daily.      Marland Kitchen buPROPion (WELLBUTRIN XL) 150 MG 24 hr tablet Take 150 mg by mouth daily.      . Carboxymethylcellul-Glycerin (OPTIVE) 0.5-0.9 % SOLN Place 1-2 drops into both eyes at bedtime. For dry eyes      . Multiple Vitamins-Minerals (ALIVE ONCE DAILY WOMENS 50+) TABS Take 1 tablet by mouth daily.      . ranitidine (ZANTAC) 150 MG tablet Take 150 mg by mouth 2 (two) times daily.      . vitamin B-12 (CYANOCOBALAMIN) 1000 MCG tablet Take 1,000 mcg by mouth daily.      Marland Kitchen zolpidem (AMBIEN) 10 MG tablet Take 1 tablet (10 mg total) by mouth at bedtime as needed for sleep.  90 tablet  1  . hyaluronate sodium (RADIAPLEXRX) GEL Apply 1 application topically 2 (two) times daily. Apply after rad tx and bedtime and on weekends      . losartan-hydrochlorothiazide (HYZAAR) 100-25 MG per tablet Take 1 tablet by mouth daily.  90 tablet  3   Current Facility-Administered Medications  Medication Dose Route Frequency Provider Last Rate Last Dose  . methylPREDNISolone acetate (DEPO-MEDROL) injection 80 mg  80  mg Intra-articular Once Cassandria Anger, MD        Physical Exam:  Filed Vitals:   10/07/13 1512  BP: 150/72  Pulse: 82  Temp: 98.2 F (36.8 C)  Weight: 139 lb 8 oz (63.277 kg)  SpO2: 96%   Excellent cosmetic result. No hyperpigmentation.   IMPRESSION: Cynthia Garza is a 80 y.o. female s/p bilateral breast conservation with resolving acute effects of treatment  PLAN:  Follow up with surgery and medical oncology. Discussed sun protection in the treated area. Discussed need for yearly mammograms for follow up. See me prn.     Thea Silversmith, MD

## 2013-10-18 DIAGNOSIS — D4959 Neoplasm of unspecified behavior of other genitourinary organ: Secondary | ICD-10-CM | POA: Diagnosis not present

## 2013-11-04 ENCOUNTER — Ambulatory Visit (INDEPENDENT_AMBULATORY_CARE_PROVIDER_SITE_OTHER): Payer: Medicare Other | Admitting: General Surgery

## 2013-11-04 ENCOUNTER — Encounter (INDEPENDENT_AMBULATORY_CARE_PROVIDER_SITE_OTHER): Payer: Self-pay | Admitting: General Surgery

## 2013-11-04 VITALS — BP 126/80 | HR 76 | Temp 97.2°F | Ht 61.0 in | Wt 134.0 lb

## 2013-11-04 DIAGNOSIS — C50411 Malignant neoplasm of upper-outer quadrant of right female breast: Secondary | ICD-10-CM

## 2013-11-04 DIAGNOSIS — C50419 Malignant neoplasm of upper-outer quadrant of unspecified female breast: Secondary | ICD-10-CM

## 2013-11-04 NOTE — Patient Instructions (Signed)
Continue arimidex Continue regular self exams

## 2013-11-04 NOTE — Progress Notes (Signed)
Subjective:     Patient ID: Cynthia Garza, female   DOB: 07/13/1932, 80 y.o.   MRN: [id number]  HPI The patient is a 80 year old white female who is 4 months status post bilateral lumpectomies and negative sentinel node biopsies for a right sided T1 C. N0 breast cancer and a left-sided T1 B. N0 breast cancer. She has done well and has no complaints today. She has some occasional soreness in both breasts. She is taking anastrozole and seems to be tolerating it well.  Review of Systems  Constitutional: Negative.   HENT: Negative.   Eyes: Negative.   Respiratory: Negative.   Cardiovascular: Negative.   Gastrointestinal: Negative.   Endocrine: Negative.   Genitourinary: Negative.   Musculoskeletal: Negative.   Skin: Negative.   Allergic/Immunologic: Negative.   Neurological: Negative.   Hematological: Negative.   Psychiatric/Behavioral: Negative.        Objective:   Physical Exam  Constitutional: She is oriented to person, place, and time. She appears well-developed and well-nourished.  HENT:  Head: Normocephalic and atraumatic.  Eyes: Conjunctivae and EOM are normal. Pupils are equal, round, and reactive to light.  Neck: Normal range of motion. Neck supple.  Cardiovascular: Normal rate, regular rhythm and normal heart sounds.   Pulmonary/Chest: Effort normal and breath sounds normal.  In the right breast there are 4 palpable nodules around the area all of that corresponded to the sites of her nuclear medicine injection. In the left breast there is a firm palpable scar beneath the incision in the 7:00 position. There is no palpable axillary, supraclavicular, or cervical lymphadenopathy. Her incisions are all healing nicely.  Abdominal: Soft. Bowel sounds are normal.  Musculoskeletal: Normal range of motion.  Lymphadenopathy:    She has no cervical adenopathy.  Neurological: She is alert and oriented to person, place, and time.  Skin: Skin is warm and dry.  Psychiatric: She has  a normal mood and affect. Her behavior is normal.       Assessment:     The patient is 4 months status post bilateral lumpectomies for bilateral breast cancer     Plan:     At this point she will continue to do regular self exams. She will continue to take anastrozole. I will plan to see her back in 3 months.

## 2013-11-16 ENCOUNTER — Other Ambulatory Visit: Payer: Self-pay | Admitting: Physician Assistant

## 2013-11-29 ENCOUNTER — Other Ambulatory Visit: Payer: Self-pay

## 2013-11-29 ENCOUNTER — Other Ambulatory Visit (HOSPITAL_BASED_OUTPATIENT_CLINIC_OR_DEPARTMENT_OTHER): Payer: Medicare Other

## 2013-11-29 DIAGNOSIS — C50419 Malignant neoplasm of upper-outer quadrant of unspecified female breast: Secondary | ICD-10-CM | POA: Diagnosis not present

## 2013-11-29 DIAGNOSIS — M810 Age-related osteoporosis without current pathological fracture: Secondary | ICD-10-CM | POA: Diagnosis not present

## 2013-11-29 DIAGNOSIS — C50411 Malignant neoplasm of upper-outer quadrant of right female breast: Secondary | ICD-10-CM

## 2013-11-29 LAB — COMPREHENSIVE METABOLIC PANEL (CC13)
ALT: 11 U/L (ref 0–55)
ANION GAP: 11 meq/L (ref 3–11)
AST: 15 U/L (ref 5–34)
Albumin: 3.8 g/dL (ref 3.5–5.0)
Alkaline Phosphatase: 65 U/L (ref 40–150)
BILIRUBIN TOTAL: 0.52 mg/dL (ref 0.20–1.20)
BUN: 18.3 mg/dL (ref 7.0–26.0)
CO2: 24 meq/L (ref 22–29)
CREATININE: 0.8 mg/dL (ref 0.6–1.1)
Calcium: 9.6 mg/dL (ref 8.4–10.4)
Chloride: 105 mEq/L (ref 98–109)
Glucose: 111 mg/dl (ref 70–140)
Potassium: 3.8 mEq/L (ref 3.5–5.1)
SODIUM: 140 meq/L (ref 136–145)
Total Protein: 6.8 g/dL (ref 6.4–8.3)

## 2013-11-29 LAB — CBC WITH DIFFERENTIAL/PLATELET
BASO%: 1.2 % (ref 0.0–2.0)
Basophils Absolute: 0.1 10*3/uL (ref 0.0–0.1)
EOS%: 5 % (ref 0.0–7.0)
Eosinophils Absolute: 0.4 10*3/uL (ref 0.0–0.5)
HCT: 40.9 % (ref 34.8–46.6)
HGB: 13.7 g/dL (ref 11.6–15.9)
LYMPH%: 21.8 % (ref 14.0–49.7)
MCH: 28.3 pg (ref 25.1–34.0)
MCHC: 33.5 g/dL (ref 31.5–36.0)
MCV: 84.5 fL (ref 79.5–101.0)
MONO#: 1 10*3/uL — AB (ref 0.1–0.9)
MONO%: 13.9 % (ref 0.0–14.0)
NEUT#: 4.2 10*3/uL (ref 1.5–6.5)
NEUT%: 58.1 % (ref 38.4–76.8)
Platelets: 209 10*3/uL (ref 145–400)
RBC: 4.84 10*6/uL (ref 3.70–5.45)
RDW: 13.7 % (ref 11.2–14.5)
WBC: 7.3 10*3/uL (ref 3.9–10.3)
lymph#: 1.6 10*3/uL (ref 0.9–3.3)

## 2013-11-30 LAB — VITAMIN D 25 HYDROXY (VIT D DEFICIENCY, FRACTURES): Vit D, 25-Hydroxy: 40 ng/mL (ref 30–89)

## 2013-12-06 ENCOUNTER — Ambulatory Visit (HOSPITAL_BASED_OUTPATIENT_CLINIC_OR_DEPARTMENT_OTHER): Payer: Medicare Other | Admitting: Oncology

## 2013-12-06 ENCOUNTER — Telehealth: Payer: Self-pay | Admitting: Oncology

## 2013-12-06 VITALS — BP 157/67 | HR 80 | Temp 98.2°F | Resp 20 | Ht 61.0 in | Wt 137.3 lb

## 2013-12-06 DIAGNOSIS — C50419 Malignant neoplasm of upper-outer quadrant of unspecified female breast: Secondary | ICD-10-CM | POA: Diagnosis not present

## 2013-12-06 DIAGNOSIS — Z170 Estrogen receptor positive status [ER+]: Secondary | ICD-10-CM

## 2013-12-06 DIAGNOSIS — C50411 Malignant neoplasm of upper-outer quadrant of right female breast: Secondary | ICD-10-CM

## 2013-12-06 DIAGNOSIS — M8580 Other specified disorders of bone density and structure, unspecified site: Secondary | ICD-10-CM

## 2013-12-06 NOTE — Addendum Note (Signed)
Addended by: Amelia Jo I on: 12/06/2013 12:43 PM   Modules accepted: Orders, Medications

## 2013-12-06 NOTE — Progress Notes (Signed)
Please draw blood from feet and obtain blood pressure from thigh on this patient.

## 2013-12-06 NOTE — Telephone Encounter (Signed)
per pof to sch pt appts-sch mamm & BD-cld [facility] stated cannot sch that far out-pt stated will call and sch herself-gave pt #-gave pt copy of sch

## 2013-12-06 NOTE — Progress Notes (Signed)
Cynthia Garza  [phone] [phone]     ID: Cynthia Garza OB: 05-20-1933  MR#: [id number]  LGX#:[id number]  PCP: Cynthia Kehr, MD GYN:   SU: Cynthia Garza OTHER MD: Cynthia Garza  CHIEF COMPLAINT: estrogen receptor positive breast cancer CURRENT TREATMENT: On antiestrogen therapy  BREAST CANCER HISTORY: From Dr.Khan's 06/08/2013 note:  "1. [The patient] underwent a six-month followup mammogram for left-sided abnormalities. On her mammogram she was noted to have a right breast mass at the 12:00 position. By ultrasound it measured 1.5 cm. Patient went on to have a biopsy performed of this mass. This revealed intermediate grade invasive ductal carcinoma ER positive PR positive HER-2/neu negative with a proliferation marker Ki-67 22%. She had MRI of the breasts performed. The MRI showed a 1.3 cm enhancement known for the by biopsy with 2 small satellite lesion. She was also noted to have a another linear enhancement as well in the right breast. On the left side she was noted to have a 7 mm mass by ultrasound this measured 8 mm this was at the 7:00 position. Patient had a biopsy of this performed. By pathology report report this is consistent with an invasive cancer.   2. status post lumpectomy performed on 07/07/2013 of the left breast. The final pathology revealed a 0.7 cm invasive ductal carcinoma with ductal carcinoma in situ. Sentinel lymph node was negative for metastatic disease. Tumor was ER positive PR positive HER-2/neu negative with a proliferation marker Ki-67 13%. Stage I (T1 N0)  #3 patient had Oncotype DX testing performed her breast cancer recurrence score was 0 giving her a 3% risk of distant recurrence with tamoxifen for 5 years. Patient will not need chemotherapy but will need antiestrogen therapy"  Her subsequent history is as detailed below.  INTERVAL HISTORY: Cynthia Garza returns today for followup of her breast cancer accompanied by her  husband Cynthia Garza. Since her last visit here she started anastrozole. She has been on it now approximately 3 months. She paid a total of $16 for the three-month supply. She is tolerating it generally well, with hot flashes alternating with chills as her main side effect. These do not wake her up at night.  REVIEW OF SYSTEMS: Cynthia Garza has a variety of chronic complaints including mild to moderate fatigue, chronic insomnia which she treats with Ambien, seasonal allergies, ankle swelling, heartburn, back pain, and anxiety. She is very concerned that we put somewhere in her records that she needs to have her blood drawn from her foot. She also needs to have her blood pressure check either at the wrist or on her thigh. We will try to make sure this is implemented at the next visit. Otherwise a detailed review of systems today was noncontributory  PAST MEDICAL HISTORY: Past Medical History  Diagnosis Date  . Hemangioma of intra-abdominal structures   . Renal cyst   . Shingles   . Paresthesia   . Heart murmur   . Dyslipidemia   . GERD (gastroesophageal reflux disease)   . Osteoporosis   . Insomnia   . Depression   . HTN (hypertension)   . LBP (low back pain)   . Anxiety   . Breast cancer 2015    ER+/PR+/Her2-  . Radiation 08/08/13-08/29/13    Bilat.breast 42.72 Gy    PAST SURGICAL HISTORY: Past Surgical History  Procedure Laterality Date  . Abdominal hysterectomy      ?1970's  . Breast lumpectomy with needle localization and axillary sentinel lymph node bx Bilateral  07/07/2013    Procedure: BREAST LUMPECTOMY WITH NEEDLE LOCALIZATION AND AXILLARY SENTINEL LYMPH NODE BIOPIES;  Surgeon: Merrie Roof, MD;  Location: [facility];  Service: General;  Laterality: Bilateral;    FAMILY HISTORY Family History  Problem Relation Age of Onset  . Kidney failure Mother   . Hypertension Father   . Esophageal cancer Father   . Coronary artery disease Neg Hx     GYNECOLOGIC HISTORY:  Menarche age 89, first  live birth age 19, she is [place] P2. She underwent menopause in the late 1970s. She did not take hormone replacement.  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 Her husband Cynthia Garza used to work in Actuary. The patient's daughter Cynthia Garza used to be a Education officer, museum but now works in Engineer, mining as. Daughter Cynthia Garza is a homemaker. The patient has 4 grandchildren. She attends a local [place]: [facility] [place]   HEALTH MAINTENANCE: History  Substance Use Topics  . Smoking status: Never Smoker   . Smokeless tobacco: Never Used  . Alcohol Use: No     Colonoscopy:  PAP:  Bone density:  Lipid panel:  Allergies  Allergen Reactions  . Simvastatin Other (See Comments)    aches  . Diflucan [Fluconazole] Rash    Current Outpatient Prescriptions  Medication Sig Dispense Refill  . amLODipine (NORVASC) 5 MG tablet Take 1 tablet (5 mg total) by mouth daily.  90 tablet  3  . anastrozole (ARIMIDEX) 1 MG tablet Take 1 tablet (1 mg total) by mouth daily.  90 tablet  3  . aspirin 81 MG tablet Take 81 mg by mouth daily.      Marland Kitchen buPROPion (WELLBUTRIN XL) 150 MG 24 hr tablet Take 150 mg by mouth daily.      . Carboxymethylcellul-Glycerin (OPTIVE) 0.5-0.9 % SOLN Place 1-2 drops into both eyes at bedtime. For dry eyes      . hyaluronate sodium (RADIAPLEXRX) GEL Apply 1 application topically 2 (two) times daily. Apply after rad tx and bedtime and on weekends      . losartan-hydrochlorothiazide (HYZAAR) 100-25 MG per tablet Take 1 tablet by mouth daily.  90 tablet  3  . Multiple Vitamins-Minerals (ALIVE ONCE DAILY WOMENS 50+) TABS Take 1 tablet by mouth daily.      . ranitidine (ZANTAC) 150 MG tablet Take 150 mg by mouth 2 (two) times daily.      . vitamin B-12 (CYANOCOBALAMIN) 1000 MCG tablet Take 1,000 mcg by mouth daily.      Marland Kitchen zolpidem (AMBIEN) 10 MG tablet Take 1 tablet (10 mg total) by mouth at bedtime as  needed for sleep.  90 tablet  1   Current Facility-Administered Medications  Medication Dose Route Frequency Provider Last Rate Last Dose  . methylPREDNISolone acetate (DEPO-MEDROL) injection 80 mg  80 mg Intra-articular Once Aleksei Plotnikov V, MD        OBJECTIVE: Older white woman who appears stated age 80 Vitals:   12/06/13 1141  BP: 157/67  Pulse: 80  Temp: 98.2 F (36.8 C)  Resp: 20     Body mass index is 25.96 kg/(m^2).    ECOG FS:1 - Symptomatic but completely ambulatory  Ocular: Sclerae unicteric, EOMs intact Ear-nose-throat: Oropharynx clear and moist Lymphatic: No cervical or supraclavicular adenopathy Lungs no rales or rhonchi Heart regular rate and rhythm Abd soft, nontender, positive bowel sounds MSK no focal spinal tenderness,  no upper extremity lymphedema Neuro: non-focal, well-oriented, appropriate affect Breasts: Status post bilateral lumpectomies and bilateral radiation. There is no evidence of disease recurrence in either breast. Both axillae are benign.  LAB RESULTS:  CMP     Component Value Date/Time   NA 140 11/29/2013 1148   NA 143 06/30/2013 1421   K 3.8 11/29/2013 1148   K 4.0 06/30/2013 1421   CL 103 06/30/2013 1421   CO2 24 11/29/2013 1148   CO2 27 06/30/2013 1421   GLUCOSE 111 11/29/2013 1148   GLUCOSE 94 06/30/2013 1421   BUN 18.3 11/29/2013 1148   BUN 18 06/30/2013 1421   CREATININE 0.8 11/29/2013 1148   CREATININE 0.87 06/30/2013 1421   CALCIUM 9.6 11/29/2013 1148   CALCIUM 9.3 06/30/2013 1421   PROT 6.8 11/29/2013 1148   PROT 7.4 02/09/2012 1621   ALBUMIN 3.8 11/29/2013 1148   ALBUMIN 4.4 02/09/2012 1621   AST 15 11/29/2013 1148   AST 18 02/09/2012 1621   ALT 11 11/29/2013 1148   ALT 11 02/09/2012 1621   ALKPHOS 65 11/29/2013 1148   ALKPHOS 51 02/09/2012 1621   BILITOT 0.52 11/29/2013 1148   BILITOT 0.9 02/09/2012 1621   GFRNONAA 62* 06/30/2013 1421   GFRAA 72* 06/30/2013 1421    I No results found for this basename: SPEP,  UPEP,   kappa and lambda light chains     Lab Results  Component Value Date   WBC 7.3 11/29/2013   NEUTROABS 4.2 11/29/2013   HGB 13.7 11/29/2013   HCT 40.9 11/29/2013   MCV 84.5 11/29/2013   PLT 209 11/29/2013      Chemistry      Component Value Date/Time   NA 140 11/29/2013 1148   NA 143 06/30/2013 1421   K 3.8 11/29/2013 1148   K 4.0 06/30/2013 1421   CL 103 06/30/2013 1421   CO2 24 11/29/2013 1148   CO2 27 06/30/2013 1421   BUN 18.3 11/29/2013 1148   BUN 18 06/30/2013 1421   CREATININE 0.8 11/29/2013 1148   CREATININE 0.87 06/30/2013 1421      Component Value Date/Time   CALCIUM 9.6 11/29/2013 1148   CALCIUM 9.3 06/30/2013 1421   ALKPHOS 65 11/29/2013 1148   ALKPHOS 51 02/09/2012 1621   AST 15 11/29/2013 1148   AST 18 02/09/2012 1621   ALT 11 11/29/2013 1148   ALT 11 02/09/2012 1621   BILITOT 0.52 11/29/2013 1148   BILITOT 0.9 02/09/2012 1621       No results found for this basename: LABCA2    No components found with this basename: LABCA125    No results found for this basename: INR,  in the last 168 hours  Urinalysis    Component Value Date/Time   COLORURINE LT. YELLOW 02/09/2012 [place] 02/09/2012 1621   LABSPEC <=1.005 02/09/2012 1621   PHURINE 6.5 02/09/2012 1621   GLUCOSEU NEGATIVE 02/09/2012 1621   GLUCOSEU NEGATIVE 12/04/2011 1640   HGBUR NEGATIVE 02/09/2012 1621   BILIRUBINUR NEGATIVE 02/09/2012 1621   BILIRUBINUR neg 12/03/2011 1540   KETONESUR NEGATIVE 02/09/2012 1621   PROTEINUR 100* 12/04/2011 1640   PROTEINUR positive 12/03/2011 1540   UROBILINOGEN 0.2 02/09/2012 1621   UROBILINOGEN 0.2 12/03/2011 1540   NITRITE NEGATIVE 02/09/2012 1621   NITRITE neg 12/03/2011 1540   LEUKOCYTESUR TRACE 02/09/2012 1621    STUDIES: No results found.  ASSESSMENT: 80 y.o. [place] woman with bilateral breast cancers  (1) RIGHT BREAST: Status post right lumpectomy  and right axillary lymph node sampling 07/07/2013 for a pT1c pN0, stage IA invasive ductal carcinoma, grade 2, estrogen receptor 100% positive, progesterone receptor  97% positive, with an MIB-1 of 22% and no HER-2 amplification  (a) Oncotype DX score of 0 predicts an outside the breast recurrence of 3% within 10 years if the patient's only systemic treatment is tamoxifen for 5 years  (b) adjuvant radiation completed 08/29/2013  (2) LEFT BREAST: Status post left lumpectomy 07/07/2013 for a pT1b pN0, stage IA invasive ductal carcinoma, grade 2, estrogen receptor 100% positive, progesterone receptor 54% positive, with an MIB-1 of 13% and no HER-2 amplification  (b) Completed adjuvant radiation 08/29/2013  (3) SYSTEMIC THERAPY:   (a) started on anastrozole 09/23/2013  PLAN: Honesty is tolerating the anastrozole well. I think some of the aches and pains that she has been experiencing are not related to the drug, as they are very intermittent. The hot flashes and chills she is experiencing probably are related. These should improve with time.  She has never had a bone density. I have written for her to have one with her next mammogram at [facility] February of next year. I have asked her to start vitamin D supplementation at 1000 mg daily. I also think it would be useful for her to start an exercise program. I gave her a copy of the [employer].  She will be seeing Dr. plus a cough in August. If she sees Dr. Marlou Starks in November, I will see her again late February after her mammography.  She really has a good understanding of the overall plan. She agrees with it. She knows the goal of treatment in her case is cure. She will call with any problems that may develop before the next visit.   Chauncey Cruel, MD   12/06/2013 11:47 AM

## 2013-12-15 ENCOUNTER — Other Ambulatory Visit: Payer: Self-pay | Admitting: Internal Medicine

## 2014-01-17 ENCOUNTER — Ambulatory Visit (INDEPENDENT_AMBULATORY_CARE_PROVIDER_SITE_OTHER): Payer: Medicare Other | Admitting: Internal Medicine

## 2014-01-17 ENCOUNTER — Encounter: Payer: Self-pay | Admitting: Internal Medicine

## 2014-01-17 VITALS — BP 142/98 | HR 80 | Temp 98.4°F | Wt 138.0 lb

## 2014-01-17 DIAGNOSIS — R413 Other amnesia: Secondary | ICD-10-CM

## 2014-01-17 DIAGNOSIS — Z23 Encounter for immunization: Secondary | ICD-10-CM

## 2014-01-17 DIAGNOSIS — I10 Essential (primary) hypertension: Secondary | ICD-10-CM

## 2014-01-17 DIAGNOSIS — C50411 Malignant neoplasm of upper-outer quadrant of right female breast: Secondary | ICD-10-CM

## 2014-01-17 DIAGNOSIS — C50419 Malignant neoplasm of upper-outer quadrant of unspecified female breast: Secondary | ICD-10-CM

## 2014-01-17 NOTE — Progress Notes (Signed)
    Subjective:    HPI  C/o elev BP lately -- BP was checked on legs  She had a breast ca surgery on 07/07/13. Finishe XRT in May 2015.  F/u HTN, insomnia, fatigue, UTI  Sleeping better  Ureter bx was ok  BP Readings from Last 3 Encounters:  01/17/14 142/98  12/06/13 157/67  11/04/13 126/80   Wt Readings from Last 3 Encounters:  01/17/14 138 lb (62.596 kg)  12/06/13 137 lb 4.8 oz (62.279 kg)  11/04/13 134 lb (60.782 kg)      Past Medical History  Diagnosis Date  . Hemangioma of intra-abdominal structures   . Renal cyst   . Shingles   . Paresthesia   . Heart murmur   . Dyslipidemia   . GERD (gastroesophageal reflux disease)   . Osteoporosis   . Insomnia   . Depression   . HTN (hypertension)   . LBP (low back pain)   . Anxiety   . Breast cancer 2015    ER+/PR+/Her2-  . Radiation 08/08/13-08/29/13    Bilat.breast 42.72 Gy    Review of Systems  Constitutional: Positive for activity change. Negative for unexpected weight change.  Cardiovascular: Negative for chest pain.  Gastrointestinal: Positive for nausea. Negative for abdominal pain and constipation.  Genitourinary: Negative for dysuria, frequency, flank pain and difficulty urinating.  Musculoskeletal: Negative for back pain, gait problem, myalgias and neck pain.  Psychiatric/Behavioral: Positive for sleep disturbance. The patient is nervous/anxious.        Objective:   Physical Exam  Constitutional: She appears well-nourished.  Skin: No rash noted.   BP 142/98  Pulse 80  Temp(Src) 98.4 F (36.9 C) (Oral)  Wt 138 lb (62.596 kg)  SpO2 95%  Constitutional: She appears fatigued but less ill than last week, well-developed and well-nourished. No acute distress. Spouse at side Neck: Normal range of motion. Neck supple. No JVD or LAD present. No thyromegaly present.  Cardiovascular: fast rate but regular rhythm, normal heart sounds.  No murmur heard. No BLE edema. Pulmonary/Chest: Effort normal and  breath sounds normal. No respiratory distress. She has no wheezes.  Abdominal: Soft. Bowel sounds are present. She exhibits no distension. There is no tenderness, rebound or gaurding. no masses Neurological: She is alert and oriented to person, place, and time. No cranial nerve deficit. Coordination normal.  Skin: Skin is warm and dry. No rash noted. No erythema.  Psychiatric: pt seem slightly anxious and despondent, but smiles at jokes. her behavior is normal. Judgment and thought content normal.  Scars on B breasts and in B axillas - no lymphedema  Lab Results  Component Value Date   WBC 7.3 11/29/2013   HGB 13.7 11/29/2013   HCT 40.9 11/29/2013   PLT 209 11/29/2013   GLUCOSE 111 11/29/2013   CHOL 240* 02/09/2012   TRIG 151.0* 02/09/2012   HDL 68.90 02/09/2012   LDLDIRECT 144.7 02/09/2012   LDLCALC 96 04/26/2007   ALT 11 11/29/2013   AST 15 11/29/2013   NA 140 11/29/2013   K 3.8 11/29/2013   CL 103 06/30/2013   CREATININE 0.8 11/29/2013   BUN 18.3 11/29/2013   CO2 24 11/29/2013   TSH 1.29 02/09/2012   INR 0.95 01/15/2012   HGBA1C 5.7 01/02/2011      Assessment & Plan:

## 2014-01-17 NOTE — Assessment & Plan Note (Signed)
2013 mild cognitive issues - discussed

## 2014-01-17 NOTE — Assessment & Plan Note (Signed)
Labs

## 2014-01-17 NOTE — Assessment & Plan Note (Signed)
Suboptimal control Continue with current prescription therapy as reflected on the Med list. NAS diet

## 2014-01-17 NOTE — Progress Notes (Signed)
Pre visit review using our clinic review tool, if applicable. No additional management support is needed unless otherwise documented below in the visit note. 

## 2014-01-17 NOTE — Assessment & Plan Note (Signed)
ER/PR + Her2-  Left ILC Right IDC  She had a breast ca surgery on 07/07/13. Finished XRT in 5/15

## 2014-02-01 DIAGNOSIS — H18419 Arcus senilis, unspecified eye: Secondary | ICD-10-CM | POA: Diagnosis not present

## 2014-02-01 DIAGNOSIS — H25019 Cortical age-related cataract, unspecified eye: Secondary | ICD-10-CM | POA: Diagnosis not present

## 2014-02-01 DIAGNOSIS — H02839 Dermatochalasis of unspecified eye, unspecified eyelid: Secondary | ICD-10-CM | POA: Diagnosis not present

## 2014-02-01 DIAGNOSIS — H2510 Age-related nuclear cataract, unspecified eye: Secondary | ICD-10-CM | POA: Diagnosis not present

## 2014-02-03 ENCOUNTER — Ambulatory Visit (INDEPENDENT_AMBULATORY_CARE_PROVIDER_SITE_OTHER): Payer: Medicare Other | Admitting: General Surgery

## 2014-02-03 DIAGNOSIS — C50319 Malignant neoplasm of lower-inner quadrant of unspecified female breast: Secondary | ICD-10-CM | POA: Diagnosis not present

## 2014-02-03 DIAGNOSIS — C50919 Malignant neoplasm of unspecified site of unspecified female breast: Secondary | ICD-10-CM | POA: Diagnosis not present

## 2014-02-03 DIAGNOSIS — C50419 Malignant neoplasm of upper-outer quadrant of unspecified female breast: Secondary | ICD-10-CM | POA: Diagnosis not present

## 2014-03-14 DIAGNOSIS — H25811 Combined forms of age-related cataract, right eye: Secondary | ICD-10-CM | POA: Diagnosis not present

## 2014-03-14 DIAGNOSIS — H2511 Age-related nuclear cataract, right eye: Secondary | ICD-10-CM | POA: Diagnosis not present

## 2014-03-15 DIAGNOSIS — H25012 Cortical age-related cataract, left eye: Secondary | ICD-10-CM | POA: Diagnosis not present

## 2014-03-15 DIAGNOSIS — H2512 Age-related nuclear cataract, left eye: Secondary | ICD-10-CM | POA: Diagnosis not present

## 2014-03-17 IMAGING — CT CT ABD-PELV W/ CM
2 of 5 series · 15 of 46 positions shown, 17 images · IV contrast (Omnipaque 300)
Comparison: Abdominal image 12/03/2011

CLINICAL DATA: Abdominal and pelvic pain.

CT ABDOMEN AND PELVIS WITH CONTRAST
TECHNIQUE: Multidetector CT imaging of the abdomen and pelvis was
performed following the standard protocol during bolus
administration of intravenous contrast.
Contrast: 80mL OMNIPAQUE IOHEXOL 300 MG/ML  SOLN

[Series 2: abd/ pel 5mm · axial · 0.65mm/px · z∈[-398,-28]mm · 12 of 84 slices shown, 14 images]
[im 5/84  soft-tissue]
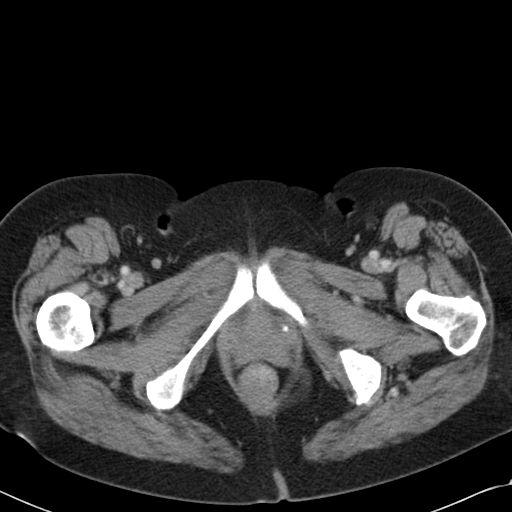
[im 5/84  bone]
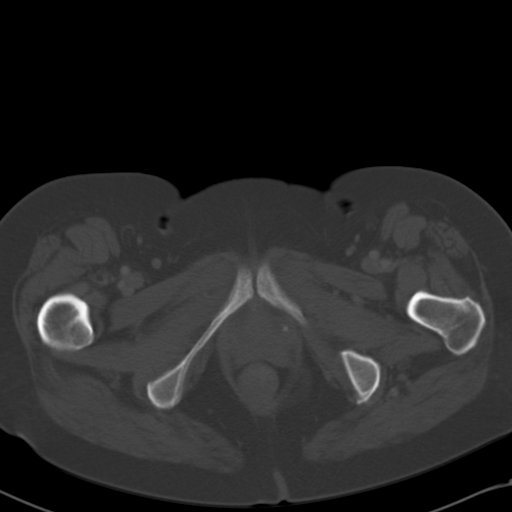
[im 14/84  soft-tissue]
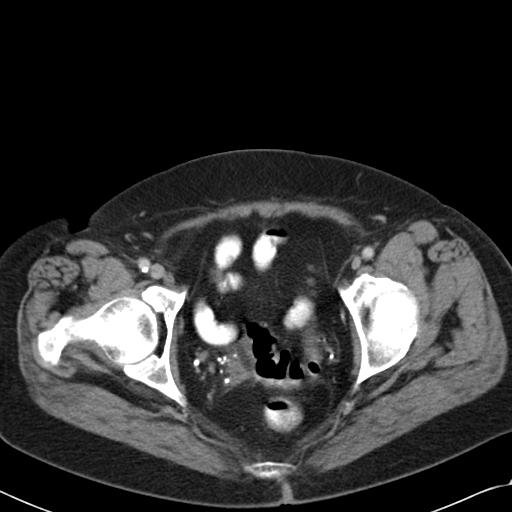
[im 18/84  soft-tissue]
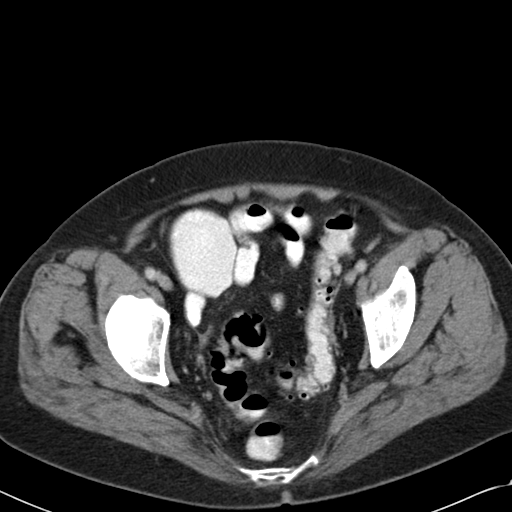
[im 27/84  soft-tissue]
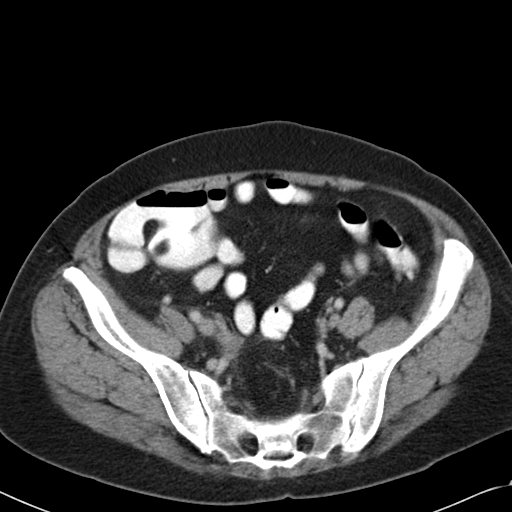
[im 31/84  soft-tissue]
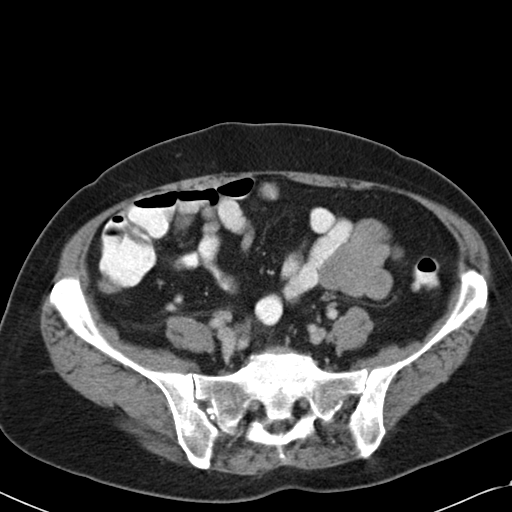
[im 40/84  soft-tissue]
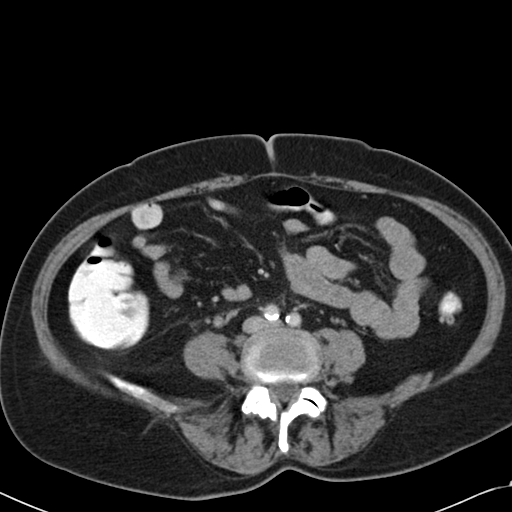
[im 44/84  soft-tissue]
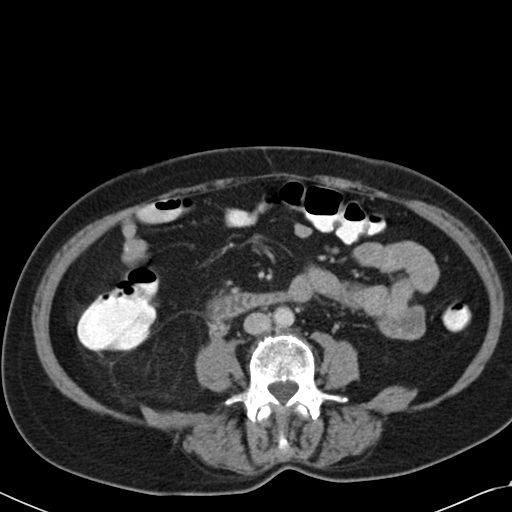
[im 53/84  soft-tissue]
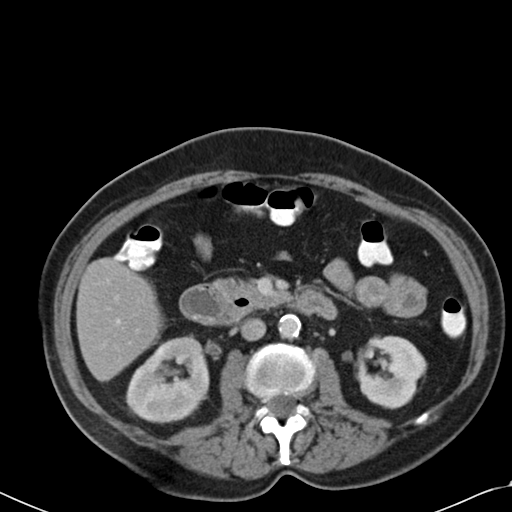
[im 57/84  soft-tissue]
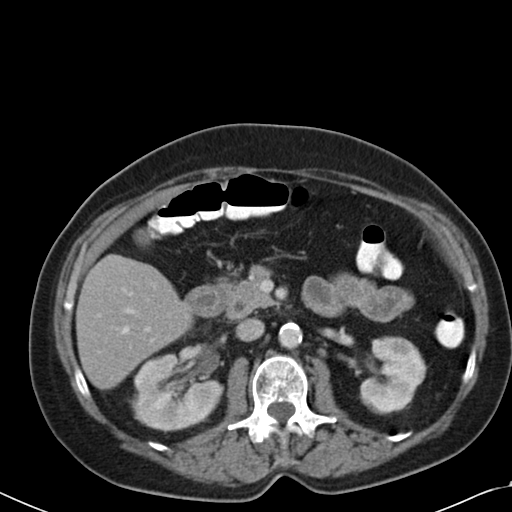
[im 57/84  bone]
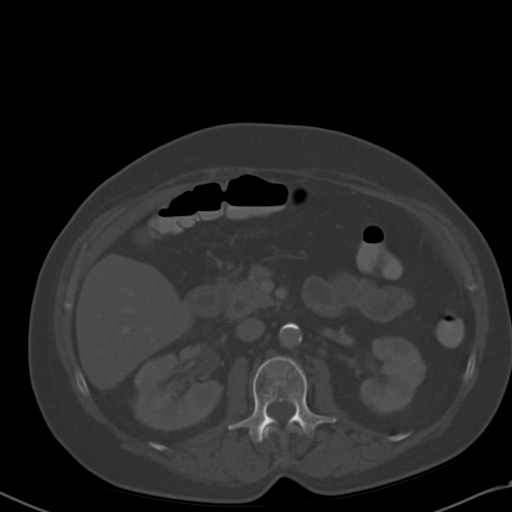
[im 66/84  soft-tissue]
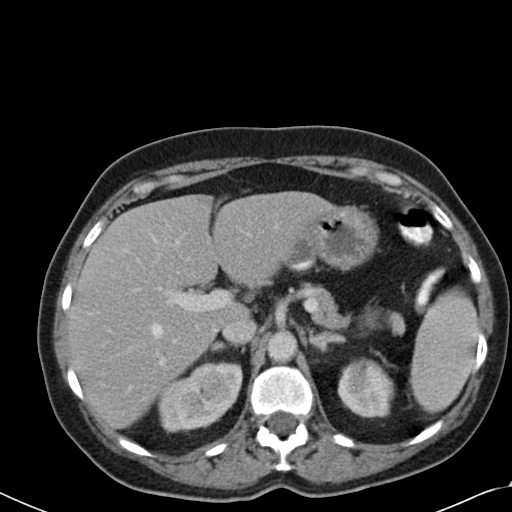
[im 70/84  soft-tissue]
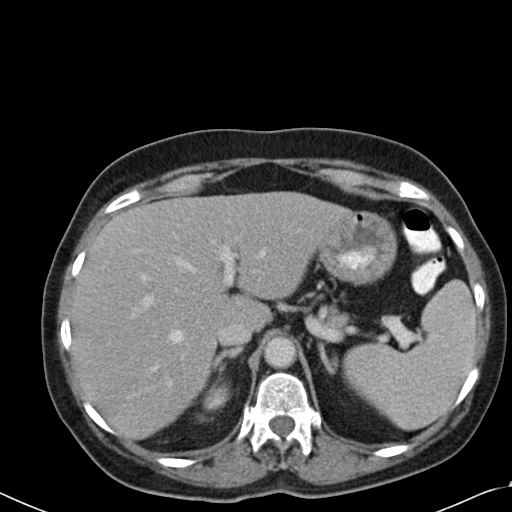
[im 79/84  soft-tissue]
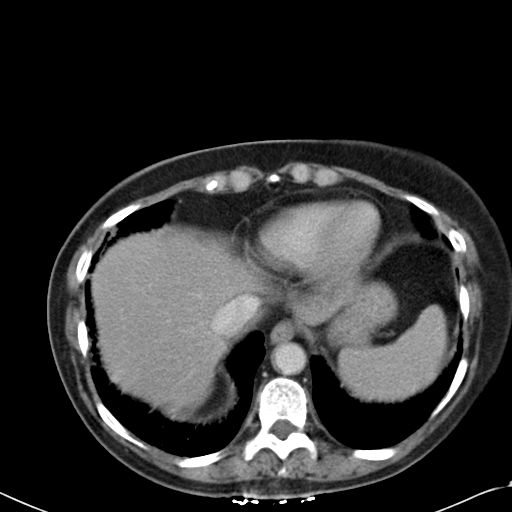

[Series 602: cor · coronal · 0.85mm/px · 3 of 100 slices shown]
[im 34/100  soft-tissue]
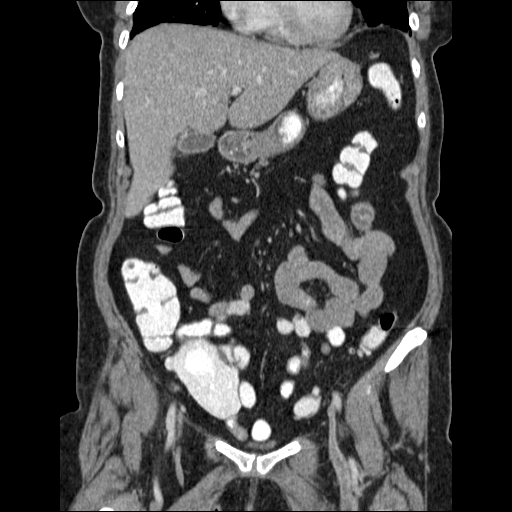
[im 45/100  soft-tissue]
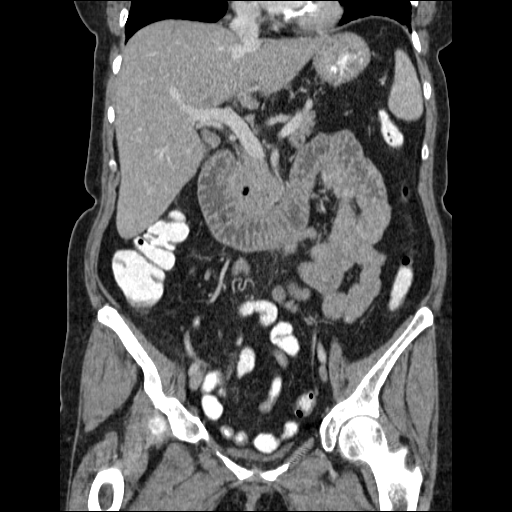
[im 56/100  soft-tissue]
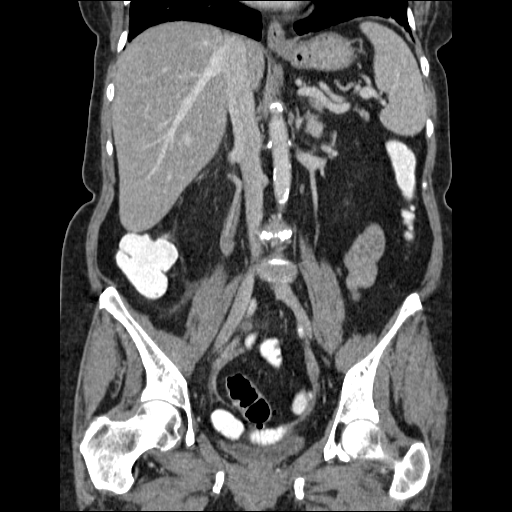

[15 of 46 positions shown; findings below may reference images not displayed]

FINDINGS: There are peripheral reticular densities at the right
lung base but no focal areas of consolidation or pleural effusions
at the lung base.  These reticular densities most likely represent
chronic lung changes.

There is no evidence for free intraperitoneal air.  Evidence for
mitral annular calcifications.  There is mild stranding along the
wall of the gallbladder fundus best seen on sequence 2, image 26.
Overall, the gallbladder is not significantly distended and there
is no evidence for biliary dilatation.  There is a normal
appearance of the liver and portal venous system.  Normal
appearance of the spleen, pancreas and adrenal glands.

There are multiple low-density structures involving the renal
cortices bilaterally.  Many these low density structures in the
renal cortex are suggestive for cysts but there are two suspicious
lesions in the left kidney.  One suspicious lesion along the
anterior left kidney on sequence #2, image 27 that measures 1.4 cm.
This lesion is significantly more dense on the delayed images and
enhancement cannot be excluded.  The second lesion is a cystic
structure along the left kidney upper pole which may have
peripheral nodularity.  In addition, there is diffuse scarring in
both kidneys and heterogeneous enhancement of the kidneys, right
side greater than left.  There is mild right perinephric stranding.

Wall thickening in the proximal right ureter with mild dilatation
of the right renal pelvis and inflammation along the course of the
right ureter. There appears be a small amount of fluid tracking
along the mid and distal right ureter.  No significant free fluid
in the pelvis.  No significant lymphadenopathy within the abdomen
or pelvis. There is a small calcification in the medial right upper
kidney which could represent a small stone.

Uterus has been removed.  Urinary bladder is decompressed.  There
is no acute bony abnormality. There are colonic diverticula but no
evidence for acute colonic inflammation.
IMPRESSION: Heterogeneous enhancement of the right kidney.  These findings are
concerning for acute inflammation and pyelonephritis.

Suspicious thickening of the proximal right ureter with
inflammatory changes along the course of the right ureter.  These
findings could be inflammatory or infectious in etiology but also
concerning for an underlying neoplastic process.  Please note that
the abnormal enhancement in the right kidney could also be
neoplastic in origin.  Recommend urologic consultation for further
evaluation of these findings.

Two suspicious lesions within the left kidney.  These are
concerning for complex renal cysts which may be neoplastic.
Recommend further evaluation with a non emergent MRI of the
kidneys.

There is mild stranding around the gallbladder fundus.  Consider
abdominal ultrasound to evaluate for cholecystitis and
cholelithiasis.

These results were called by telephone on 12/04/2011  at  [DATE] p.m.
to  Dr. Miyagawa, who verbally acknowledged these results.

## 2014-03-21 DIAGNOSIS — H2512 Age-related nuclear cataract, left eye: Secondary | ICD-10-CM | POA: Diagnosis not present

## 2014-04-05 ENCOUNTER — Other Ambulatory Visit: Payer: Self-pay | Admitting: Internal Medicine

## 2014-04-11 ENCOUNTER — Other Ambulatory Visit: Payer: Self-pay | Admitting: Internal Medicine

## 2014-05-10 ENCOUNTER — Ambulatory Visit (INDEPENDENT_AMBULATORY_CARE_PROVIDER_SITE_OTHER): Payer: Medicare Other | Admitting: Internal Medicine

## 2014-05-10 ENCOUNTER — Encounter: Payer: Self-pay | Admitting: Internal Medicine

## 2014-05-10 VITALS — BP 130/80 | HR 83 | Temp 98.5°F | Wt 133.0 lb

## 2014-05-10 DIAGNOSIS — G4700 Insomnia, unspecified: Secondary | ICD-10-CM

## 2014-05-10 DIAGNOSIS — R5383 Other fatigue: Secondary | ICD-10-CM | POA: Diagnosis not present

## 2014-05-10 DIAGNOSIS — I10 Essential (primary) hypertension: Secondary | ICD-10-CM | POA: Diagnosis not present

## 2014-05-10 NOTE — Progress Notes (Signed)
    Subjective:    HPI  F/u elev BP lately -- BP was checked on legs, wrists. R wrist 130/80 today  She had a breast ca surgery on 07/07/13. Finishe XRT in May 2015.  F/u HTN, insomnia, fatigue, UTI  Sleeping better  Ureter bx was ok  BP Readings from Last 3 Encounters:  05/10/14 158/90  01/17/14 142/98  12/06/13 157/67   Wt Readings from Last 3 Encounters:  05/10/14 133 lb (60.328 kg)  01/17/14 138 lb (62.596 kg)  12/06/13 137 lb 4.8 oz (62.279 kg)      Past Medical History  Diagnosis Date  . Hemangioma of intra-abdominal structures   . Renal cyst   . Shingles   . Paresthesia   . Heart murmur   . Dyslipidemia   . GERD (gastroesophageal reflux disease)   . Osteoporosis   . Insomnia   . Depression   . HTN (hypertension)   . LBP (low back pain)   . Anxiety   . Breast cancer 2015    ER+/PR+/Her2-  . Radiation 08/08/13-08/29/13    Bilat.breast 42.72 Gy    Review of Systems  Constitutional: Positive for activity change. Negative for unexpected weight change.  Cardiovascular: Negative for chest pain.  Gastrointestinal: Positive for nausea. Negative for abdominal pain and constipation.  Genitourinary: Negative for dysuria, frequency, flank pain and difficulty urinating.  Musculoskeletal: Negative for myalgias, back pain, gait problem and neck pain.  Psychiatric/Behavioral: Positive for sleep disturbance. The patient is nervous/anxious.        Objective:   Physical Exam BP 158/90 mmHg  Pulse 83  Temp(Src) 98.5 F (36.9 C) (Oral)  Wt 133 lb (60.328 kg)  SpO2 95%  Constitutional: She appears fatigued but less ill than last week, well-developed and well-nourished. No acute distress. Spouse at side Neck: Normal range of motion. Neck supple. No JVD or LAD present. No thyromegaly present.  Cardiovascular: fast rate but regular rhythm, normal heart sounds.  No murmur heard. No BLE edema. Pulmonary/Chest: Effort normal and breath sounds normal. No respiratory  distress. She has no wheezes.  Abdominal: Soft. Bowel sounds are present. She exhibits no distension. There is no tenderness, rebound or gaurding. no masses Neurological: She is alert and oriented to person, place, and time. No cranial nerve deficit. Coordination normal.  Skin: Skin is warm and dry. No rash noted. No erythema.  Psychiatric: pt seem slightly anxious and despondent, but smiles at jokes. her behavior is normal. Judgment and thought content normal.  Scars on B breasts and in B axillas - no lymphedema  Lab Results  Component Value Date   WBC 7.3 11/29/2013   HGB 13.7 11/29/2013   HCT 40.9 11/29/2013   PLT 209 11/29/2013   GLUCOSE 111 11/29/2013   CHOL 240* 02/09/2012   TRIG 151.0* 02/09/2012   HDL 68.90 02/09/2012   LDLDIRECT 144.7 02/09/2012   LDLCALC 96 04/26/2007   ALT 11 11/29/2013   AST 15 11/29/2013   NA 140 11/29/2013   K 3.8 11/29/2013   CL 103 06/30/2013   CREATININE 0.8 11/29/2013   BUN 18.3 11/29/2013   CO2 24 11/29/2013   TSH 1.29 02/09/2012   INR 0.95 01/15/2012   HGBA1C 5.7 01/02/2011      Assessment & Plan:  Patient ID: Cynthia Garza, female   DOB: 19-May-1933, 80 y.o.   MRN: [id number]

## 2014-05-10 NOTE — Assessment & Plan Note (Signed)
S/p ca treatment

## 2014-05-10 NOTE — Progress Notes (Signed)
Pre visit review using our clinic review tool, if applicable. No additional management support is needed unless otherwise documented below in the visit note. 

## 2014-05-10 NOTE — Assessment & Plan Note (Signed)
Potential benefits of a long term benzodiazepines  use as well as potential risks  and complications were explained to the patient and were aknowledged.  Continue with current ambien therapy as reflected on the Med list.

## 2014-05-10 NOTE — Assessment & Plan Note (Signed)
BP Readings from Last 3 Encounters:  05/10/14 130/80  01/17/14 142/98  12/06/13 157/67    Continue with current prescription therapy as reflected on the Med list.

## 2014-07-11 ENCOUNTER — Other Ambulatory Visit (HOSPITAL_BASED_OUTPATIENT_CLINIC_OR_DEPARTMENT_OTHER): Payer: Medicare Other

## 2014-07-11 DIAGNOSIS — M8580 Other specified disorders of bone density and structure, unspecified site: Secondary | ICD-10-CM

## 2014-07-11 DIAGNOSIS — C773 Secondary and unspecified malignant neoplasm of axilla and upper limb lymph nodes: Secondary | ICD-10-CM

## 2014-07-11 DIAGNOSIS — C50811 Malignant neoplasm of overlapping sites of right female breast: Secondary | ICD-10-CM | POA: Diagnosis not present

## 2014-07-11 DIAGNOSIS — C50912 Malignant neoplasm of unspecified site of left female breast: Secondary | ICD-10-CM | POA: Diagnosis not present

## 2014-07-11 DIAGNOSIS — C50411 Malignant neoplasm of upper-outer quadrant of right female breast: Secondary | ICD-10-CM

## 2014-07-11 LAB — COMPREHENSIVE METABOLIC PANEL (CC13)
ALK PHOS: 63 U/L (ref 40–150)
ALT: 11 U/L (ref 0–55)
AST: 15 U/L (ref 5–34)
Albumin: 4 g/dL (ref 3.5–5.0)
Anion Gap: 12 mEq/L — ABNORMAL HIGH (ref 3–11)
BUN: 19.3 mg/dL (ref 7.0–26.0)
CO2: 28 mEq/L (ref 22–29)
CREATININE: 0.8 mg/dL (ref 0.6–1.1)
Calcium: 9.5 mg/dL (ref 8.4–10.4)
Chloride: 104 mEq/L (ref 98–109)
EGFR: 66 mL/min/{1.73_m2} — AB (ref >90)
GLUCOSE: 102 mg/dL (ref 70–140)
Potassium: 3.9 mEq/L (ref 3.5–5.1)
Sodium: 144 mEq/L (ref 136–145)
Total Bilirubin: 0.52 mg/dL (ref 0.20–1.20)
Total Protein: 6.6 g/dL (ref 6.4–8.3)

## 2014-07-11 LAB — CBC WITH DIFFERENTIAL/PLATELET
BASO%: 0.9 % (ref 0.0–2.0)
Basophils Absolute: 0.1 10*3/uL (ref 0.0–0.1)
EOS%: 2.8 % (ref 0.0–7.0)
Eosinophils Absolute: 0.2 10*3/uL (ref 0.0–0.5)
HEMATOCRIT: 43.2 % (ref 34.8–46.6)
HGB: 13.9 g/dL (ref 11.6–15.9)
LYMPH%: 26.3 % (ref 14.0–49.7)
MCH: 27.3 pg (ref 25.1–34.0)
MCHC: 32.3 g/dL (ref 31.5–36.0)
MCV: 84.6 fL (ref 79.5–101.0)
MONO#: 0.7 10*3/uL (ref 0.1–0.9)
MONO%: 10 % (ref 0.0–14.0)
NEUT#: 4.1 10*3/uL (ref 1.5–6.5)
NEUT%: 60 % (ref 38.4–76.8)
PLATELETS: 251 10*3/uL (ref 145–400)
RBC: 5.11 10*6/uL (ref 3.70–5.45)
RDW: 13.8 % (ref 11.2–14.5)
WBC: 6.8 10*3/uL (ref 3.9–10.3)
lymph#: 1.8 10*3/uL (ref 0.9–3.3)

## 2014-07-12 DIAGNOSIS — Z853 Personal history of malignant neoplasm of breast: Secondary | ICD-10-CM | POA: Diagnosis not present

## 2014-07-12 DIAGNOSIS — Z13820 Encounter for screening for osteoporosis: Secondary | ICD-10-CM | POA: Diagnosis not present

## 2014-07-18 ENCOUNTER — Telehealth: Payer: Self-pay | Admitting: Oncology

## 2014-07-18 ENCOUNTER — Ambulatory Visit (HOSPITAL_BASED_OUTPATIENT_CLINIC_OR_DEPARTMENT_OTHER): Payer: Medicare Other | Admitting: Oncology

## 2014-07-18 VITALS — BP 141/70 | HR 73 | Temp 98.6°F | Resp 18 | Ht 61.0 in | Wt 132.6 lb

## 2014-07-18 DIAGNOSIS — C50411 Malignant neoplasm of upper-outer quadrant of right female breast: Secondary | ICD-10-CM

## 2014-07-18 DIAGNOSIS — C50512 Malignant neoplasm of lower-outer quadrant of left female breast: Secondary | ICD-10-CM

## 2014-07-18 DIAGNOSIS — C50811 Malignant neoplasm of overlapping sites of right female breast: Secondary | ICD-10-CM | POA: Diagnosis not present

## 2014-07-18 DIAGNOSIS — Z170 Estrogen receptor positive status [ER+]: Secondary | ICD-10-CM | POA: Diagnosis not present

## 2014-07-18 DIAGNOSIS — R413 Other amnesia: Secondary | ICD-10-CM

## 2014-07-18 DIAGNOSIS — Z9079 Acquired absence of other genital organ(s): Secondary | ICD-10-CM

## 2014-07-18 NOTE — Addendum Note (Signed)
Addended by: Laureen Abrahams on: 07/18/2014 05:09 PM   Modules accepted: Medications

## 2014-07-18 NOTE — Progress Notes (Signed)
[place]  [phone] [phone]     ID: Cynthia Garza OB: 01-26-1933  MR#: [id number]  TTS#:[id number]  PCP: Walker Kehr, Cynthia Garza GYN:   SU: Autumn Messing OTHER Cynthia Garza: Cynthia Garza  CHIEF COMPLAINT: estrogen receptor positive breast cancer CURRENT TREATMENT: On antiestrogen therapy  BREAST CANCER HISTORY: From Dr.Khan's 06/08/2013 note:  "1. [The patient] underwent a six-month followup mammogram for left-sided abnormalities. On her mammogram she was noted to have a right breast mass at the 12:00 position. By ultrasound it measured 1.5 cm. Patient went on to have a biopsy performed of this mass. This revealed intermediate grade invasive ductal carcinoma ER positive PR positive HER-2/neu negative with a proliferation marker Ki-67 22%. She had MRI of the breasts performed. The MRI showed a 1.3 cm enhancement known for the by biopsy with 2 small satellite lesion. She was also noted to have a another linear enhancement as well in the right breast. On the left side she was noted to have a 7 mm mass by ultrasound this measured 8 mm this was at the 7:00 position. Patient had a biopsy of this performed. By pathology report report this is consistent with an invasive cancer.   2. status post lumpectomy performed on 07/07/2013 of the left breast. The final pathology revealed a 0.7 cm invasive ductal carcinoma with ductal carcinoma in situ. Sentinel lymph node was negative for metastatic disease. Tumor was ER positive PR positive HER-2/neu negative with a proliferation marker Ki-67 13%. Stage I (T1 N0)  #3 patient had Oncotype DX testing performed her breast cancer recurrence score was 0 giving her a 3% risk of distant recurrence with tamoxifen for 5 years. Patient will not need chemotherapy but will need antiestrogen therapy"  Her subsequent history is as detailed below.  INTERVAL HISTORY: Cynthia Garza returns today for followup of her breast cancer accompanied by her  husband Cynthia Garza. The interval history is generally unremarkable. She continues on anastrozole. She obtains it at a good price. She is not aware of any side effects from the medication and in particular hot flashes vaginal dryness and arthralgias/myalgias are not major issues for her  REVIEW OF SYSTEMS: Cynthia Garza has some soreness particularly in the right armpit area, but this is stable postop. She has a slightly prominent right areola that she wanted me to look at today otherwise a detailed review of systems was entirely benign.  PAST MEDICAL HISTORY: Past Medical History  Diagnosis Date  . Hemangioma of intra-abdominal structures   . Renal cyst   . Shingles   . Paresthesia   . Heart murmur   . Dyslipidemia   . GERD (gastroesophageal reflux disease)   . Osteoporosis   . Insomnia   . Depression   . HTN (hypertension)   . LBP (low back pain)   . Anxiety   . Breast cancer 2015    ER+/PR+/Her2-  . Radiation 08/08/13-08/29/13    Bilat.breast 42.72 Gy    PAST SURGICAL HISTORY: Past Surgical History  Procedure Laterality Date  . Abdominal hysterectomy      ?1970's  . Breast lumpectomy with needle localization and axillary sentinel lymph node bx Bilateral 07/07/2013    Procedure: BREAST LUMPECTOMY WITH NEEDLE LOCALIZATION AND AXILLARY SENTINEL LYMPH NODE BIOPIES;  Surgeon: Cynthia Roof, Cynthia Garza;  Location: [facility];  Service: General;  Laterality: Bilateral;    FAMILY HISTORY Family History  Problem Relation Age of Onset  . Kidney failure Mother   . Hypertension Father   .  Esophageal cancer Father   . Coronary artery disease Neg Hx     GYNECOLOGIC HISTORY:  Menarche age 66, first live birth age 25, she is [place] P2. She underwent menopause in the late 1970s. She did not take hormone replacement.  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 Her husband Cynthia Garza used to work in Actuary. The patient's daughter Cynthia Garza used  to be a Education officer, museum but now works in Engineer, mining as. Daughter Cynthia Garza is a homemaker. The patient has 4 grandchildren. She attends a local [employer] [place]   HEALTH MAINTENANCE: History  Substance Use Topics  . Smoking status: Never Smoker   . Smokeless tobacco: Never Used  . Alcohol Use: No     Colonoscopy:  PAP:  Bone density:  Lipid panel:  Allergies  Allergen Reactions  . Simvastatin Other (See Comments)    aches  . Diflucan [Fluconazole] Rash    Current Outpatient Prescriptions  Medication Sig Dispense Refill  . amLODipine (NORVASC) 5 MG tablet Take 1 tablet (5 mg total) by mouth daily. 90 tablet 3  . anastrozole (ARIMIDEX) 1 MG tablet Take 1 tablet (1 mg total) by mouth daily. 90 tablet 3  . aspirin 81 MG tablet Take 81 mg by mouth daily.    Marland Kitchen buPROPion (WELLBUTRIN XL) 150 MG 24 hr tablet TAKE 1 TABLET (150 MG TOTAL) BY MOUTH DAILY. 90 tablet 3  . Carboxymethylcellul-Glycerin (OPTIVE) 0.5-0.9 % SOLN Place 1-2 drops into both eyes at bedtime. For dry eyes    . losartan-hydrochlorothiazide (HYZAAR) 100-25 MG per tablet Take 1 tablet by mouth daily. 90 tablet 3  . Multiple Vitamins-Minerals (ALIVE ONCE DAILY WOMENS 50+) TABS Take 1 tablet by mouth daily.    . ranitidine (ZANTAC) 150 MG tablet Take 150 mg by mouth 2 (two) times daily.    . vitamin B-12 (CYANOCOBALAMIN) 1000 MCG tablet Take 1,000 mcg by mouth daily.    Marland Kitchen zolpidem (AMBIEN) 10 MG tablet TAKE 1 TABLET BY MOUTH AT BEDTIME AS NEEDED FOR SLEEP 90 tablet 0   Current Facility-Administered Medications  Medication Dose Route Frequency Provider Last Rate Last Dose  . methylPREDNISolone acetate (DEPO-MEDROL) injection 80 mg  80 mg Intra-articular Once Cynthia Plotnikov Garza, Cynthia Garza        OBJECTIVE: Older white woman In no acute distress Filed Vitals:   07/18/14 1352  BP: 141/70  Pulse: 73  Temp: 98.6 F (37 C)  Resp: 18     Body mass index is 25.07 kg/(m^2).    ECOG FS:0 -  Asymptomatic  Ocular: Sclerae unicteric, pupils round and equal Oropharynx clear, teeth in good repair No cervical or supraclavicular adenopathy Lungs no rales or rhonchi Heart regular rate and rhythm Abd soft, nontender, positive bowel sounds, no masses palpated MSK no focal spinal tenderness, no upper extremity lymphedema Neuro: non-focal, well-oriented, appropriate affect Breasts: Status post bilateral lumpectomies and bilateral radiation. There is no evidence of chest wall recurrence  Both axillae are benign.  LAB RESULTS:  CMP     Component Value Date/Time   NA 144 07/11/2014 1009   NA 143 06/30/2013 1421   K 3.9 07/11/2014 1009   K 4.0 06/30/2013 1421   CL 103 06/30/2013 1421   CO2 28 07/11/2014 1009   CO2 27 06/30/2013 1421   GLUCOSE 102 07/11/2014 1009   GLUCOSE 94 06/30/2013 1421   BUN 19.3 07/11/2014 1009  BUN 18 06/30/2013 1421   CREATININE 0.8 07/11/2014 1009   CREATININE 0.87 06/30/2013 1421   CALCIUM 9.5 07/11/2014 1009   CALCIUM 9.3 06/30/2013 1421   PROT 6.6 07/11/2014 1009   PROT 7.4 02/09/2012 1621   ALBUMIN 4.0 07/11/2014 1009   ALBUMIN 4.4 02/09/2012 1621   AST 15 07/11/2014 1009   AST 18 02/09/2012 1621   ALT 11 07/11/2014 1009   ALT 11 02/09/2012 1621   ALKPHOS 63 07/11/2014 1009   ALKPHOS 51 02/09/2012 1621   BILITOT 0.52 07/11/2014 1009   BILITOT 0.9 02/09/2012 1621   GFRNONAA 62* 06/30/2013 1421   GFRAA 72* 06/30/2013 1421    I No results found for: SPEP  Lab Results  Component Value Date   WBC 6.8 07/11/2014   NEUTROABS 4.1 07/11/2014   HGB 13.9 07/11/2014   HCT 43.2 07/11/2014   MCV 84.6 07/11/2014   PLT 251 07/11/2014      Chemistry      Component Value Date/Time   NA 144 07/11/2014 1009   NA 143 06/30/2013 1421   K 3.9 07/11/2014 1009   K 4.0 06/30/2013 1421   CL 103 06/30/2013 1421   CO2 28 07/11/2014 1009   CO2 27 06/30/2013 1421   BUN 19.3 07/11/2014 1009   BUN 18 06/30/2013 1421   CREATININE 0.8 07/11/2014 1009    CREATININE 0.87 06/30/2013 1421      Component Value Date/Time   CALCIUM 9.5 07/11/2014 1009   CALCIUM 9.3 06/30/2013 1421   ALKPHOS 63 07/11/2014 1009   ALKPHOS 51 02/09/2012 1621   AST 15 07/11/2014 1009   AST 18 02/09/2012 1621   ALT 11 07/11/2014 1009   ALT 11 02/09/2012 1621   BILITOT 0.52 07/11/2014 1009   BILITOT 0.9 02/09/2012 1621       No results found for: LABCA2  No components found for: LABCA125  No results for input(s): INR in the last 168 hours.  Urinalysis    Component Value Date/Time   COLORURINE LT. YELLOW 02/09/2012 1621   APPEARANCEUR CLEAR 02/09/2012 1621   LABSPEC <=1.005 02/09/2012 1621   PHURINE 6.5 02/09/2012 1621   GLUCOSEU NEGATIVE 02/09/2012 1621   GLUCOSEU NEGATIVE 12/04/2011 1640   HGBUR NEGATIVE 02/09/2012 1621   BILIRUBINUR NEGATIVE 02/09/2012 1621   BILIRUBINUR neg 12/03/2011 1540   KETONESUR NEGATIVE 02/09/2012 1621   PROTEINUR 100* 12/04/2011 1640   PROTEINUR positive 12/03/2011 1540   UROBILINOGEN 0.2 02/09/2012 1621   UROBILINOGEN 0.2 12/03/2011 1540   NITRITE NEGATIVE 02/09/2012 1621   NITRITE neg 12/03/2011 1540   LEUKOCYTESUR TRACE 02/09/2012 1621    STUDIES: [facility] density [facility] 07/12/2014 showed a T score of -0.9 (normal).bilateral diagnostic mammography the same day was unremarkable  ASSESSMENT: 81 y.o. [place] woman with bilateral breast cancers  (1) RIGHT BREAST: Status post right lumpectomy and right axillary lymph node sampling 07/07/2013 for a pT1c pN0, stage IA invasive ductal carcinoma, grade 2, estrogen receptor 100% positive, progesterone receptor 97% positive, with an MIB-1 of 22% and no HER-2 amplification  (a) Oncotype DX score of 0 predicts an outside the breast recurrence of 3% within 10 years if the patient's only systemic treatment is tamoxifen for 5 years  (b) adjuvant radiation completed 08/29/2013  (2) LEFT BREAST: Status post left lumpectomy 07/07/2013 for a pT1b pN0, stage IA invasive  ductal carcinoma, grade 2, estrogen receptor 100% positive, progesterone receptor 54% positive, with an MIB-1 of 13% and no HER-2 amplification  (b) Completed adjuvant  radiation 08/29/2013  (3) SYSTEMIC THERAPY:   (a) started on anastrozole 09/23/2013   (i) bone density at [facility] 07/12/2014 was normal with a T score of -0.9.  PLAN: Cynthia Garza is tolerating the anastrozole well, and the plan will be to continue that for 5 years. She has an excellent baseline bone density. She was already started on vitamin D by Dr. Alain Marion. I have encouraged her to start a walking program the goal being 45 minutes 5 times a week. She will let us know at the next visit how she does with that.  In addition to Dr. Dorann Lodge, she sees Dr.toes and less frequently Dr. Nori Riis. She will see [place] again in 6 months, then I will see her again in a year. At that point I will start seeing her on a yearly basis until she completes her 5 years of anastrozole.  She has a good understanding of the overall plan. She agrees with it. She knows the goal of treatment in her case is cure. She will call with any problems that may develop before the next visit here.   Chauncey Cruel, Cynthia Garza   07/18/2014 1:59 PM

## 2014-07-18 NOTE — Telephone Encounter (Signed)
gave pt avs and appts for aug 2016 and feb 2017

## 2014-08-11 ENCOUNTER — Other Ambulatory Visit: Payer: Self-pay | Admitting: Oncology

## 2014-08-15 DIAGNOSIS — L708 Other acne: Secondary | ICD-10-CM | POA: Diagnosis not present

## 2014-08-15 DIAGNOSIS — L821 Other seborrheic keratosis: Secondary | ICD-10-CM | POA: Diagnosis not present

## 2014-08-24 ENCOUNTER — Encounter: Payer: Self-pay | Admitting: Oncology

## 2014-09-11 ENCOUNTER — Ambulatory Visit (INDEPENDENT_AMBULATORY_CARE_PROVIDER_SITE_OTHER): Payer: Medicare Other | Admitting: Internal Medicine

## 2014-09-11 ENCOUNTER — Encounter: Payer: Self-pay | Admitting: Internal Medicine

## 2014-09-11 VITALS — BP 139/84 | HR 82 | Wt 133.0 lb

## 2014-09-11 DIAGNOSIS — M810 Age-related osteoporosis without current pathological fracture: Secondary | ICD-10-CM

## 2014-09-11 DIAGNOSIS — E785 Hyperlipidemia, unspecified: Secondary | ICD-10-CM

## 2014-09-11 DIAGNOSIS — C50411 Malignant neoplasm of upper-outer quadrant of right female breast: Secondary | ICD-10-CM

## 2014-09-11 DIAGNOSIS — I10 Essential (primary) hypertension: Secondary | ICD-10-CM

## 2014-09-11 NOTE — Progress Notes (Signed)
    Subjective:    HPI  F/u elev BP lately -- BP was checked on legs, wrists. R wrist BP - see below  She had a breast ca surgery on 07/07/13. Finished XRT in May 2015.  F/u HTN, insomnia, fatigue, UTI  Sleeping better  Ureter bx was ok  BP Readings from Last 3 Encounters:  09/11/14 139/84  07/18/14 141/70  05/10/14 130/80   Wt Readings from Last 3 Encounters:  09/11/14 133 lb (60.328 kg)  07/18/14 132 lb 9.6 oz (60.147 kg)  05/10/14 133 lb (60.328 kg)      Past Medical History  Diagnosis Date  . Hemangioma of intra-abdominal structures   . Renal cyst   . Shingles   . Paresthesia   . Heart murmur   . Dyslipidemia   . GERD (gastroesophageal reflux disease)   . Osteoporosis   . Insomnia   . Depression   . HTN (hypertension)   . LBP (low back pain)   . Anxiety   . Breast cancer 2015    ER+/PR+/Her2-  . Radiation 08/08/13-08/29/13    Bilat.breast 42.72 Gy    Review of Systems  Constitutional: Positive for activity change. Negative for unexpected weight change.  Cardiovascular: Negative for chest pain.  Gastrointestinal: Positive for nausea. Negative for abdominal pain and constipation.  Genitourinary: Negative for dysuria, frequency, flank pain and difficulty urinating.  Musculoskeletal: Negative for myalgias, back pain, gait problem and neck pain.  Psychiatric/Behavioral: Positive for sleep disturbance. The patient is nervous/anxious.        Objective:   Physical Exam BP 139/84 mmHg  Pulse 82  Wt 133 lb (60.328 kg)  SpO2 95%  Constitutional: She appears fatigued but less ill than last week, well-developed and well-nourished. No acute distress. Spouse at side Neck: Normal range of motion. Neck supple. No JVD or LAD present. No thyromegaly present.  Cardiovascular: fast rate but regular rhythm, normal heart sounds.  No murmur heard. No BLE edema. Pulmonary/Chest: Effort normal and breath sounds normal. No respiratory distress. She has no wheezes.   Abdominal: Soft. Bowel sounds are present. She exhibits no distension. There is no tenderness, rebound or gaurding. no masses Neurological: She is alert and oriented to person, place, and time. No cranial nerve deficit. Coordination normal.  Skin: Skin is warm and dry. No rash noted. No erythema.  Psychiatric: pt seem slightly anxious and despondent, but smiles at jokes. her behavior is normal. Judgment and thought content normal.  Scars on B breasts and in B axillas - no lymphedema  Lab Results  Component Value Date   WBC 6.8 07/11/2014   HGB 13.9 07/11/2014   HCT 43.2 07/11/2014   PLT 251 07/11/2014   GLUCOSE 102 07/11/2014   CHOL 240* 02/09/2012   TRIG 151.0* 02/09/2012   HDL 68.90 02/09/2012   LDLDIRECT 144.7 02/09/2012   LDLCALC 96 04/26/2007   ALT 11 07/11/2014   AST 15 07/11/2014   NA 144 07/11/2014   K 3.9 07/11/2014   CL 103 06/30/2013   CREATININE 0.8 07/11/2014   BUN 19.3 07/11/2014   CO2 28 07/11/2014   TSH 1.29 02/09/2012   INR 0.95 01/15/2012   HGBA1C 5.7 01/02/2011      Assessment & Plan:

## 2014-09-11 NOTE — Assessment & Plan Note (Signed)
Dr Jana Hakim On Arimidex

## 2014-09-11 NOTE — Assessment & Plan Note (Addendum)
Chronic BP is hard to test due to h/o B mastectomy - wrist Hyzaar, Amlodipine

## 2014-09-11 NOTE — Progress Notes (Signed)
Pre visit review using our clinic review tool, if applicable. No additional management support is needed unless otherwise documented below in the visit note. 

## 2014-09-11 NOTE — Assessment & Plan Note (Signed)
Vit D 

## 2014-09-12 DIAGNOSIS — C50312 Malignant neoplasm of lower-inner quadrant of left female breast: Secondary | ICD-10-CM | POA: Diagnosis not present

## 2014-09-12 DIAGNOSIS — C50411 Malignant neoplasm of upper-outer quadrant of right female breast: Secondary | ICD-10-CM | POA: Diagnosis not present

## 2014-10-12 ENCOUNTER — Other Ambulatory Visit: Payer: Self-pay | Admitting: Internal Medicine

## 2014-10-26 DIAGNOSIS — D495 Neoplasm of unspecified behavior of other genitourinary organs: Secondary | ICD-10-CM | POA: Diagnosis not present

## 2014-10-26 DIAGNOSIS — N281 Cyst of kidney, acquired: Secondary | ICD-10-CM | POA: Diagnosis not present

## 2014-11-07 ENCOUNTER — Other Ambulatory Visit: Payer: Self-pay | Admitting: Internal Medicine

## 2014-12-04 DIAGNOSIS — H3530 Unspecified macular degeneration: Secondary | ICD-10-CM | POA: Diagnosis not present

## 2014-12-04 DIAGNOSIS — Z961 Presence of intraocular lens: Secondary | ICD-10-CM | POA: Diagnosis not present

## 2014-12-04 DIAGNOSIS — H3531 Nonexudative age-related macular degeneration: Secondary | ICD-10-CM | POA: Diagnosis not present

## 2014-12-13 ENCOUNTER — Encounter: Payer: Self-pay | Admitting: Internal Medicine

## 2014-12-13 ENCOUNTER — Ambulatory Visit (INDEPENDENT_AMBULATORY_CARE_PROVIDER_SITE_OTHER): Payer: Medicare Other | Admitting: Internal Medicine

## 2014-12-13 ENCOUNTER — Other Ambulatory Visit (INDEPENDENT_AMBULATORY_CARE_PROVIDER_SITE_OTHER): Payer: Medicare Other

## 2014-12-13 VITALS — BP 112/68 | HR 75 | Temp 98.1°F | Resp 12 | Ht 61.0 in | Wt 132.0 lb

## 2014-12-13 DIAGNOSIS — C50411 Malignant neoplasm of upper-outer quadrant of right female breast: Secondary | ICD-10-CM | POA: Diagnosis not present

## 2014-12-13 DIAGNOSIS — M810 Age-related osteoporosis without current pathological fracture: Secondary | ICD-10-CM | POA: Diagnosis not present

## 2014-12-13 DIAGNOSIS — I10 Essential (primary) hypertension: Secondary | ICD-10-CM

## 2014-12-13 DIAGNOSIS — E785 Hyperlipidemia, unspecified: Secondary | ICD-10-CM | POA: Diagnosis not present

## 2014-12-13 DIAGNOSIS — R413 Other amnesia: Secondary | ICD-10-CM | POA: Diagnosis not present

## 2014-12-13 LAB — LIPID PANEL
Cholesterol: 251 mg/dL — ABNORMAL HIGH (ref 0–200)
HDL: 61.7 mg/dL (ref >39.00)
LDL Cholesterol: 160 mg/dL — ABNORMAL HIGH (ref 0–99)
NonHDL: 189.3
Total CHOL/HDL Ratio: 4
Triglycerides: 145 mg/dL (ref 0.0–149.0)
VLDL: 29 mg/dL (ref 0.0–40.0)

## 2014-12-13 LAB — BASIC METABOLIC PANEL
BUN: 19 mg/dL (ref 6–23)
CO2: 29 mEq/L (ref 19–32)
Calcium: 9.6 mg/dL (ref 8.4–10.5)
Chloride: 102 mEq/L (ref 96–112)
Creatinine, Ser: 0.89 mg/dL (ref 0.40–1.20)
GFR: 64.88 mL/min (ref >60.00)
GLUCOSE: 98 mg/dL (ref 70–99)
POTASSIUM: 3.5 meq/L (ref 3.5–5.1)
SODIUM: 141 meq/L (ref 135–145)

## 2014-12-13 LAB — HEPATIC FUNCTION PANEL
ALBUMIN: 4.4 g/dL (ref 3.5–5.2)
ALT: 8 U/L (ref 0–35)
AST: 14 U/L (ref 0–37)
Alkaline Phosphatase: 62 U/L (ref 39–117)
BILIRUBIN DIRECT: 0.1 mg/dL (ref 0.0–0.3)
TOTAL PROTEIN: 7.1 g/dL (ref 6.0–8.3)
Total Bilirubin: 0.6 mg/dL (ref 0.2–1.2)

## 2014-12-13 LAB — TSH: TSH: 1.5 u[IU]/mL (ref 0.35–4.50)

## 2014-12-13 MED ORDER — PLANT STEROLS AND STANOLS 450 MG PO TABS: ORAL_TABLET | ORAL | Status: DC

## 2014-12-13 NOTE — Assessment & Plan Note (Signed)
Doing well 

## 2014-12-13 NOTE — Progress Notes (Signed)
Pre visit review using our clinic review tool, if applicable. No additional management support is needed unless otherwise documented below in the visit note. 

## 2014-12-13 NOTE — Progress Notes (Signed)
Subjective:  Patient ID: Cynthia Garza, female    DOB: 06-20-1932  Age: 81 y.o. MRN: [id number]  CC: No chief complaint on file.   HPI Cynthia Garza presents for HTN, depression, B12 def  Outpatient Prescriptions Prior to Visit  Medication Sig Dispense Refill  . amLODipine (NORVASC) 5 MG tablet Take 1 tablet (5 mg total) by mouth daily. 90 tablet 3  . anastrozole (ARIMIDEX) 1 MG tablet TAKE 1 TABLET (1 MG TOTAL) BY MOUTH DAILY. 90 tablet 3  . aspirin 81 MG tablet Take 81 mg by mouth daily.    Marland Kitchen buPROPion (WELLBUTRIN XL) 150 MG 24 hr tablet TAKE 1 TABLET (150 MG TOTAL) BY MOUTH DAILY. 90 tablet 3  . Carboxymethylcellul-Glycerin (OPTIVE) 0.5-0.9 % SOLN Place 1-2 drops into both eyes at bedtime. For dry eyes    . losartan-hydrochlorothiazide (HYZAAR) 100-25 MG per tablet TAKE 1 TABLET BY MOUTH DAILY. 90 tablet 3  . Multiple Vitamins-Minerals (ALIVE ONCE DAILY WOMENS 50+) TABS Take 1 tablet by mouth daily.    . ranitidine (ZANTAC) 150 MG tablet Take 150 mg by mouth 2 (two) times daily.    . vitamin B-12 (CYANOCOBALAMIN) 1000 MCG tablet Take 1,000 mcg by mouth daily.    Marland Kitchen zolpidem (AMBIEN) 10 MG tablet TAKE 1 TABLET BY MOUTH AT BEDTIME AS NEEDED FOR SLEEP 90 tablet 0   Facility-Administered Medications Prior to Visit  Medication Dose Route Frequency Provider Last Rate Last Dose  . methylPREDNISolone acetate (DEPO-MEDROL) injection 80 mg  80 mg Intra-articular Once Aleksei Plotnikov V, MD        ROS Review of Systems  Constitutional: Negative for chills, activity change, appetite change, fatigue and unexpected weight change.  HENT: Negative for congestion, mouth sores and sinus pressure.   Eyes: Negative for visual disturbance.  Respiratory: Negative for cough, chest tightness and wheezing.   Cardiovascular: Negative for chest pain.  Gastrointestinal: Negative for nausea and abdominal pain.  Genitourinary: Negative for frequency, difficulty urinating and vaginal pain.    Musculoskeletal: Negative for back pain and gait problem.  Skin: Negative for pallor and rash.  Neurological: Negative for dizziness, tremors, weakness, numbness and headaches.  Psychiatric/Behavioral: Negative for confusion and sleep disturbance.    Objective:  BP 112/68 mmHg  Pulse 75  Temp(Src) 98.1 F (36.7 C) (Oral)  Resp 12  Ht 5\' 1"  (1.549 m)  Wt 132 lb (59.875 kg)  BMI 24.95 kg/m2  SpO2 97%  BP Readings from Last 3 Encounters:  12/13/14 112/68  09/11/14 139/84  07/18/14 141/70    Wt Readings from Last 3 Encounters:  12/13/14 132 lb (59.875 kg)  09/11/14 133 lb (60.328 kg)  07/18/14 132 lb 9.6 oz (60.147 kg)    Physical Exam  Constitutional: She appears well-developed. No distress.  HENT:  Head: Normocephalic.  Right Ear: External ear normal.  Left Ear: External ear normal.  Nose: Nose normal.  Mouth/Throat: Oropharynx is clear and moist.  Eyes: Conjunctivae are normal. Pupils are equal, round, and reactive to light. Right eye exhibits no discharge. Left eye exhibits no discharge.  Neck: Normal range of motion. Neck supple. No JVD present. No tracheal deviation present. No thyromegaly present.  Cardiovascular: Normal rate, regular rhythm and normal heart sounds.   Pulmonary/Chest: No stridor. No respiratory distress. She has no wheezes.  Abdominal: Soft. Bowel sounds are normal. She exhibits no distension and no mass. There is no tenderness. There is no rebound and no guarding.  Musculoskeletal: She exhibits no edema or  tenderness.  Lymphadenopathy:    She has no cervical adenopathy.  Neurological: She displays normal reflexes. No cranial nerve deficit. She exhibits normal muscle tone. Coordination normal.  Skin: No rash noted. No erythema.  Psychiatric: She has a normal mood and affect. Her behavior is normal. Judgment and thought content normal.    Lab Results  Component Value Date   WBC 6.8 07/11/2014   HGB 13.9 07/11/2014   HCT 43.2 07/11/2014   PLT  251 07/11/2014   GLUCOSE 98 12/13/2014   CHOL 251* 12/13/2014   TRIG 145.0 12/13/2014   HDL 61.70 12/13/2014   LDLDIRECT 144.7 02/09/2012   LDLCALC 160* 12/13/2014   ALT 8 12/13/2014   AST 14 12/13/2014   NA 141 12/13/2014   K 3.5 12/13/2014   CL 102 12/13/2014   CREATININE 0.89 12/13/2014   BUN 19 12/13/2014   CO2 29 12/13/2014   TSH 1.50 12/13/2014   INR 0.95 01/15/2012   HGBA1C 5.7 01/02/2011    No results found.  Assessment & Plan:   Diagnoses and all orders for this visit:  Essential hypertension  Dyslipidemia  Memory problem  Other orders -     Plant Sterols and Stanols 450 MG TABS; 1po qd  I am having Ms. Wilborn start on Plant Sterols and Stanols. I am also having her maintain her ranitidine, Carboxymethylcellul-Glycerin, aspirin, vitamin B-12, ALIVE ONCE DAILY WOMENS 50+, buPROPion, zolpidem, anastrozole, amLODipine, and losartan-hydrochlorothiazide. We will continue to administer methylPREDNISolone acetate.  Meds ordered this encounter  Medications  . Plant Sterols and Stanols 450 MG TABS    Sig: 1po qd    Dispense:  100 tablet    Refill:  3     Follow-up: Return in about 6 months (around 06/15/2015) for a follow-up visit.  Walker Kehr, MD

## 2014-12-13 NOTE — Assessment & Plan Note (Signed)
Low fat diet

## 2014-12-13 NOTE — Assessment & Plan Note (Signed)
Hyzaar, Amlodipine

## 2014-12-22 ENCOUNTER — Telehealth: Payer: Self-pay | Admitting: Internal Medicine

## 2014-12-22 MED ORDER — BUPROPION HCL ER (XL) 150 MG PO TB24: 150.0000 mg | ORAL_TABLET | Freq: Every day | ORAL | Status: DC

## 2014-12-22 NOTE — Telephone Encounter (Signed)
Done. See meds.  

## 2014-12-22 NOTE — Telephone Encounter (Signed)
Pt called in and needs refill on her buPROPion (WELLBUTRIN XL) 150 MG 24 hr tablet [[id number]]      [facility] on [address]

## 2015-01-09 ENCOUNTER — Other Ambulatory Visit: Payer: Self-pay | Admitting: *Deleted

## 2015-01-09 DIAGNOSIS — C50411 Malignant neoplasm of upper-outer quadrant of right female breast: Secondary | ICD-10-CM

## 2015-01-10 ENCOUNTER — Other Ambulatory Visit (HOSPITAL_BASED_OUTPATIENT_CLINIC_OR_DEPARTMENT_OTHER): Payer: Medicare Other

## 2015-01-10 DIAGNOSIS — C50811 Malignant neoplasm of overlapping sites of right female breast: Secondary | ICD-10-CM | POA: Diagnosis present

## 2015-01-10 DIAGNOSIS — C50512 Malignant neoplasm of lower-outer quadrant of left female breast: Secondary | ICD-10-CM

## 2015-01-10 DIAGNOSIS — C50411 Malignant neoplasm of upper-outer quadrant of right female breast: Secondary | ICD-10-CM

## 2015-01-10 LAB — CBC WITH DIFFERENTIAL/PLATELET
BASO%: 0.8 % (ref 0.0–2.0)
Basophils Absolute: 0.1 10*3/uL (ref 0.0–0.1)
EOS ABS: 0.2 10*3/uL (ref 0.0–0.5)
EOS%: 2.4 % (ref 0.0–7.0)
HCT: 40.4 % (ref 34.8–46.6)
HGB: 13.6 g/dL (ref 11.6–15.9)
LYMPH%: 28.9 % (ref 14.0–49.7)
MCH: 28.1 pg (ref 25.1–34.0)
MCHC: 33.7 g/dL (ref 31.5–36.0)
MCV: 83.4 fL (ref 79.5–101.0)
MONO#: 0.9 10*3/uL (ref 0.1–0.9)
MONO%: 12.2 % (ref 0.0–14.0)
NEUT#: 3.9 10*3/uL (ref 1.5–6.5)
NEUT%: 55.7 % (ref 38.4–76.8)
Platelets: 225 10*3/uL (ref 145–400)
RBC: 4.85 10*6/uL (ref 3.70–5.45)
RDW: 13.7 % (ref 11.2–14.5)
WBC: 7 10*3/uL (ref 3.9–10.3)
lymph#: 2 10*3/uL (ref 0.9–3.3)

## 2015-01-10 LAB — COMPREHENSIVE METABOLIC PANEL (CC13)
ALT: 8 U/L (ref 0–55)
ANION GAP: 11 meq/L (ref 3–11)
AST: 13 U/L (ref 5–34)
Albumin: 4 g/dL (ref 3.5–5.0)
Alkaline Phosphatase: 67 U/L (ref 40–150)
BILIRUBIN TOTAL: 0.49 mg/dL (ref 0.20–1.20)
BUN: 16.3 mg/dL (ref 7.0–26.0)
CO2: 26 meq/L (ref 22–29)
Calcium: 9.7 mg/dL (ref 8.4–10.4)
Chloride: 105 mEq/L (ref 98–109)
Creatinine: 0.8 mg/dL (ref 0.6–1.1)
EGFR: 67 mL/min/{1.73_m2} — AB (ref >90)
GLUCOSE: 96 mg/dL (ref 70–140)
POTASSIUM: 3.5 meq/L (ref 3.5–5.1)
Sodium: 142 mEq/L (ref 136–145)
TOTAL PROTEIN: 6.8 g/dL (ref 6.4–8.3)

## 2015-01-16 ENCOUNTER — Encounter: Payer: Self-pay | Admitting: Nurse Practitioner

## 2015-01-16 ENCOUNTER — Ambulatory Visit (HOSPITAL_BASED_OUTPATIENT_CLINIC_OR_DEPARTMENT_OTHER): Payer: Medicare Other | Admitting: Nurse Practitioner

## 2015-01-16 VITALS — BP 145/77 | HR 76 | Temp 97.5°F | Resp 18 | Ht 61.0 in | Wt 132.0 lb

## 2015-01-16 DIAGNOSIS — C50312 Malignant neoplasm of lower-inner quadrant of left female breast: Secondary | ICD-10-CM | POA: Diagnosis not present

## 2015-01-16 DIAGNOSIS — Z79811 Long term (current) use of aromatase inhibitors: Secondary | ICD-10-CM

## 2015-01-16 DIAGNOSIS — C50811 Malignant neoplasm of overlapping sites of right female breast: Secondary | ICD-10-CM | POA: Diagnosis not present

## 2015-01-16 DIAGNOSIS — C50411 Malignant neoplasm of upper-outer quadrant of right female breast: Secondary | ICD-10-CM

## 2015-01-16 DIAGNOSIS — Z170 Estrogen receptor positive status [ER+]: Secondary | ICD-10-CM

## 2015-01-16 NOTE — Progress Notes (Signed)
[place]  [phone] Fax:([phone]) [phone]     ID: [name] OB: Mar 12, 1933  MR#: [id number]  MHD#:[id number]  PCP: Walker Kehr, MD GYN:   SU: Autumn Messing OTHER MD: Thea Silversmith  CHIEF COMPLAINT: estrogen receptor positive breast cancer CURRENT TREATMENT: On antiestrogen therapy  BREAST CANCER HISTORY: From Dr.Khan's 06/08/2013 note:  "1. [The patient] underwent a six-month followup mammogram for left-sided abnormalities. On her mammogram she was noted to have a right breast mass at the 12:00 position. By ultrasound it measured 1.5 cm. Patient went on to have a biopsy performed of this mass. This revealed intermediate grade invasive ductal carcinoma ER positive PR positive HER-2/neu negative with a proliferation marker Ki-67 22%. She had MRI of the breasts performed. The MRI showed a 1.3 cm enhancement known for the by biopsy with 2 small satellite lesion. She was also noted to have a another linear enhancement as well in the right breast. On the left side she was noted to have a 7 mm mass by ultrasound this measured 8 mm this was at the 7:00 position. Patient had a biopsy of this performed. By pathology report report this is consistent with an invasive cancer.   2. status post lumpectomy performed on 07/07/2013 of the left breast. The final pathology revealed a 0.7 cm invasive ductal carcinoma with ductal carcinoma in situ. Sentinel lymph node was negative for metastatic disease. Tumor was ER positive PR positive HER-2/neu negative with a proliferation marker Ki-67 13%. Stage I (T1 N0)  #3 patient had Oncotype DX testing performed her breast cancer recurrence score was 0 giving her a 3% risk of distant recurrence with tamoxifen for 5 years. Patient will not need chemotherapy but will need antiestrogen therapy"  Her subsequent history is as detailed below.  INTERVAL HISTORY: Maurie returns today for follow up of her breast cancer, alone. She has  been on anastrozole since May 2015 and tolerates this remarkably well. She denies hot flashes and vaginal changes. She has some joint stiffness when she first wakes up, but she is inclined to believe that this is combination of age, antiestrogen therapy, and statin therapy. She was recently taken off statins and is now on an OTC med called cholest-off, as suggested by her PCP.   REVIEW OF SYSTEMS: Angelo denies fevers, chills, nausea, vomiting, or changes in bowel or bladder habits. She has some pain and fullness to her right axilla since surgery, that has been stable since post op. She denies shortness of breath, chest pain, cough, or palpitations. She has no headaches, dizziness, vision changes, or weakness. A detailed review of systems is otherwise stable.  PAST MEDICAL HISTORY: Past Medical History  Diagnosis Date  . Hemangioma of intra-abdominal structures   . Renal cyst   . Shingles   . Paresthesia   . Heart murmur   . Dyslipidemia   . GERD (gastroesophageal reflux disease)   . Osteoporosis   . Insomnia   . Depression   . HTN (hypertension)   . LBP (low back pain)   . Anxiety   . Breast cancer 2015    ER+/PR+/Her2-  . Radiation 08/08/13-08/29/13    Bilat.breast 42.72 Gy    PAST SURGICAL HISTORY: Past Surgical History  Procedure Laterality Date  . Abdominal hysterectomy      ?1970's  . Breast lumpectomy with needle localization and axillary sentinel lymph node bx Bilateral 07/07/2013    Procedure: BREAST LUMPECTOMY WITH NEEDLE LOCALIZATION AND AXILLARY SENTINEL LYMPH NODE BIOPIES;  Surgeon: Merrie Roof, MD;  Location: [facility] OR;  Service: General;  Laterality: Bilateral;    FAMILY HISTORY Family History  Problem Relation Age of Onset  . Kidney failure Mother   . Hypertension Father   . Esophageal cancer Father   . Coronary artery disease Neg Hx     GYNECOLOGIC HISTORY:  Menarche age 31, first live birth age 79, she is [place] P2. She underwent menopause in the late 1970s.  She did not take hormone replacement.  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 Her husband [name] used to work in Actuary. The patient's daughter [name] used to be a Education officer, museum but now works in Engineer, mining as. Daughter [name] is a homemaker. The patient has 4 grandchildren. She attends a local [employer]: [employer]   HEALTH MAINTENANCE: Social History  Substance Use Topics  . Smoking status: Never Smoker   . Smokeless tobacco: Never Used  . Alcohol Use: No     Colonoscopy:  PAP:  Bone density:  Lipid panel:  Allergies  Allergen Reactions  . Simvastatin Other (See Comments)    aches  . Diflucan [Fluconazole] Rash    Current Outpatient Prescriptions  Medication Sig Dispense Refill  . amLODipine (NORVASC) 5 MG tablet Take 1 tablet (5 mg total) by mouth daily. 90 tablet 3  . anastrozole (ARIMIDEX) 1 MG tablet TAKE 1 TABLET (1 MG TOTAL) BY MOUTH DAILY. 90 tablet 3  . aspirin 81 MG tablet Take 81 mg by mouth daily.    Marland Kitchen buPROPion (WELLBUTRIN XL) 150 MG 24 hr tablet Take 1 tablet (150 mg total) by mouth daily. 90 tablet 3  . Carboxymethylcellul-Glycerin (OPTIVE) 0.5-0.9 % SOLN Place 1-2 drops into both eyes at bedtime. For dry eyes    . losartan-hydrochlorothiazide (HYZAAR) 100-25 MG per tablet TAKE 1 TABLET BY MOUTH DAILY. 90 tablet 3  . Multiple Vitamins-Minerals (ALIVE ONCE DAILY WOMENS 50+) TABS Take 1 tablet by mouth daily.    . ranitidine (ZANTAC) 150 MG tablet Take 150 mg by mouth 2 (two) times daily.    Marland Kitchen UNABLE TO FIND Take 2 tablets by mouth 2 (two) times daily. Cholest- Off    . vitamin B-12 (CYANOCOBALAMIN) 1000 MCG tablet Take 1,000 mcg by mouth daily.    Marland Kitchen zolpidem (AMBIEN) 10 MG tablet TAKE 1 TABLET BY MOUTH AT BEDTIME AS NEEDED FOR SLEEP 90 tablet 0   Current Facility-Administered Medications  Medication Dose Route Frequency Provider Last Rate Last Dose  .  methylPREDNISolone acetate (DEPO-MEDROL) injection 80 mg  80 mg Intra-articular Once Aleksei Plotnikov V, MD        OBJECTIVE: Older white woman In no acute distress Filed Vitals:   01/16/15 1334  BP: 145/77  Pulse: 76  Temp: 97.5 F (36.4 C)  Resp: 18     Body mass index is 24.95 kg/(m^2).    ECOG FS:0 - Asymptomatic   Skin: warm, dry  HEENT: sclerae anicteric, conjunctivae pink, oropharynx clear. No thrush or mucositis.  Lymph Nodes: No cervical or supraclavicular lymphadenopathy  Lungs: clear to auscultation bilaterally, no rales, wheezes, or rhonci  Heart: regular rate and rhythm  Abdomen: round, soft, non tender, positive bowel sounds  Musculoskeletal: No focal spinal tenderness, no peripheral edema  Neuro: non focal, well oriented, positive affect  Breasts: bilateral breasts status post lumpectomies and radiation. No evidence of recurrent disease. Bilateral axillae benign.  LAB RESULTS:  CMP     Component Value Date/Time   NA 142 01/10/2015 1352   NA 141 12/13/2014 1104   K 3.5 01/10/2015 1352   K 3.5 12/13/2014 1104   CL 102 12/13/2014 1104   CO2 26 01/10/2015 1352   CO2 29 12/13/2014 1104   GLUCOSE 96 01/10/2015 1352   GLUCOSE 98 12/13/2014 1104   BUN 16.3 01/10/2015 1352   BUN 19 12/13/2014 1104   CREATININE 0.8 01/10/2015 1352   CREATININE 0.89 12/13/2014 1104   CALCIUM 9.7 01/10/2015 1352   CALCIUM 9.6 12/13/2014 1104   PROT 6.8 01/10/2015 1352   PROT 7.1 12/13/2014 1104   ALBUMIN 4.0 01/10/2015 1352   ALBUMIN 4.4 12/13/2014 1104   AST 13 01/10/2015 1352   AST 14 12/13/2014 1104   ALT 8 01/10/2015 1352   ALT 8 12/13/2014 1104   ALKPHOS 67 01/10/2015 1352   ALKPHOS 62 12/13/2014 1104   BILITOT 0.49 01/10/2015 1352   BILITOT 0.6 12/13/2014 1104   GFRNONAA 62* 06/30/2013 1421   GFRAA 72* 06/30/2013 1421    I No results found for: SPEP  Lab Results  Component Value Date   WBC 7.0 01/10/2015   NEUTROABS 3.9 01/10/2015   HGB 13.6 01/10/2015    HCT 40.4 01/10/2015   MCV 83.4 01/10/2015   PLT 225 01/10/2015      Chemistry      Component Value Date/Time   NA 142 01/10/2015 1352   NA 141 12/13/2014 1104   K 3.5 01/10/2015 1352   K 3.5 12/13/2014 1104   CL 102 12/13/2014 1104   CO2 26 01/10/2015 1352   CO2 29 12/13/2014 1104   BUN 16.3 01/10/2015 1352   BUN 19 12/13/2014 1104   CREATININE 0.8 01/10/2015 1352   CREATININE 0.89 12/13/2014 1104      Component Value Date/Time   CALCIUM 9.7 01/10/2015 1352   CALCIUM 9.6 12/13/2014 1104   ALKPHOS 67 01/10/2015 1352   ALKPHOS 62 12/13/2014 1104   AST 13 01/10/2015 1352   AST 14 12/13/2014 1104   ALT 8 01/10/2015 1352   ALT 8 12/13/2014 1104   BILITOT 0.49 01/10/2015 1352   BILITOT 0.6 12/13/2014 1104       No results found for: LABCA2  No components found for: LABCA125  No results for input(s): INR in the last 168 hours.  Urinalysis    Component Value Date/Time   COLORURINE LT. YELLOW 02/09/2012 1621   APPEARANCEUR CLEAR 02/09/2012 1621   LABSPEC <=1.005 02/09/2012 1621   PHURINE 6.5 02/09/2012 1621   GLUCOSEU NEGATIVE 02/09/2012 1621   GLUCOSEU NEGATIVE 12/04/2011 1640   HGBUR NEGATIVE 02/09/2012 1621   BILIRUBINUR NEGATIVE 02/09/2012 1621   BILIRUBINUR neg 12/03/2011 1540   KETONESUR NEGATIVE 02/09/2012 1621   PROTEINUR 100* 12/04/2011 1640   PROTEINUR positive 12/03/2011 1540   UROBILINOGEN 0.2 02/09/2012 1621   UROBILINOGEN 0.2 12/03/2011 1540   NITRITE NEGATIVE 02/09/2012 1621   NITRITE neg 12/03/2011 1540   LEUKOCYTESUR TRACE 02/09/2012 1621    STUDIES: Bone density obtained at [facility] 07/12/2014 showed a T score of -0.9 (normal). Bilateral diagnostic mammography the same day was unremarkable  ASSESSMENT: 81 y.o. [place] woman with bilateral breast cancers  (1) RIGHT BREAST: Status post right lumpectomy and right axillary lymph node sampling 07/07/2013 for a pT1c pN0, stage IA invasive ductal carcinoma, grade 2, estrogen receptor 100%  positive, progesterone receptor 97% positive, with an MIB-1 of 22% and no HER-2 amplification  (a)  Oncotype DX score of 0 predicts an outside the breast recurrence of 3% within 10 years if the patient's only systemic treatment is tamoxifen for 5 years  (b) adjuvant radiation completed 08/29/2013  (2) LEFT BREAST: Status post left lumpectomy 07/07/2013 for a pT1b pN0, stage IA invasive ductal carcinoma, grade 2, estrogen receptor 100% positive, progesterone receptor 54% positive, with an MIB-1 of 13% and no HER-2 amplification  (b) Completed adjuvant radiation 08/29/2013  (3) SYSTEMIC THERAPY:   (a) started on anastrozole 09/23/2013   (i) bone density at [facility] 07/12/2014 was normal with a T score of -0.9.  PLAN: Pedro is doing well as far as her breast cancer is concerned. She is now 1.5 years out from her definitive surgery with no evidence of recurrent diease. The labs were reviewed in detail and were entirely stable. She is tolerating the anastrozole well with few complaints, and is expected to complete 5 years of antiestrogen therapy on this drug.   Enid Derry meets with Dr. Marlou Starks this fall, and will see Dr. Jana Hakim in late February after her repeat mammogram. She understands and agrees with this plan. She knows the goal of treatment in her case is cure. She has been encouraged to call with any issues that might arise before her next visit here.   Laurie Panda, NP   01/16/2015 2:10 PM

## 2015-03-06 DIAGNOSIS — C50312 Malignant neoplasm of lower-inner quadrant of left female breast: Secondary | ICD-10-CM | POA: Diagnosis not present

## 2015-03-06 DIAGNOSIS — C50411 Malignant neoplasm of upper-outer quadrant of right female breast: Secondary | ICD-10-CM | POA: Diagnosis not present

## 2015-04-09 ENCOUNTER — Other Ambulatory Visit: Payer: Self-pay | Admitting: *Deleted

## 2015-04-09 MED ORDER — ZOLPIDEM TARTRATE 10 MG PO TABS: 10.0000 mg | ORAL_TABLET | Freq: Every evening | ORAL | Status: DC | PRN

## 2015-04-09 NOTE — Telephone Encounter (Signed)
Rf req for Zolpidem 10 mg . Ok to Rf? See pended med

## 2015-04-09 NOTE — Telephone Encounter (Signed)
MD ok refill call into pharmacy had to leave on pharmacist vm...[name]

## 2015-05-23 ENCOUNTER — Telehealth: Payer: Self-pay | Admitting: Internal Medicine

## 2015-05-23 NOTE — Telephone Encounter (Signed)
Nurse visit scheduled.

## 2015-05-23 NOTE — Telephone Encounter (Signed)
Yes, OR go to ER if needed sooner. Dr. Alain Marion will be out if the office tomorrow, but she can schedule a nurse visit for BP check.

## 2015-05-23 NOTE — Telephone Encounter (Signed)
Pt feels she needs her bp checked and wants to come in tomorrow for a nurses visit.  May I schedule her for this?

## 2015-05-24 ENCOUNTER — Ambulatory Visit: Payer: Medicare Other

## 2015-05-24 VITALS — BP 138/78

## 2015-05-24 DIAGNOSIS — I10 Essential (primary) hypertension: Secondary | ICD-10-CM

## 2015-06-15 ENCOUNTER — Telehealth: Payer: Self-pay | Admitting: Internal Medicine

## 2015-06-15 ENCOUNTER — Other Ambulatory Visit (INDEPENDENT_AMBULATORY_CARE_PROVIDER_SITE_OTHER): Payer: Medicare Other

## 2015-06-15 ENCOUNTER — Encounter: Payer: Self-pay | Admitting: Internal Medicine

## 2015-06-15 ENCOUNTER — Ambulatory Visit (INDEPENDENT_AMBULATORY_CARE_PROVIDER_SITE_OTHER): Payer: Medicare Other | Admitting: Internal Medicine

## 2015-06-15 VITALS — BP 130/80 | HR 75 | Ht 61.0 in | Wt 131.0 lb

## 2015-06-15 DIAGNOSIS — F329 Major depressive disorder, single episode, unspecified: Secondary | ICD-10-CM | POA: Diagnosis not present

## 2015-06-15 DIAGNOSIS — I10 Essential (primary) hypertension: Secondary | ICD-10-CM

## 2015-06-15 DIAGNOSIS — E876 Hypokalemia: Secondary | ICD-10-CM | POA: Diagnosis not present

## 2015-06-15 DIAGNOSIS — G4700 Insomnia, unspecified: Secondary | ICD-10-CM | POA: Diagnosis not present

## 2015-06-15 DIAGNOSIS — F32A Depression, unspecified: Secondary | ICD-10-CM

## 2015-06-15 LAB — BASIC METABOLIC PANEL
BUN: 19 mg/dL (ref 6–23)
CHLORIDE: 102 meq/L (ref 96–112)
CO2: 29 mEq/L (ref 19–32)
CREATININE: 0.81 mg/dL (ref 0.40–1.20)
Calcium: 9.2 mg/dL (ref 8.4–10.5)
GFR: 72.24 mL/min (ref >60.00)
GLUCOSE: 100 mg/dL — AB (ref 70–99)
Potassium: 3.4 mEq/L — ABNORMAL LOW (ref 3.5–5.1)
Sodium: 140 mEq/L (ref 135–145)

## 2015-06-15 NOTE — Progress Notes (Signed)
Pre visit review using our clinic review tool, if applicable. No additional management support is needed unless otherwise documented below in the visit note. 

## 2015-06-15 NOTE — Assessment & Plan Note (Signed)
Chronic Zolpidem  Potential benefits of a long term benzodiazepines  use as well as potential risks  and complications were explained to the patient and were aknowledged. 

## 2015-06-15 NOTE — Assessment & Plan Note (Signed)
Hyzaar, Amlodipine I re-checked BP

## 2015-06-15 NOTE — Telephone Encounter (Signed)
Please call patient to schedule 6 month wellness.  Cynthia Garza patient.

## 2015-06-15 NOTE — Assessment & Plan Note (Signed)
Doing fair 

## 2015-06-15 NOTE — Progress Notes (Signed)
Subjective:  Patient ID: Cynthia Garza, female    DOB: 1933/04/18  Age: 82 y.o. MRN: [id number]  CC: No chief complaint on file.   HPI Cynthia Garza presents for HTN, insomnia, B12 def f/u. Pt was unable to tol statins  Outpatient Prescriptions Prior to Visit  Medication Sig Dispense Refill  . amLODipine (NORVASC) 5 MG tablet Take 1 tablet (5 mg total) by mouth daily. 90 tablet 3  . anastrozole (ARIMIDEX) 1 MG tablet TAKE 1 TABLET (1 MG TOTAL) BY MOUTH DAILY. 90 tablet 3  . aspirin 81 MG tablet Take 81 mg by mouth daily.    Marland Kitchen buPROPion (WELLBUTRIN XL) 150 MG 24 hr tablet Take 1 tablet (150 mg total) by mouth daily. 90 tablet 3  . Carboxymethylcellul-Glycerin (OPTIVE) 0.5-0.9 % SOLN Place 1-2 drops into both eyes at bedtime. For dry eyes    . losartan-hydrochlorothiazide (HYZAAR) 100-25 MG per tablet TAKE 1 TABLET BY MOUTH DAILY. 90 tablet 3  . Multiple Vitamins-Minerals (ALIVE ONCE DAILY WOMENS 50+) TABS Take 1 tablet by mouth daily.    . ranitidine (ZANTAC) 150 MG tablet Take 150 mg by mouth 2 (two) times daily.    . vitamin B-12 (CYANOCOBALAMIN) 1000 MCG tablet Take 1,000 mcg by mouth daily.    Marland Kitchen zolpidem (AMBIEN) 10 MG tablet Take 1 tablet (10 mg total) by mouth at bedtime as needed. for sleep 90 tablet 1  . UNABLE TO FIND Take 2 tablets by mouth 2 (two) times daily. Cholest- Off     Facility-Administered Medications Prior to Visit  Medication Dose Route Frequency Provider Last Rate Last Dose  . methylPREDNISolone acetate (DEPO-MEDROL) injection 80 mg  80 mg Intra-articular Once Aleksei Plotnikov V, MD        ROS Review of Systems  Constitutional: Positive for fatigue. Negative for chills, activity change, appetite change and unexpected weight change.  HENT: Negative for congestion, mouth sores and sinus pressure.   Eyes: Negative for visual disturbance.  Respiratory: Negative for cough and chest tightness.   Gastrointestinal: Negative for nausea and abdominal pain.    Genitourinary: Negative for frequency, difficulty urinating and vaginal pain.  Musculoskeletal: Negative for back pain and gait problem.  Skin: Negative for pallor and rash.  Neurological: Negative for dizziness, tremors, weakness, numbness and headaches.  Psychiatric/Behavioral: Negative for confusion and sleep disturbance.    Objective:  BP 130/80 mmHg  Pulse 75  Ht 5\' 1"  (1.549 m)  Wt 131 lb (59.421 kg)  BMI 24.76 kg/m2  SpO2 97%  BP Readings from Last 3 Encounters:  06/15/15 130/80  05/24/15 138/78  01/16/15 145/77    Wt Readings from Last 3 Encounters:  06/15/15 131 lb (59.421 kg)  01/16/15 132 lb (59.875 kg)  12/13/14 132 lb (59.875 kg)    Physical Exam  Constitutional: She appears well-developed. No distress.  HENT:  Head: Normocephalic.  Right Ear: External ear normal.  Left Ear: External ear normal.  Nose: Nose normal.  Mouth/Throat: Oropharynx is clear and moist.  Eyes: Conjunctivae are normal. Pupils are equal, round, and reactive to light. Right eye exhibits no discharge. Left eye exhibits no discharge.  Neck: Normal range of motion. Neck supple. No JVD present. No tracheal deviation present. No thyromegaly present.  Cardiovascular: Normal rate, regular rhythm and normal heart sounds.   Pulmonary/Chest: No stridor. No respiratory distress. She has no wheezes.  Abdominal: Soft. Bowel sounds are normal. She exhibits no distension and no mass. There is no tenderness. There is no  rebound and no guarding.  Musculoskeletal: She exhibits no edema or tenderness.  Lymphadenopathy:    She has no cervical adenopathy.  Neurological: She displays normal reflexes. No cranial nerve deficit. She exhibits normal muscle tone. Coordination normal.  Skin: No rash noted. No erythema.  Psychiatric: She has a normal mood and affect. Her behavior is normal. Judgment and thought content normal.    Lab Results  Component Value Date   WBC 7.0 01/10/2015   HGB 13.6 01/10/2015    HCT 40.4 01/10/2015   PLT 225 01/10/2015   GLUCOSE 100* 06/15/2015   CHOL 251* 12/13/2014   TRIG 145.0 12/13/2014   HDL 61.70 12/13/2014   LDLDIRECT 144.7 02/09/2012   LDLCALC 160* 12/13/2014   ALT 8 01/10/2015   AST 13 01/10/2015   NA 140 06/15/2015   K 3.4* 06/15/2015   CL 102 06/15/2015   CREATININE 0.81 06/15/2015   BUN 19 06/15/2015   CO2 29 06/15/2015   TSH 1.50 12/13/2014   INR 0.95 01/15/2012   HGBA1C 5.7 01/02/2011    No results found.  Assessment & Plan:   Diagnoses and all orders for this visit:  Essential hypertension -     Basic metabolic panel; Future  Depression  Insomnia  Hypokalemia   I have discontinued Ms. Ullmer UNABLE TO FIND. I am also having her maintain her ranitidine, Carboxymethylcellul-Glycerin, aspirin, vitamin B-12, ALIVE ONCE DAILY WOMENS 50+, anastrozole, amLODipine, losartan-hydrochlorothiazide, buPROPion, and zolpidem. We will continue to administer methylPREDNISolone acetate.  No orders of the defined types were placed in this encounter.     Follow-up: Return in about 6 months (around 12/13/2015) for Wellness Exam.  Walker Kehr, MD

## 2015-06-17 ENCOUNTER — Other Ambulatory Visit: Payer: Self-pay | Admitting: Internal Medicine

## 2015-06-17 DIAGNOSIS — E876 Hypokalemia: Secondary | ICD-10-CM | POA: Insufficient documentation

## 2015-06-17 MED ORDER — POTASSIUM CHLORIDE ER 8 MEQ PO TBCR: 8.0000 meq | EXTENDED_RELEASE_TABLET | Freq: Every day | ORAL | Status: DC

## 2015-06-17 NOTE — Assessment & Plan Note (Signed)
Start KCl

## 2015-06-21 ENCOUNTER — Telehealth: Payer: Self-pay

## 2015-06-21 NOTE — Telephone Encounter (Signed)
Call to schedule apt for AWV per Plotnikov; Scheduled for 7/17 at 9am; will have labs drawn in the am prior to apt. Will the schedule with Dr. Alain Marion; generally takes Vacation at the end of June and 1st of July.

## 2015-06-21 NOTE — Telephone Encounter (Signed)
Call to the patient to schedule AWV and asked about blood work; told Cynthia Garza that her K was low and that a rx had been called in. Stated she had VM but she had not listened to it yet.  She will pick it up. Also asked if her [name] could be written for 90 days instead of 30. Told her I am not sure insurance will pay 90 days since this is controlled. Current px good through May.  Please advise or fup. Thanks susanh

## 2015-06-21 NOTE — Telephone Encounter (Signed)
OK CBC, CMET, TSH, Lipids, UA Thx

## 2015-06-21 NOTE — Telephone Encounter (Signed)
Apt scheduled for 7/17 at 9am Please advise if she can have her labs drawn at this visit?

## 2015-06-21 NOTE — Telephone Encounter (Signed)
Error

## 2015-06-22 NOTE — Telephone Encounter (Signed)
No further f/u needed at this time. See 06/15/15 lab result.  Closing phone note.

## 2015-06-29 ENCOUNTER — Other Ambulatory Visit: Payer: Self-pay

## 2015-06-29 DIAGNOSIS — I10 Essential (primary) hypertension: Secondary | ICD-10-CM

## 2015-06-29 DIAGNOSIS — R5382 Chronic fatigue, unspecified: Secondary | ICD-10-CM

## 2015-06-29 DIAGNOSIS — E785 Hyperlipidemia, unspecified: Secondary | ICD-10-CM

## 2015-06-29 DIAGNOSIS — Z0000 Encounter for general adult medical examination without abnormal findings: Secondary | ICD-10-CM

## 2015-06-29 NOTE — Telephone Encounter (Signed)
Call to Cynthia Garza; Decided to come in early day of visit on 7/25 for labs; will see Dr. Camila Li at 8:15 and AWV post CPE

## 2015-07-10 ENCOUNTER — Other Ambulatory Visit: Payer: Self-pay

## 2015-07-10 DIAGNOSIS — C50411 Malignant neoplasm of upper-outer quadrant of right female breast: Secondary | ICD-10-CM

## 2015-07-11 ENCOUNTER — Other Ambulatory Visit (HOSPITAL_BASED_OUTPATIENT_CLINIC_OR_DEPARTMENT_OTHER): Payer: Medicare Other

## 2015-07-11 DIAGNOSIS — C50312 Malignant neoplasm of lower-inner quadrant of left female breast: Secondary | ICD-10-CM

## 2015-07-11 DIAGNOSIS — C50911 Malignant neoplasm of unspecified site of right female breast: Secondary | ICD-10-CM

## 2015-07-11 DIAGNOSIS — C50411 Malignant neoplasm of upper-outer quadrant of right female breast: Secondary | ICD-10-CM

## 2015-07-11 LAB — CBC WITH DIFFERENTIAL/PLATELET
BASO%: 0.5 % (ref 0.0–2.0)
Basophils Absolute: 0 10*3/uL (ref 0.0–0.1)
EOS ABS: 0.1 10*3/uL (ref 0.0–0.5)
EOS%: 1.9 % (ref 0.0–7.0)
HCT: 41.5 % (ref 34.8–46.6)
HGB: 14 g/dL (ref 11.6–15.9)
LYMPH%: 30.3 % (ref 14.0–49.7)
MCH: 28.6 pg (ref 25.1–34.0)
MCHC: 33.7 g/dL (ref 31.5–36.0)
MCV: 84.9 fL (ref 79.5–101.0)
MONO#: 1.1 10*3/uL — AB (ref 0.1–0.9)
MONO%: 14.1 % — ABNORMAL HIGH (ref 0.0–14.0)
NEUT#: 4 10*3/uL (ref 1.5–6.5)
NEUT%: 53.2 % (ref 38.4–76.8)
PLATELETS: 197 10*3/uL (ref 145–400)
RBC: 4.89 10*6/uL (ref 3.70–5.45)
RDW: 13.3 % (ref 11.2–14.5)
WBC: 7.5 10*3/uL (ref 3.9–10.3)
lymph#: 2.3 10*3/uL (ref 0.9–3.3)

## 2015-07-11 LAB — COMPREHENSIVE METABOLIC PANEL
ALT: <9 U/L (ref 0–55)
ANION GAP: 9 meq/L (ref 3–11)
AST: 12 U/L (ref 5–34)
Albumin: 3.9 g/dL (ref 3.5–5.0)
Alkaline Phosphatase: 71 U/L (ref 40–150)
BUN: 21.5 mg/dL (ref 7.0–26.0)
CHLORIDE: 105 meq/L (ref 98–109)
CO2: 28 meq/L (ref 22–29)
Calcium: 9.5 mg/dL (ref 8.4–10.4)
Creatinine: 0.9 mg/dL (ref 0.6–1.1)
EGFR: 63 mL/min/{1.73_m2} — AB (ref >90)
Glucose: 96 mg/dl (ref 70–140)
POTASSIUM: 4.1 meq/L (ref 3.5–5.1)
Sodium: 142 mEq/L (ref 136–145)
Total Bilirubin: 0.39 mg/dL (ref 0.20–1.20)
Total Protein: 6.8 g/dL (ref 6.4–8.3)

## 2015-07-17 ENCOUNTER — Telehealth: Payer: Self-pay | Admitting: Oncology

## 2015-07-17 ENCOUNTER — Ambulatory Visit (HOSPITAL_BASED_OUTPATIENT_CLINIC_OR_DEPARTMENT_OTHER): Payer: Medicare Other | Admitting: Oncology

## 2015-07-17 VITALS — BP 119/62 | HR 80 | Temp 98.0°F | Resp 18 | Ht 61.0 in | Wt 132.7 lb

## 2015-07-17 DIAGNOSIS — C50811 Malignant neoplasm of overlapping sites of right female breast: Secondary | ICD-10-CM

## 2015-07-17 DIAGNOSIS — C50312 Malignant neoplasm of lower-inner quadrant of left female breast: Secondary | ICD-10-CM

## 2015-07-17 DIAGNOSIS — C50411 Malignant neoplasm of upper-outer quadrant of right female breast: Secondary | ICD-10-CM

## 2015-07-17 DIAGNOSIS — Z170 Estrogen receptor positive status [ER+]: Secondary | ICD-10-CM

## 2015-07-17 MED ORDER — ANASTROZOLE 1 MG PO TABS: ORAL_TABLET | ORAL | Status: DC

## 2015-07-17 NOTE — Telephone Encounter (Signed)
appt made and avs printed. Mammogram set for 3/9 @ 845 am. Pt aware

## 2015-07-17 NOTE — Progress Notes (Signed)
[place]  [phone] [phone]     ID: Cynthia Garza OB: [birth year]  MR#: [id number]  QVZ#:[id number]  PCP: Cynthia Kehr, MD GYN:   SU: Autumn Messing OTHER MD: Cynthia Garza  CHIEF COMPLAINT: estrogen receptor positive breast cancer  CURRENT TREATMENT: anastrozole  BREAST CANCER HISTORY: From Dr.Khan's 06/08/2013 note:  "1. [The patient] underwent a six-month followup mammogram for left-sided abnormalities. On her mammogram she was noted to have a right breast mass at the 12:00 position. By ultrasound it measured 1.5 cm. Patient went on to have a biopsy performed of this mass. This revealed intermediate grade invasive ductal carcinoma ER positive PR positive HER-2/neu negative with a proliferation marker Ki-67 22%. She had MRI of the breasts performed. The MRI showed a 1.3 cm enhancement known for the by biopsy with 2 small satellite lesion. She was also noted to have a another linear enhancement as well in the right breast. On the left side she was noted to have a 7 mm mass by ultrasound this measured 8 mm this was at the 7:00 position. Patient had a biopsy of this performed. By pathology report report this is consistent with an invasive cancer.   2. status post lumpectomy performed on 07/07/2013 of the left breast. The final pathology revealed a 0.7 cm invasive ductal carcinoma with ductal carcinoma in situ. Sentinel lymph node was negative for metastatic disease. Tumor was ER positive PR positive HER-2/neu negative with a proliferation marker Ki-67 13%. Stage I (T1 N0)  #3 patient had Oncotype DX testing performed her breast cancer recurrence score was 0 giving her a 3% risk of distant recurrence with tamoxifen for 5 years. Patient will not need chemotherapy but will need antiestrogen therapy"  Her subsequent history is as detailed below.  INTERVAL HISTORY: Cynthia Garza returns today for follow up of her estrogen receptor positive breast cancer.  She continues on anastrozole. She tolerates that well. Hot flashes and vaginal dryness are not major concern. She never experienced the arthralgias or myalgias that patients can have on this drug. She obtains it at approximately $5 a month.  REVIEW OF SYSTEMS: Cynthia Garza has pain at the base of her right thumb and a little bit of swelling on her right wrist. She sometimes feels a little woozy when she stands up. She was recently started on potassium supplementation and since that was done she's having 3 or 4 bowel movements a day. They're not black or bloody and they are not diarrhea. There are not voluminous. She recently had her eyes examined and she has significant as treatment isn't issues. She feels mildly fatigued. She is a little bit forgetful. She gets anxious that she is going to have a stroke some day. Aside from these issues a detailed review of systems today was stable.  PAST MEDICAL HISTORY: Past Medical History  Diagnosis Date  . Hemangioma of intra-abdominal structures   . Renal cyst   . Shingles   . Paresthesia   . Heart murmur   . Dyslipidemia   . GERD (gastroesophageal reflux disease)   . Osteoporosis   . Insomnia   . Depression   . HTN (hypertension)   . LBP (low back pain)   . Anxiety   . Breast cancer ([place]) 2015    ER+/PR+/Her2-  . Radiation 08/08/13-08/29/13    Bilat.breast 42.72 Gy    PAST SURGICAL HISTORY: Past Surgical History  Procedure Laterality Date  . Abdominal hysterectomy      ?1970's  . Breast  lumpectomy with needle localization and axillary sentinel lymph node bx Bilateral 07/07/2013    Procedure: BREAST LUMPECTOMY WITH NEEDLE LOCALIZATION AND AXILLARY SENTINEL LYMPH NODE BIOPIES;  Surgeon: Cynthia Roof, MD;  Location: [facility];  Service: General;  Laterality: Bilateral;    FAMILY HISTORY Family History  Problem Relation Age of Onset  . Kidney failure Mother   . Hypertension Father   . Esophageal cancer Father   . Coronary artery disease Neg Hx      GYNECOLOGIC HISTORY:  Menarche age 46, first live birth age 31, she is [name] P2. She underwent menopause in the late 1970s. She did not take hormone replacement.  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 Her husband Cynthia Garza used to work in Actuary. The patient's daughter Cynthia Garza used to be a Education officer, museum but now works in Engineer, mining as. Daughter Cynthia Garza is a homemaker. The patient has 4 grandchildren. She attends a local [employer]   HEALTH MAINTENANCE: Social History  Substance Use Topics  . Smoking status: Never Smoker   . Smokeless tobacco: Never Used  . Alcohol Use: No     Colonoscopy:  PAP:  Bone density:  Lipid panel:  Allergies  Allergen Reactions  . Cholestoff [Plant Sterols And Stanols]   . Simvastatin Other (See Comments)    aches  . Diflucan [Fluconazole] Rash    Current Outpatient Prescriptions  Medication Sig Dispense Refill  . amLODipine (NORVASC) 5 MG tablet Take 1 tablet (5 mg total) by mouth daily. 90 tablet 3  . anastrozole (ARIMIDEX) 1 MG tablet TAKE 1 TABLET (1 MG TOTAL) BY MOUTH DAILY. 90 tablet 3  . aspirin 81 MG tablet Take 81 mg by mouth daily.    Marland Kitchen buPROPion (WELLBUTRIN XL) 150 MG 24 hr tablet Take 1 tablet (150 mg total) by mouth daily. 90 tablet 3  . Carboxymethylcellul-Glycerin (OPTIVE) 0.5-0.9 % SOLN Place 1-2 drops into both eyes at bedtime. For dry eyes    . losartan-hydrochlorothiazide (HYZAAR) 100-25 MG per tablet TAKE 1 TABLET BY MOUTH DAILY. 90 tablet 3  . Multiple Vitamins-Minerals (ALIVE ONCE DAILY WOMENS 50+) TABS Take 1 tablet by mouth daily.    . potassium chloride (KLOR-CON) 8 MEQ tablet Take 1 tablet (8 mEq total) by mouth daily. 30 tablet 11  . ranitidine (ZANTAC) 150 MG tablet Take 150 mg by mouth 2 (two) times daily.    . vitamin B-12 (CYANOCOBALAMIN) 1000 MCG tablet Take 1,000 mcg by mouth daily.    Marland Kitchen zolpidem  (AMBIEN) 10 MG tablet Take 1 tablet (10 mg total) by mouth at bedtime as needed. for sleep 90 tablet 1   Current Facility-Administered Medications  Medication Dose Route Frequency Provider Last Rate Last Dose  . methylPREDNISolone acetate (DEPO-MEDROL) injection 80 mg  80 mg Intra-articular Once Aleksei Plotnikov V, MD        OBJECTIVE: Older white woman who appears stated age 41 Vitals:   07/17/15 1415  BP: 119/62  Pulse: 80  Temp: 98 F (36.7 C)  Resp: 18     Body mass index is 25.09 kg/(m^2).    ECOG FS:1 - Symptomatic but completely ambulatory  Sclerae unicteric, EOMs intact Oropharynx clear and moist-- no thrush or other lesions No cervical or supraclavicular adenopathy Lungs no rales or rhonchi Heart regular rate and rhythm Abd soft, nontender, positive bowel sounds MSK no focal spinal tenderness, no  upper extremity lymphedema Neuro: nonfocal, well oriented, very social in her interactions Breasts: Status post bilateral lumpectomies. There is no evidence of local recurrence. Both axillae are benign.   LAB RESULTS:  CMP     Component Value Date/Time   NA 142 07/11/2015 1401   NA 140 06/15/2015 1354   K 4.1 07/11/2015 1401   K 3.4* 06/15/2015 1354   CL 102 06/15/2015 1354   CO2 28 07/11/2015 1401   CO2 29 06/15/2015 1354   GLUCOSE 96 07/11/2015 1401   GLUCOSE 100* 06/15/2015 1354   BUN 21.5 07/11/2015 1401   BUN 19 06/15/2015 1354   CREATININE 0.9 07/11/2015 1401   CREATININE 0.81 06/15/2015 1354   CALCIUM 9.5 07/11/2015 1401   CALCIUM 9.2 06/15/2015 1354   PROT 6.8 07/11/2015 1401   PROT 7.1 12/13/2014 1104   ALBUMIN 3.9 07/11/2015 1401   ALBUMIN 4.4 12/13/2014 1104   AST 12 07/11/2015 1401   AST 14 12/13/2014 1104   ALT <9 07/11/2015 1401   ALT 8 12/13/2014 1104   ALKPHOS 71 07/11/2015 1401   ALKPHOS 62 12/13/2014 1104   BILITOT 0.39 07/11/2015 1401   BILITOT 0.6 12/13/2014 1104   GFRNONAA 62* 06/30/2013 1421   GFRAA 72* 06/30/2013 1421     I No results found for: SPEP  Lab Results  Component Value Date   WBC 7.5 07/11/2015   NEUTROABS 4.0 07/11/2015   HGB 14.0 07/11/2015   HCT 41.5 07/11/2015   MCV 84.9 07/11/2015   PLT 197 07/11/2015      Chemistry      Component Value Date/Time   NA 142 07/11/2015 1401   NA 140 06/15/2015 1354   K 4.1 07/11/2015 1401   K 3.4* 06/15/2015 1354   CL 102 06/15/2015 1354   CO2 28 07/11/2015 1401   CO2 29 06/15/2015 1354   BUN 21.5 07/11/2015 1401   BUN 19 06/15/2015 1354   CREATININE 0.9 07/11/2015 1401   CREATININE 0.81 06/15/2015 1354      Component Value Date/Time   CALCIUM 9.5 07/11/2015 1401   CALCIUM 9.2 06/15/2015 1354   ALKPHOS 71 07/11/2015 1401   ALKPHOS 62 12/13/2014 1104   AST 12 07/11/2015 1401   AST 14 12/13/2014 1104   ALT <9 07/11/2015 1401   ALT 8 12/13/2014 1104   BILITOT 0.39 07/11/2015 1401   BILITOT 0.6 12/13/2014 1104       No results found for: LABCA2  No components found for: LABCA125  No results for input(s): INR in the last 168 hours.  Urinalysis    Component Value Date/Time   COLORURINE LT. YELLOW 02/09/2012 1621   APPEARANCEUR CLEAR 02/09/2012 1621   LABSPEC <=1.005 02/09/2012 1621   PHURINE 6.5 02/09/2012 1621   GLUCOSEU NEGATIVE 02/09/2012 1621   GLUCOSEU NEGATIVE 12/04/2011 1640   HGBUR NEGATIVE 02/09/2012 1621   BILIRUBINUR NEGATIVE 02/09/2012 1621   BILIRUBINUR neg 12/03/2011 1540   KETONESUR NEGATIVE 02/09/2012 1621   PROTEINUR 100* 12/04/2011 1640   PROTEINUR positive 12/03/2011 1540   UROBILINOGEN 0.2 02/09/2012 1621   UROBILINOGEN 0.2 12/03/2011 1540   NITRITE NEGATIVE 02/09/2012 1621   NITRITE neg 12/03/2011 1540   LEUKOCYTESUR TRACE 02/09/2012 1621    STUDIES: Mammography currently due.  ASSESSMENT: 82 y.o. [place] woman with bilateral breast cancers  (1) RIGHT BREAST: Status post right lumpectomy and right axillary lymph node sampling 07/07/2013 for a pT1c pN0, stage IA invasive ductal carcinoma,  grade 2, estrogen receptor 100% positive, progesterone receptor 97%  positive, with an MIB-1 of 22% and no HER-2 amplification  (a) Oncotype DX score of 0 predicts an outside the breast recurrence of 3% within 10 years if the patient's only systemic treatment is tamoxifen for 5 years  (b) adjuvant radiation completed 08/29/2013  (2) LEFT BREAST: Status post left lumpectomy 07/07/2013 for a pT1b pN0, stage IA invasive ductal carcinoma, grade 2, estrogen receptor 100% positive, progesterone receptor 54% positive, with an MIB-1 of 13% and no HER-2 amplification  (b) Completed adjuvant radiation 08/29/2013  (3) SYSTEMIC THERAPY:   (a) started on anastrozole 09/23/2013   (i) bone density at [facility] 07/12/2014 was normal with a T score of -0.9.  PLAN: Cynthia Garza is now 2 years out from her bilateral lumpectomies, with no evidence of disease recurrence. This is very favorable.  She is tolerating the anastrozole well, and the plan will be to continue that anti-estrogen through April 2020.  Cynthia Garza has many small anxieties but no major problem that I can tell. It could be that her slight change in bowel habits is due to her potassium supplementation, but I pointed out that her potassium is indeed now corrected. I encouraged her to continue on it.  She is behind in her mammography. She deliberately put it off because "I don't like to get bad news closed goats. I assured her she was going to get good news when she did the exam. That is what she takes anastrozole for.   I will see her again in one year. She knows to call for any problems that may develop before the next visit here. Chauncey Cruel, MD   07/17/2015 2:21 PM

## 2015-08-02 DIAGNOSIS — Z853 Personal history of malignant neoplasm of breast: Secondary | ICD-10-CM | POA: Diagnosis not present

## 2015-08-02 LAB — HM MAMMOGRAPHY

## 2015-08-05 ENCOUNTER — Other Ambulatory Visit: Payer: Self-pay | Admitting: Oncology

## 2015-08-07 ENCOUNTER — Encounter: Payer: Self-pay | Admitting: Internal Medicine

## 2015-08-15 DIAGNOSIS — M654 Radial styloid tenosynovitis [de Quervain]: Secondary | ICD-10-CM | POA: Diagnosis not present

## 2015-08-20 ENCOUNTER — Encounter: Payer: Self-pay | Admitting: Oncology

## 2015-09-04 DIAGNOSIS — C50312 Malignant neoplasm of lower-inner quadrant of left female breast: Secondary | ICD-10-CM | POA: Diagnosis not present

## 2015-09-04 DIAGNOSIS — C50411 Malignant neoplasm of upper-outer quadrant of right female breast: Secondary | ICD-10-CM | POA: Diagnosis not present

## 2015-10-05 ENCOUNTER — Other Ambulatory Visit: Payer: Self-pay | Admitting: Internal Medicine

## 2015-10-14 ENCOUNTER — Other Ambulatory Visit: Payer: Self-pay | Admitting: Internal Medicine

## 2015-10-17 NOTE — Telephone Encounter (Signed)
Received call pt states pharmacy has sent request 3 days ago for her Cynthia Garza & they have not receive response. Inform pt once MD ok refill will give her a call to let her know rx was sent to pharmacy. Pls advise on refill...[name]

## 2015-10-17 NOTE — Telephone Encounter (Signed)
Patient has an appt for tue, 10/23/2015. She is asking for a partial fill until that appt.

## 2015-10-23 ENCOUNTER — Encounter: Payer: Self-pay | Admitting: Internal Medicine

## 2015-10-23 ENCOUNTER — Ambulatory Visit (INDEPENDENT_AMBULATORY_CARE_PROVIDER_SITE_OTHER): Payer: Medicare Other | Admitting: Internal Medicine

## 2015-10-23 VITALS — BP 130/84 | HR 73 | Wt 132.0 lb

## 2015-10-23 DIAGNOSIS — G4700 Insomnia, unspecified: Secondary | ICD-10-CM

## 2015-10-23 DIAGNOSIS — Z23 Encounter for immunization: Secondary | ICD-10-CM

## 2015-10-23 DIAGNOSIS — I10 Essential (primary) hypertension: Secondary | ICD-10-CM | POA: Diagnosis not present

## 2015-10-23 DIAGNOSIS — C50411 Malignant neoplasm of upper-outer quadrant of right female breast: Secondary | ICD-10-CM | POA: Diagnosis not present

## 2015-10-23 MED ORDER — ZOLPIDEM TARTRATE 10 MG PO TABS: 10.0000 mg | ORAL_TABLET | Freq: Every evening | ORAL | Status: DC | PRN

## 2015-10-23 MED ORDER — LOSARTAN POTASSIUM-HCTZ 100-25 MG PO TABS: 1.0000 | ORAL_TABLET | Freq: Every day | ORAL | Status: DC

## 2015-10-23 NOTE — Assessment & Plan Note (Signed)
Dr Jana Hakim ER/PR + Her2-  Left [place] Right IDC On Arimidex  She had a breast ca surgery on 07/07/13. Finished XRT in 5/15

## 2015-10-23 NOTE — Assessment & Plan Note (Signed)
BP is hard to test due to h/o B mastectomy - wrist Hyzaar, Amlodipine

## 2015-10-23 NOTE — Progress Notes (Signed)
Subjective:  Patient ID: Cynthia Garza, female    DOB: January 29, 1933  Age: 82 y.o. MRN: [id number]  CC: No chief complaint on file.   HPI Cynthia Garza presents for R wrist pain, insomnia, depression, breast ca f/u  Outpatient Prescriptions Prior to Visit  Medication Sig Dispense Refill  . amLODipine (NORVASC) 5 MG tablet TAKE 1 TABLET (5 MG TOTAL) BY MOUTH DAILY. 90 tablet 3  . anastrozole (ARIMIDEX) 1 MG tablet TAKE 1 TABLET (1 MG TOTAL) BY MOUTH DAILY. 90 tablet 3  . aspirin 81 MG tablet Take 81 mg by mouth daily.    Marland Kitchen buPROPion (WELLBUTRIN XL) 150 MG 24 hr tablet Take 1 tablet (150 mg total) by mouth daily. 90 tablet 3  . Carboxymethylcellul-Glycerin (OPTIVE) 0.5-0.9 % SOLN Place 1-2 drops into both eyes at bedtime. For dry eyes    . losartan-hydrochlorothiazide (HYZAAR) 100-25 MG per tablet TAKE 1 TABLET BY MOUTH DAILY. 90 tablet 3  . Multiple Vitamins-Minerals (ALIVE ONCE DAILY WOMENS 50+) TABS Take 1 tablet by mouth daily.    . ranitidine (ZANTAC) 150 MG tablet Take 150 mg by mouth 2 (two) times daily.    . vitamin B-12 (CYANOCOBALAMIN) 1000 MCG tablet Take 1,000 mcg by mouth daily.    Marland Kitchen zolpidem (AMBIEN) 10 MG tablet TAKE 1 TABLET BY MOUTH AT BEDTIME AS NEEDED SLEEP 90 tablet 1  . anastrozole (ARIMIDEX) 1 MG tablet TAKE 1 TABLET (1 MG TOTAL) BY MOUTH DAILY. 90 tablet 3  . potassium chloride (KLOR-CON) 8 MEQ tablet Take 1 tablet (8 mEq total) by mouth daily. (Patient not taking: Reported on 10/23/2015) 30 tablet 11   Facility-Administered Medications Prior to Visit  Medication Dose Route Frequency Provider Last Rate Last Dose  . methylPREDNISolone acetate (DEPO-MEDROL) injection 80 mg  80 mg Intra-articular Once Cynthia Villalona V, MD        ROS Review of Systems  Constitutional: Negative for chills, activity change, appetite change, fatigue and unexpected weight change.  HENT: Negative for congestion, mouth sores and sinus pressure.   Eyes: Negative for visual  disturbance.  Respiratory: Negative for cough and chest tightness.   Gastrointestinal: Negative for nausea and abdominal pain.  Genitourinary: Negative for frequency, difficulty urinating and vaginal pain.  Musculoskeletal: Positive for arthralgias. Negative for back pain and gait problem.  Skin: Negative for pallor and rash.  Neurological: Negative for dizziness, tremors, weakness, numbness and headaches.  Psychiatric/Behavioral: Positive for sleep disturbance. Negative for suicidal ideas and confusion. The patient is nervous/anxious.     Objective:  BP 130/84 mmHg  Pulse 73  Wt 132 lb (59.875 kg)  SpO2 97%  BP Readings from Last 3 Encounters:  10/23/15 130/84  07/17/15 119/62  06/15/15 130/80    Wt Readings from Last 3 Encounters:  10/23/15 132 lb (59.875 kg)  07/17/15 132 lb 11.2 oz (60.192 kg)  06/15/15 131 lb (59.421 kg)    Physical Exam  Constitutional: She appears well-developed. No distress.  HENT:  Head: Normocephalic.  Right Ear: External ear normal.  Left Ear: External ear normal.  Nose: Nose normal.  Mouth/Throat: Oropharynx is clear and moist.  Eyes: Conjunctivae are normal. Pupils are equal, round, and reactive to light. Right eye exhibits no discharge. Left eye exhibits no discharge.  Neck: Normal range of motion. Neck supple. No JVD present. No tracheal deviation present. No thyromegaly present.  Cardiovascular: Normal rate, regular rhythm and normal heart sounds.   Pulmonary/Chest: No stridor. No respiratory distress. She has no wheezes.  Abdominal: Soft. Bowel sounds are normal. She exhibits no distension and no mass. There is no tenderness. There is no rebound and no guarding.  Musculoskeletal: She exhibits tenderness. She exhibits no edema.  Lymphadenopathy:    She has no cervical adenopathy.  Neurological: She displays normal reflexes. No cranial nerve deficit. She exhibits normal muscle tone. Coordination normal.  Skin: No rash noted. No erythema.    Psychiatric: She has a normal mood and affect. Her behavior is normal. Judgment and thought content normal.  R wrist w/a splint on  Lab Results  Component Value Date   WBC 7.5 07/11/2015   HGB 14.0 07/11/2015   HCT 41.5 07/11/2015   PLT 197 07/11/2015   GLUCOSE 96 07/11/2015   CHOL 251* 12/13/2014   TRIG 145.0 12/13/2014   HDL 61.70 12/13/2014   LDLDIRECT 144.7 02/09/2012   LDLCALC 160* 12/13/2014   ALT <9 07/11/2015   AST 12 07/11/2015   NA 142 07/11/2015   K 4.1 07/11/2015   CL 102 06/15/2015   CREATININE 0.9 07/11/2015   BUN 21.5 07/11/2015   CO2 28 07/11/2015   TSH 1.50 12/13/2014   INR 0.95 01/15/2012   HGBA1C 5.7 01/02/2011    No results found.  Assessment & Plan:   Diagnoses and all orders for this visit:  Essential hypertension  Breast cancer of upper-outer quadrant of right female breast ([place])  Other orders -     losartan-hydrochlorothiazide (HYZAAR) 100-25 MG tablet; Take 1 tablet by mouth daily. -     zolpidem (AMBIEN) 10 MG tablet; Take 1 tablet (10 mg total) by mouth at bedtime as needed for sleep.   I am having Cynthia Garza maintain her ranitidine, Carboxymethylcellul-Glycerin, aspirin, vitamin B-12, ALIVE ONCE DAILY WOMENS 50+, losartan-hydrochlorothiazide, buPROPion, potassium chloride, anastrozole, amLODipine, and zolpidem. We will continue to administer methylPREDNISolone acetate.  No orders of the defined types were placed in this encounter.     Follow-up: No Follow-up on file.  Cynthia Kehr, MD

## 2015-10-23 NOTE — Assessment & Plan Note (Signed)
Zolpidem  Potential benefits of a long term benzodiazepines  use as well as potential risks  and complications were explained to the patient and were aknowledged.  

## 2015-10-23 NOTE — Progress Notes (Signed)
Pre visit review using our clinic review tool, if applicable. No additional management support is needed unless otherwise documented below in the visit note. 

## 2015-11-02 ENCOUNTER — Other Ambulatory Visit: Payer: Self-pay | Admitting: Internal Medicine

## 2015-11-21 DIAGNOSIS — M25531 Pain in right wrist: Secondary | ICD-10-CM | POA: Diagnosis not present

## 2015-12-10 ENCOUNTER — Telehealth: Payer: Self-pay

## 2015-12-10 ENCOUNTER — Ambulatory Visit: Payer: Medicare Other

## 2015-12-10 NOTE — Telephone Encounter (Signed)
The patient called this am and did have AWV scheduled for today, but this was cancelled and she is scheduled to see DR. Plotnikov on 7/25 and will see me post his visit; at 8:45 ; posted on my schedule

## 2015-12-18 ENCOUNTER — Other Ambulatory Visit (INDEPENDENT_AMBULATORY_CARE_PROVIDER_SITE_OTHER): Payer: Medicare Other

## 2015-12-18 ENCOUNTER — Encounter: Payer: Self-pay | Admitting: Internal Medicine

## 2015-12-18 ENCOUNTER — Ambulatory Visit (INDEPENDENT_AMBULATORY_CARE_PROVIDER_SITE_OTHER): Payer: Medicare Other | Admitting: Internal Medicine

## 2015-12-18 VITALS — BP 140/78 | HR 78 | Ht 61.75 in | Wt 133.0 lb

## 2015-12-18 DIAGNOSIS — E785 Hyperlipidemia, unspecified: Secondary | ICD-10-CM

## 2015-12-18 DIAGNOSIS — R413 Other amnesia: Secondary | ICD-10-CM

## 2015-12-18 DIAGNOSIS — Z0000 Encounter for general adult medical examination without abnormal findings: Secondary | ICD-10-CM

## 2015-12-18 DIAGNOSIS — E876 Hypokalemia: Secondary | ICD-10-CM

## 2015-12-18 DIAGNOSIS — Z23 Encounter for immunization: Secondary | ICD-10-CM

## 2015-12-18 DIAGNOSIS — F329 Major depressive disorder, single episode, unspecified: Secondary | ICD-10-CM

## 2015-12-18 DIAGNOSIS — F32A Depression, unspecified: Secondary | ICD-10-CM

## 2015-12-18 LAB — BASIC METABOLIC PANEL
BUN: 14 mg/dL (ref 6–23)
CALCIUM: 9.4 mg/dL (ref 8.4–10.5)
CO2: 29 meq/L (ref 19–32)
Chloride: 103 mEq/L (ref 96–112)
Creatinine, Ser: 0.78 mg/dL (ref 0.40–1.20)
GFR: 75.36 mL/min (ref >60.00)
Glucose, Bld: 103 mg/dL — ABNORMAL HIGH (ref 70–99)
Potassium: 3.7 mEq/L (ref 3.5–5.1)
SODIUM: 141 meq/L (ref 135–145)

## 2015-12-18 LAB — CBC WITH DIFFERENTIAL/PLATELET
Basophils Absolute: 0 10*3/uL (ref 0.0–0.1)
Basophils Relative: 0.4 % (ref 0.0–3.0)
EOS PCT: 3.5 % (ref 0.0–5.0)
Eosinophils Absolute: 0.3 10*3/uL (ref 0.0–0.7)
HCT: 40.3 % (ref 36.0–46.0)
Hemoglobin: 13.6 g/dL (ref 12.0–15.0)
LYMPHS ABS: 1.8 10*3/uL (ref 0.7–4.0)
Lymphocytes Relative: 24.2 % (ref 12.0–46.0)
MCHC: 33.7 g/dL (ref 30.0–36.0)
MCV: 82.8 fl (ref 78.0–100.0)
MONO ABS: 0.8 10*3/uL (ref 0.1–1.0)
Monocytes Relative: 10.6 % (ref 3.0–12.0)
NEUTROS PCT: 61.3 % (ref 43.0–77.0)
Neutro Abs: 4.6 10*3/uL (ref 1.4–7.7)
PLATELETS: 217 10*3/uL (ref 150.0–400.0)
RBC: 4.87 Mil/uL (ref 3.87–5.11)
RDW: 13.5 % (ref 11.5–15.5)
WBC: 7.6 10*3/uL (ref 4.0–10.5)

## 2015-12-18 LAB — URINALYSIS
Bilirubin Urine: NEGATIVE
Hgb urine dipstick: NEGATIVE
KETONES UR: NEGATIVE
Leukocytes, UA: NEGATIVE
Nitrite: NEGATIVE
PH: 7 (ref 5.0–8.0)
SPECIFIC GRAVITY, URINE: 1.01 (ref 1.000–1.030)
TOTAL PROTEIN, URINE-UPE24: NEGATIVE
URINE GLUCOSE: NEGATIVE
UROBILINOGEN UA: 0.2 (ref 0.0–1.0)

## 2015-12-18 LAB — LIPID PANEL
CHOL/HDL RATIO: 4
Cholesterol: 224 mg/dL — ABNORMAL HIGH (ref 0–200)
HDL: 52.3 mg/dL (ref >39.00)
LDL CALC: 132 mg/dL — AB (ref 0–99)
NONHDL: 171.94
Triglycerides: 200 mg/dL — ABNORMAL HIGH (ref 0.0–149.0)
VLDL: 40 mg/dL (ref 0.0–40.0)

## 2015-12-18 LAB — HEPATIC FUNCTION PANEL
ALK PHOS: 60 U/L (ref 39–117)
ALT: 7 U/L (ref 0–35)
AST: 14 U/L (ref 0–37)
Albumin: 4.1 g/dL (ref 3.5–5.2)
BILIRUBIN DIRECT: 0.1 mg/dL (ref 0.0–0.3)
BILIRUBIN TOTAL: 0.6 mg/dL (ref 0.2–1.2)
Total Protein: 6.6 g/dL (ref 6.0–8.3)

## 2015-12-18 LAB — TSH: TSH: 1.97 u[IU]/mL (ref 0.35–4.50)

## 2015-12-18 MED ORDER — BUPROPION HCL ER (XL) 150 MG PO TB24: 150.0000 mg | ORAL_TABLET | Freq: Every day | ORAL | 3 refills | Status: DC

## 2015-12-18 NOTE — Patient Instructions (Addendum)
Cynthia Garza , Thank you for taking time to come for your Medicare Wellness Visit. I appreciate your ongoing commitment to your health goals. Please review the following plan we discussed and let me know if I can assist you in the future.     These are the goals we discussed: Goals    . Eat more fruits and vegetables          Incorporate more fruits and vegetables in her diet        This is a list of the screening recommended for you and due dates:  Health Maintenance  Topic Date Due  . Tetanus Vaccine  12/12/2008  . Flu Shot  12/25/2015  . DEXA scan (bone density measurement)  Completed  . Shingles Vaccine  Completed  . Pneumonia vaccines  Completed    Preventive Care for Adults, Female A healthy lifestyle and preventive care can promote health and wellness. Preventive health guidelines for women include the following key practices.  A routine yearly physical is a good way to check with your health care provider about your health and preventive screening. It is a chance to share any concerns and updates on your health and to receive a thorough exam.  Visit your dentist for a routine exam and preventive care every 6 months. Brush your teeth twice a day and floss once a day. Good oral hygiene prevents tooth decay and gum disease.  The frequency of eye exams is based on your age, health, family medical history, use of contact lenses, and other factors. Follow your health care provider's recommendations for frequency of eye exams.  Eat a healthy diet. Foods like vegetables, fruits, whole grains, low-fat dairy products, and lean protein foods contain the nutrients you need without too many calories. Decrease your intake of foods high in solid fats, added sugars, and salt. Eat the right amount of calories for you.Get information about a proper diet from your health care provider, if necessary.  Regular physical exercise is one of the most important things you can do for your  health. Most adults should get at least 150 minutes of moderate-intensity exercise (any activity that increases your heart rate and causes you to sweat) each week. In addition, most adults need muscle-strengthening exercises on 2 or more days a week.  Maintain a healthy weight. The body mass index (BMI) is a screening tool to identify possible weight problems. It provides an estimate of body fat based on height and weight. Your health care provider can find your BMI and can help you achieve or maintain a healthy weight.For adults 20 years and older:  A BMI below 18.5 is considered underweight.  A BMI of 18.5 to 24.9 is normal.  A BMI of 25 to 29.9 is considered overweight.  A BMI of 30 and above is considered obese.  Maintain normal blood lipids and cholesterol levels by exercising and minimizing your intake of saturated fat. Eat a balanced diet with plenty of fruit and vegetables. Blood tests for lipids and cholesterol should begin at age 90 and be repeated every 5 years. If your lipid or cholesterol levels are high, you are over 50, or you are at high risk for heart disease, you may need your cholesterol levels checked more frequently.Ongoing high lipid and cholesterol levels should be treated with medicines if diet and exercise are not working.  If you smoke, find out from your health care provider how to quit. If you do not use tobacco, do not start.  Lung cancer screening is recommended for adults aged 72-80 years who are at high risk for developing lung cancer because of a history of smoking. A yearly low-dose CT scan of the lungs is recommended for people who have at least a 30-pack-year history of smoking and are a current smoker or have quit within the past 15 years. A pack year of smoking is smoking an average of 1 pack of cigarettes a day for 1 year (for example: 1 pack a day for 30 years or 2 packs a day for 15 years). Yearly screening should continue until the smoker has stopped  smoking for at least 15 years. Yearly screening should be stopped for people who develop a health problem that would prevent them from having lung cancer treatment.  If you are pregnant, do not drink alcohol. If you are breastfeeding, be very cautious about drinking alcohol. If you are not pregnant and choose to drink alcohol, do not have more than 1 drink per day. One drink is considered to be 12 ounces (355 mL) of beer, 5 ounces (148 mL) of wine, or 1.5 ounces (44 mL) of liquor.  Avoid use of street drugs. Do not share needles with anyone. Ask for help if you need support or instructions about stopping the use of drugs.  High blood pressure causes heart disease and increases the risk of stroke. Your blood pressure should be checked at least every 1 to 2 years. Ongoing high blood pressure should be treated with medicines if weight loss and exercise do not work.  If you are 110-81 years old, ask your health care provider if you should take aspirin to prevent strokes.  Diabetes screening is done by taking a blood sample to check your blood glucose level after you have not eaten for a certain period of time (fasting). If you are not overweight and you do not have risk factors for diabetes, you should be screened once every 3 years starting at age 67. If you are overweight or obese and you are 80-80 years of age, you should be screened for diabetes every year as part of your cardiovascular risk assessment.  Breast cancer screening is essential preventive care for women. You should practice "breast self-awareness." This means understanding the normal appearance and feel of your breasts and may include breast self-examination. Any changes detected, no matter how small, should be reported to a health care provider. Women in their 42s and 30s should have a clinical breast exam (CBE) by a health care provider as part of a regular health exam every 1 to 3 years. After age 29, women should have a CBE every year.  Starting at age 50, women should consider having a mammogram (breast X-ray test) every year. Women who have a family history of breast cancer should talk to their health care provider about genetic screening. Women at a high risk of breast cancer should talk to their health care providers about having an MRI and a mammogram every year.  Breast cancer gene (BRCA)-related cancer risk assessment is recommended for women who have family members with BRCA-related cancers. BRCA-related cancers include breast, ovarian, tubal, and peritoneal cancers. Having family members with these cancers may be associated with an increased risk for harmful changes (mutations) in the breast cancer genes BRCA1 and BRCA2. Results of the assessment will determine the need for genetic counseling and BRCA1 and BRCA2 testing.  Your health care provider may recommend that you be screened regularly for cancer of the pelvic organs (ovaries,  uterus, and vagina). This screening involves a pelvic examination, including checking for microscopic changes to the surface of your cervix (Pap test). You may be encouraged to have this screening done every 3 years, beginning at age 60.  For women ages 61-65, health care providers may recommend pelvic exams and Pap testing every 3 years, or they may recommend the Pap and pelvic exam, combined with testing for human papilloma virus (HPV), every 5 years. Some types of HPV increase your risk of cervical cancer. Testing for HPV may also be done on women of any age with unclear Pap test results.  Other health care providers may not recommend any screening for nonpregnant women who are considered low risk for pelvic cancer and who do not have symptoms. Ask your health care provider if a screening pelvic exam is right for you.  If you have had past treatment for cervical cancer or a condition that could lead to cancer, you need Pap tests and screening for cancer for at least 20 years after your treatment.  If Pap tests have been discontinued, your risk factors (such as having a new sexual partner) need to be reassessed to determine if screening should resume. Some women have medical problems that increase the chance of getting cervical cancer. In these cases, your health care provider may recommend more frequent screening and Pap tests.  Colorectal cancer can be detected and often prevented. Most routine colorectal cancer screening begins at the age of 39 years and continues through age 52 years. However, your health care provider may recommend screening at an earlier age if you have risk factors for colon cancer. On a yearly basis, your health care provider may provide home test kits to check for hidden blood in the stool. Use of a small camera at the end of a tube, to directly examine the colon (sigmoidoscopy or colonoscopy), can detect the earliest forms of colorectal cancer. Talk to your health care provider about this at age 69, when routine screening begins. Direct exam of the colon should be repeated every 5-10 years through age 40 years, unless early forms of precancerous polyps or small growths are found.  People who are at an increased risk for hepatitis B should be screened for this virus. You are considered at high risk for hepatitis B if:  You were born in a country where hepatitis B occurs often. Talk with your health care provider about which countries are considered high risk.  Your parents were born in a high-risk country and you have not received a shot to protect against hepatitis B (hepatitis B vaccine).  You have HIV or AIDS.  You use needles to inject street drugs.  You live with, or have sex with, someone who has hepatitis B.  You get hemodialysis treatment.  You take certain medicines for conditions like cancer, organ transplantation, and autoimmune conditions.  Hepatitis C blood testing is recommended for all people born from [birth year] through 1963 and any individual with known  risks for hepatitis C.  Practice safe sex. Use condoms and avoid high-risk sexual practices to reduce the spread of sexually transmitted infections (STIs). STIs include gonorrhea, chlamydia, syphilis, trichomonas, herpes, HPV, and human immunodeficiency virus (HIV). Herpes, HIV, and HPV are viral illnesses that have no cure. They can result in disability, cancer, and death.  You should be screened for sexually transmitted illnesses (STIs) including gonorrhea and chlamydia if:  You are sexually active and are younger than 24 years.  You are older than 24  years and your health care provider tells you that you are at risk for this type of infection.  Your sexual activity has changed since you were last screened and you are at an increased risk for chlamydia or gonorrhea. Ask your health care provider if you are at risk.  If you are at risk of being infected with HIV, it is recommended that you take a prescription medicine daily to prevent HIV infection. This is called preexposure prophylaxis (PrEP). You are considered at risk if:  You are sexually active and do not regularly use condoms or know the HIV status of your partner(s).  You take drugs by injection.  You are sexually active with a partner who has HIV.  Talk with your health care provider about whether you are at high risk of being infected with HIV. If you choose to begin PrEP, you should first be tested for HIV. You should then be tested every 3 months for as long as you are taking PrEP.  Osteoporosis is a disease in which the bones lose minerals and strength with aging. This can result in serious bone fractures or breaks. The risk of osteoporosis can be identified using a bone density scan. Women ages 66 years and over and women at risk for fractures or osteoporosis should discuss screening with their health care providers. Ask your health care provider whether you should take a calcium supplement or vitamin D to reduce the rate of  osteoporosis.  Menopause can be associated with physical symptoms and risks. Hormone replacement therapy is available to decrease symptoms and risks. You should talk to your health care provider about whether hormone replacement therapy is right for you.  Use sunscreen. Apply sunscreen liberally and repeatedly throughout the day. You should seek shade when your shadow is shorter than you. Protect yourself by wearing long sleeves, pants, a wide-brimmed hat, and sunglasses year round, whenever you are outdoors.  Once a month, do a whole body skin exam, using a mirror to look at the skin on your back. Tell your health care provider of new moles, moles that have irregular borders, moles that are larger than a pencil eraser, or moles that have changed in shape or color.  Stay current with required vaccines (immunizations).  Influenza vaccine. All adults should be immunized every year.  Tetanus, diphtheria, and acellular pertussis (Td, Tdap) vaccine. Pregnant women should receive 1 dose of Tdap vaccine during each pregnancy. The dose should be obtained regardless of the length of time since the last dose. Immunization is preferred during the 27th-36th week of gestation. An adult who has not previously received Tdap or who does not know her vaccine status should receive 1 dose of Tdap. This initial dose should be followed by tetanus and diphtheria toxoids (Td) booster doses every 10 years. Adults with an unknown or incomplete history of completing a 3-dose immunization series with Td-containing vaccines should begin or complete a primary immunization series including a Tdap dose. Adults should receive a Td booster every 10 years.  Varicella vaccine. An adult without evidence of immunity to varicella should receive 2 doses or a second dose if she has previously received 1 dose. Pregnant females who do not have evidence of immunity should receive the first dose after pregnancy. This first dose should be  obtained before leaving the health care facility. The second dose should be obtained 4-8 weeks after the first dose.  Human papillomavirus (HPV) vaccine. Females aged 13-26 years who have not received the vaccine previously  should obtain the 3-dose series. The vaccine is not recommended for use in pregnant females. However, pregnancy testing is not needed before receiving a dose. If a female is found to be pregnant after receiving a dose, no treatment is needed. In that case, the remaining doses should be delayed until after the pregnancy. Immunization is recommended for any person with an immunocompromised condition through the age of 91 years if she did not get any or all doses earlier. During the 3-dose series, the second dose should be obtained 4-8 weeks after the first dose. The third dose should be obtained 24 weeks after the first dose and 16 weeks after the second dose.  Zoster vaccine. One dose is recommended for adults aged 31 years or older unless certain conditions are present.  Measles, mumps, and rubella (MMR) vaccine. Adults born before [birth year] generally are considered immune to measles and mumps. Adults born in [birth year] or later should have 1 or more doses of MMR vaccine unless there is a contraindication to the vaccine or there is laboratory evidence of immunity to each of the three diseases. A routine second dose of MMR vaccine should be obtained at least 28 days after the first dose for students attending postsecondary schools, health care workers, or international travelers. People who received inactivated measles vaccine or an unknown type of measles vaccine during 1963-1967 should receive 2 doses of MMR vaccine. People who received inactivated mumps vaccine or an unknown type of mumps vaccine before 1979 and are at high risk for mumps infection should consider immunization with 2 doses of MMR vaccine. For females of childbearing age, rubella immunity should be determined. If there is no evidence  of immunity, females who are not pregnant should be vaccinated. If there is no evidence of immunity, females who are pregnant should delay immunization until after pregnancy. Unvaccinated health care workers born before [birth year] who lack laboratory evidence of measles, mumps, or rubella immunity or laboratory confirmation of disease should consider measles and mumps immunization with 2 doses of MMR vaccine or rubella immunization with 1 dose of MMR vaccine.  Pneumococcal 13-valent conjugate (PCV13) vaccine. When indicated, a person who is uncertain of his immunization history and has no record of immunization should receive the PCV13 vaccine. All adults 28 years of age and older should receive this vaccine. An adult aged 34 years or older who has certain medical conditions and has not been previously immunized should receive 1 dose of PCV13 vaccine. This PCV13 should be followed with a dose of pneumococcal polysaccharide (PPSV23) vaccine. Adults who are at high risk for pneumococcal disease should obtain the PPSV23 vaccine at least 8 weeks after the dose of PCV13 vaccine. Adults older than 80 years of age who have normal immune system function should obtain the PPSV23 vaccine dose at least 1 year after the dose of PCV13 vaccine.  Pneumococcal polysaccharide (PPSV23) vaccine. When PCV13 is also indicated, PCV13 should be obtained first. All adults aged 2 years and older should be immunized. An adult younger than age 59 years who has certain medical conditions should be immunized. Any person who resides in a nursing home or long-term care facility should be immunized. An adult smoker should be immunized. People with an immunocompromised condition and certain other conditions should receive both PCV13 and PPSV23 vaccines. People with human immunodeficiency virus (HIV) infection should be immunized as soon as possible after diagnosis. Immunization during chemotherapy or radiation therapy should be avoided. Routine use  of PPSV23 vaccine is not recommended  for American Indians, [place] Natives, or people younger than 79 years unless there are medical conditions that require PPSV23 vaccine. When indicated, people who have unknown immunization and have no record of immunization should receive PPSV23 vaccine. One-time revaccination 5 years after the first dose of PPSV23 is recommended for people aged 19-64 years who have chronic kidney failure, nephrotic syndrome, asplenia, or immunocompromised conditions. People who received 1-2 doses of PPSV23 before age 41 years should receive another dose of PPSV23 vaccine at age 18 years or later if at least 5 years have passed since the previous dose. Doses of PPSV23 are not needed for people immunized with PPSV23 at or after age 53 years.  Meningococcal vaccine. Adults with asplenia or persistent complement component deficiencies should receive 2 doses of quadrivalent meningococcal conjugate (MenACWY-D) vaccine. The doses should be obtained at least 2 months apart. Microbiologists working with certain meningococcal bacteria, [employer] recruits, people at risk during an outbreak, and people who travel to or live in countries with a high rate of meningitis should be immunized. A first-year college student up through age 10 years who is living in a residence hall should receive a dose if she did not receive a dose on or after her 16th birthday. Adults who have certain high-risk conditions should receive one or more doses of vaccine.  Hepatitis A vaccine. Adults who wish to be protected from this disease, have certain high-risk conditions, work with hepatitis A-infected animals, work in hepatitis A research labs, or travel to or work in countries with a high rate of hepatitis A should be immunized. Adults who were previously unvaccinated and who anticipate close contact with an international adoptee during the first 60 days after arrival in the [place] from a country with a high rate of  hepatitis A should be immunized.  Hepatitis B vaccine. Adults who wish to be protected from this disease, have certain high-risk conditions, may be exposed to blood or other infectious body fluids, are household contacts or sex partners of hepatitis B positive people, are clients or workers in certain care facilities, or travel to or work in countries with a high rate of hepatitis B should be immunized.  Haemophilus influenzae type b (Hib) vaccine. A previously unvaccinated person with asplenia or sickle cell disease or having a scheduled splenectomy should receive 1 dose of Hib vaccine. Regardless of previous immunization, a recipient of a hematopoietic stem cell transplant should receive a 3-dose series 6-12 months after her successful transplant. Hib vaccine is not recommended for adults with HIV infection. Preventive Services / Frequency Ages 86 to 20 years  Blood pressure check.** / Every 3-5 years.  Lipid and cholesterol check.** / Every 5 years beginning at age 45.  Clinical breast exam.** / Every 3 years for women in their 63s and 12s.  BRCA-related cancer risk assessment.** / For women who have family members with a BRCA-related cancer (breast, ovarian, tubal, or peritoneal cancers).  Pap test.** / Every 2 years from ages 29 through 35. Every 3 years starting at age 54 through age 81 or 75 with a history of 3 consecutive normal Pap tests.  HPV screening.** / Every 3 years from ages 73 through ages 58 to 33 with a history of 3 consecutive normal Pap tests.  Hepatitis C blood test.** / For any individual with known risks for hepatitis C.  Skin self-exam. / Monthly.  Influenza vaccine. / Every year.  Tetanus, diphtheria, and acellular pertussis (Tdap, Td) vaccine.** / Consult your health care  provider. Pregnant women should receive 1 dose of Tdap vaccine during each pregnancy. 1 dose of Td every 10 years.  Varicella vaccine.** / Consult your health care provider. Pregnant females  who do not have evidence of immunity should receive the first dose after pregnancy.  HPV vaccine. / 3 doses over 6 months, if 88 and younger. The vaccine is not recommended for use in pregnant females. However, pregnancy testing is not needed before receiving a dose.  Measles, mumps, rubella (MMR) vaccine.** / You need at least 1 dose of MMR if you were born in 1957 or later. You may also need a 2nd dose. For females of childbearing age, rubella immunity should be determined. If there is no evidence of immunity, females who are not pregnant should be vaccinated. If there is no evidence of immunity, females who are pregnant should delay immunization until after pregnancy.  Pneumococcal 13-valent conjugate (PCV13) vaccine.** / Consult your health care provider.  Pneumococcal polysaccharide (PPSV23) vaccine.** / 1 to 2 doses if you smoke cigarettes or if you have certain conditions.  Meningococcal vaccine.** / 1 dose if you are age 60 to 86 years and a Market researcher living in a residence hall, or have one of several medical conditions, you need to get vaccinated against meningococcal disease. You may also need additional booster doses.  Hepatitis A vaccine.** / Consult your health care provider.  Hepatitis B vaccine.** / Consult your health care provider.  Haemophilus influenzae type b (Hib) vaccine.** / Consult your health care provider. Ages 62 to 36 years  Blood pressure check.** / Every year.  Lipid and cholesterol check.** / Every 5 years beginning at age 57 years.  Lung cancer screening. / Every year if you are aged 23-80 years and have a 30-pack-year history of smoking and currently smoke or have quit within the past 15 years. Yearly screening is stopped once you have quit smoking for at least 15 years or develop a health problem that would prevent you from having lung cancer treatment.  Clinical breast exam.** / Every year after age 47 years.  BRCA-related cancer risk  assessment.** / For women who have family members with a BRCA-related cancer (breast, ovarian, tubal, or peritoneal cancers).  Mammogram.** / Every year beginning at age 49 years and continuing for as long as you are in good health. Consult with your health care provider.  Pap test.** / Every 3 years starting at age 48 years through age 12 or 65 years with a history of 3 consecutive normal Pap tests.  HPV screening.** / Every 3 years from ages 53 years through ages 37 to 84 years with a history of 3 consecutive normal Pap tests.  Fecal occult blood test (FOBT) of stool. / Every year beginning at age 21 years and continuing until age 60 years. You may not need to do this test if you get a colonoscopy every 10 years.  Flexible sigmoidoscopy or colonoscopy.** / Every 5 years for a flexible sigmoidoscopy or every 10 years for a colonoscopy beginning at age 83 years and continuing until age 62 years.  Hepatitis C blood test.** / For all people born from [birth year] through 1963 and any individual with known risks for hepatitis C.  Skin self-exam. / Monthly.  Influenza vaccine. / Every year.  Tetanus, diphtheria, and acellular pertussis (Tdap/Td) vaccine.** / Consult your health care provider. Pregnant women should receive 1 dose of Tdap vaccine during each pregnancy. 1 dose of Td every 10 years.  Varicella vaccine.** /  Consult your health care provider. Pregnant females who do not have evidence of immunity should receive the first dose after pregnancy.  Zoster vaccine.** / 1 dose for adults aged 67 years or older.  Measles, mumps, rubella (MMR) vaccine.** / You need at least 1 dose of MMR if you were born in 1957 or later. You may also need a second dose. For females of childbearing age, rubella immunity should be determined. If there is no evidence of immunity, females who are not pregnant should be vaccinated. If there is no evidence of immunity, females who are pregnant should delay immunization until  after pregnancy.  Pneumococcal 13-valent conjugate (PCV13) vaccine.** / Consult your health care provider.  Pneumococcal polysaccharide (PPSV23) vaccine.** / 1 to 2 doses if you smoke cigarettes or if you have certain conditions.  Meningococcal vaccine.** / Consult your health care provider.  Hepatitis A vaccine.** / Consult your health care provider.  Hepatitis B vaccine.** / Consult your health care provider.  Haemophilus influenzae type b (Hib) vaccine.** / Consult your health care provider. Ages 84 years and over  Blood pressure check.** / Every year.  Lipid and cholesterol check.** / Every 5 years beginning at age 75 years.  Lung cancer screening. / Every year if you are aged 55-80 years and have a 30-pack-year history of smoking and currently smoke or have quit within the past 15 years. Yearly screening is stopped once you have quit smoking for at least 15 years or develop a health problem that would prevent you from having lung cancer treatment.  Clinical breast exam.** / Every year after age 13 years.  BRCA-related cancer risk assessment.** / For women who have family members with a BRCA-related cancer (breast, ovarian, tubal, or peritoneal cancers).  Mammogram.** / Every year beginning at age 32 years and continuing for as long as you are in good health. Consult with your health care provider.  Pap test.** / Every 3 years starting at age 2 years through age 25 or 79 years with 3 consecutive normal Pap tests. Testing can be stopped between 65 and 70 years with 3 consecutive normal Pap tests and no abnormal Pap or HPV tests in the past 10 years.  HPV screening.** / Every 3 years from ages 66 years through ages 49 or 105 years with a history of 3 consecutive normal Pap tests. Testing can be stopped between 65 and 70 years with 3 consecutive normal Pap tests and no abnormal Pap or HPV tests in the past 10 years.  Fecal occult blood test (FOBT) of stool. / Every year beginning at  age 90 years and continuing until age 20 years. You may not need to do this test if you get a colonoscopy every 10 years.  Flexible sigmoidoscopy or colonoscopy.** / Every 5 years for a flexible sigmoidoscopy or every 10 years for a colonoscopy beginning at age 48 years and continuing until age 55 years.  Hepatitis C blood test.** / For all people born from [birth year] through 1963 and any individual with known risks for hepatitis C.  Osteoporosis screening.** / A one-time screening for women ages 31 years and over and women at risk for fractures or osteoporosis.  Skin self-exam. / Monthly.  Influenza vaccine. / Every year.  Tetanus, diphtheria, and acellular pertussis (Tdap/Td) vaccine.** / 1 dose of Td every 10 years.  Varicella vaccine.** / Consult your health care provider.  Zoster vaccine.** / 1 dose for adults aged 38 years or older.  Pneumococcal 13-valent conjugate (PCV13) vaccine.** /  Consult your health care provider.  Pneumococcal polysaccharide (PPSV23) vaccine.** / 1 dose for all adults aged 37 years and older.  Meningococcal vaccine.** / Consult your health care provider.  Hepatitis A vaccine.** / Consult your health care provider.  Hepatitis B vaccine.** / Consult your health care provider.  Haemophilus influenzae type b (Hib) vaccine.** / Consult your health care provider. ** Family history and personal history of risk and conditions may change your health care provider's recommendations.   This information is not intended to replace advice given to you by your health care provider. Make sure you discuss any questions you have with your health care provider.   Document Released: 07/08/2001 Document Revised: 06/02/2014 Document Reviewed: 10/07/2010 Elsevier Interactive Patient Education 2016 [place] in the Home  Falls can cause injuries. They can happen to people of all ages. There are many things you can do to make your home safe and to help  prevent falls.  WHAT CAN I DO ON THE OUTSIDE OF MY HOME?  Regularly fix the edges of walkways and driveways and fix any cracks.  Remove anything that might make you trip as you walk through a door, such as a raised step or threshold.  Trim any bushes or trees on the path to your home.  Use bright outdoor lighting.  Clear any walking paths of anything that might make someone trip, such as rocks or tools.  Regularly check to see if handrails are loose or broken. Make sure that both sides of any steps have handrails.  Any raised decks and porches should have guardrails on the edges.  Have any leaves, snow, or ice cleared regularly.  Use sand or salt on walking paths during winter.  Clean up any spills in your garage right away. This includes oil or grease spills. WHAT CAN I DO IN THE BATHROOM?   Use night lights.  Install grab bars by the toilet and in the tub and shower. Do not use towel bars as grab bars.  Use non-skid mats or decals in the tub or shower.  If you need to sit down in the shower, use a plastic, non-slip stool.  Keep the floor dry. Clean up any water that spills on the floor as soon as it happens.  Remove soap buildup in the tub or shower regularly.  Attach bath mats securely with double-sided non-slip rug tape.  Do not have throw rugs and other things on the floor that can make you trip. WHAT CAN I DO IN THE BEDROOM?  Use night lights.  Make sure that you have a light by your bed that is easy to reach.  Do not use any sheets or blankets that are too big for your bed. They should not hang down onto the floor.  Have a firm chair that has side arms. You can use this for support while you get dressed.  Do not have throw rugs and other things on the floor that can make you trip. WHAT CAN I DO IN THE KITCHEN?  Clean up any spills right away.  Avoid walking on wet floors.  Keep items that you use a lot in easy-to-reach places.  If you need to reach  something above you, use a strong step stool that has a grab bar.  Keep electrical cords out of the way.  Do not use floor polish or wax that makes floors slippery. If you must use wax, use non-skid floor wax.  Do not  have throw rugs and other things on the floor that can make you trip. WHAT CAN I DO WITH MY STAIRS?  Do not leave any items on the stairs.  Make sure that there are handrails on both sides of the stairs and use them. Fix handrails that are broken or loose. Make sure that handrails are as long as the stairways.  Check any carpeting to make sure that it is firmly attached to the stairs. Fix any carpet that is loose or worn.  Avoid having throw rugs at the top or bottom of the stairs. If you do have throw rugs, attach them to the floor with carpet tape.  Make sure that you have a light switch at the top of the stairs and the bottom of the stairs. If you do not have them, ask someone to add them for you. WHAT ELSE CAN I DO TO HELP PREVENT FALLS?  Wear shoes that:  Do not have high heels.  Have rubber bottoms.  Are comfortable and fit you well.  Are closed at the toe. Do not wear sandals.  If you use a stepladder:  Make sure that it is fully opened. Do not climb a closed stepladder.  Make sure that both sides of the stepladder are locked into place.  Ask someone to hold it for you, if possible.  Clearly mark and make sure that you can see:  Any grab bars or handrails.  First and last steps.  Where the edge of each step is.  Use tools that help you move around (mobility aids) if they are needed. These include:  Canes.  Walkers.  Scooters.  Crutches.  Turn on the lights when you go into a dark area. Replace any light bulbs as soon as they burn out.  Set up your furniture so you have a clear path. Avoid moving your furniture around.  If any of your floors are uneven, fix them.  If there are any pets around you, be aware of where they are.  Review  your medicines with your doctor. Some medicines can make you feel dizzy. This can increase your chance of falling. Ask your doctor what other things that you can do to help prevent falls.   This information is not intended to replace advice given to you by your health care provider. Make sure you discuss any questions you have with your health care provider.   Document Released: 03/08/2009 Document Revised: 09/26/2014 Document Reviewed: 06/16/2014 Elsevier Interactive Patient Education 2016 [place] Maintenance, Female Adopting a healthy lifestyle and getting preventive care can go a long way to promote health and wellness. Talk with your health care provider about what schedule of regular examinations is right for you. This is a good chance for you to check in with your provider about disease prevention and staying healthy. In between checkups, there are plenty of things you can do on your own. Experts have done a lot of research about which lifestyle changes and preventive measures are most likely to keep you healthy. Ask your health care provider for more information. WEIGHT AND DIET  Eat a healthy diet  Be sure to include plenty of vegetables, fruits, low-fat dairy products, and lean protein.  Do not eat a lot of foods high in solid fats, added sugars, or salt.  Get regular exercise. This is one of the most important things you can do for your health.  Most adults should exercise for at least 150 minutes each week. The  exercise should increase your heart rate and make you sweat (moderate-intensity exercise).  Most adults should also do strengthening exercises at least twice a week. This is in addition to the moderate-intensity exercise.  Maintain a healthy weight  Body mass index (BMI) is a measurement that can be used to identify possible weight problems. It estimates body fat based on height and weight. Your health care provider can help determine your BMI and help you  achieve or maintain a healthy weight.  For females 10 years of age and older:   A BMI below 18.5 is considered underweight.  A BMI of 18.5 to 24.9 is normal.  A BMI of 25 to 29.9 is considered overweight.  A BMI of 30 and above is considered obese.  Watch levels of cholesterol and blood lipids  You should start having your blood tested for lipids and cholesterol at 80 years of age, then have this test every 5 years.  You may need to have your cholesterol levels checked more often if:  Your lipid or cholesterol levels are high.  You are older than 80 years of age.  You are at high risk for heart disease.  CANCER SCREENING   Lung Cancer  Lung cancer screening is recommended for adults 14-82 years old who are at high risk for lung cancer because of a history of smoking.  A yearly low-dose CT scan of the lungs is recommended for people who:  Currently smoke.  Have quit within the past 15 years.  Have at least a 30-pack-year history of smoking. A pack year is smoking an average of one pack of cigarettes a day for 1 year.  Yearly screening should continue until it has been 15 years since you quit.  Yearly screening should stop if you develop a health problem that would prevent you from having lung cancer treatment.  Breast Cancer  Practice breast self-awareness. This means understanding how your breasts normally appear and feel.  It also means doing regular breast self-exams. Let your health care provider know about any changes, no matter how small.  If you are in your 20s or 30s, you should have a clinical breast exam (CBE) by a health care provider every 1-3 years as part of a regular health exam.  If you are 36 or older, have a CBE every year. Also consider having a breast X-ray (mammogram) every year.  If you have a family history of breast cancer, talk to your health care provider about genetic screening.  If you are at high risk for breast cancer, talk to your  health care provider about having an MRI and a mammogram every year.  Breast cancer gene (BRCA) assessment is recommended for women who have family members with BRCA-related cancers. BRCA-related cancers include:  Breast.  Ovarian.  Tubal.  Peritoneal cancers.  Results of the assessment will determine the need for genetic counseling and BRCA1 and BRCA2 testing. Cervical Cancer Your health care provider may recommend that you be screened regularly for cancer of the pelvic organs (ovaries, uterus, and vagina). This screening involves a pelvic examination, including checking for microscopic changes to the surface of your cervix (Pap test). You may be encouraged to have this screening done every 3 years, beginning at age 76.  For women ages 56-65, health care providers may recommend pelvic exams and Pap testing every 3 years, or they may recommend the Pap and pelvic exam, combined with testing for human papilloma virus (HPV), every 5 years. Some types of HPV  increase your risk of cervical cancer. Testing for HPV may also be done on women of any age with unclear Pap test results.  Other health care providers may not recommend any screening for nonpregnant women who are considered low risk for pelvic cancer and who do not have symptoms. Ask your health care provider if a screening pelvic exam is right for you.  If you have had past treatment for cervical cancer or a condition that could lead to cancer, you need Pap tests and screening for cancer for at least 20 years after your treatment. If Pap tests have been discontinued, your risk factors (such as having a new sexual partner) need to be reassessed to determine if screening should resume. Some women have medical problems that increase the chance of getting cervical cancer. In these cases, your health care provider may recommend more frequent screening and Pap tests. Colorectal Cancer  This type of cancer can be detected and often  prevented.  Routine colorectal cancer screening usually begins at 80 years of age and continues through 80 years of age.  Your health care provider may recommend screening at an earlier age if you have risk factors for colon cancer.  Your health care provider may also recommend using home test kits to check for hidden blood in the stool.  A small camera at the end of a tube can be used to examine your colon directly (sigmoidoscopy or colonoscopy). This is done to check for the earliest forms of colorectal cancer.  Routine screening usually begins at age 52.  Direct examination of the colon should be repeated every 5-10 years through 80 years of age. However, you may need to be screened more often if early forms of precancerous polyps or small growths are found. Skin Cancer  Check your skin from head to toe regularly.  Tell your health care provider about any new moles or changes in moles, especially if there is a change in a mole's shape or color.  Also tell your health care provider if you have a mole that is larger than the size of a pencil eraser.  Always use sunscreen. Apply sunscreen liberally and repeatedly throughout the day.  Protect yourself by wearing long sleeves, pants, a wide-brimmed hat, and sunglasses whenever you are outside. HEART DISEASE, DIABETES, AND HIGH BLOOD PRESSURE   High blood pressure causes heart disease and increases the risk of stroke. High blood pressure is more likely to develop in:  People who have blood pressure in the high end of the normal range (130-139/85-89 mm Hg).  People who are overweight or obese.  People who are African American.  If you are 55-52 years of age, have your blood pressure checked every 3-5 years. If you are 73 years of age or older, have your blood pressure checked every year. You should have your blood pressure measured twice--once when you are at a hospital or clinic, and once when you are not at a hospital or clinic.  Record the average of the two measurements. To check your blood pressure when you are not at a hospital or clinic, you can use:  An automated blood pressure machine at a pharmacy.  A home blood pressure monitor.  If you are between 10 years and 69 years old, ask your health care provider if you should take aspirin to prevent strokes.  Have regular diabetes screenings. This involves taking a blood sample to check your fasting blood sugar level.  If you are at a normal weight  and have a low risk for diabetes, have this test once every three years after 80 years of age.  If you are overweight and have a high risk for diabetes, consider being tested at a younger age or more often. PREVENTING INFECTION  Hepatitis B  If you have a higher risk for hepatitis B, you should be screened for this virus. You are considered at high risk for hepatitis B if:  You were born in a country where hepatitis B is common. Ask your health care provider which countries are considered high risk.  Your parents were born in a high-risk country, and you have not been immunized against hepatitis B (hepatitis B vaccine).  You have HIV or AIDS.  You use needles to inject street drugs.  You live with someone who has hepatitis B.  You have had sex with someone who has hepatitis B.  You get hemodialysis treatment.  You take certain medicines for conditions, including cancer, organ transplantation, and autoimmune conditions. Hepatitis C  Blood testing is recommended for:  Everyone born from [birth year] through 1963.  Anyone with known risk factors for hepatitis C. Sexually transmitted infections (STIs)  You should be screened for sexually transmitted infections (STIs) including gonorrhea and chlamydia if:  You are sexually active and are younger than 80 years of age.  You are older than 80 years of age and your health care provider tells you that you are at risk for this type of infection.  Your sexual activity  has changed since you were last screened and you are at an increased risk for chlamydia or gonorrhea. Ask your health care provider if you are at risk.  If you do not have HIV, but are at risk, it may be recommended that you take a prescription medicine daily to prevent HIV infection. This is called pre-exposure prophylaxis (PrEP). You are considered at risk if:  You are sexually active and do not regularly use condoms or know the HIV status of your partner(s).  You take drugs by injection.  You are sexually active with a partner who has HIV. Talk with your health care provider about whether you are at high risk of being infected with HIV. If you choose to begin PrEP, you should first be tested for HIV. You should then be tested every 3 months for as long as you are taking PrEP.  PREGNANCY   If you are premenopausal and you may become pregnant, ask your health care provider about preconception counseling.  If you may become pregnant, take 400 to 800 micrograms (mcg) of folic acid every day.  If you want to prevent pregnancy, talk to your health care provider about birth control (contraception). OSTEOPOROSIS AND MENOPAUSE   Osteoporosis is a disease in which the bones lose minerals and strength with aging. This can result in serious bone fractures. Your risk for osteoporosis can be identified using a bone density scan.  If you are 78 years of age or older, or if you are at risk for osteoporosis and fractures, ask your health care provider if you should be screened.  Ask your health care provider whether you should take a calcium or vitamin D supplement to lower your risk for osteoporosis.  Menopause may have certain physical symptoms and risks.  Hormone replacement therapy may reduce some of these symptoms and risks. Talk to your health care provider about whether hormone replacement therapy is right for you.  HOME CARE INSTRUCTIONS   Schedule regular health, dental, and eye  exams.  Stay current with your immunizations.   Do not use any tobacco products including cigarettes, chewing tobacco, or electronic cigarettes.  If you are pregnant, do not drink alcohol.  If you are breastfeeding, limit how much and how often you drink alcohol.  Limit alcohol intake to no more than 1 drink per day for nonpregnant women. One drink equals 12 ounces of beer, 5 ounces of wine, or 1 ounces of hard liquor.  Do not use street drugs.  Do not share needles.  Ask your health care provider for help if you need support or information about quitting drugs.  Tell your health care provider if you often feel depressed.  Tell your health care provider if you have ever been abused or do not feel safe at home.   This information is not intended to replace advice given to you by your health care provider. Make sure you discuss any questions you have with your health care provider.   Document Released: 11/25/2010 Document Revised: 06/02/2014 Document Reviewed: 04/13/2013 Elsevier Interactive Patient Education [employer].

## 2015-12-18 NOTE — Progress Notes (Signed)
Subjective:  Patient ID: Cynthia Garza, female    DOB: 11-Oct-1932  Age: 82 y.o. MRN: [id number]  CC: No chief complaint on file.   HPI BAILEI VANDERMOLEN presents for well exam F/u HTN, depression, OA f/u C/o head congestion  Outpatient Medications Prior to Visit  Medication Sig Dispense Refill  . amLODipine (NORVASC) 5 MG tablet TAKE 1 TABLET (5 MG TOTAL) BY MOUTH DAILY. 90 tablet 3  . anastrozole (ARIMIDEX) 1 MG tablet TAKE 1 TABLET (1 MG TOTAL) BY MOUTH DAILY. 90 tablet 3  . aspirin 81 MG tablet Take 81 mg by mouth daily.    Marland Kitchen buPROPion (WELLBUTRIN XL) 150 MG 24 hr tablet Take 1 tablet (150 mg total) by mouth daily. 90 tablet 3  . Carboxymethylcellul-Glycerin (OPTIVE) 0.5-0.9 % SOLN Place 1-2 drops into both eyes at bedtime. For dry eyes    . losartan-hydrochlorothiazide (HYZAAR) 100-25 MG tablet Take 1 tablet by mouth daily. 90 tablet 3  . losartan-hydrochlorothiazide (HYZAAR) 100-25 MG tablet TAKE 1 TABLET BY MOUTH DAILY. 90 tablet 3  . Multiple Vitamins-Minerals (ALIVE ONCE DAILY WOMENS 50+) TABS Take 1 tablet by mouth daily.    . ranitidine (ZANTAC) 150 MG tablet Take 150 mg by mouth 2 (two) times daily.    . vitamin B-12 (CYANOCOBALAMIN) 1000 MCG tablet Take 1,000 mcg by mouth daily.    Marland Kitchen zolpidem (AMBIEN) 10 MG tablet Take 1 tablet (10 mg total) by mouth at bedtime as needed for sleep. 90 tablet 1  . potassium chloride (KLOR-CON) 8 MEQ tablet Take 1 tablet (8 mEq total) by mouth daily. (Patient not taking: Reported on 12/18/2015) 30 tablet 11   Facility-Administered Medications Prior to Visit  Medication Dose Route Frequency Provider Last Rate Last Dose  . methylPREDNISolone acetate (DEPO-MEDROL) injection 80 mg  80 mg Intra-articular Once Cassandria Anger, MD        ROS Review of Systems  Constitutional: Negative for activity change, appetite change, chills, fatigue and unexpected weight change.  HENT: Negative for congestion, mouth sores and sinus pressure.     Eyes: Negative for visual disturbance.  Respiratory: Negative for cough and chest tightness.   Gastrointestinal: Negative for abdominal pain and nausea.  Genitourinary: Negative for difficulty urinating, frequency and vaginal pain.  Musculoskeletal: Positive for arthralgias and back pain. Negative for gait problem.  Skin: Negative for pallor and rash.  Neurological: Negative for dizziness, tremors, weakness, numbness and headaches.  Psychiatric/Behavioral: Negative for confusion, sleep disturbance and suicidal ideas. The patient is nervous/anxious.     Objective:  BP 140/78   Pulse 78   Ht 5' 1.75" (1.568 m)   Wt 133 lb (60.3 kg)   SpO2 95%   BMI 24.52 kg/m   BP Readings from Last 3 Encounters:  12/18/15 140/78  10/23/15 130/84  07/17/15 119/62    Wt Readings from Last 3 Encounters:  12/18/15 133 lb (60.3 kg)  10/23/15 132 lb (59.9 kg)  07/17/15 132 lb 11.2 oz (60.2 kg)    Physical Exam  Constitutional: She appears well-developed. No distress.  HENT:  Head: Normocephalic.  Right Ear: External ear normal.  Left Ear: External ear normal.  Nose: Nose normal.  Mouth/Throat: Oropharynx is clear and moist.  Eyes: Conjunctivae are normal. Pupils are equal, round, and reactive to light. Right eye exhibits no discharge. Left eye exhibits no discharge.  Neck: Normal range of motion. Neck supple. No JVD present. No tracheal deviation present. No thyromegaly present.  Cardiovascular: Normal rate, regular rhythm  and normal heart sounds.   Pulmonary/Chest: No stridor. No respiratory distress. She has no wheezes.  Abdominal: Soft. Bowel sounds are normal. She exhibits no distension and no mass. There is no tenderness. There is no rebound and no guarding.  Musculoskeletal: She exhibits no edema or tenderness.  Lymphadenopathy:    She has no cervical adenopathy.  Neurological: She displays normal reflexes. No cranial nerve deficit. She exhibits normal muscle tone. Coordination normal.   Skin: No rash noted. No erythema.  Psychiatric: She has a normal mood and affect. Her behavior is normal. Judgment and thought content normal.    Lab Results  Component Value Date   WBC 7.5 07/11/2015   HGB 14.0 07/11/2015   HCT 41.5 07/11/2015   PLT 197 07/11/2015   GLUCOSE 96 07/11/2015   CHOL 251 (H) 12/13/2014   TRIG 145.0 12/13/2014   HDL 61.70 12/13/2014   LDLDIRECT 144.7 02/09/2012   LDLCALC 160 (H) 12/13/2014   ALT <9 07/11/2015   AST 12 07/11/2015   NA 142 07/11/2015   K 4.1 07/11/2015   CL 102 06/15/2015   CREATININE 0.9 07/11/2015   BUN 21.5 07/11/2015   CO2 28 07/11/2015   TSH 1.50 12/13/2014   INR 0.95 01/15/2012   HGBA1C 5.7 01/02/2011    No results found.  Assessment & Plan:   There are no diagnoses linked to this encounter. I am having Ms. Prentiss maintain her ranitidine, Carboxymethylcellul-Glycerin, aspirin, vitamin B-12, ALIVE ONCE DAILY WOMENS 50+, buPROPion, potassium chloride, anastrozole, amLODipine, losartan-hydrochlorothiazide, zolpidem, and losartan-hydrochlorothiazide. We will continue to administer methylPREDNISolone acetate.  No orders of the defined types were placed in this encounter.    Follow-up: No Follow-up on file.  Walker Kehr, MD

## 2015-12-18 NOTE — Assessment & Plan Note (Signed)
Here for medicare wellness/physical  Diet: heart healthy  Physical activity: not sedentary  Depression/mood screen: negative overall  Hearing: intact to whispered voice  Visual acuity: grossly normal, performs annual eye exam  ADLs: capable  Fall risk: low  Home safety: good  Cognitive evaluation: intact to orientation, naming, recall and repetition  EOL planning: adv directives, full code/ I agree  I have personally reviewed and have noted  1. The patient's medical, surgical and social history  2. Their use of alcohol, tobacco or illicit drugs  3. Their current medications and supplements  4. The patient's functional ability including ADL's, fall risks, home safety risks and hearing or visual impairment.  5. Diet and physical activities  6. Evidence for depression or mood disorders 7. The roster of all physicians providing medical care to patient - is listed in the Snapshot section of the chart and reviewed today.    Today patient counseled on age appropriate routine health concerns for screening and prevention, each reviewed and up to date or declined. Immunizations reviewed and up to date or declined. Labs ordered and reviewed. Risk factors for depression reviewed and negative. Hearing function and visual acuity are intact. ADLs screened and addressed as needed. Functional ability and level of safety reviewed and appropriate. Education, counseling and referrals performed based on assessed risks today. Patient provided with a copy of personalized plan for preventive services.

## 2015-12-18 NOTE — Assessment & Plan Note (Signed)
Labs

## 2015-12-18 NOTE — Progress Notes (Signed)
Subjective:   Cynthia Garza is a 82 y.o. female who presents for Medicare Annual (Subsequent) preventive examination.  Review of Systems:   HRA assessment completed during this visit with Cynthia Garza  The Patient was informed that the wellness visit is to identify future health risk and educate and initiate measures that can reduce risk for increased disease through the lifespan.    NO ROS; Medicare Wellness Visit Last OV:  10/23/2015 Labs completed: chol 251; trig 145; HDL 61 and LDL 160  Osteopenia; weight bearing   Lifestyle review and risk:  HTn medically managed Dyslipidemia / good control but does like sweets on occasion with coffee Memory/ just  back from the beach;  No issues noted with memory; recall or life adjustment  Breast cancer surgery 2015;   Psychosocial: Married and lives with spouse 2 dtr and 4 grandchildren  Likes time alone as well /  Likes to go to [employer];   Tobacco: no or smoking cessation discussed: never smoked  How many drinks do you have per week? 0  Medications; reviewed by MD  BMI: 25  Diet;  no particular diet;  Breakfast; sometimes cheerios; oatmeal; honey bun Lunch; tomato sandwich Supper meat; vegetables; minimal fruit   Teeth or Denture issues? Sees a dentist regularly  Exercise; Garage in basement up stairs to get to the main level;  Up and down stairs frequently throughout the day  House work; very rarely sit down;  Did walk at [employer] and walked;  Does not want to go back;  Discussed [facility];   What is the hardest physical activity you have done recently and for how long?   HOME SAFETY; basement and main living;  Thinking about moving Fall hx; no   Gait: good Given education on "Fall Prevention in the Home" for more safety tips the patient can apply as appropriate.  Long term goal is to "age in place" or undecided  May move in rental property   Mental Health:  Any emotional problems?  Anxious, depressed, irritable, sad or blue? Don't think so;  Quite but denies depression; states sometimes she likes to be alone but no evidence of disengaging  Denies feeling depressed or hopeless; voices pleasure in daily life Educated regarding s/s of depression  Pain: no   Cognitive; no memory issues Manages checkbook, medications; no failures of task Ad8 score reviewed for issues;  Issues making decisions; no  Less interest in hobbies / activities" no  Repeats questions, stories; family complaining: no;   Trouble using ordinary gadgets; microwave; computer: no  Forgets the month or year: no  Mismanaging finances:  Still manages   Missing apt: no but does write them down  Daily problems with thinking of memory NO Ad8 score is 0   Any dizziness when standing up? No Vertigo occasionally   Mobilization and Functional losses from last year to this year? No   Sleep pattern changes; sleeps well  Cynthia Garza for sleep  Urinary or fecal incontinence reviewed: no   Advanced Directive addressed; yes; updated  Counseling Health Maintenance Gaps:  Colonoscopy; aged out  EKG: 06/2013 Mammogram:  06/2014 Dexa/minor osteopenia  Weight bearing exercise discussed  Muti vit and b12  PAP: educated regarding the need for GYN exam;   Hearing: adequate   Right ear; 2000zh Left ear: _0    Ophthalmology exam; just rec'd a reminder so will be going soon.  Dr. Newman Nip   Immunizations Due: (Vaccines reviewed and educated  regarding any overdue)  Tdap given today  Individual Goal: eat better;   Health Recommendations and Referrals Discussed exercise and may consider private pool if one available  Also discussed silver sneakers        Objective:     Vitals: BP 140/78   Pulse 78   Ht 5' 1.75" (1.568 m)   Wt 133 lb (60.3 kg)   SpO2 95%   BMI 24.52 kg/m   Body mass index is 24.52 kg/m.   Tobacco History  Smoking Status  . Never Smoker  Smokeless  Tobacco  . Never Used     Counseling given: Not Answered   Past Medical History:  Diagnosis Date  . Anxiety   . Breast cancer ([place]) 2015   ER+/PR+/Her2-  . Depression   . Dyslipidemia   . GERD (gastroesophageal reflux disease)   . Heart murmur   . Hemangioma of intra-abdominal structures   . HTN (hypertension)   . Insomnia   . LBP (low back pain)   . Osteoporosis   . Paresthesia   . Radiation 08/08/13-08/29/13   Bilat.breast 42.72 Gy  . Renal cyst   . Shingles    Past Surgical History:  Procedure Laterality Date  . ABDOMINAL HYSTERECTOMY     ?1970's  . BREAST LUMPECTOMY WITH NEEDLE LOCALIZATION AND AXILLARY SENTINEL LYMPH NODE BX Bilateral 07/07/2013   Procedure: BREAST LUMPECTOMY WITH NEEDLE LOCALIZATION AND AXILLARY SENTINEL LYMPH NODE BIOPIES;  Surgeon: Merrie Roof, MD;  Location: [facility] OR;  Service: General;  Laterality: Bilateral;   Family History  Problem Relation Age of Onset  . Kidney failure Mother   . Hypertension Father   . Esophageal cancer Father   . Coronary artery disease Neg Hx    History  Sexual Activity  . Sexual activity: Yes    Comment: menarche age 15, first live birth [age] yrs of age, P63, menopause 25, no HRT    Outpatient Encounter Prescriptions as of 12/18/2015  Medication Sig  . amLODipine (NORVASC) 5 MG tablet TAKE 1 TABLET (5 MG TOTAL) BY MOUTH DAILY.  Marland Kitchen anastrozole (ARIMIDEX) 1 MG tablet TAKE 1 TABLET (1 MG TOTAL) BY MOUTH DAILY.  Marland Kitchen aspirin 81 MG tablet Take 81 mg by mouth daily.  Marland Kitchen buPROPion (WELLBUTRIN XL) 150 MG 24 hr tablet Take 1 tablet (150 mg total) by mouth daily.  . Carboxymethylcellul-Glycerin (OPTIVE) 0.5-0.9 % SOLN Place 1-2 drops into both eyes at bedtime. For dry eyes  . losartan-hydrochlorothiazide (HYZAAR) 100-25 MG tablet Take 1 tablet by mouth daily.  Marland Kitchen losartan-hydrochlorothiazide (HYZAAR) 100-25 MG tablet TAKE 1 TABLET BY MOUTH DAILY.  . Multiple Vitamins-Minerals (ALIVE ONCE DAILY WOMENS 50+) TABS Take 1 tablet by  mouth daily.  . ranitidine (ZANTAC) 150 MG tablet Take 150 mg by mouth 2 (two) times daily.  . vitamin B-12 (CYANOCOBALAMIN) 1000 MCG tablet Take 1,000 mcg by mouth daily.  Marland Kitchen zolpidem (AMBIEN) 10 MG tablet Take 1 tablet (10 mg total) by mouth at bedtime as needed for sleep.  . [DISCONTINUED] buPROPion (WELLBUTRIN XL) 150 MG 24 hr tablet Take 1 tablet (150 mg total) by mouth daily.  . potassium chloride (KLOR-CON) 8 MEQ tablet Take 1 tablet (8 mEq total) by mouth daily. (Patient not taking: Reported on 12/18/2015)   Facility-Administered Encounter Medications as of 12/18/2015  Medication  . methylPREDNISolone acetate (DEPO-MEDROL) injection 80 mg    Activities of Daily Living No flowsheet data found.  Patient Care Team: Cassandria Anger, MD as PCP - General (  Internal Medicine)    Assessment:     Exercise Activities and Dietary recommendations    Goals    . Eat more fruits and vegetables          Incorporate more fruits and vegetables in her diet       Fall Risk Fall Risk  10/23/2015 09/11/2014  Falls in the past year? No No   Depression Screen PHQ 2/9 Scores 10/23/2015 09/11/2014  PHQ - 2 Score 0 1     Cognitive Testing No flowsheet data found.  Immunization History  Administered Date(s) Administered  . Influenza Split 03/26/2012, 04/11/2015  . Influenza Whole 03/07/2008, 04/26/2009, 05/13/2010  . Influenza,inj,Quad PF,36+ Mos 02/14/2013, 01/17/2014  . Influenza-Unspecified 02/24/2015  . Pneumococcal Conjugate-13 05/16/2013  . Pneumococcal Polysaccharide-23 02/10/2007, 10/23/2015  . Td 12/13/1998  . Tdap 12/18/2015  . Zoster 07/11/2008   Screening Tests Health Maintenance  Topic Date Due  . TETANUS/TDAP  12/12/2008  . INFLUENZA VACCINE  12/25/2015  . DEXA SCAN  Completed  . ZOSTAVAX  Completed  . PNA vac Low Risk Adult  Completed      Plan:    Took Tdap today;  During the course of the visit the patient was educated and counseled about the following  appropriate screening and preventive services:   Vaccines to include Pneumoccal, Influenza, Hepatitis B, Td, Zostavax, HCV  Electrocardiogram  Cardiovascular Disease  Colorectal cancer screening aged out   Bone density screening/ osteopenic; does take Vti D Occasionally ; Recommended vit d when not out in the sun; also discuss weight bearing exercise; as walking; cleaning;   Diabetes screening/neg  Glaucoma screening; eye exam TBS   Mammography/PAP; followed for breast cancer; had mammogram this year   Nutrition counseling / will eat more fruits and vegetables   Patient Instructions (the written plan) was given to the patient.   Wynetta Fines, RN  12/18/2015

## 2015-12-18 NOTE — Progress Notes (Signed)
Pre visit review using our clinic review tool, if applicable. No additional management support is needed unless otherwise documented below in the visit note. 

## 2015-12-18 NOTE — Assessment & Plan Note (Signed)
Doing well now 

## 2015-12-18 NOTE — Assessment & Plan Note (Signed)
On Wellbutrin

## 2015-12-27 DIAGNOSIS — H353131 Nonexudative age-related macular degeneration, bilateral, early dry stage: Secondary | ICD-10-CM | POA: Diagnosis not present

## 2015-12-27 DIAGNOSIS — E119 Type 2 diabetes mellitus without complications: Secondary | ICD-10-CM | POA: Diagnosis not present

## 2016-03-24 DIAGNOSIS — C50411 Malignant neoplasm of upper-outer quadrant of right female breast: Secondary | ICD-10-CM | POA: Diagnosis not present

## 2016-03-24 DIAGNOSIS — C50312 Malignant neoplasm of lower-inner quadrant of left female breast: Secondary | ICD-10-CM | POA: Diagnosis not present

## 2016-04-01 ENCOUNTER — Encounter: Payer: Self-pay | Admitting: Internal Medicine

## 2016-04-01 DIAGNOSIS — N6001 Solitary cyst of right breast: Secondary | ICD-10-CM | POA: Diagnosis not present

## 2016-04-01 DIAGNOSIS — Z853 Personal history of malignant neoplasm of breast: Secondary | ICD-10-CM | POA: Diagnosis not present

## 2016-04-01 DIAGNOSIS — N6320 Unspecified lump in the left breast, unspecified quadrant: Secondary | ICD-10-CM | POA: Diagnosis not present

## 2016-04-01 DIAGNOSIS — N6011 Diffuse cystic mastopathy of right breast: Secondary | ICD-10-CM | POA: Diagnosis not present

## 2016-06-26 ENCOUNTER — Ambulatory Visit: Payer: Medicare Other

## 2016-07-21 ENCOUNTER — Ambulatory Visit (INDEPENDENT_AMBULATORY_CARE_PROVIDER_SITE_OTHER): Payer: Medicare Other | Admitting: Internal Medicine

## 2016-07-21 ENCOUNTER — Other Ambulatory Visit: Payer: Self-pay | Admitting: Adult Health

## 2016-07-21 ENCOUNTER — Encounter: Payer: Self-pay | Admitting: Internal Medicine

## 2016-07-21 ENCOUNTER — Ambulatory Visit: Payer: Medicare Other | Admitting: Internal Medicine

## 2016-07-21 DIAGNOSIS — Z170 Estrogen receptor positive status [ER+]: Principal | ICD-10-CM

## 2016-07-21 DIAGNOSIS — F329 Major depressive disorder, single episode, unspecified: Secondary | ICD-10-CM | POA: Diagnosis not present

## 2016-07-21 DIAGNOSIS — C50411 Malignant neoplasm of upper-outer quadrant of right female breast: Secondary | ICD-10-CM

## 2016-07-21 DIAGNOSIS — I10 Essential (primary) hypertension: Secondary | ICD-10-CM

## 2016-07-21 DIAGNOSIS — F32A Depression, unspecified: Secondary | ICD-10-CM

## 2016-07-21 DIAGNOSIS — Z23 Encounter for immunization: Secondary | ICD-10-CM | POA: Diagnosis not present

## 2016-07-21 MED ORDER — VALSARTAN-HYDROCHLOROTHIAZIDE 320-25 MG PO TABS: 1.0000 | ORAL_TABLET | Freq: Every day | ORAL | 3 refills | Status: DC

## 2016-07-21 MED ORDER — ESCITALOPRAM OXALATE 5 MG PO TABS: 5.0000 mg | ORAL_TABLET | Freq: Every day | ORAL | 5 refills | Status: DC

## 2016-07-21 MED ORDER — ZOLPIDEM TARTRATE 10 MG PO TABS: 10.0000 mg | ORAL_TABLET | Freq: Every evening | ORAL | 1 refills | Status: DC | PRN

## 2016-07-21 MED ORDER — VALSARTAN-HYDROCHLOROTHIAZIDE 320-25 MG PO TABS: 1.0000 | ORAL_TABLET | Freq: Every day | ORAL | 11 refills | Status: DC

## 2016-07-21 MED ORDER — ESCITALOPRAM OXALATE 5 MG PO TABS: 5.0000 mg | ORAL_TABLET | Freq: Every day | ORAL | 1 refills | Status: DC

## 2016-07-21 NOTE — Progress Notes (Signed)
Pre-visit discussion using our clinic review tool. No additional management support is needed unless otherwise documented below in the visit note.  

## 2016-07-21 NOTE — Assessment & Plan Note (Signed)
Seasonal -- worse: cont Wellbutrin; added Lexapro

## 2016-07-21 NOTE — Assessment & Plan Note (Signed)
D/c Losartan HCT and start Valsartan HCT one a day Amlodipine

## 2016-07-21 NOTE — Progress Notes (Signed)
Subjective:  Patient ID: Cynthia Garza, female    DOB: 1933/03/04  Age: 83 y.o. MRN: [id number]  CC: Follow-up (HTN, )   HPI YAMILETT JANOWIAK presents for anxiety, inability to relax F/u insomnia, depression, HTN   Outpatient Medications Prior to Visit  Medication Sig Dispense Refill  . amLODipine (NORVASC) 5 MG tablet TAKE 1 TABLET (5 MG TOTAL) BY MOUTH DAILY. 90 tablet 3  . anastrozole (ARIMIDEX) 1 MG tablet TAKE 1 TABLET (1 MG TOTAL) BY MOUTH DAILY. 90 tablet 3  . aspirin 81 MG tablet Take 81 mg by mouth daily.    Marland Kitchen buPROPion (WELLBUTRIN XL) 150 MG 24 hr tablet Take 1 tablet (150 mg total) by mouth daily. 90 tablet 3  . Carboxymethylcellul-Glycerin (OPTIVE) 0.5-0.9 % SOLN Place 1-2 drops into both eyes at bedtime. For dry eyes    . losartan-hydrochlorothiazide (HYZAAR) 100-25 MG tablet TAKE 1 TABLET BY MOUTH DAILY. 90 tablet 3  . Multiple Vitamins-Minerals (ALIVE ONCE DAILY WOMENS 50+) TABS Take 1 tablet by mouth daily.    . potassium chloride (KLOR-CON) 8 MEQ tablet Take 1 tablet (8 mEq total) by mouth daily. (Patient not taking: Reported on 12/18/2015) 30 tablet 11  . ranitidine (ZANTAC) 150 MG tablet Take 150 mg by mouth 2 (two) times daily.    . vitamin B-12 (CYANOCOBALAMIN) 1000 MCG tablet Take 1,000 mcg by mouth daily.    Marland Kitchen zolpidem (AMBIEN) 10 MG tablet Take 1 tablet (10 mg total) by mouth at bedtime as needed for sleep. 90 tablet 1  . losartan-hydrochlorothiazide (HYZAAR) 100-25 MG tablet Take 1 tablet by mouth daily. 90 tablet 3   Facility-Administered Medications Prior to Visit  Medication Dose Route Frequency Provider Last Rate Last Dose  . methylPREDNISolone acetate (DEPO-MEDROL) injection 80 mg  80 mg Intra-articular Once Khyan Oats V, MD        ROS Review of Systems  Constitutional: Positive for fatigue. Negative for activity change, appetite change, chills and unexpected weight change.  HENT: Negative for congestion, mouth sores and sinus pressure.     Eyes: Negative for visual disturbance.  Respiratory: Negative for cough and chest tightness.   Gastrointestinal: Negative for abdominal pain and nausea.  Genitourinary: Negative for difficulty urinating, frequency and vaginal pain.  Musculoskeletal: Negative for back pain and gait problem.  Skin: Negative for pallor and rash.  Neurological: Negative for dizziness, tremors, weakness, numbness and headaches.  Psychiatric/Behavioral: Positive for decreased concentration and sleep disturbance. Negative for confusion. The patient is nervous/anxious.     Objective:  BP (!) 142/86   Pulse 73   Resp 16   Ht 5\' 1"  (1.549 m)   Wt 132 lb (59.9 kg)   SpO2 97%   BMI 24.94 kg/m   BP Readings from Last 3 Encounters:  07/21/16 (!) 142/86  12/18/15 140/78  10/23/15 130/84    Wt Readings from Last 3 Encounters:  07/21/16 132 lb (59.9 kg)  12/18/15 133 lb (60.3 kg)  10/23/15 132 lb (59.9 kg)    Physical Exam  Constitutional: She appears well-developed. No distress.  HENT:  Head: Normocephalic.  Right Ear: External ear normal.  Left Ear: External ear normal.  Nose: Nose normal.  Mouth/Throat: Oropharynx is clear and moist.  Eyes: Conjunctivae are normal. Pupils are equal, round, and reactive to light. Right eye exhibits no discharge. Left eye exhibits no discharge.  Neck: Normal range of motion. Neck supple. No JVD present. No tracheal deviation present. No thyromegaly present.  Cardiovascular: Normal rate,  regular rhythm and normal heart sounds.   Pulmonary/Chest: No stridor. No respiratory distress. She has no wheezes.  Abdominal: Soft. Bowel sounds are normal. She exhibits no distension and no mass. There is no tenderness. There is no rebound and no guarding.  Musculoskeletal: She exhibits no edema or tenderness.  Lymphadenopathy:    She has no cervical adenopathy.  Neurological: She displays normal reflexes. No cranial nerve deficit. She exhibits normal muscle tone. Coordination  normal.  Skin: No rash noted. No erythema.  Psychiatric: She has a normal mood and affect. Her behavior is normal. Judgment and thought content normal.    Lab Results  Component Value Date   WBC 7.6 12/18/2015   HGB 13.6 12/18/2015   HCT 40.3 12/18/2015   PLT 217.0 12/18/2015   GLUCOSE 103 (H) 12/18/2015   CHOL 224 (H) 12/18/2015   TRIG 200.0 (H) 12/18/2015   HDL 52.30 12/18/2015   LDLDIRECT 144.7 02/09/2012   LDLCALC 132 (H) 12/18/2015   ALT 7 12/18/2015   AST 14 12/18/2015   NA 141 12/18/2015   K 3.7 12/18/2015   CL 103 12/18/2015   CREATININE 0.78 12/18/2015   BUN 14 12/18/2015   CO2 29 12/18/2015   TSH 1.97 12/18/2015   INR 0.95 01/15/2012   HGBA1C 5.7 01/02/2011    No results found.  Assessment & Plan:   There are no diagnoses linked to this encounter. I am having Ms. Flow maintain her ranitidine, Carboxymethylcellul-Glycerin, aspirin, vitamin B-12, ALIVE ONCE DAILY WOMENS 50+, potassium chloride, anastrozole, amLODipine, zolpidem, losartan-hydrochlorothiazide, and buPROPion. We will continue to administer methylPREDNISolone acetate.  No orders of the defined types were placed in this encounter.    Follow-up: No Follow-up on file.  Walker Kehr, MD

## 2016-07-21 NOTE — Patient Instructions (Addendum)
Stop Losartan HCT and start Valsartan HCT one a day Continue Amlodipine

## 2016-07-22 ENCOUNTER — Other Ambulatory Visit (HOSPITAL_BASED_OUTPATIENT_CLINIC_OR_DEPARTMENT_OTHER): Payer: Medicare Other

## 2016-07-22 ENCOUNTER — Ambulatory Visit (HOSPITAL_BASED_OUTPATIENT_CLINIC_OR_DEPARTMENT_OTHER): Payer: Medicare Other | Admitting: Oncology

## 2016-07-22 VITALS — BP 151/72 | HR 76 | Temp 98.0°F | Resp 18 | Ht 61.0 in | Wt 133.2 lb

## 2016-07-22 DIAGNOSIS — Z79811 Long term (current) use of aromatase inhibitors: Secondary | ICD-10-CM | POA: Diagnosis not present

## 2016-07-22 DIAGNOSIS — C50811 Malignant neoplasm of overlapping sites of right female breast: Secondary | ICD-10-CM

## 2016-07-22 DIAGNOSIS — C50312 Malignant neoplasm of lower-inner quadrant of left female breast: Secondary | ICD-10-CM

## 2016-07-22 DIAGNOSIS — Z170 Estrogen receptor positive status [ER+]: Secondary | ICD-10-CM | POA: Diagnosis not present

## 2016-07-22 DIAGNOSIS — C50411 Malignant neoplasm of upper-outer quadrant of right female breast: Secondary | ICD-10-CM

## 2016-07-22 DIAGNOSIS — M818 Other osteoporosis without current pathological fracture: Secondary | ICD-10-CM

## 2016-07-22 LAB — CBC WITH DIFFERENTIAL/PLATELET
BASO%: 0.9 % (ref 0.0–2.0)
BASOS ABS: 0.1 10*3/uL (ref 0.0–0.1)
EOS ABS: 0.2 10*3/uL (ref 0.0–0.5)
EOS%: 2.6 % (ref 0.0–7.0)
HEMATOCRIT: 41.7 % (ref 34.8–46.6)
HEMOGLOBIN: 14 g/dL (ref 11.6–15.9)
LYMPH#: 1.7 10*3/uL (ref 0.9–3.3)
LYMPH%: 21.4 % (ref 14.0–49.7)
MCH: 28 pg (ref 25.1–34.0)
MCHC: 33.5 g/dL (ref 31.5–36.0)
MCV: 83.3 fL (ref 79.5–101.0)
MONO#: 0.7 10*3/uL (ref 0.1–0.9)
MONO%: 8.9 % (ref 0.0–14.0)
NEUT#: 5.3 10*3/uL (ref 1.5–6.5)
NEUT%: 66.2 % (ref 38.4–76.8)
Platelets: 235 10*3/uL (ref 145–400)
RBC: 5 10*6/uL (ref 3.70–5.45)
RDW: 14 % (ref 11.2–14.5)
WBC: 8 10*3/uL (ref 3.9–10.3)

## 2016-07-22 LAB — COMPREHENSIVE METABOLIC PANEL
ALBUMIN: 4 g/dL (ref 3.5–5.0)
ALT: 12 U/L (ref 0–55)
ANION GAP: 10 meq/L (ref 3–11)
AST: 15 U/L (ref 5–34)
Alkaline Phosphatase: 70 U/L (ref 40–150)
BUN: 18.7 mg/dL (ref 7.0–26.0)
CALCIUM: 9.5 mg/dL (ref 8.4–10.4)
CO2: 27 mEq/L (ref 22–29)
Chloride: 106 mEq/L (ref 98–109)
Creatinine: 1 mg/dL (ref 0.6–1.1)
EGFR: 54 mL/min/{1.73_m2} — AB (ref >90)
Glucose: 132 mg/dl (ref 70–140)
POTASSIUM: 3.7 meq/L (ref 3.5–5.1)
Sodium: 142 mEq/L (ref 136–145)
Total Bilirubin: 0.52 mg/dL (ref 0.20–1.20)
Total Protein: 6.8 g/dL (ref 6.4–8.3)

## 2016-07-22 MED ORDER — ANASTROZOLE 1 MG PO TABS: ORAL_TABLET | ORAL | 3 refills | Status: DC

## 2016-07-22 NOTE — Progress Notes (Signed)
[place]  [phone] [phone]     ID: Cynthia Garza OB: November 02, 1932  MR#: [id number]  KNL#:[id number]  PCP: Walker Kehr, MD GYN:   SU: Autumn Messing OTHER MD: Thea Silversmith  CHIEF COMPLAINT: estrogen receptor positive breast cancer  CURRENT TREATMENT: anastrozole  BREAST CANCER HISTORY: From Dr.Khan's 06/08/2013 note:  "1. [The patient] underwent a six-month followup mammogram for left-sided abnormalities. On her mammogram she was noted to have a right breast mass at the 12:00 position. By ultrasound it measured 1.5 cm. Patient went on to have a biopsy performed of this mass. This revealed intermediate grade invasive ductal carcinoma ER positive PR positive HER-2/neu negative with a proliferation marker Ki-67 22%. She had MRI of the breasts performed. The MRI showed a 1.3 cm enhancement known for the by biopsy with 2 small satellite lesion. She was also noted to have a another linear enhancement as well in the right breast. On the left side she was noted to have a 7 mm mass by ultrasound this measured 8 mm this was at the 7:00 position. Patient had a biopsy of this performed. By pathology report report this is consistent with an invasive cancer.   2. status post lumpectomy performed on 07/07/2013 of the left breast. The final pathology revealed a 0.7 cm invasive ductal carcinoma with ductal carcinoma in situ. Sentinel lymph node was negative for metastatic disease. Tumor was ER positive PR positive HER-2/neu negative with a proliferation marker Ki-67 13%. Stage I (T1 N0)  #3 patient had Oncotype DX testing performed her breast cancer recurrence score was 0 giving her a 3% risk of distant recurrence with tamoxifen for 5 years. Patient will not need chemotherapy but will need antiestrogen therapy"  Her subsequent history is as detailed below.  INTERVAL HISTORY: Cynthia Garza returns today for follow-up of her estrogen receptor positive breast cancer.  She continues on anastrozole, with good tolerance. She denies problems with hot flashes. Vaginal dryness and arthralgias myalgias are not problems for her. She obtains the drug at approximately $6 per month.   She had a change in her right breast which was evaluated with mammography and ultrasonography in November and was found to only be oil cysts.  REVIEW OF SYSTEMS: Cynthia Garza does not exercise regularly. She is normally active for her age. She is concerned about her blood pressure and tells me her medications were seen recently increased but she has not yet started taking the new doses. Sometimes her right breast feels a bit sore. She has an area in her upper right back that is itchy. This is long-standing. Aside from these issues a detailed review of systems today was noncontributory  PAST MEDICAL HISTORY: Past Medical History:  Diagnosis Date  . Anxiety   . Breast cancer ([place]) 2015   ER+/PR+/Her2-  . Depression   . Dyslipidemia   . GERD (gastroesophageal reflux disease)   . Heart murmur   . Hemangioma of intra-abdominal structures   . HTN (hypertension)   . Insomnia   . LBP (low back pain)   . Osteoporosis   . Paresthesia   . Radiation 08/08/13-08/29/13   Bilat.breast 42.72 Gy  . Renal cyst   . Shingles     PAST SURGICAL HISTORY: Past Surgical History:  Procedure Laterality Date  . ABDOMINAL HYSTERECTOMY     ?1970's  . BREAST LUMPECTOMY WITH NEEDLE LOCALIZATION AND AXILLARY SENTINEL LYMPH NODE BX Bilateral 07/07/2013   Procedure: BREAST LUMPECTOMY WITH NEEDLE LOCALIZATION AND AXILLARY SENTINEL LYMPH NODE  BIOPIES;  Surgeon: Merrie Roof, MD;  Location: [facility];  Service: General;  Laterality: Bilateral;    FAMILY HISTORY Family History  Problem Relation Age of Onset  . Kidney failure Mother   . Hypertension Father   . Esophageal cancer Father   . Coronary artery disease Neg Hx     GYNECOLOGIC HISTORY:  Menarche age 11, first live birth age 73, she is [place] P2. She  underwent menopause in the late 1970s. She did not take hormone replacement.  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 Her husband [name] used to work in Actuary. The patient's daughter [name] used to be a Education officer, museum but now works in Engineer, mining as. Daughter [name] is a homemaker. The patient has 4 grandchildren. She attends a local [place]: [place]   HEALTH MAINTENANCE: Social History  Substance Use Topics  . Smoking status: Never Smoker  . Smokeless tobacco: Never Used  . Alcohol use No     Colonoscopy:  PAP:  Bone density:  Lipid panel:  Allergies  Allergen Reactions  . Cholestoff [Plant Sterols And Stanols]   . Simvastatin Other (See Comments)    aches  . Diflucan [Fluconazole] Rash    Current Outpatient Prescriptions  Medication Sig Dispense Refill  . amLODipine (NORVASC) 5 MG tablet TAKE 1 TABLET (5 MG TOTAL) BY MOUTH DAILY. 90 tablet 3  . anastrozole (ARIMIDEX) 1 MG tablet TAKE 1 TABLET (1 MG TOTAL) BY MOUTH DAILY. 90 tablet 3  . aspirin 81 MG tablet Take 81 mg by mouth daily.    Marland Kitchen buPROPion (WELLBUTRIN XL) 150 MG 24 hr tablet Take 1 tablet (150 mg total) by mouth daily. 90 tablet 3  . Carboxymethylcellul-Glycerin (OPTIVE) 0.5-0.9 % SOLN Place 1-2 drops into both eyes at bedtime. For dry eyes    . escitalopram (LEXAPRO) 5 MG tablet Take 1 tablet (5 mg total) by mouth daily. 90 tablet 1  . Multiple Vitamins-Minerals (ALIVE ONCE DAILY WOMENS 50+) TABS Take 1 tablet by mouth daily.    . potassium chloride (KLOR-CON) 8 MEQ tablet Take 1 tablet (8 mEq total) by mouth daily. (Patient not taking: Reported on 12/18/2015) 30 tablet 11  . ranitidine (ZANTAC) 150 MG tablet Take 150 mg by mouth 2 (two) times daily.    . valsartan-hydrochlorothiazide (DIOVAN HCT) 320-25 MG tablet Take 1 tablet by mouth daily. 90 tablet 3  . vitamin B-12 (CYANOCOBALAMIN) 1000 MCG tablet  Take 1,000 mcg by mouth daily.    Marland Kitchen zolpidem (AMBIEN) 10 MG tablet Take 1 tablet (10 mg total) by mouth at bedtime as needed for sleep. 90 tablet 1   Current Facility-Administered Medications  Medication Dose Route Frequency Provider Last Rate Last Dose  . methylPREDNISolone acetate (DEPO-MEDROL) injection 80 mg  80 mg Intra-articular Once Aleksei Plotnikov V, MD        OBJECTIVE: Older white woman In no acute distress  Vitals:   07/22/16 1416  BP: (!) 151/72  Pulse: 76  Resp: 18  Temp: 98 F (36.7 C)     Body mass index is 25.17 kg/m.    ECOG FS:1 - Symptomatic but completely ambulatory  Sclerae unicteric, pupils round and equal Oropharynx clear and moist-- no thrush or other lesions No cervical or supraclavicular adenopathy Lungs no rales or rhonchi Heart regular rate and rhythm Abd soft, nontender, positive bowel sounds MSK no focal spinal tenderness,  no upper extremity lymphedema Neuro: nonfocal, well oriented, appropriate affect Breasts: Both breasts are status post prior lumpectomy. I do not palpate any suspicious masses. There are no skin or nipple changes of concern. Both axillae are benign.   LAB RESULTS:  CMP     Component Value Date/Time   NA 141 12/18/2015 0936   NA 142 07/11/2015 1401   K 3.7 12/18/2015 0936   K 4.1 07/11/2015 1401   CL 103 12/18/2015 0936   CO2 29 12/18/2015 0936   CO2 28 07/11/2015 1401   GLUCOSE 103 (H) 12/18/2015 0936   GLUCOSE 96 07/11/2015 1401   BUN 14 12/18/2015 0936   BUN 21.5 07/11/2015 1401   CREATININE 0.78 12/18/2015 0936   CREATININE 0.9 07/11/2015 1401   CALCIUM 9.4 12/18/2015 0936   CALCIUM 9.5 07/11/2015 1401   PROT 6.6 12/18/2015 0936   PROT 6.8 07/11/2015 1401   ALBUMIN 4.1 12/18/2015 0936   ALBUMIN 3.9 07/11/2015 1401   AST 14 12/18/2015 0936   AST 12 07/11/2015 1401   ALT 7 12/18/2015 0936   ALT <9 07/11/2015 1401   ALKPHOS 60 12/18/2015 0936   ALKPHOS 71 07/11/2015 1401   BILITOT 0.6 12/18/2015 0936    BILITOT 0.39 07/11/2015 1401   GFRNONAA 62 (L) 06/30/2013 1421   GFRAA 72 (L) 06/30/2013 1421    I No results found for: SPEP  Lab Results  Component Value Date   WBC 8.0 07/22/2016   NEUTROABS 5.3 07/22/2016   HGB 14.0 07/22/2016   HCT 41.7 07/22/2016   MCV 83.3 07/22/2016   PLT 235 07/22/2016      Chemistry      Component Value Date/Time   NA 141 12/18/2015 0936   NA 142 07/11/2015 1401   K 3.7 12/18/2015 0936   K 4.1 07/11/2015 1401   CL 103 12/18/2015 0936   CO2 29 12/18/2015 0936   CO2 28 07/11/2015 1401   BUN 14 12/18/2015 0936   BUN 21.5 07/11/2015 1401   CREATININE 0.78 12/18/2015 0936   CREATININE 0.9 07/11/2015 1401      Component Value Date/Time   CALCIUM 9.4 12/18/2015 0936   CALCIUM 9.5 07/11/2015 1401   ALKPHOS 60 12/18/2015 0936   ALKPHOS 71 07/11/2015 1401   AST 14 12/18/2015 0936   AST 12 07/11/2015 1401   ALT 7 12/18/2015 0936   ALT <9 07/11/2015 1401   BILITOT 0.6 12/18/2015 0936   BILITOT 0.39 07/11/2015 1401       No results found for: LABCA2  No components found for: LABCA125  No results for input(s): INR in the last 168 hours.  Urinalysis    Component Value Date/Time   COLORURINE YELLOW 12/18/2015 0936   APPEARANCEUR CLEAR 12/18/2015 0936   LABSPEC 1.010 12/18/2015 0936   PHURINE 7.0 12/18/2015 0936   GLUCOSEU NEGATIVE 12/18/2015 0936   HGBUR NEGATIVE 12/18/2015 0936   BILIRUBINUR NEGATIVE 12/18/2015 0936   BILIRUBINUR neg 12/03/2011 1540   KETONESUR NEGATIVE 12/18/2015 0936   PROTEINUR 100 (A) 12/04/2011 1640   UROBILINOGEN 0.2 12/18/2015 0936   NITRITE NEGATIVE 12/18/2015 0936   LEUKOCYTESUR NEGATIVE 12/18/2015 0936    STUDIES: Bilateral diagnostic mammography at [facility] for evaluation of a right breast area of concern found the breast density to be category B mammography there was no distinct mass but there was what appeared to be an oil cyst in the right breast at 9:00 in the retroareolar position. Ultrasound at [facility]  04/01/2016 confirms that the area of  concern in the right breast at the areolar margin consistent of oil cysts. In the upper outer left breast at the site of reported fullness there was no abnormality.  ASSESSMENT: 83 y.o. [place] woman with bilateral breast cancers  (1) RIGHT BREAST: Status post right lumpectomy and right axillary lymph node sampling 07/07/2013 for a pT1c pN0, stage IA invasive ductal carcinoma, grade 2, estrogen receptor 100% positive, progesterone receptor 97% positive, with an MIB-1 of 22% and no HER-2 amplification  (a) Oncotype DX score of 0 predicts an outside the breast recurrence of 3% within 10 years if the patient's only systemic treatment is tamoxifen for 5 years  (b) adjuvant radiation completed 08/29/2013  (2) LEFT BREAST: Status post left lumpectomy 07/07/2013 for a pT1b pN0, stage IA invasive ductal carcinoma, grade 2, estrogen receptor 100% positive, progesterone receptor 54% positive, with an MIB-1 of 13% and no HER-2 amplification  (b) Completed adjuvant radiation 08/29/2013  (3) SYSTEMIC THERAPY:   (a) started on anastrozole 09/23/2013   (i) bone density at [facility] 07/12/2014 was normal with a T score of -0.9.  PLAN: Cynthia Garza is now 3 years out from definitive surgery for her breast cancers with no evidence of disease recurrence. This is very favorable.  She is tolerating anastrozole well and the plan will be to continue that for a total of 5 years, which will take [place] to May 2020.  She will be due for repeat bone density study this year and I have entered that order.   Chauncey Cruel, MD   07/22/2016 2:23 PM

## 2016-08-01 ENCOUNTER — Other Ambulatory Visit: Payer: Self-pay | Admitting: *Deleted

## 2016-08-01 MED ORDER — ANASTROZOLE 1 MG PO TABS: ORAL_TABLET | ORAL | 3 refills | Status: DC

## 2016-08-20 ENCOUNTER — Telehealth: Payer: Self-pay

## 2016-08-20 NOTE — Telephone Encounter (Signed)
PA for Zolpidem Tartrate 10 mg initiated through [email or web address]  Key [id number]

## 2016-08-20 NOTE — Telephone Encounter (Signed)
PA approved for zolpidem

## 2016-09-01 ENCOUNTER — Encounter: Payer: Self-pay | Admitting: Internal Medicine

## 2016-09-01 ENCOUNTER — Ambulatory Visit (INDEPENDENT_AMBULATORY_CARE_PROVIDER_SITE_OTHER): Payer: Medicare Other | Admitting: Internal Medicine

## 2016-09-01 DIAGNOSIS — I10 Essential (primary) hypertension: Secondary | ICD-10-CM | POA: Diagnosis not present

## 2016-09-01 DIAGNOSIS — F5101 Primary insomnia: Secondary | ICD-10-CM | POA: Diagnosis not present

## 2016-09-01 DIAGNOSIS — M2550 Pain in unspecified joint: Secondary | ICD-10-CM | POA: Insufficient documentation

## 2016-09-01 DIAGNOSIS — E876 Hypokalemia: Secondary | ICD-10-CM | POA: Diagnosis not present

## 2016-09-01 DIAGNOSIS — E785 Hyperlipidemia, unspecified: Secondary | ICD-10-CM

## 2016-09-01 NOTE — Progress Notes (Signed)
Subjective:  Patient ID: Cynthia Garza, female    DOB: 1932-08-04  Age: 83 y.o. MRN: [id number]  CC: No chief complaint on file.   HPI VIVI PICCIRILLI presents for HTN, depression, insomnia f/u  Outpatient Medications Prior to Visit  Medication Sig Dispense Refill  . amLODipine (NORVASC) 5 MG tablet TAKE 1 TABLET (5 MG TOTAL) BY MOUTH DAILY. 90 tablet 3  . anastrozole (ARIMIDEX) 1 MG tablet TAKE 1 TABLET (1 MG TOTAL) BY MOUTH DAILY. 90 tablet 3  . aspirin 81 MG tablet Take 81 mg by mouth daily.    Marland Kitchen buPROPion (WELLBUTRIN XL) 150 MG 24 hr tablet Take 1 tablet (150 mg total) by mouth daily. 90 tablet 3  . Carboxymethylcellul-Glycerin (OPTIVE) 0.5-0.9 % SOLN Place 1-2 drops into both eyes at bedtime. For dry eyes    . Multiple Vitamins-Minerals (ALIVE ONCE DAILY WOMENS 50+) TABS Take 1 tablet by mouth daily.    . ranitidine (ZANTAC) 150 MG tablet Take 150 mg by mouth 2 (two) times daily.    . valsartan-hydrochlorothiazide (DIOVAN HCT) 320-25 MG tablet Take 1 tablet by mouth daily. 90 tablet 3  . vitamin B-12 (CYANOCOBALAMIN) 1000 MCG tablet Take 1,000 mcg by mouth daily.    Marland Kitchen zolpidem (AMBIEN) 10 MG tablet Take 1 tablet (10 mg total) by mouth at bedtime as needed for sleep. 90 tablet 1  . escitalopram (LEXAPRO) 5 MG tablet Take 1 tablet (5 mg total) by mouth daily. (Patient not taking: Reported on 09/01/2016) 90 tablet 1  . potassium chloride (KLOR-CON) 8 MEQ tablet Take 1 tablet (8 mEq total) by mouth daily. (Patient not taking: Reported on 12/18/2015) 30 tablet 11   Facility-Administered Medications Prior to Visit  Medication Dose Route Frequency Provider Last Rate Last Dose  . methylPREDNISolone acetate (DEPO-MEDROL) injection 80 mg  80 mg Intra-articular Once Aleksei Plotnikov V, MD        ROS Review of Systems  Constitutional: Positive for fatigue. Negative for activity change, appetite change, chills and unexpected weight change.  HENT: Negative for congestion, mouth sores  and sinus pressure.   Eyes: Negative for visual disturbance.  Respiratory: Negative for cough and chest tightness.   Gastrointestinal: Negative for abdominal pain and nausea.  Genitourinary: Negative for difficulty urinating, frequency and vaginal pain.  Musculoskeletal: Positive for arthralgias. Negative for back pain and gait problem.  Skin: Negative for pallor and rash.  Neurological: Negative for dizziness, tremors, weakness, numbness and headaches.  Psychiatric/Behavioral: Positive for sleep disturbance. Negative for confusion and suicidal ideas. The patient is nervous/anxious.     Objective:  BP 130/82 (BP Location: Left Arm, Patient Position: Sitting, Cuff Size: Normal)   Pulse 71   Temp 98 F (36.7 C) (Oral)   Ht 5\' 1"  (1.549 m)   Wt 135 lb (61.2 kg)   SpO2 99%   BMI 25.51 kg/m   BP Readings from Last 3 Encounters:  09/01/16 130/82  07/22/16 (!) 151/72  07/21/16 (!) 142/86    Wt Readings from Last 3 Encounters:  09/01/16 135 lb (61.2 kg)  07/22/16 133 lb 3.2 oz (60.4 kg)  07/21/16 132 lb (59.9 kg)    Physical Exam  Constitutional: She appears well-developed. No distress.  HENT:  Head: Normocephalic.  Right Ear: External ear normal.  Left Ear: External ear normal.  Nose: Nose normal.  Mouth/Throat: Oropharynx is clear and moist.  Eyes: Conjunctivae are normal. Pupils are equal, round, and reactive to light. Right eye exhibits no discharge. Left eye  exhibits no discharge.  Neck: Normal range of motion. Neck supple. No JVD present. No tracheal deviation present. No thyromegaly present.  Cardiovascular: Normal rate, regular rhythm and normal heart sounds.   Pulmonary/Chest: No stridor. No respiratory distress. She has no wheezes.  Abdominal: Soft. Bowel sounds are normal. She exhibits no distension and no mass. There is no tenderness. There is no rebound and no guarding.  Musculoskeletal: She exhibits tenderness. She exhibits no edema.  Lymphadenopathy:    She has  no cervical adenopathy.  Neurological: She displays normal reflexes. No cranial nerve deficit. She exhibits normal muscle tone. Coordination normal.  Skin: No rash noted. No erythema.  Psychiatric: She has a normal mood and affect. Her behavior is normal. Judgment and thought content normal.    Lab Results  Component Value Date   WBC 8.0 07/22/2016   HGB 14.0 07/22/2016   HCT 41.7 07/22/2016   PLT 235 07/22/2016   GLUCOSE 132 07/22/2016   CHOL 224 (H) 12/18/2015   TRIG 200.0 (H) 12/18/2015   HDL 52.30 12/18/2015   LDLDIRECT 144.7 02/09/2012   LDLCALC 132 (H) 12/18/2015   ALT 12 07/22/2016   AST 15 07/22/2016   NA 142 07/22/2016   K 3.7 07/22/2016   CL 103 12/18/2015   CREATININE 1.0 07/22/2016   BUN 18.7 07/22/2016   CO2 27 07/22/2016   TSH 1.97 12/18/2015   INR 0.95 01/15/2012   HGBA1C 5.7 01/02/2011    No results found.  Assessment & Plan:   There are no diagnoses linked to this encounter. I am having Ms. Phetteplace maintain her ranitidine, Carboxymethylcellul-Glycerin, aspirin, vitamin B-12, ALIVE ONCE DAILY WOMENS 50+, potassium chloride, amLODipine, buPROPion, escitalopram, valsartan-hydrochlorothiazide, zolpidem, and anastrozole. We will continue to administer methylPREDNISolone acetate.  No orders of the defined types were placed in this encounter.    Follow-up: No Follow-up on file.  Walker Kehr, MD

## 2016-09-01 NOTE — Assessment & Plan Note (Signed)
  On diet  

## 2016-09-01 NOTE — Assessment & Plan Note (Signed)
Diovan HCT Norvasc

## 2016-09-01 NOTE — Assessment & Plan Note (Signed)
Zolpidem Rx  Potential benefits of a long term benzodiazepines  use as well as potential risks  and complications were explained to the patient and were aknowledged.

## 2016-09-01 NOTE — Progress Notes (Signed)
Pre visit review using our clinic review tool, if applicable. No additional management support is needed unless otherwise documented below in the visit note. 

## 2016-09-01 NOTE — Assessment & Plan Note (Signed)
?  Arimidex related Pt declined Rx

## 2016-09-01 NOTE — Assessment & Plan Note (Signed)
On KCl 

## 2016-09-01 NOTE — Patient Instructions (Signed)
Asper cream Rice bag

## 2016-09-30 ENCOUNTER — Other Ambulatory Visit: Payer: Self-pay | Admitting: Internal Medicine

## 2016-12-02 ENCOUNTER — Encounter: Payer: Self-pay | Admitting: Internal Medicine

## 2016-12-02 ENCOUNTER — Ambulatory Visit (INDEPENDENT_AMBULATORY_CARE_PROVIDER_SITE_OTHER): Payer: Medicare Other | Admitting: Internal Medicine

## 2016-12-02 ENCOUNTER — Other Ambulatory Visit (INDEPENDENT_AMBULATORY_CARE_PROVIDER_SITE_OTHER): Payer: Medicare Other

## 2016-12-02 DIAGNOSIS — F5101 Primary insomnia: Secondary | ICD-10-CM | POA: Diagnosis not present

## 2016-12-02 DIAGNOSIS — R413 Other amnesia: Secondary | ICD-10-CM | POA: Diagnosis not present

## 2016-12-02 DIAGNOSIS — I10 Essential (primary) hypertension: Secondary | ICD-10-CM | POA: Diagnosis not present

## 2016-12-02 DIAGNOSIS — F329 Major depressive disorder, single episode, unspecified: Secondary | ICD-10-CM | POA: Diagnosis not present

## 2016-12-02 DIAGNOSIS — F32A Depression, unspecified: Secondary | ICD-10-CM

## 2016-12-02 LAB — BASIC METABOLIC PANEL
BUN: 20 mg/dL (ref 6–23)
CALCIUM: 9.3 mg/dL (ref 8.4–10.5)
CHLORIDE: 103 meq/L (ref 96–112)
CO2: 27 mEq/L (ref 19–32)
CREATININE: 0.87 mg/dL (ref 0.40–1.20)
GFR: 66.28 mL/min (ref >60.00)
Glucose, Bld: 123 mg/dL — ABNORMAL HIGH (ref 70–99)
Potassium: 3.5 mEq/L (ref 3.5–5.1)
Sodium: 141 mEq/L (ref 135–145)

## 2016-12-02 NOTE — Assessment & Plan Note (Signed)
Doing well 

## 2016-12-02 NOTE — Assessment & Plan Note (Signed)
Amlodipine, Diovan HCT Labs

## 2016-12-02 NOTE — Patient Instructions (Signed)
MC well w/Jill 

## 2016-12-02 NOTE — Assessment & Plan Note (Signed)
Lexapro - d/c'd Zolpidem prn

## 2016-12-02 NOTE — Progress Notes (Signed)
Subjective:  Patient ID: Cynthia Garza, female    DOB: 1932-08-15  Age: 83 y.o. MRN: [id number]  CC: No chief complaint on file.   HPI Cynthia Garza presents for depression, breast ca, HTN f/u. She stopped Lexapro due to fatigue. C/o insomnia - Zolpidem prn not working all the time.  Outpatient Medications Prior to Visit  Medication Sig Dispense Refill  . amLODipine (NORVASC) 5 MG tablet Take 1 tablet (5 mg total) by mouth daily. Yearly physical w/labs due in July must see MD for refills 90 tablet 0  . anastrozole (ARIMIDEX) 1 MG tablet TAKE 1 TABLET (1 MG TOTAL) BY MOUTH DAILY. 90 tablet 3  . aspirin 81 MG tablet Take 81 mg by mouth daily.    Marland Kitchen buPROPion (WELLBUTRIN XL) 150 MG 24 hr tablet Take 1 tablet (150 mg total) by mouth daily. 90 tablet 3  . Carboxymethylcellul-Glycerin (OPTIVE) 0.5-0.9 % SOLN Place 1-2 drops into both eyes at bedtime. For dry eyes    . Multiple Vitamins-Minerals (ALIVE ONCE DAILY WOMENS 50+) TABS Take 1 tablet by mouth daily.    . potassium chloride (KLOR-CON) 8 MEQ tablet Take 1 tablet (8 mEq total) by mouth daily. 30 tablet 11  . ranitidine (ZANTAC) 150 MG tablet Take 150 mg by mouth 2 (two) times daily.    . valsartan-hydrochlorothiazide (DIOVAN HCT) 320-25 MG tablet Take 1 tablet by mouth daily. 90 tablet 3  . vitamin B-12 (CYANOCOBALAMIN) 1000 MCG tablet Take 1,000 mcg by mouth daily.    Marland Kitchen zolpidem (AMBIEN) 10 MG tablet Take 1 tablet (10 mg total) by mouth at bedtime as needed for sleep. 90 tablet 1  . escitalopram (LEXAPRO) 5 MG tablet Take 1 tablet (5 mg total) by mouth daily. (Patient not taking: Reported on 09/01/2016) 90 tablet 1   Facility-Administered Medications Prior to Visit  Medication Dose Route Frequency Provider Last Rate Last Dose  . methylPREDNISolone acetate (DEPO-MEDROL) injection 80 mg  80 mg Intra-articular Once Dorrene Bently, Evie Lacks, MD        ROS Review of Systems  Constitutional: Positive for fatigue. Negative for activity  change, appetite change, chills and unexpected weight change.  HENT: Negative for congestion, mouth sores and sinus pressure.   Eyes: Negative for visual disturbance.  Respiratory: Negative for cough and chest tightness.   Gastrointestinal: Negative for abdominal pain and nausea.  Genitourinary: Negative for difficulty urinating, frequency and vaginal pain.  Musculoskeletal: Negative for back pain and gait problem.  Skin: Negative for pallor and rash.  Neurological: Negative for dizziness, tremors, weakness, numbness and headaches.  Psychiatric/Behavioral: Negative for confusion and sleep disturbance.    Objective:  BP 130/64 (BP Location: Left Arm, Patient Position: Sitting, Cuff Size: Normal)   Pulse 80   Temp 98.3 F (36.8 C) (Oral)   Ht 5\' 1"  (1.549 m)   Wt 139 lb (63 kg)   SpO2 99%   BMI 26.26 kg/m   BP Readings from Last 3 Encounters:  12/02/16 130/64  09/01/16 130/82  07/22/16 (!) 151/72    Wt Readings from Last 3 Encounters:  12/02/16 139 lb (63 kg)  09/01/16 135 lb (61.2 kg)  07/22/16 133 lb 3.2 oz (60.4 kg)    Physical Exam  Constitutional: She appears well-developed. No distress.  HENT:  Head: Normocephalic.  Right Ear: External ear normal.  Left Ear: External ear normal.  Nose: Nose normal.  Mouth/Throat: Oropharynx is clear and moist.  Eyes: Conjunctivae are normal. Pupils are equal, round, and reactive  to light. Right eye exhibits no discharge. Left eye exhibits no discharge.  Neck: Normal range of motion. Neck supple. No JVD present. No tracheal deviation present. No thyromegaly present.  Cardiovascular: Normal rate, regular rhythm and normal heart sounds.   Pulmonary/Chest: No stridor. No respiratory distress. She has no wheezes.  Abdominal: Soft. Bowel sounds are normal. She exhibits no distension and no mass. There is no tenderness. There is no rebound and no guarding.  Musculoskeletal: She exhibits no edema or tenderness.  Lymphadenopathy:    She  has no cervical adenopathy.  Neurological: She displays normal reflexes. No cranial nerve deficit. She exhibits normal muscle tone. Coordination normal.  Skin: No rash noted. No erythema.  Psychiatric: She has a normal mood and affect. Her behavior is normal. Judgment and thought content normal.    Lab Results  Component Value Date   WBC 8.0 07/22/2016   HGB 14.0 07/22/2016   HCT 41.7 07/22/2016   PLT 235 07/22/2016   GLUCOSE 132 07/22/2016   CHOL 224 (H) 12/18/2015   TRIG 200.0 (H) 12/18/2015   HDL 52.30 12/18/2015   LDLDIRECT 144.7 02/09/2012   LDLCALC 132 (H) 12/18/2015   ALT 12 07/22/2016   AST 15 07/22/2016   NA 142 07/22/2016   K 3.7 07/22/2016   CL 103 12/18/2015   CREATININE 1.0 07/22/2016   BUN 18.7 07/22/2016   CO2 27 07/22/2016   TSH 1.97 12/18/2015   INR 0.95 01/15/2012   HGBA1C 5.7 01/02/2011    No results found.  Assessment & Plan:   There are no diagnoses linked to this encounter. I have discontinued Ms. Matzen escitalopram. I am also having her maintain her ranitidine, Carboxymethylcellul-Glycerin, aspirin, vitamin B-12, ALIVE ONCE DAILY WOMENS 50+, potassium chloride, buPROPion, valsartan-hydrochlorothiazide, zolpidem, anastrozole, and amLODipine. We will continue to administer methylPREDNISolone acetate.  No orders of the defined types were placed in this encounter.    Follow-up: No Follow-up on file.  Walker Kehr, MD

## 2016-12-02 NOTE — Assessment & Plan Note (Signed)
Lexapro - d/c'd Wellbutrin

## 2016-12-11 ENCOUNTER — Other Ambulatory Visit: Payer: Self-pay | Admitting: Internal Medicine

## 2016-12-29 DIAGNOSIS — Z961 Presence of intraocular lens: Secondary | ICD-10-CM | POA: Diagnosis not present

## 2016-12-31 ENCOUNTER — Other Ambulatory Visit: Payer: Self-pay | Admitting: Internal Medicine

## 2017-01-14 ENCOUNTER — Encounter (HOSPITAL_COMMUNITY): Payer: Self-pay | Admitting: Nurse Practitioner

## 2017-01-14 ENCOUNTER — Emergency Department (HOSPITAL_COMMUNITY)
Admission: EM | Admit: 2017-01-14 | Discharge: 2017-01-14 | Disposition: A | Discharge status: Home / Self Care | Payer: Medicare Other | Attending: Emergency Medicine | Admitting: Emergency Medicine

## 2017-01-14 DIAGNOSIS — Z853 Personal history of malignant neoplasm of breast: Secondary | ICD-10-CM | POA: Diagnosis not present

## 2017-01-14 DIAGNOSIS — R040 Epistaxis: Secondary | ICD-10-CM | POA: Diagnosis not present

## 2017-01-14 DIAGNOSIS — I10 Essential (primary) hypertension: Secondary | ICD-10-CM | POA: Diagnosis not present

## 2017-01-14 DIAGNOSIS — Z79899 Other long term (current) drug therapy: Secondary | ICD-10-CM | POA: Insufficient documentation

## 2017-01-14 LAB — BASIC METABOLIC PANEL
Anion gap: 8 (ref 5–15)
BUN: 18 mg/dL (ref 6–20)
CALCIUM: 9.2 mg/dL (ref 8.9–10.3)
CO2: 27 mmol/L (ref 22–32)
Chloride: 106 mmol/L (ref 101–111)
Creatinine, Ser: 0.77 mg/dL (ref 0.44–1.00)
GFR calc non Af Amer: >60 mL/min (ref >60)
Glucose, Bld: 103 mg/dL — ABNORMAL HIGH (ref 65–99)
Potassium: 3.7 mmol/L (ref 3.5–5.1)
SODIUM: 141 mmol/L (ref 135–145)

## 2017-01-14 LAB — CBC
HEMATOCRIT: 40.3 % (ref 36.0–46.0)
HEMOGLOBIN: 13.3 g/dL (ref 12.0–15.0)
MCH: 28.1 pg (ref 26.0–34.0)
MCHC: 33 g/dL (ref 30.0–36.0)
MCV: 85.2 fL (ref 78.0–100.0)
PLATELETS: 205 10*3/uL (ref 150–400)
RBC: 4.73 MIL/uL (ref 3.87–5.11)
RDW: 13.4 % (ref 11.5–15.5)
WBC: 10.2 10*3/uL (ref 4.0–10.5)

## 2017-01-14 NOTE — ED Provider Notes (Signed)
[facility] DEPT Provider Note   CSN: [id number] Arrival date & time: 01/14/17  1125     History   Chief Complaint No chief complaint on file.   HPI Cynthia Garza is a 83 y.o. female.  The patient with acute onset of nosebleed about 10:30 this morning. Bleeding from the left nose. Did drain down the back of her throat. Patient without any prior history of nosebleeds. Patient without any syncope. No headache. Patient not on blood thinners. Bleeding stopped spontaneously around the 1:00.      Past Medical History:  Diagnosis Date  . Anxiety   . Breast cancer ([place]) 2015   ER+/PR+/Her2-  . Depression   . Dyslipidemia   . GERD (gastroesophageal reflux disease)   . Heart murmur   . Hemangioma of intra-abdominal structures   . HTN (hypertension)   . Insomnia   . LBP (low back pain)   . Osteoporosis   . Paresthesia   . Radiation 08/08/13-08/29/13   Bilat.breast 42.72 Gy  . Renal cyst   . Shingles     Patient Active Problem List   Diagnosis Date Noted  . Arthralgia 09/01/2016  . Well adult exam 12/18/2015  . Hypokalemia 06/17/2015  . Malignant neoplasm of upper-outer quadrant of right breast in female, estrogen receptor positive ([place]) 06/02/2013  . Right hip pain 10/22/2012  . Sciatica of right side 10/22/2012  . Memory problem 02/09/2012  . Rash 12/23/2011  . Pyelonephritis 12/04/2011  . Insomnia 11/04/2011  . TRIGGER FINGER 01/11/2010  . Fatigue due to treatment 01/23/2009  . VERTIGO 06/27/2008  . MYALGIA 11/03/2007  . HEMANGIOMA, HEPATIC 03/24/2007  . Dyslipidemia 03/24/2007  . Depression 03/24/2007  . Essential hypertension 03/24/2007  . GASTROESOPHAGEAL REFLUX DISEASE 03/24/2007  . RENAL CYST 03/24/2007  . Osteoporosis 03/24/2007  . SHINGLES, HX OF 03/24/2007  . Acquired absence of genital organ 03/24/2007    Past Surgical History:  Procedure Laterality Date  . ABDOMINAL HYSTERECTOMY     ?1970's  . BREAST LUMPECTOMY WITH NEEDLE LOCALIZATION  AND AXILLARY SENTINEL LYMPH NODE BX Bilateral 07/07/2013   Procedure: BREAST LUMPECTOMY WITH NEEDLE LOCALIZATION AND AXILLARY SENTINEL LYMPH NODE BIOPIES;  Surgeon: Merrie Roof, MD;  Location: [facility];  Service: General;  Laterality: Bilateral;    OB History    No data available       Home Medications    Prior to Admission medications   Medication Sig Start Date End Date Taking? Authorizing Provider  amLODipine (NORVASC) 5 MG tablet Take 1 tablet (5 mg total) by mouth daily. Patient taking differently: Take 5 mg by mouth at bedtime.  01/02/17  Yes Plotnikov, Evie Lacks, MD  anastrozole (ARIMIDEX) 1 MG tablet TAKE 1 TABLET (1 MG TOTAL) BY MOUTH DAILY. 08/01/16  Yes Magrinat, Virgie Dad, MD  aspirin EC 81 MG tablet Take 81 mg by mouth at bedtime.   Yes [provider]  buPROPion (WELLBUTRIN XL) 150 MG 24 hr tablet TAKE 1 TABLET BY MOUTH EVERY DAY 12/11/16  Yes Plotnikov, Evie Lacks, MD  Multiple Vitamins-Minerals (ALIVE ONCE DAILY WOMENS 50+) TABS Take 1 tablet by mouth daily.   Yes [provider]  Multiple Vitamins-Minerals (PRESERVISION AREDS 2 PO) Take 1 tablet by mouth 2 (two) times daily.   Yes [provider]  Propylene Glycol (SYSTANE BALANCE) 0.6 % SOLN Place 2 drops into both eyes at bedtime.   Yes [provider]  ranitidine (ZANTAC) 150 MG tablet Take 150 mg by mouth 2 (two)  times daily.   Yes [provider]  valsartan-hydrochlorothiazide (DIOVAN HCT) 320-25 MG tablet Take 1 tablet by mouth daily. Patient taking differently: Take 1 tablet by mouth daily with breakfast.  07/21/16  Yes Plotnikov, Evie Lacks, MD  vitamin B-12 (CYANOCOBALAMIN) 1000 MCG tablet Take 1,000 mcg by mouth daily.   Yes [provider]  zolpidem (AMBIEN) 10 MG tablet Take 1 tablet (10 mg total) by mouth at bedtime as needed for sleep. Patient taking differently: Take 5 mg by mouth at bedtime.  07/21/16  Yes Plotnikov, Evie Lacks, MD  potassium chloride (KLOR-CON)  8 MEQ tablet Take 1 tablet (8 mEq total) by mouth daily. Patient not taking: Reported on 01/14/2017 06/17/15   Plotnikov, Evie Lacks, MD    Family History Family History  Problem Relation Age of Onset  . Kidney failure Mother   . Hypertension Father   . Esophageal cancer Father   . Coronary artery disease Neg Hx     Social History Social History  Substance Use Topics  . Smoking status: Never Smoker  . Smokeless tobacco: Never Used  . Alcohol use No     Allergies   Cholestoff [plant sterols and stanols]; Lexapro [escitalopram oxalate]; Simvastatin; and Diflucan [fluconazole]   Review of Systems Review of Systems  Constitutional: Negative for fever.  HENT: Positive for nosebleeds. Negative for congestion.   Eyes: Negative for visual disturbance.  Respiratory: Negative for shortness of breath.   Cardiovascular: Negative for chest pain.  Gastrointestinal: Negative for abdominal pain.  Musculoskeletal: Negative for back pain and neck pain.  Neurological: Negative for syncope and headaches.  Hematological: Does not bruise/bleed easily.  Psychiatric/Behavioral: Negative for confusion.     Physical Exam Updated Vital Signs BP 137/75 (BP Location: Right Arm)   Pulse 78   Temp 98.5 F (36.9 C) (Oral)   Resp 18   Ht 1.524 m (5')   Wt 63 kg (139 lb)   SpO2 99%   BMI 27.15 kg/m   Physical Exam  Constitutional: She is oriented to person, place, and time. She appears well-developed and well-nourished. No distress.  HENT:  Head: Normocephalic and atraumatic.  Mouth/Throat: Oropharynx is clear and moist.  Left nares with some dried blood. No active bleeding. No bleeding source noted. Septum appeared normal. Right nares normal. No blood in the posterior pharynx.  Eyes: Pupils are equal, round, and reactive to light. Conjunctivae and EOM are normal.  Neck: Normal range of motion. Neck supple.  Cardiovascular: Normal rate, regular rhythm and normal heart sounds.     Pulmonary/Chest: Effort normal and breath sounds normal. No respiratory distress.  Abdominal: Soft. Bowel sounds are normal. There is no tenderness.  Musculoskeletal: Normal range of motion. She exhibits no edema.  Neurological: She is alert and oriented to person, place, and time. No cranial nerve deficit or sensory deficit. She exhibits normal muscle tone. Coordination normal.  Skin: Skin is warm.  Nursing note and vitals reviewed.    ED Treatments / Results  Labs (all labs ordered are listed, but only abnormal results are displayed) Labs Reviewed  BASIC METABOLIC PANEL - Abnormal; Notable for the following:       Result Value   Glucose, Bld 103 (*)    All other components within normal limits  CBC    EKG  EKG Interpretation None       Radiology No results found.  Procedures Procedures (including critical care time)  Medications Ordered in ED Medications - No data to display  Initial Impression / Assessment and Plan / ED Course  I have reviewed the triage vital signs and the nursing notes.  Pertinent labs & imaging results that were available during my care of the patient were reviewed by me and considered in my medical decision making (see chart for details).     Patient's nosebleed stopped prior to me seeing her. Sounds as if it was an anterior left nares nosebleed. Patient's lab work without any significant abnormalities. No significant anemia. Patient given nosebleed precautions and nose pinching technique.patient given referral to ear nose and throat for follow-up. Patient had some concerns about her blood pressure. Blood pressure readings here were reasonable. Slight elevation in the systolic blood pressure. Patient nontoxic no acute distress. No recurrent nosebleed noted in the emergency department.  Final Clinical Impressions(s) / ED Diagnoses   Final diagnoses:  Epistaxis    New Prescriptions Discharge Medication List as of 01/14/2017  3:15 PM        Fredia Sorrow, MD 01/14/17 [phone]

## 2017-01-14 NOTE — ED Triage Notes (Signed)
Patients nose has been bleeding since 1030 this morning with no relief.

## 2017-01-14 NOTE — Discharge Instructions (Signed)
He is pinching technique if the bleeding reoccurs as we discussed. He can point to follow-up with ear nose and throat doctor Janace Hoard information provided. Return if nose pinching techniques does not work.

## 2017-01-14 NOTE — ED Notes (Signed)
Left nostril bleed

## 2017-01-20 ENCOUNTER — Encounter: Payer: Self-pay | Admitting: Internal Medicine

## 2017-01-20 ENCOUNTER — Ambulatory Visit (INDEPENDENT_AMBULATORY_CARE_PROVIDER_SITE_OTHER): Payer: Medicare Other | Admitting: Internal Medicine

## 2017-01-20 DIAGNOSIS — F419 Anxiety disorder, unspecified: Secondary | ICD-10-CM | POA: Diagnosis not present

## 2017-01-20 DIAGNOSIS — R040 Epistaxis: Secondary | ICD-10-CM | POA: Diagnosis not present

## 2017-01-20 MED ORDER — LORAZEPAM 1 MG PO TABS: 0.5000 mg | ORAL_TABLET | Freq: Two times a day (BID) | ORAL | 0 refills | Status: DC | PRN

## 2017-01-20 MED ORDER — LORAZEPAM 1 MG PO TABS: 0.5000 mg | ORAL_TABLET | Freq: Two times a day (BID) | ORAL | 1 refills | Status: DC | PRN

## 2017-01-20 NOTE — Patient Instructions (Signed)
Nasal Normal Saline 4 times a day starting today Stop aspirin for now Nosebleed QR powder if needed Afrin nasal spray if needed

## 2017-01-20 NOTE — Assessment & Plan Note (Signed)
Nasal NS Nosebleed QR Afrin nasal spray

## 2017-01-20 NOTE — Assessment & Plan Note (Signed)
Lorazepam prn  Potential benefits of a long term benzodiazepines  use as well as potential risks  and complications were explained to the patient and were aknowledged.  

## 2017-01-20 NOTE — Progress Notes (Signed)
Subjective:  Patient ID: Cynthia Garza, female    DOB: [birth year]  Age: 83 y.o. MRN: [id number]  CC: No chief complaint on file.   HPI TAHNI PORCHIA presents for nose bleeds: ER visit on 8/22 and a little on Sat and Sunday. C/o fatigue.   Outpatient Medications Prior to Visit  Medication Sig Dispense Refill  . amLODipine (NORVASC) 5 MG tablet Take 1 tablet (5 mg total) by mouth daily. (Patient taking differently: Take 5 mg by mouth at bedtime. ) 90 tablet 1  . anastrozole (ARIMIDEX) 1 MG tablet TAKE 1 TABLET (1 MG TOTAL) BY MOUTH DAILY. 90 tablet 3  . aspirin EC 81 MG tablet Take 81 mg by mouth at bedtime.    Marland Kitchen buPROPion (WELLBUTRIN XL) 150 MG 24 hr tablet TAKE 1 TABLET BY MOUTH EVERY DAY 90 tablet 3  . Multiple Vitamins-Minerals (ALIVE ONCE DAILY WOMENS 50+) TABS Take 1 tablet by mouth daily.    . Multiple Vitamins-Minerals (PRESERVISION AREDS 2 PO) Take 1 tablet by mouth 2 (two) times daily.    . potassium chloride (KLOR-CON) 8 MEQ tablet Take 1 tablet (8 mEq total) by mouth daily. 30 tablet 11  . Propylene Glycol (SYSTANE BALANCE) 0.6 % SOLN Place 2 drops into both eyes at bedtime.    . ranitidine (ZANTAC) 150 MG tablet Take 150 mg by mouth 2 (two) times daily.    . valsartan-hydrochlorothiazide (DIOVAN HCT) 320-25 MG tablet Take 1 tablet by mouth daily. (Patient taking differently: Take 1 tablet by mouth daily with breakfast. ) 90 tablet 3  . vitamin B-12 (CYANOCOBALAMIN) 1000 MCG tablet Take 1,000 mcg by mouth daily.    Marland Kitchen zolpidem (AMBIEN) 10 MG tablet Take 1 tablet (10 mg total) by mouth at bedtime as needed for sleep. (Patient taking differently: Take 5 mg by mouth at bedtime. ) 90 tablet 1   Facility-Administered Medications Prior to Visit  Medication Dose Route Frequency Provider Last Rate Last Dose  . methylPREDNISolone acetate (DEPO-MEDROL) injection 80 mg  80 mg Intra-articular Once Shariah Assad, Evie Lacks, MD        ROS Review of Systems  Constitutional:  Negative.  Negative for activity change, appetite change, chills, diaphoresis, fatigue, fever and unexpected weight change.  HENT: Positive for nosebleeds. Negative for congestion, ear pain, facial swelling, hearing loss, mouth sores, postnasal drip, rhinorrhea, sinus pressure, sneezing, sore throat, tinnitus and trouble swallowing.   Eyes: Negative for pain, discharge, redness, itching and visual disturbance.  Respiratory: Negative for cough, chest tightness, shortness of breath, wheezing and stridor.   Cardiovascular: Negative for chest pain, palpitations and leg swelling.  Gastrointestinal: Negative for abdominal distention, anal bleeding, blood in stool, constipation, diarrhea, nausea and rectal pain.  Genitourinary: Negative for difficulty urinating, dysuria, flank pain, frequency, genital sores, hematuria, pelvic pain, urgency, vaginal bleeding and vaginal discharge.  Musculoskeletal: Negative for arthralgias, back pain, gait problem, joint swelling, neck pain and neck stiffness.  Skin: Negative.  Negative for rash.  Neurological: Negative for dizziness, tremors, seizures, syncope, speech difficulty, weakness, numbness and headaches.  Hematological: Negative for adenopathy. Does not bruise/bleed easily.  Psychiatric/Behavioral: Negative for behavioral problems, decreased concentration, dysphoric mood, sleep disturbance and suicidal ideas. The patient is not nervous/anxious.     Objective:  BP 128/72 (BP Location: Left Arm, Patient Position: Sitting, Cuff Size: Normal)   Pulse 70   Temp 97.9 F (36.6 C) (Oral)   Ht 5' (1.524 m)   Wt 137 lb (62.1 kg)   SpO2 98%  BMI 26.76 kg/m   BP Readings from Last 3 Encounters:  01/20/17 128/72  01/14/17 137/75  12/02/16 130/64    Wt Readings from Last 3 Encounters:  01/20/17 137 lb (62.1 kg)  01/14/17 139 lb (63 kg)  12/02/16 139 lb (63 kg)    Physical Exam  Constitutional: She appears well-developed. No distress.  HENT:  Head:  Normocephalic.  Right Ear: External ear normal.  Left Ear: External ear normal.  Nose: Nose normal.  Mouth/Throat: Oropharynx is clear and moist.  Eyes: Pupils are equal, round, and reactive to light. Conjunctivae are normal. Right eye exhibits no discharge. Left eye exhibits no discharge.  Neck: Normal range of motion. Neck supple. No JVD present. No tracheal deviation present. No thyromegaly present.  Cardiovascular: Normal rate, regular rhythm and normal heart sounds.   Pulmonary/Chest: No stridor. No respiratory distress. She has no wheezes.  Abdominal: Soft. Bowel sounds are normal. She exhibits no distension and no mass. There is no tenderness. There is no rebound and no guarding.  Musculoskeletal: She exhibits no edema or tenderness.  Lymphadenopathy:    She has no cervical adenopathy.  Neurological: She displays normal reflexes. No cranial nerve deficit. She exhibits normal muscle tone. Coordination normal.  Skin: No rash noted. No erythema.  Psychiatric: She has a normal mood and affect. Her behavior is normal. Judgment and thought content normal.  a speck of dry blood in the L nostril; the pt would resist exam  Lab Results  Component Value Date   WBC 10.2 01/14/2017   HGB 13.3 01/14/2017   HCT 40.3 01/14/2017   PLT 205 01/14/2017   GLUCOSE 103 (H) 01/14/2017   CHOL 224 (H) 12/18/2015   TRIG 200.0 (H) 12/18/2015   HDL 52.30 12/18/2015   LDLDIRECT 144.7 02/09/2012   LDLCALC 132 (H) 12/18/2015   ALT 12 07/22/2016   AST 15 07/22/2016   NA 141 01/14/2017   K 3.7 01/14/2017   CL 106 01/14/2017   CREATININE 0.77 01/14/2017   BUN 18 01/14/2017   CO2 27 01/14/2017   TSH 1.97 12/18/2015   INR 0.95 01/15/2012   HGBA1C 5.7 01/02/2011    No results found.  Assessment & Plan:   There are no diagnoses linked to this encounter. I am having Ms. Ivancic maintain her ranitidine, vitamin B-12, ALIVE ONCE DAILY WOMENS 50+, potassium chloride, valsartan-hydrochlorothiazide,  zolpidem, anastrozole, buPROPion, amLODipine, Multiple Vitamins-Minerals (PRESERVISION AREDS 2 PO), aspirin EC, and Propylene Glycol. We will continue to administer methylPREDNISolone acetate.  No orders of the defined types were placed in this encounter.    Follow-up: No Follow-up on file.  Walker Kehr, MD

## 2017-01-21 DIAGNOSIS — J3489 Other specified disorders of nose and nasal sinuses: Secondary | ICD-10-CM | POA: Diagnosis not present

## 2017-01-21 DIAGNOSIS — R040 Epistaxis: Secondary | ICD-10-CM | POA: Diagnosis not present

## 2017-02-27 ENCOUNTER — Other Ambulatory Visit: Payer: Self-pay | Admitting: Internal Medicine

## 2017-03-03 ENCOUNTER — Telehealth: Payer: Self-pay | Admitting: Internal Medicine

## 2017-03-03 NOTE — Telephone Encounter (Signed)
Pt called for a refill of her  meclizine (ANTIVERT) 12.5 MG tablet  She states she has been having symptoms of vertigo, she has been dizzy when she stands up. Please advise and call back  Pt was advised she may need an appointment  POF

## 2017-03-03 NOTE — Telephone Encounter (Signed)
Meclizine is no longer on med list. Pls advise...Cynthia Garza

## 2017-03-03 NOTE — Telephone Encounter (Signed)
OK to fill this/these prescription(s) with additional refills x2 Thank you!  

## 2017-03-04 MED ORDER — MECLIZINE HCL 12.5 MG PO TABS: 12.5000 mg | ORAL_TABLET | Freq: Two times a day (BID) | ORAL | 0 refills | Status: DC | PRN

## 2017-03-04 MED ORDER — MECLIZINE HCL 12.5 MG PO TABS: 12.5000 mg | ORAL_TABLET | Freq: Two times a day (BID) | ORAL | 2 refills | Status: DC | PRN

## 2017-03-04 NOTE — Telephone Encounter (Signed)
Notified pt MD ok rx for meclizine need to verify pharmacy. Sent rx to [facility].../lmb

## 2017-03-13 ENCOUNTER — Ambulatory Visit (INDEPENDENT_AMBULATORY_CARE_PROVIDER_SITE_OTHER): Payer: Medicare Other | Admitting: Family Medicine

## 2017-03-13 ENCOUNTER — Encounter: Payer: Self-pay | Admitting: Family Medicine

## 2017-03-13 VITALS — BP 124/70 | HR 71 | Temp 98.2°F | Ht 60.0 in | Wt 139.0 lb

## 2017-03-13 DIAGNOSIS — Z23 Encounter for immunization: Secondary | ICD-10-CM | POA: Diagnosis not present

## 2017-03-13 DIAGNOSIS — R42 Dizziness and giddiness: Secondary | ICD-10-CM | POA: Diagnosis not present

## 2017-03-13 NOTE — Progress Notes (Signed)
Cynthia Garza - 83 y.o. female MRN [id number]  Date of birth: April 16, 1933  SUBJECTIVE:  Including CC & ROS.  Chief Complaint  Patient presents with  . Dizziness    has had it for 3 weeks, meclizine has not been helping, she states it seems to be making it worse so she quit taking it    Ms. GilbreathIs an 83 year old female that is presenting with vertigo type symptoms. She has a history of vertigo several years ago. Her symptoms started about 3 weeks ago with no inciting event. They're intermittent in nature. They're worse if she turns her head a certain direction or rolls over in bed. She feels as if the room is spinning and has had some nausea associated with it. Denies any palpitations or excess sweating with the dizziness. Has been taking meclizine with limited to no improvement. Has tried some Dramamine. Denies any tinnitus or changes in her hearing. Has not had any changes in her vision.   Was seen in 2014 and diagnosed with BPPV. Was started on meclizine and had resolution.  Review of Systems  Constitutional: Negative for fever.  Musculoskeletal: Negative for gait problem.  Skin: Negative for color change.  Neurological: Positive for dizziness. Negative for weakness and numbness.    HISTORY: Past Medical, Surgical, Social, and Family History Reviewed & Updated per EMR.   Pertinent Historical Findings include:  Past Medical History:  Diagnosis Date  . Anxiety   . Breast cancer ([place]) 2015   ER+/PR+/Her2-  . Depression   . Dyslipidemia   . GERD (gastroesophageal reflux disease)   . Heart murmur   . Hemangioma of intra-abdominal structures   . HTN (hypertension)   . Insomnia   . LBP (low back pain)   . Osteoporosis   . Paresthesia   . Radiation 08/08/13-08/29/13   Bilat.breast 42.72 Gy  . Renal cyst   . Shingles     Past Surgical History:  Procedure Laterality Date  . ABDOMINAL HYSTERECTOMY     ?1970's  . BREAST LUMPECTOMY WITH NEEDLE LOCALIZATION AND AXILLARY  SENTINEL LYMPH NODE BX Bilateral 07/07/2013   Procedure: BREAST LUMPECTOMY WITH NEEDLE LOCALIZATION AND AXILLARY SENTINEL LYMPH NODE BIOPIES;  Surgeon: Merrie Roof, MD;  Location: [facility];  Service: General;  Laterality: Bilateral;    Allergies  Allergen Reactions  . Cholestoff [Plant Sterols And Stanols]     unknown  . Lexapro [Escitalopram Oxalate]     fatigue  . Simvastatin Other (See Comments)    aches  . Diflucan [Fluconazole] Rash    Family History  Problem Relation Age of Onset  . Kidney failure Mother   . Hypertension Father   . Esophageal cancer Father   . Coronary artery disease Neg Hx      Social History   Social History  . Marital status: Married    Spouse name: N/A  . Number of children: 2  . Years of education: N/A   Occupational History  . Retired Retired    [employer]   Social History Main Topics  . Smoking status: Never Smoker  . Smokeless tobacco: Never Used  . Alcohol use No  . Drug use: No  . Sexual activity: Yes     Comment: menarche age 78, first live birth 44.5 yrs of age, P2, menopause 79, no HRT   Other Topics Concern  . Not on file   Social History Narrative  . No narrative on file     PHYSICAL EXAM:  VS:  BP 124/70 (BP Location: Left Arm, Patient Position: Sitting, Cuff Size: Normal)   Pulse 71   Temp 98.2 F (36.8 C) (Oral)   Ht 5' (1.524 m)   Wt 139 lb (63 kg)   SpO2 98%   BMI 27.15 kg/m  Physical Exam Gen: NAD, alert, cooperative with exam,  ENT: normal lips, normal nasal mucosa,  Eye: normal EOM, normal conjunctiva and lids CV:  no edema, +2 pedal pulses   Resp: no accessory muscle use, non-labored,  Skin: no rashes, no areas of induration  Neuro: normal tone, normal sensation to touch, cranial nerves II through XII intact, Psych:  normal insight, alert and oriented MSK: Normal strength, normal gait      ASSESSMENT & PLAN:   Vertigo Has not had any improvement with meclizine has a history of BPPV. -  Encouraged and counseled on Epley maneuver today. - Referral to physical therapy for vestibular ocular rehabilitation.

## 2017-03-13 NOTE — Patient Instructions (Addendum)
Thank you for coming in,   Please try the exercises to help with your dizziness.   Please feel free to call with any questions or concerns at any time, at [phone]. --Dr. Raeford Razor  How to Perform the Epley Maneuver The Epley maneuver is an exercise that relieves symptoms of vertigo. Vertigo is the feeling that you or your surroundings are moving when they are not. When you feel vertigo, you may feel like the room is spinning and have trouble walking. Dizziness is a little different than vertigo. When you are dizzy, you may feel unsteady or light-headed. You can do this maneuver at home whenever you have symptoms of vertigo. You can do it up to 3 times a day until your symptoms go away. Even though the Epley maneuver may relieve your vertigo for a few weeks, it is possible that your symptoms will return. This maneuver relieves vertigo, but it does not relieve dizziness. What are the risks? If it is done correctly, the Epley maneuver is considered safe. Sometimes it can lead to dizziness or nausea that goes away after a short time. If you develop other symptoms, such as changes in vision, weakness, or numbness, stop doing the maneuver and call your health care provider. How to perform the Epley maneuver 1. Sit on the edge of a bed or table with your back straight and your legs extended or hanging over the edge of the bed or table. 2. Turn your head halfway toward the affected ear or side. 3. Lie backward quickly with your head turned until you are lying flat on your back. You may want to position a pillow under your shoulders. 4. Hold this position for 30 seconds. You may experience an attack of vertigo. This is normal. 5. Turn your head to the opposite direction until your unaffected ear is facing the floor. 6. Hold this position for 30 seconds. You may experience an attack of vertigo. This is normal. Hold this position until the vertigo stops. 7. Turn your whole body to the same side as your  head. Hold for another 30 seconds. 8. Sit back up. You can repeat this exercise up to 3 times a day. Follow these instructions at home:  After doing the Epley maneuver, you can return to your normal activities.  Ask your health care provider if there is anything you should do at home to prevent vertigo. He or she may recommend that you: ? Keep your head raised (elevated) with two or more pillows while you sleep. ? Do not sleep on the side of your affected ear. ? Get up slowly from bed. ? Avoid sudden movements during the day. ? Avoid extreme head movement, like looking up or bending over. Contact a health care provider if:  Your vertigo gets worse.  You have other symptoms, including: ? Nausea. ? Vomiting. ? Headache. Get help right away if:  You have vision changes.  You have a severe or worsening headache or neck pain.  You cannot stop vomiting.  You have new numbness or weakness in any part of your body. Summary  Vertigo is the feeling that you or your surroundings are moving when they are not.  The Epley maneuver is an exercise that relieves symptoms of vertigo.  If the Epley maneuver is done correctly, it is considered safe. You can do it up to 3 times a day. This information is not intended to replace advice given to you by your health care provider. Make sure you discuss any  questions you have with your health care provider. Document Released: 05/17/2013 Document Revised: 04/01/2016 Document Reviewed: 04/01/2016 Elsevier Interactive Patient Education  2017 [employer].

## 2017-03-13 NOTE — Assessment & Plan Note (Signed)
Has not had any improvement with meclizine has a history of BPPV. - Encouraged and counseled on Epley maneuver today. - Referral to physical therapy for vestibular ocular rehabilitation.

## 2017-04-06 ENCOUNTER — Ambulatory Visit: Payer: Medicare Other | Admitting: Internal Medicine

## 2017-04-07 ENCOUNTER — Encounter: Payer: Self-pay | Admitting: General Surgery

## 2017-04-07 DIAGNOSIS — R921 Mammographic calcification found on diagnostic imaging of breast: Secondary | ICD-10-CM | POA: Diagnosis not present

## 2017-04-07 DIAGNOSIS — Z853 Personal history of malignant neoplasm of breast: Secondary | ICD-10-CM | POA: Diagnosis not present

## 2017-04-14 ENCOUNTER — Encounter: Payer: Self-pay | Admitting: Internal Medicine

## 2017-04-14 ENCOUNTER — Ambulatory Visit (INDEPENDENT_AMBULATORY_CARE_PROVIDER_SITE_OTHER): Payer: Medicare Other | Admitting: Internal Medicine

## 2017-04-14 DIAGNOSIS — F5101 Primary insomnia: Secondary | ICD-10-CM

## 2017-04-14 DIAGNOSIS — I10 Essential (primary) hypertension: Secondary | ICD-10-CM

## 2017-04-14 DIAGNOSIS — R42 Dizziness and giddiness: Secondary | ICD-10-CM | POA: Diagnosis not present

## 2017-04-14 DIAGNOSIS — F419 Anxiety disorder, unspecified: Secondary | ICD-10-CM

## 2017-04-14 NOTE — Assessment & Plan Note (Signed)
Amlodipine Diovan HCT 2018

## 2017-04-14 NOTE — Assessment & Plan Note (Signed)
Benign Positional Vertigo symptoms   Meclizine prn Start Cynthia Garza - Daroff exercise several times a day as dirrected.

## 2017-04-14 NOTE — Assessment & Plan Note (Signed)
Lorazepam prn  Potential benefits of a long term benzodiazepines  use as well as potential risks  and complications were explained to the patient and were aknowledged.  

## 2017-04-14 NOTE — Assessment & Plan Note (Signed)
Zolpidem prn  Potential benefits of a long term benzodiazepines  use as well as potential risks  and complications were explained to the patient and were aknowledged. 

## 2017-04-14 NOTE — Progress Notes (Signed)
Subjective:  Patient ID: Cynthia Garza, female    DOB: 05/03/1933  Age: 83 y.o. MRN: [id number]  CC: No chief complaint on file.   HPI KEARAH GAYDEN presents for HTN, breast ca, B12. C/o vertigo at times - recurrent  Outpatient Medications Prior to Visit  Medication Sig Dispense Refill  . amLODipine (NORVASC) 5 MG tablet Take 1 tablet (5 mg total) by mouth daily. (Patient taking differently: Take 5 mg by mouth at bedtime. ) 90 tablet 1  . anastrozole (ARIMIDEX) 1 MG tablet TAKE 1 TABLET (1 MG TOTAL) BY MOUTH DAILY. 90 tablet 3  . aspirin EC 81 MG tablet Take 81 mg by mouth at bedtime.    Marland Kitchen buPROPion (WELLBUTRIN XL) 150 MG 24 hr tablet TAKE 1 TABLET BY MOUTH EVERY DAY 90 tablet 3  . meclizine (ANTIVERT) 12.5 MG tablet Take 1 tablet (12.5 mg total) by mouth 2 (two) times daily as needed for dizziness. 60 tablet 2  . Multiple Vitamins-Minerals (ALIVE ONCE DAILY WOMENS 50+) TABS Take 1 tablet by mouth daily.    . Multiple Vitamins-Minerals (PRESERVISION AREDS 2 PO) Take 1 tablet by mouth 2 (two) times daily.    Marland Kitchen Propylene Glycol (SYSTANE BALANCE) 0.6 % SOLN Place 2 drops into both eyes at bedtime.    . ranitidine (ZANTAC) 150 MG tablet Take 150 mg by mouth 2 (two) times daily.    . valsartan-hydrochlorothiazide (DIOVAN HCT) 320-25 MG tablet Take 1 tablet by mouth daily. (Patient taking differently: Take 1 tablet by mouth daily with breakfast. ) 90 tablet 3  . vitamin B-12 (CYANOCOBALAMIN) 1000 MCG tablet Take 1,000 mcg by mouth daily.    Marland Kitchen zolpidem (AMBIEN) 10 MG tablet Take 1 tablet (10 mg total) by mouth at bedtime as needed for sleep. (Patient taking differently: Take 5 mg by mouth at bedtime. ) 90 tablet 1  . potassium chloride (KLOR-CON) 8 MEQ tablet Take 1 tablet (8 mEq total) by mouth daily. 30 tablet 11   Facility-Administered Medications Prior to Visit  Medication Dose Route Frequency Provider Last Rate Last Dose  . methylPREDNISolone acetate (DEPO-MEDROL) injection 80  mg  80 mg Intra-articular Once Plotnikov, Evie Lacks, MD        ROS Review of Systems  Constitutional: Positive for fatigue. Negative for activity change, appetite change, chills and unexpected weight change.  HENT: Negative for congestion, mouth sores and sinus pressure.   Eyes: Negative for visual disturbance.  Respiratory: Negative for cough and chest tightness.   Gastrointestinal: Negative for abdominal pain and nausea.  Genitourinary: Negative for difficulty urinating, frequency and vaginal pain.  Musculoskeletal: Positive for arthralgias. Negative for gait problem.  Skin: Negative for pallor and rash.  Neurological: Negative for dizziness, tremors, weakness, numbness and headaches.  Psychiatric/Behavioral: Negative for confusion and sleep disturbance. The patient is nervous/anxious.     Objective:  BP 136/78 (BP Location: Left Arm, Patient Position: Sitting, Cuff Size: Normal)   Pulse 79   Temp 98.3 F (36.8 C) (Oral)   Ht 5' (1.524 m)   Wt 137 lb (62.1 kg)   SpO2 98%   BMI 26.76 kg/m   BP Readings from Last 3 Encounters:  04/14/17 136/78  03/13/17 124/70  01/20/17 128/72    Wt Readings from Last 3 Encounters:  04/14/17 137 lb (62.1 kg)  03/13/17 139 lb (63 kg)  01/20/17 137 lb (62.1 kg)    Physical Exam  Constitutional: She appears well-developed. No distress.  HENT:  Head: Normocephalic.  Right  Ear: External ear normal.  Left Ear: External ear normal.  Nose: Nose normal.  Mouth/Throat: Oropharynx is clear and moist.  Eyes: Conjunctivae are normal. Pupils are equal, round, and reactive to light. Right eye exhibits no discharge. Left eye exhibits no discharge.  Neck: Normal range of motion. Neck supple. No JVD present. No tracheal deviation present. No thyromegaly present.  Cardiovascular: Normal rate, regular rhythm and normal heart sounds.  Pulmonary/Chest: No stridor. No respiratory distress. She has no wheezes.  Abdominal: Soft. Bowel sounds are normal.  She exhibits no distension and no mass. There is no tenderness. There is no rebound and no guarding.  Musculoskeletal: She exhibits no edema or tenderness.  Lymphadenopathy:    She has no cervical adenopathy.  Neurological: She displays normal reflexes. No cranial nerve deficit. She exhibits normal muscle tone. Coordination normal.  Skin: No rash noted. No erythema.  Psychiatric: She has a normal mood and affect. Her behavior is normal. Judgment and thought content normal.    Lab Results  Component Value Date   WBC 10.2 01/14/2017   HGB 13.3 01/14/2017   HCT 40.3 01/14/2017   PLT 205 01/14/2017   GLUCOSE 103 (H) 01/14/2017   CHOL 224 (H) 12/18/2015   TRIG 200.0 (H) 12/18/2015   HDL 52.30 12/18/2015   LDLDIRECT 144.7 02/09/2012   LDLCALC 132 (H) 12/18/2015   ALT 12 07/22/2016   AST 15 07/22/2016   NA 141 01/14/2017   K 3.7 01/14/2017   CL 106 01/14/2017   CREATININE 0.77 01/14/2017   BUN 18 01/14/2017   CO2 27 01/14/2017   TSH 1.97 12/18/2015   INR 0.95 01/15/2012   HGBA1C 5.7 01/02/2011    No results found.  Assessment & Plan:   There are no diagnoses linked to this encounter. I have discontinued Bonnita Levan. Peppard's potassium chloride. I am also having her maintain her ranitidine, vitamin B-12, ALIVE ONCE DAILY WOMENS 50+, valsartan-hydrochlorothiazide, zolpidem, anastrozole, buPROPion, amLODipine, Multiple Vitamins-Minerals (PRESERVISION AREDS 2 PO), aspirin EC, Propylene Glycol, and meclizine. We will continue to administer methylPREDNISolone acetate.  No orders of the defined types were placed in this encounter.    Follow-up: No Follow-up on file.  Walker Kehr, MD

## 2017-04-14 NOTE — Patient Instructions (Signed)
MC well w/Jill 

## 2017-07-03 ENCOUNTER — Ambulatory Visit (INDEPENDENT_AMBULATORY_CARE_PROVIDER_SITE_OTHER): Payer: Medicare Other

## 2017-07-03 ENCOUNTER — Encounter: Payer: Self-pay | Admitting: Nurse Practitioner

## 2017-07-03 ENCOUNTER — Telehealth: Payer: Self-pay | Admitting: Internal Medicine

## 2017-07-03 ENCOUNTER — Ambulatory Visit (INDEPENDENT_AMBULATORY_CARE_PROVIDER_SITE_OTHER): Payer: Medicare Other | Admitting: Nurse Practitioner

## 2017-07-03 ENCOUNTER — Ambulatory Visit: Payer: Self-pay | Admitting: *Deleted

## 2017-07-03 VITALS — BP 132/76 | HR 82 | Temp 97.8°F | Ht 60.0 in | Wt 136.0 lb

## 2017-07-03 DIAGNOSIS — J101 Influenza due to other identified influenza virus with other respiratory manifestations: Secondary | ICD-10-CM

## 2017-07-03 DIAGNOSIS — J189 Pneumonia, unspecified organism: Secondary | ICD-10-CM

## 2017-07-03 DIAGNOSIS — R0989 Other specified symptoms and signs involving the circulatory and respiratory systems: Secondary | ICD-10-CM | POA: Diagnosis not present

## 2017-07-03 DIAGNOSIS — J181 Lobar pneumonia, unspecified organism: Secondary | ICD-10-CM | POA: Diagnosis not present

## 2017-07-03 DIAGNOSIS — R05 Cough: Secondary | ICD-10-CM | POA: Diagnosis not present

## 2017-07-03 LAB — POC INFLUENZA A&B (BINAX/QUICKVUE)
INFLUENZA A, POC: POSITIVE — AB
Influenza B, POC: NEGATIVE

## 2017-07-03 MED ORDER — LEVOFLOXACIN 500 MG PO TABS: 500.0000 mg | ORAL_TABLET | Freq: Every day | ORAL | 0 refills | Status: DC

## 2017-07-03 MED ORDER — DM-GUAIFENESIN ER 30-600 MG PO TB12: 1.0000 | ORAL_TABLET | Freq: Two times a day (BID) | ORAL | 0 refills | Status: DC | PRN

## 2017-07-03 MED ORDER — HYDROCODONE-HOMATROPINE 5-1.5 MG/5ML PO SYRP: 5.0000 mL | ORAL_SOLUTION | Freq: Two times a day (BID) | ORAL | 0 refills | Status: DC | PRN

## 2017-07-03 MED ORDER — ALBUTEROL SULFATE HFA 108 (90 BASE) MCG/ACT IN AERS: 1.0000 | INHALATION_SPRAY | Freq: Four times a day (QID) | RESPIRATORY_TRACT | 0 refills | Status: DC | PRN

## 2017-07-03 MED ORDER — IPRATROPIUM BROMIDE 0.03 % NA SOLN
2.0000 | Freq: Two times a day (BID) | NASAL | 0 refills | Status: DC
Start: 2017-07-03 — End: 2017-07-30

## 2017-07-03 MED ORDER — OSELTAMIVIR PHOSPHATE 75 MG PO CAPS: 75.0000 mg | ORAL_CAPSULE | Freq: Two times a day (BID) | ORAL | 0 refills | Status: DC

## 2017-07-03 NOTE — Patient Instructions (Addendum)
CXR positive for pneumonia. levaquin also sent. Make F/up appt in 1-2weeks with me or PCP.  Do not take hycodan and ambien within 6hrs of each other.  Encourage adequate oral hydration and rest.  Return to office if no improvement in 1week.  Influenza, Adult Influenza, more commonly known as "the flu," is a viral infection that primarily affects the respiratory tract. The respiratory tract includes organs that help you breathe, such as the lungs, nose, and throat. The flu causes many common cold symptoms, as well as a high fever and body aches. The flu spreads easily from person to person (is contagious). Getting a flu shot (influenza vaccination) every year is the best way to prevent influenza. What are the causes? Influenza is caused by a virus. You can catch the virus by:  Breathing in droplets from an infected person's cough or sneeze.  Touching something that was recently contaminated with the virus and then touching your mouth, nose, or eyes.  What increases the risk? The following factors may make you more likely to get the flu:  Not cleaning your hands frequently with soap and water or alcohol-based hand sanitizer.  Having close contact with many people during cold and flu season.  Touching your mouth, eyes, or nose without washing or sanitizing your hands first.  Not drinking enough fluids or not eating a healthy diet.  Not getting enough sleep or exercise.  Being under a high amount of stress.  Not getting a yearly (annual) flu shot.  You may be at a higher risk of complications from the flu, such as a severe lung infection (pneumonia), if you:  Are over the age of 45.  Are pregnant.  Have a weakened disease-fighting system (immune system). You may have a weakened immune system if you: ? Have HIV or AIDS. ? Are undergoing chemotherapy. ? Aretaking medicines that reduce the activity of (suppress) the immune system.  Have a long-term (chronic) illness, such as  heart disease, kidney disease, diabetes, or lung disease.  Have a liver disorder.  Are obese.  Have anemia.  What are the signs or symptoms? Symptoms of this condition typically last 4-10 days and may include:  Fever.  Chills.  Headache, body aches, or muscle aches.  Sore throat.  Cough.  Runny or congested nose.  Chest discomfort and cough.  Poor appetite.  Weakness or tiredness (fatigue).  Dizziness.  Nausea or vomiting.  How is this diagnosed? This condition may be diagnosed based on your medical history and a physical exam. Your health care provider may do a nose or throat swab test to confirm the diagnosis. How is this treated? If influenza is detected early, you can be treated with antiviral medicine that can reduce the length of your illness and the severity of your symptoms. This medicine may be given by mouth (orally) or through an IV tube that is inserted in one of your veins. The goal of treatment is to relieve symptoms by taking care of yourself at home. This may include taking over-the-counter medicines, drinking plenty of fluids, and adding humidity to the air in your home. In some cases, influenza goes away on its own. Severe influenza or complications from influenza may be treated in a hospital. Follow these instructions at home:  Take over-the-counter and prescription medicines only as told by your health care provider.  Use a cool mist humidifier to add humidity to the air in your home. This can make breathing easier.  Rest as needed.  Drink enough  fluid to keep your urine clear or pale yellow.  Cover your mouth and nose when you cough or sneeze.  Wash your hands with soap and water often, especially after you cough or sneeze. If soap and water are not available, use hand sanitizer.  Stay home from work or school as told by your health care provider. Unless you are visiting your health care provider, try to avoid leaving home until your fever has  been gone for 24 hours without the use of medicine.  Keep all follow-up visits as told by your health care provider. This is important. How is this prevented?  Getting an annual flu shot is the best way to avoid getting the flu. You may get the flu shot in late summer, fall, or winter. Ask your health care provider when you should get your flu shot.  Wash your hands often or use hand sanitizer often.  Avoid contact with people who are sick during cold and flu season.  Eat a healthy diet, drink plenty of fluids, get enough sleep, and exercise regularly. Contact a health care provider if:  You develop new symptoms.  You have: ? Chest pain. ? Diarrhea. ? A fever.  Your cough gets worse.  You produce more mucus.  You feel nauseous or you vomit. Get help right away if:  You develop shortness of breath or difficulty breathing.  Your skin or nails turn a bluish color.  You have severe pain or stiffness in your neck.  You develop a sudden headache or sudden pain in your face or ear.  You cannot stop vomiting. This information is not intended to replace advice given to you by your health care provider. Make sure you discuss any questions you have with your health care provider. Document Released: 05/09/2000 Document Revised: 10/18/2015 Document Reviewed: 03/06/2015 Elsevier Interactive Patient Education  2017 [employer].

## 2017-07-03 NOTE — Telephone Encounter (Signed)
Pt would like to be call on her cell once the results are in.  Her number is [phone].

## 2017-07-03 NOTE — Progress Notes (Signed)
Subjective:  Patient ID: Cynthia Garza, female    DOB: [birth year]  Age: 84 y.o. MRN: [id number]  CC: Cough (congestion,bodyache,sore,cant coughing--body hurt too bad/2 days)   URI   This is a new problem. The current episode started yesterday. The problem has been gradually worsening. The maximum temperature recorded prior to her arrival was 100.4 - 100.9 F. Associated symptoms include chest pain, congestion, coughing, ear pain, headaches, joint pain, a plugged ear sensation, rhinorrhea, sinus pain, sneezing, a sore throat and swollen glands. Pertinent negatives include no diarrhea, nausea, vomiting or wheezing. She has tried acetaminophen (corichidine) for the symptoms. The treatment provided no relief.   Outpatient Medications Prior to Visit  Medication Sig Dispense Refill  . amLODipine (NORVASC) 5 MG tablet Take 1 tablet (5 mg total) by mouth daily. (Patient taking differently: Take 5 mg by mouth at bedtime. ) 90 tablet 1  . anastrozole (ARIMIDEX) 1 MG tablet TAKE 1 TABLET (1 MG TOTAL) BY MOUTH DAILY. 90 tablet 3  . aspirin EC 81 MG tablet Take 81 mg by mouth at bedtime.    Marland Kitchen buPROPion (WELLBUTRIN XL) 150 MG 24 hr tablet TAKE 1 TABLET BY MOUTH EVERY DAY 90 tablet 3  . meclizine (ANTIVERT) 12.5 MG tablet Take 1 tablet (12.5 mg total) by mouth 2 (two) times daily as needed for dizziness. 60 tablet 2  . Multiple Vitamins-Minerals (ALIVE ONCE DAILY WOMENS 50+) TABS Take 1 tablet by mouth daily.    . Multiple Vitamins-Minerals (PRESERVISION AREDS 2 PO) Take 1 tablet by mouth 2 (two) times daily.    Marland Kitchen Propylene Glycol (SYSTANE BALANCE) 0.6 % SOLN Place 2 drops into both eyes at bedtime.    . ranitidine (ZANTAC) 150 MG tablet Take 150 mg by mouth 2 (two) times daily.    . valsartan-hydrochlorothiazide (DIOVAN HCT) 320-25 MG tablet Take 1 tablet by mouth daily. (Patient taking differently: Take 1 tablet by mouth daily with breakfast. ) 90 tablet 3  . vitamin B-12 (CYANOCOBALAMIN) 1000 MCG  tablet Take 1,000 mcg by mouth daily.    Marland Kitchen zolpidem (AMBIEN) 10 MG tablet Take 1 tablet (10 mg total) by mouth at bedtime as needed for sleep. (Patient taking differently: Take 5 mg by mouth at bedtime. ) 90 tablet 1   Facility-Administered Medications Prior to Visit  Medication Dose Route Frequency Provider Last Rate Last Dose  . methylPREDNISolone acetate (DEPO-MEDROL) injection 80 mg  80 mg Intra-articular Once Plotnikov, Evie Lacks, MD        ROS See HPI  Objective:  BP 132/76   Pulse 82   Temp 97.8 F (36.6 C)   Ht 5' (1.524 m)   Wt 136 lb (61.7 kg)   SpO2 97%   BMI 26.56 kg/m   BP Readings from Last 3 Encounters:  07/03/17 132/76  04/14/17 136/78  03/13/17 124/70    Wt Readings from Last 3 Encounters:  07/03/17 136 lb (61.7 kg)  04/14/17 137 lb (62.1 kg)  03/13/17 139 lb (63 kg)    Physical Exam  Constitutional: She is oriented to person, place, and time. No distress.  HENT:  Right Ear: Tympanic membrane, external ear and ear canal normal.  Left Ear: Tympanic membrane, external ear and ear canal normal.  Nose: Mucosal edema and rhinorrhea present. Right sinus exhibits maxillary sinus tenderness. Right sinus exhibits no frontal sinus tenderness. Left sinus exhibits maxillary sinus tenderness. Left sinus exhibits no frontal sinus tenderness.  Mouth/Throat: Uvula is midline. No trismus in the jaw. Posterior  oropharyngeal erythema present. No oropharyngeal exudate.  Eyes: No scleral icterus.  Neck: Normal range of motion. Neck supple.  Cardiovascular: Normal rate, regular rhythm and normal heart sounds.  Pulmonary/Chest: Effort normal. No respiratory distress. She has no wheezes. She has rales.  Rales in LLL  Musculoskeletal: She exhibits no edema.  Lymphadenopathy:    She has cervical adenopathy.  Neurological: She is alert and oriented to person, place, and time.  Skin: Skin is warm and dry. No rash noted.  Psychiatric: She has a normal mood and affect. Her  behavior is normal.  Vitals reviewed.  Lab Results  Component Value Date   WBC 10.2 01/14/2017   HGB 13.3 01/14/2017   HCT 40.3 01/14/2017   PLT 205 01/14/2017   GLUCOSE 103 (H) 01/14/2017   CHOL 224 (H) 12/18/2015   TRIG 200.0 (H) 12/18/2015   HDL 52.30 12/18/2015   LDLDIRECT 144.7 02/09/2012   LDLCALC 132 (H) 12/18/2015   ALT 12 07/22/2016   AST 15 07/22/2016   NA 141 01/14/2017   K 3.7 01/14/2017   CL 106 01/14/2017   CREATININE 0.77 01/14/2017   BUN 18 01/14/2017   CO2 27 01/14/2017   TSH 1.97 12/18/2015   INR 0.95 01/15/2012   HGBA1C 5.7 01/02/2011    Assessment & Plan:   Acquanetta was seen today for cough.  Diagnoses and all orders for this visit:  Influenza A -     POC Influenza A&B(BINAX/QUICKVUE) -     DG Chest 2 View; Future -     oseltamivir (TAMIFLU) 75 MG capsule; Take 1 capsule (75 mg total) by mouth 2 (two) times daily. -     HYDROcodone-homatropine (HYCODAN) 5-1.5 MG/5ML syrup; Take 5 mLs by mouth every 12 (twelve) hours as needed for cough. -     dextromethorphan-guaiFENesin (MUCINEX DM) 30-600 MG 12hr tablet; Take 1 tablet by mouth 2 (two) times daily as needed for cough. -     ipratropium (ATROVENT) 0.03 % nasal spray; Place 2 sprays into both nostrils 2 (two) times daily. Do not use for more than 5days. -     albuterol (PROVENTIL HFA;VENTOLIN HFA) 108 (90 Base) MCG/ACT inhaler; Inhale 1-2 puffs into the lungs every 6 (six) hours as needed. -     DG Chest 2 View  Community acquired pneumonia of left lower lobe of lung ([place]) -     DG Chest 2 View; Future -     oseltamivir (TAMIFLU) 75 MG capsule; Take 1 capsule (75 mg total) by mouth 2 (two) times daily. -     HYDROcodone-homatropine (HYCODAN) 5-1.5 MG/5ML syrup; Take 5 mLs by mouth every 12 (twelve) hours as needed for cough. -     dextromethorphan-guaiFENesin (MUCINEX DM) 30-600 MG 12hr tablet; Take 1 tablet by mouth 2 (two) times daily as needed for cough. -     ipratropium (ATROVENT) 0.03 % nasal  spray; Place 2 sprays into both nostrils 2 (two) times daily. Do not use for more than 5days. -     albuterol (PROVENTIL HFA;VENTOLIN HFA) 108 (90 Base) MCG/ACT inhaler; Inhale 1-2 puffs into the lungs every 6 (six) hours as needed. -     DG Chest 2 View -     levofloxacin (LEVAQUIN) 500 MG tablet; Take 1 tablet (500 mg total) by mouth daily.   I am having Cynthia Garza start on oseltamivir, HYDROcodone-homatropine, dextromethorphan-guaiFENesin, ipratropium, albuterol, and levofloxacin. I am also having her maintain her ranitidine, vitamin B-12, ALIVE ONCE DAILY WOMENS 50+, valsartan-hydrochlorothiazide,  zolpidem, anastrozole, buPROPion, amLODipine, Multiple Vitamins-Minerals (PRESERVISION AREDS 2 PO), aspirin EC, Propylene Glycol, and meclizine. We will continue to administer methylPREDNISolone acetate.  Meds ordered this encounter  Medications  . oseltamivir (TAMIFLU) 75 MG capsule    Sig: Take 1 capsule (75 mg total) by mouth 2 (two) times daily.    Dispense:  10 capsule    Refill:  0    Order Specific Question:   Supervising Provider    Answer:   Lucille Passy [[id number]]  . HYDROcodone-homatropine (HYCODAN) 5-1.5 MG/5ML syrup    Sig: Take 5 mLs by mouth every 12 (twelve) hours as needed for cough.    Dispense:  60 mL    Refill:  0    Order Specific Question:   Supervising Provider    Answer:   Lucille Passy [[id number]]  . dextromethorphan-guaiFENesin (MUCINEX DM) 30-600 MG 12hr tablet    Sig: Take 1 tablet by mouth 2 (two) times daily as needed for cough.    Dispense:  14 tablet    Refill:  0    Order Specific Question:   Supervising Provider    Answer:   Lucille Passy [[id number]]  . ipratropium (ATROVENT) 0.03 % nasal spray    Sig: Place 2 sprays into both nostrils 2 (two) times daily. Do not use for more than 5days.    Dispense:  30 mL    Refill:  0    Order Specific Question:   Supervising Provider    Answer:   Lucille Passy [[id number]]  . albuterol (PROVENTIL HFA;VENTOLIN HFA) 108 (90  Base) MCG/ACT inhaler    Sig: Inhale 1-2 puffs into the lungs every 6 (six) hours as needed.    Dispense:  1 Inhaler    Refill:  0    Order Specific Question:   Supervising Provider    Answer:   Lucille Passy [[id number]]  . levofloxacin (LEVAQUIN) 500 MG tablet    Sig: Take 1 tablet (500 mg total) by mouth daily.    Dispense:  5 tablet    Refill:  0    Order Specific Question:   Supervising Provider    Answer:   Lucille Passy [[id number]]    Follow-up: Return in about 1 week (around 07/10/2017) for LLL pneumonia and influenza.  Wilfred Lacy, NP

## 2017-07-03 NOTE — Telephone Encounter (Signed)
Daughter is calling to report her mother is complaining of chest pain with cough. She states she is not feeling well and the daughter is wanting her to be seen in office today. Unable to triage patient- daughter is at work and is going to bring her mother in for appointment.

## 2017-07-06 ENCOUNTER — Encounter: Payer: Self-pay | Admitting: Nurse Practitioner

## 2017-07-09 ENCOUNTER — Other Ambulatory Visit: Payer: Self-pay | Admitting: Internal Medicine

## 2017-07-30 ENCOUNTER — Other Ambulatory Visit: Payer: Self-pay | Admitting: Nurse Practitioner

## 2017-07-30 DIAGNOSIS — J101 Influenza due to other identified influenza virus with other respiratory manifestations: Secondary | ICD-10-CM

## 2017-07-30 DIAGNOSIS — J189 Pneumonia, unspecified organism: Secondary | ICD-10-CM

## 2017-07-30 DIAGNOSIS — J181 Lobar pneumonia, unspecified organism: Secondary | ICD-10-CM

## 2017-08-04 ENCOUNTER — Inpatient Hospital Stay: Payer: Medicare Other | Attending: Oncology | Admitting: Oncology

## 2017-08-04 ENCOUNTER — Inpatient Hospital Stay: Payer: Medicare Other

## 2017-08-04 ENCOUNTER — Telehealth: Payer: Self-pay | Admitting: Oncology

## 2017-08-04 VITALS — BP 141/70 | HR 75 | Temp 98.3°F | Resp 18 | Ht 60.0 in | Wt 133.6 lb

## 2017-08-04 DIAGNOSIS — Z79811 Long term (current) use of aromatase inhibitors: Secondary | ICD-10-CM | POA: Diagnosis not present

## 2017-08-04 DIAGNOSIS — C50411 Malignant neoplasm of upper-outer quadrant of right female breast: Secondary | ICD-10-CM | POA: Diagnosis not present

## 2017-08-04 DIAGNOSIS — Z170 Estrogen receptor positive status [ER+]: Secondary | ICD-10-CM | POA: Insufficient documentation

## 2017-08-04 LAB — CBC WITH DIFFERENTIAL/PLATELET
Basophils Absolute: 0.1 10*3/uL (ref 0.0–0.1)
Basophils Relative: 1 %
EOS ABS: 0.2 10*3/uL (ref 0.0–0.5)
EOS PCT: 2 %
HCT: 42.3 % (ref 34.8–46.6)
Hemoglobin: 14 g/dL (ref 11.6–15.9)
LYMPHS ABS: 1.9 10*3/uL (ref 0.9–3.3)
Lymphocytes Relative: 25 %
MCH: 27.7 pg (ref 25.1–34.0)
MCHC: 33 g/dL (ref 31.5–36.0)
MCV: 83.8 fL (ref 79.5–101.0)
MONO ABS: 0.9 10*3/uL (ref 0.1–0.9)
MONOS PCT: 12 %
Neutro Abs: 4.4 10*3/uL (ref 1.5–6.5)
Neutrophils Relative %: 60 %
PLATELETS: 212 10*3/uL (ref 145–400)
RBC: 5.04 MIL/uL (ref 3.70–5.45)
RDW: 13.9 % (ref 11.2–14.5)
WBC: 7.4 10*3/uL (ref 3.9–10.3)

## 2017-08-04 LAB — COMPREHENSIVE METABOLIC PANEL
ALBUMIN: 4 g/dL (ref 3.5–5.0)
ALT: 10 U/L (ref 0–55)
ANION GAP: 10 (ref 3–11)
AST: 15 U/L (ref 5–34)
Alkaline Phosphatase: 62 U/L (ref 40–150)
BILIRUBIN TOTAL: 0.5 mg/dL (ref 0.2–1.2)
BUN: 21 mg/dL (ref 7–26)
CHLORIDE: 105 mmol/L (ref 98–109)
CO2: 26 mmol/L (ref 22–29)
Calcium: 9.7 mg/dL (ref 8.4–10.4)
Creatinine, Ser: 0.92 mg/dL (ref 0.60–1.10)
GFR calc Af Amer: >60 mL/min (ref >60)
GFR calc non Af Amer: 56 mL/min — ABNORMAL LOW (ref >60)
GLUCOSE: 103 mg/dL (ref 70–140)
POTASSIUM: 3.8 mmol/L (ref 3.5–5.1)
Sodium: 141 mmol/L (ref 136–145)
TOTAL PROTEIN: 7.1 g/dL (ref 6.4–8.3)

## 2017-08-04 MED ORDER — ANASTROZOLE 1 MG PO TABS: ORAL_TABLET | ORAL | 3 refills | Status: DC

## 2017-08-04 NOTE — Telephone Encounter (Signed)
Gave patient AVs and calendar of upcoming November  And April 2020 appointments.

## 2017-08-04 NOTE — Progress Notes (Signed)
[place]  [phone] [phone]     ID: Cynthia Garza OB: August 07, 1932  MR#: [id number]  EHO#:[id number]  PCP: Cassandria Anger, MD GYN:   SU: Autumn Messing OTHER MD: Thea Silversmith  CHIEF COMPLAINT: estrogen receptor positive breast cancer  CURRENT TREATMENT: anastrozole  BREAST CANCER HISTORY: From Dr.Khan's 06/08/2013 note:  "1. [The patient] underwent a six-month followup mammogram for left-sided abnormalities. On her mammogram she was noted to have a right breast mass at the 12:00 position. By ultrasound it measured 1.5 cm. Patient went on to have a biopsy performed of this mass. This revealed intermediate grade invasive ductal carcinoma ER positive PR positive HER-2/neu negative with a proliferation marker Ki-67 22%. She had MRI of the breasts performed. The MRI showed a 1.3 cm enhancement known for the by biopsy with 2 small satellite lesion. She was also noted to have a another linear enhancement as well in the right breast. On the left side she was noted to have a 7 mm mass by ultrasound this measured 8 mm this was at the 7:00 position. Patient had a biopsy of this performed. By pathology report report this is consistent with an invasive cancer.   2. status post lumpectomy performed on 07/07/2013 of the left breast. The final pathology revealed a 0.7 cm invasive ductal carcinoma with ductal carcinoma in situ. Sentinel lymph node was negative for metastatic disease. Tumor was ER positive PR positive HER-2/neu negative with a proliferation marker Ki-67 13%. Stage I (T1 N0)  #3 patient had Oncotype DX testing performed her breast cancer recurrence score was 0 giving her a 3% risk of distant recurrence with tamoxifen for 5 years. Patient will not need chemotherapy but will need antiestrogen therapy"  Her subsequent history is as detailed below.  INTERVAL HISTORY: Cynthia Garza returns today for follow-up of her estrogen receptor positive breast  cancer. She continues on anastrozole, with good tolerance. She denies issues with hot flashes or vaginal dryness. She attributes her arthralgias to weather changes. She pays $17 for a 90 day supply.   Since her last visit, she underwent diagnostic bilateral mammography with CAD and tomography on 04/07/2017 at [facility]: breast density category B. There was no evidence of malignancy.    REVIEW OF SYSTEMS: Cynthia Garza reports that she had the flu along with pneumonia. She was given Tamiflu and steroids. She says that she had the flu and pneumonia shot, so she is confused of how she got sick. She had painful cough. She feels better now. She has been taking care of her husband who has been sick and less active. For exercise, she climbs the stairs and walks some inside and outside her house. She denies unusual headaches, visual changes, nausea, vomiting, or dizziness. There has been no unusual cough, phlegm production, or pleurisy. This been no change in bowel or bladder habits. She denies unexplained fatigue or unexplained weight loss, bleeding, rash, or fever. A detailed review of systems was otherwise stable.    PAST MEDICAL HISTORY: Past Medical History:  Diagnosis Date  . Anxiety   . Breast cancer ([place]) 2015   ER+/PR+/Her2-  . Depression   . Dyslipidemia   . GERD (gastroesophageal reflux disease)   . Heart murmur   . Hemangioma of intra-abdominal structures   . HTN (hypertension)   . Insomnia   . LBP (low back pain)   . Osteoporosis   . Paresthesia   . Radiation 08/08/13-08/29/13   Bilat.breast 42.72 Gy  . Renal cyst   .  Shingles     PAST SURGICAL HISTORY: Past Surgical History:  Procedure Laterality Date  . ABDOMINAL HYSTERECTOMY     ?1970's  . BREAST LUMPECTOMY WITH NEEDLE LOCALIZATION AND AXILLARY SENTINEL LYMPH NODE BX Bilateral 07/07/2013   Procedure: BREAST LUMPECTOMY WITH NEEDLE LOCALIZATION AND AXILLARY SENTINEL LYMPH NODE BIOPIES;  Surgeon: Paul S Toth III, MD;  Location: [facility] OR;   Service: General;  Laterality: Bilateral;    FAMILY HISTORY Family History  Problem Relation Age of Onset  . Kidney failure Mother   . Hypertension Father   . Esophageal cancer Father   . Coronary artery disease Neg Hx     GYNECOLOGIC HISTORY:  Menarche age 13, first live birth age 26, she is GX P2. She underwent menopause in the late 1970s. She did not take hormone replacement.  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 Her husband Kelly used to work in diesel engine repair. The patient's daughter Cynthia Garza used to be a social worker but now works in finance as. Daughter Cynthia Garza is a homemaker. The patient has 4 grandchildren. She attends a local [employer]    ADVANCED DIRECTIVES: In place   HEALTH MAINTENANCE: Social History   Tobacco Use  . Smoking status: Never Smoker  . Smokeless tobacco: Never Used  Substance Use Topics  . Alcohol use: No  . Drug use: No     Colonoscopy:  PAP:  Bone density:  Lipid panel:  Allergies  Allergen Reactions  . Cholestoff [Plant Sterols And Stanols]     unknown  . Lexapro [Escitalopram Oxalate]     fatigue  . Simvastatin Other (See Comments)    aches  . Diflucan [Fluconazole] Rash    Current Outpatient Medications  Medication Sig Dispense Refill  . albuterol (PROVENTIL HFA;VENTOLIN HFA) 108 (90 Base) MCG/ACT inhaler Inhale 1-2 puffs into the lungs every 6 (six) hours as needed. 1 Inhaler 0  . amLODipine (NORVASC) 5 MG tablet TAKE 1 TABLET BY MOUTH EVERY DAY 90 tablet 1  . anastrozole (ARIMIDEX) 1 MG tablet TAKE 1 TABLET (1 MG TOTAL) BY MOUTH DAILY. 90 tablet 3  . aspirin EC 81 MG tablet Take 81 mg by mouth at bedtime.    . buPROPion (WELLBUTRIN XL) 150 MG 24 hr tablet TAKE 1 TABLET BY MOUTH EVERY DAY 90 tablet 3  . dextromethorphan-guaiFENesin (MUCINEX DM) 30-600 MG 12hr tablet Take 1 tablet by mouth 2 (two) times daily as needed for cough. 14 tablet 0  .  HYDROcodone-homatropine (HYCODAN) 5-1.5 MG/5ML syrup Take 5 mLs by mouth every 12 (twelve) hours as needed for cough. 60 mL 0  . ipratropium (ATROVENT) 0.03 % nasal spray PLACE 2 SPRAYS INTO BOTH NOSTRILS 2 (TWO) TIMES DAILY. DO NOT USE FOR MORE THAN 5DAYS. 30 mL 0  . levofloxacin (LEVAQUIN) 500 MG tablet Take 1 tablet (500 mg total) by mouth daily. 5 tablet 0  . meclizine (ANTIVERT) 12.5 MG tablet Take 1 tablet (12.5 mg total) by mouth 2 (two) times daily as needed for dizziness. 60 tablet 2  . Multiple Vitamins-Minerals (ALIVE ONCE DAILY WOMENS 50+) TABS Take 1 tablet by mouth daily.    . Multiple Vitamins-Minerals (PRESERVISION AREDS 2 PO) Take 1 tablet by mouth 2 (two) times daily.    . oseltamivir (TAMIFLU) 75 MG capsule Take 1 capsule (75 mg total) by mouth 2 (two) times daily. 10 capsule 0  . Propylene Glycol (SYSTANE BALANCE) 0.6 % SOLN Place   2 drops into both eyes at bedtime.    . ranitidine (ZANTAC) 150 MG tablet Take 150 mg by mouth 2 (two) times daily.    . valsartan-hydrochlorothiazide (DIOVAN HCT) 320-25 MG tablet Take 1 tablet by mouth daily. (Patient taking differently: Take 1 tablet by mouth daily with breakfast. ) 90 tablet 3  . vitamin B-12 (CYANOCOBALAMIN) 1000 MCG tablet Take 1,000 mcg by mouth daily.    Marland Kitchen zolpidem (AMBIEN) 10 MG tablet Take 1 tablet (10 mg total) by mouth at bedtime as needed for sleep. (Patient taking differently: Take 5 mg by mouth at bedtime. ) 90 tablet 1   Current Facility-Administered Medications  Medication Dose Route Frequency Provider Last Rate Last Dose  . methylPREDNISolone acetate (DEPO-MEDROL) injection 80 mg  80 mg Intra-articular Once Plotnikov, Evie Lacks, MD        OBJECTIVE: Older white woman who appears well  Vitals:   08/04/17 1145  BP: (!) 141/70  Pulse: 75  Resp: 18  Temp: 98.3 F (36.8 C)  SpO2: 96%     Body mass index is 26.09 kg/m.    ECOG FS:0 - Asymptomatic  Sclerae unicteric, EOMs intact Oropharynx clear and moist No  cervical or supraclavicular adenopathy Lungs no rales or rhonchi, good excursion bilaterally Heart regular rate and rhythm Abd soft, nontender, positive bowel sounds MSK no focal spinal tenderness, no upper extremity lymphedema Neuro: nonfocal, well oriented, appropriate affect Breasts: Status post bilateral lumpectomy.  There is no evidence of local recurrence.  Both axillae are benign.    LAB RESULTS:  CMP     Component Value Date/Time   NA 141 08/04/2017 1132   NA 142 07/22/2016 1351   K 3.8 08/04/2017 1132   K 3.7 07/22/2016 1351   CL 105 08/04/2017 1132   CO2 26 08/04/2017 1132   CO2 27 07/22/2016 1351   GLUCOSE 103 08/04/2017 1132   GLUCOSE 132 07/22/2016 1351   BUN 21 08/04/2017 1132   BUN 18.7 07/22/2016 1351   CREATININE 0.92 08/04/2017 1132   CREATININE 1.0 07/22/2016 1351   CALCIUM 9.7 08/04/2017 1132   CALCIUM 9.5 07/22/2016 1351   PROT 7.1 08/04/2017 1132   PROT 6.8 07/22/2016 1351   ALBUMIN 4.0 08/04/2017 1132   ALBUMIN 4.0 07/22/2016 1351   AST 15 08/04/2017 1132   AST 15 07/22/2016 1351   ALT 10 08/04/2017 1132   ALT 12 07/22/2016 1351   ALKPHOS 62 08/04/2017 1132   ALKPHOS 70 07/22/2016 1351   BILITOT 0.5 08/04/2017 1132   BILITOT 0.52 07/22/2016 1351   GFRNONAA 56 (L) 08/04/2017 1132   GFRAA >60 08/04/2017 1132    I No results found for: SPEP  Lab Results  Component Value Date   WBC 7.4 08/04/2017   NEUTROABS 4.4 08/04/2017   HGB 14.0 08/04/2017   HCT 42.3 08/04/2017   MCV 83.8 08/04/2017   PLT 212 08/04/2017      Chemistry      Component Value Date/Time   NA 141 08/04/2017 1132   NA 142 07/22/2016 1351   K 3.8 08/04/2017 1132   K 3.7 07/22/2016 1351   CL 105 08/04/2017 1132   CO2 26 08/04/2017 1132   CO2 27 07/22/2016 1351   BUN 21 08/04/2017 1132   BUN 18.7 07/22/2016 1351   CREATININE 0.92 08/04/2017 1132   CREATININE 1.0 07/22/2016 1351      Component Value Date/Time   CALCIUM 9.7 08/04/2017 1132   CALCIUM 9.5  07/22/2016 1351  ALKPHOS 62 08/04/2017 1132   ALKPHOS 70 07/22/2016 1351   AST 15 08/04/2017 1132   AST 15 07/22/2016 1351   ALT 10 08/04/2017 1132   ALT 12 07/22/2016 1351   BILITOT 0.5 08/04/2017 1132   BILITOT 0.52 07/22/2016 1351       No results found for: LABCA2  No components found for: LABCA125  No results for input(s): INR in the last 168 hours.  Urinalysis    Component Value Date/Time   COLORURINE YELLOW 12/18/2015 0936   APPEARANCEUR CLEAR 12/18/2015 0936   LABSPEC 1.010 12/18/2015 0936   PHURINE 7.0 12/18/2015 0936   GLUCOSEU NEGATIVE 12/18/2015 0936   HGBUR NEGATIVE 12/18/2015 0936   BILIRUBINUR NEGATIVE 12/18/2015 0936   BILIRUBINUR neg 12/03/2011 1540   KETONESUR NEGATIVE 12/18/2015 0936   PROTEINUR 100 (A) 12/04/2011 1640   UROBILINOGEN 0.2 12/18/2015 0936   NITRITE NEGATIVE 12/18/2015 0936   LEUKOCYTESUR NEGATIVE 12/18/2015 0936    STUDIES: Since her last visit, she underwent diagnostic bilateral mammography with CAD and tomography on 04/07/2017 at [facility]: breast density category B. There was no evidence of malignancy.   ASSESSMENT: 84 y.o. [place] woman with bilateral breast cancers  (1) RIGHT BREAST: Status post right lumpectomy and right axillary lymph node sampling 07/07/2013 for a pT1c pN0, stage IA invasive ductal carcinoma, grade 2, estrogen receptor 100% positive, progesterone receptor 97% positive, with an MIB-1 of 22% and no HER-2 amplification  (a) Oncotype DX score of 0 predicts an outside the breast recurrence of 3% within 10 years if the patient's only systemic treatment is tamoxifen for 5 years  (b) adjuvant radiation completed 08/29/2013  (2) LEFT BREAST: Status post left lumpectomy 07/07/2013 for a pT1b pN0, stage IA invasive ductal carcinoma, grade 2, estrogen receptor 100% positive, progesterone receptor 54% positive, with an MIB-1 of 13% and no HER-2 amplification  (b) Completed adjuvant radiation 08/29/2013  (3) SYSTEMIC  THERAPY:   (a) started on anastrozole 09/23/2013   (i) bone density at [facility] 07/12/2014 was normal with a T score of -0.9.  PLAN: Cynthia Garza is now 4 years out from definitive surgery for breast cancer with no evidence of disease recurrence.  This is very favorable.  She is tolerating anastrozole remarkably well.  The plan is to continue that for a total of 5 years which will take us to May 2020  She does need a repeat bone density.  This has been ordered to be done at the same time as her next mammogram  She will "graduate" when she returns to see me in April 2020  She knows to call for any other issues that may develop before that visit.   ,  C, MD  08/04/17 12:15 PM Medical Oncology and Hematology [facility] [address] [place], [place] [address] Tel. [phone]    Fax. [phone]  This document serves as a record of services personally performed by  , MD. It was created on his behalf by Arielle Pollard, a trained medical scribe. The creation of this record is based on the scribe's personal observations and the provider's statements to them.   I have reviewed the above documentation for accuracy and completeness, and I agree with the above.    

## 2017-08-12 ENCOUNTER — Ambulatory Visit: Payer: Medicare Other | Admitting: Internal Medicine

## 2017-08-13 ENCOUNTER — Other Ambulatory Visit: Payer: Self-pay | Admitting: Nurse Practitioner

## 2017-08-13 DIAGNOSIS — J189 Pneumonia, unspecified organism: Secondary | ICD-10-CM

## 2017-08-13 DIAGNOSIS — J181 Lobar pneumonia, unspecified organism: Secondary | ICD-10-CM

## 2017-08-13 DIAGNOSIS — J101 Influenza due to other identified influenza virus with other respiratory manifestations: Secondary | ICD-10-CM

## 2017-08-14 ENCOUNTER — Ambulatory Visit (INDEPENDENT_AMBULATORY_CARE_PROVIDER_SITE_OTHER): Payer: Medicare Other | Admitting: Internal Medicine

## 2017-08-14 ENCOUNTER — Encounter: Payer: Self-pay | Admitting: Internal Medicine

## 2017-08-14 ENCOUNTER — Ambulatory Visit (INDEPENDENT_AMBULATORY_CARE_PROVIDER_SITE_OTHER)
Admission: RE | Admit: 2017-08-14 | Discharge: 2017-08-14 | Disposition: A | Discharge status: Home / Self Care | Payer: Medicare Other | Source: Ambulatory Visit | Attending: Internal Medicine | Admitting: Internal Medicine

## 2017-08-14 DIAGNOSIS — I10 Essential (primary) hypertension: Secondary | ICD-10-CM | POA: Diagnosis not present

## 2017-08-14 DIAGNOSIS — F329 Major depressive disorder, single episode, unspecified: Secondary | ICD-10-CM

## 2017-08-14 DIAGNOSIS — J189 Pneumonia, unspecified organism: Secondary | ICD-10-CM | POA: Diagnosis not present

## 2017-08-14 DIAGNOSIS — E785 Hyperlipidemia, unspecified: Secondary | ICD-10-CM

## 2017-08-14 DIAGNOSIS — R413 Other amnesia: Secondary | ICD-10-CM

## 2017-08-14 DIAGNOSIS — F419 Anxiety disorder, unspecified: Secondary | ICD-10-CM

## 2017-08-14 DIAGNOSIS — F32A Depression, unspecified: Secondary | ICD-10-CM

## 2017-08-14 MED ORDER — ZOLPIDEM TARTRATE 10 MG PO TABS: 10.0000 mg | ORAL_TABLET | Freq: Every evening | ORAL | 1 refills | Status: DC | PRN

## 2017-08-14 NOTE — Assessment & Plan Note (Signed)
Lorazepam prn  Potential benefits of a long term benzodiazepines  use as well as potential risks  and complications were explained to the patient and were aknowledged.  

## 2017-08-14 NOTE — Assessment & Plan Note (Signed)
Statin intolerant 

## 2017-08-14 NOTE — Progress Notes (Signed)
Subjective:  Patient ID: Cynthia Garza, female    DOB: May 26, 1933  Age: 84 y.o. MRN: [id number]  CC: No chief complaint on file.   HPI Cynthia Garza presents for pneumonia 07/03/17, depression, B12 def f/u  Outpatient Medications Prior to Visit  Medication Sig Dispense Refill  . amLODipine (NORVASC) 5 MG tablet TAKE 1 TABLET BY MOUTH EVERY DAY 90 tablet 1  . anastrozole (ARIMIDEX) 1 MG tablet TAKE 1 TABLET (1 MG TOTAL) BY MOUTH DAILY. 90 tablet 3  . aspirin EC 81 MG tablet Take 81 mg by mouth at bedtime.    Marland Kitchen buPROPion (WELLBUTRIN XL) 150 MG 24 hr tablet TAKE 1 TABLET BY MOUTH EVERY DAY 90 tablet 3  . ipratropium (ATROVENT) 0.03 % nasal spray PLACE 2 SPRAYS INTO BOTH NOSTRILS 2 (TWO) TIMES DAILY. DO NOT USE FOR MORE THAN 5DAYS. 30 mL 0  . meclizine (ANTIVERT) 12.5 MG tablet Take 1 tablet (12.5 mg total) by mouth 2 (two) times daily as needed for dizziness. 60 tablet 2  . Multiple Vitamins-Minerals (ALIVE ONCE DAILY WOMENS 50+) TABS Take 1 tablet by mouth daily.    . Multiple Vitamins-Minerals (PRESERVISION AREDS 2 PO) Take 1 tablet by mouth 2 (two) times daily.    Marland Kitchen Propylene Glycol (SYSTANE BALANCE) 0.6 % SOLN Place 2 drops into both eyes at bedtime.    . ranitidine (ZANTAC) 150 MG tablet Take 150 mg by mouth 2 (two) times daily.    . valsartan-hydrochlorothiazide (DIOVAN HCT) 320-25 MG tablet Take 1 tablet by mouth daily. (Patient taking differently: Take 1 tablet by mouth daily with breakfast. ) 90 tablet 3  . vitamin B-12 (CYANOCOBALAMIN) 1000 MCG tablet Take 1,000 mcg by mouth daily.    Marland Kitchen zolpidem (AMBIEN) 10 MG tablet Take 1 tablet (10 mg total) by mouth at bedtime as needed for sleep. (Patient taking differently: Take 5 mg by mouth at bedtime. ) 90 tablet 1   No facility-administered medications prior to visit.     ROS Review of Systems  Constitutional: Negative for activity change, appetite change, chills, fatigue and unexpected weight change.  HENT: Negative for  congestion, mouth sores and sinus pressure.   Eyes: Negative for visual disturbance.  Respiratory: Negative for cough and chest tightness.   Gastrointestinal: Negative for abdominal pain and nausea.  Genitourinary: Negative for difficulty urinating, frequency and vaginal pain.  Musculoskeletal: Negative for back pain and gait problem.  Skin: Negative for pallor and rash.  Neurological: Negative for dizziness, tremors, weakness, numbness and headaches.  Psychiatric/Behavioral: Negative for confusion and sleep disturbance.    Objective:  BP 138/72 (BP Location: Left Arm, Patient Position: Sitting, Cuff Size: Normal)   Pulse 80   Temp 98 F (36.7 C) (Oral)   Ht 5' (1.524 m)   Wt 137 lb (62.1 kg)   SpO2 98%   BMI 26.76 kg/m   BP Readings from Last 3 Encounters:  08/14/17 138/72  08/04/17 (!) 141/70  07/03/17 132/76    Wt Readings from Last 3 Encounters:  08/14/17 137 lb (62.1 kg)  08/04/17 133 lb 9.6 oz (60.6 kg)  07/03/17 136 lb (61.7 kg)    Physical Exam  Constitutional: She appears well-developed. No distress.  HENT:  Head: Normocephalic.  Right Ear: External ear normal.  Left Ear: External ear normal.  Nose: Nose normal.  Mouth/Throat: Oropharynx is clear and moist.  Eyes: Pupils are equal, round, and reactive to light. Conjunctivae are normal. Right eye exhibits no discharge. Left eye exhibits  no discharge.  Neck: Normal range of motion. Neck supple. No JVD present. No tracheal deviation present. No thyromegaly present.  Cardiovascular: Normal rate, regular rhythm and normal heart sounds.  Pulmonary/Chest: No stridor. No respiratory distress. She has no wheezes.  Abdominal: Soft. Bowel sounds are normal. She exhibits no distension and no mass. There is no tenderness. There is no rebound and no guarding.  Musculoskeletal: She exhibits no edema or tenderness.  Lymphadenopathy:    She has no cervical adenopathy.  Neurological: She displays normal reflexes. No cranial  nerve deficit. She exhibits normal muscle tone. Coordination normal.  Skin: No rash noted. No erythema.  Psychiatric: She has a normal mood and affect. Her behavior is normal. Judgment and thought content normal.    Lab Results  Component Value Date   WBC 7.4 08/04/2017   HGB 14.0 08/04/2017   HCT 42.3 08/04/2017   PLT 212 08/04/2017   GLUCOSE 103 08/04/2017   CHOL 224 (H) 12/18/2015   TRIG 200.0 (H) 12/18/2015   HDL 52.30 12/18/2015   LDLDIRECT 144.7 02/09/2012   LDLCALC 132 (H) 12/18/2015   ALT 10 08/04/2017   AST 15 08/04/2017   NA 141 08/04/2017   K 3.8 08/04/2017   CL 105 08/04/2017   CREATININE 0.92 08/04/2017   BUN 21 08/04/2017   CO2 26 08/04/2017   TSH 1.97 12/18/2015   INR 0.95 01/15/2012   HGBA1C 5.7 01/02/2011    No results found.  Assessment & Plan:   There are no diagnoses linked to this encounter. I am having Cynthia Garza maintain her ranitidine, vitamin B-12, ALIVE ONCE DAILY WOMENS 50+, valsartan-hydrochlorothiazide, zolpidem, buPROPion, Multiple Vitamins-Minerals (PRESERVISION AREDS 2 PO), aspirin EC, Propylene Glycol, meclizine, amLODipine, anastrozole, and ipratropium.  No orders of the defined types were placed in this encounter.    Follow-up: No follow-ups on file.  Walker Kehr, MD

## 2017-08-14 NOTE — Assessment & Plan Note (Signed)
Doing well 

## 2017-08-14 NOTE — Assessment & Plan Note (Signed)
BP is hard to test due to h/o B mastectomy - wrist Hyzaar d/c, Amlodipine Diovan HCT 2018

## 2017-08-14 NOTE — Patient Instructions (Signed)
Valerian root for insomnia 

## 2017-08-14 NOTE — Assessment & Plan Note (Signed)
Repeat CXR

## 2017-08-14 NOTE — Assessment & Plan Note (Signed)
Wellbutrin

## 2017-09-18 ENCOUNTER — Other Ambulatory Visit: Payer: Self-pay | Admitting: Internal Medicine

## 2017-10-31 ENCOUNTER — Encounter: Payer: Self-pay | Admitting: Family Medicine

## 2017-10-31 ENCOUNTER — Ambulatory Visit (INDEPENDENT_AMBULATORY_CARE_PROVIDER_SITE_OTHER): Payer: Medicare Other | Admitting: Family Medicine

## 2017-10-31 VITALS — BP 132/64 | HR 72 | Temp 98.1°F | Wt 126.0 lb

## 2017-10-31 DIAGNOSIS — N3001 Acute cystitis with hematuria: Secondary | ICD-10-CM | POA: Diagnosis not present

## 2017-10-31 LAB — POCT URINALYSIS DIP (MANUAL ENTRY)
Bilirubin, UA: NEGATIVE
GLUCOSE UA: NEGATIVE mg/dL
Ketones, POC UA: NEGATIVE mg/dL
Leukocytes, UA: NEGATIVE
NITRITE UA: NEGATIVE
PH UA: 6 (ref 5.0–8.0)
PROTEIN UA: NEGATIVE mg/dL
SPEC GRAV UA: 1.025 (ref 1.010–1.025)
UROBILINOGEN UA: 0.2 U/dL

## 2017-10-31 MED ORDER — CEPHALEXIN 500 MG PO CAPS: 500.0000 mg | ORAL_CAPSULE | Freq: Two times a day (BID) | ORAL | 0 refills | Status: AC

## 2017-10-31 NOTE — Progress Notes (Signed)
CC: Urinary complaint  Cynthia Garza is a 84 y.o. female here for possible UTI.  Duration: 1 week. Symptoms: urinary frequency, hematuria and lower abdominal pain Denies: urinary hesitancy, urinary retention, fever, nausea, vomiting and dysuria, vaginal discharge Hx of recurrent UTI? No  ROS:  Constitutional: denies fever GU: As noted in HPI  Past Medical History:  Diagnosis Date  . Anxiety   . Breast cancer ([place]) 2015   ER+/PR+/Her2-  . Depression   . Dyslipidemia   . GERD (gastroesophageal reflux disease)   . Heart murmur   . Hemangioma of intra-abdominal structures   . HTN (hypertension)   . Insomnia   . LBP (low back pain)   . Osteoporosis   . Paresthesia   . Radiation 08/08/13-08/29/13   Bilat.breast 42.72 Gy  . Renal cyst   . Shingles     BP 132/64   Pulse 72   Temp 98.1 F (36.7 C) (Oral)   Wt 126 lb (57.2 kg)   SpO2 96%   BMI 24.61 kg/m  General: Awake, alert, appears stated age 86: MMM Heart: RRR, no murmurs Lungs: CTAB, normal respiratory effort, no accessory muscle usage Abd: BS+, soft, +TTP in suprapubic region, ND, no masses or organomegaly MSK: No CVA tenderness, neg Lloyd's sign Psych: Age appropriate judgment and insight  Acute cystitis with hematuria - Plan: POCT urinalysis dipstick, cephALEXin (KEFLEX) 500 MG capsule  Orders as above. Given symptoms, will treat and have her follow-up if no improvement.  Unfortunately, we would do not have enough urine for a culture. Stay hydrated. Seek immediate care if pt starts to develop fevers, new/worsening symptoms, uncontrollable N/V. F/u prn. The patient voiced understanding and agreement to the plan.  [place], DO 10/31/17 12:40 PM

## 2017-10-31 NOTE — Patient Instructions (Signed)
Stay hydrated.  If you don't get better, call Dr. Mamie Nick on Monday.  Let us know if you need anything.

## 2017-12-18 ENCOUNTER — Other Ambulatory Visit: Payer: Self-pay | Admitting: Internal Medicine

## 2017-12-29 ENCOUNTER — Encounter: Payer: Self-pay | Admitting: Internal Medicine

## 2017-12-29 ENCOUNTER — Ambulatory Visit (INDEPENDENT_AMBULATORY_CARE_PROVIDER_SITE_OTHER): Payer: Medicare Other | Admitting: Internal Medicine

## 2017-12-29 ENCOUNTER — Other Ambulatory Visit (INDEPENDENT_AMBULATORY_CARE_PROVIDER_SITE_OTHER): Payer: Medicare Other

## 2017-12-29 VITALS — BP 118/62 | HR 74 | Temp 98.0°F | Ht 60.0 in | Wt 133.0 lb

## 2017-12-29 DIAGNOSIS — F32A Depression, unspecified: Secondary | ICD-10-CM

## 2017-12-29 DIAGNOSIS — R5383 Other fatigue: Secondary | ICD-10-CM

## 2017-12-29 DIAGNOSIS — R29898 Other symptoms and signs involving the musculoskeletal system: Secondary | ICD-10-CM | POA: Diagnosis not present

## 2017-12-29 DIAGNOSIS — I10 Essential (primary) hypertension: Secondary | ICD-10-CM | POA: Diagnosis not present

## 2017-12-29 DIAGNOSIS — E876 Hypokalemia: Secondary | ICD-10-CM

## 2017-12-29 DIAGNOSIS — F329 Major depressive disorder, single episode, unspecified: Secondary | ICD-10-CM

## 2017-12-29 LAB — CBC WITH DIFFERENTIAL/PLATELET
BASOS ABS: 0.1 10*3/uL (ref 0.0–0.1)
Basophils Relative: 0.8 % (ref 0.0–3.0)
Eosinophils Absolute: 0.2 10*3/uL (ref 0.0–0.7)
Eosinophils Relative: 2.5 % (ref 0.0–5.0)
HEMATOCRIT: 39.9 % (ref 36.0–46.0)
HEMOGLOBIN: 13.6 g/dL (ref 12.0–15.0)
Lymphocytes Relative: 26.4 % (ref 12.0–46.0)
Lymphs Abs: 2.1 10*3/uL (ref 0.7–4.0)
MCHC: 34.1 g/dL (ref 30.0–36.0)
MCV: 81.8 fl (ref 78.0–100.0)
MONO ABS: 1 10*3/uL (ref 0.1–1.0)
MONOS PCT: 12.5 % — AB (ref 3.0–12.0)
Neutro Abs: 4.7 10*3/uL (ref 1.4–7.7)
Neutrophils Relative %: 57.8 % (ref 43.0–77.0)
PLATELETS: 226 10*3/uL (ref 150.0–400.0)
RBC: 4.88 Mil/uL (ref 3.87–5.11)
RDW: 14.1 % (ref 11.5–15.5)
WBC: 8.1 10*3/uL (ref 4.0–10.5)

## 2017-12-29 LAB — BASIC METABOLIC PANEL
BUN: 20 mg/dL (ref 6–23)
CHLORIDE: 103 meq/L (ref 96–112)
CO2: 28 meq/L (ref 19–32)
Calcium: 9.5 mg/dL (ref 8.4–10.5)
Creatinine, Ser: 1.09 mg/dL (ref 0.40–1.20)
GFR: 50.96 mL/min — ABNORMAL LOW (ref >60.00)
Glucose, Bld: 100 mg/dL — ABNORMAL HIGH (ref 70–99)
POTASSIUM: 3.9 meq/L (ref 3.5–5.1)
Sodium: 140 mEq/L (ref 135–145)

## 2017-12-29 NOTE — Progress Notes (Signed)
Subjective:  Patient ID: Cynthia Garza, female    DOB: November 29, 1932  Age: 84 y.o. MRN: [id number]  CC: No chief complaint on file.   HPI [name] presents for HTN, depression, breast cancer f/u L jaw is popping   Outpatient Medications Prior to Visit  Medication Sig Dispense Refill  . amLODipine (NORVASC) 5 MG tablet TAKE 1 TABLET BY MOUTH EVERY DAY 90 tablet 1  . anastrozole (ARIMIDEX) 1 MG tablet TAKE 1 TABLET (1 MG TOTAL) BY MOUTH DAILY. 90 tablet 3  . aspirin EC 81 MG tablet Take 81 mg by mouth at bedtime.    Marland Kitchen buPROPion (WELLBUTRIN XL) 150 MG 24 hr tablet TAKE 1 TABLET BY MOUTH EVERY DAY 90 tablet 3  . ipratropium (ATROVENT) 0.03 % nasal spray PLACE 2 SPRAYS INTO BOTH NOSTRILS 2 (TWO) TIMES DAILY. DO NOT USE FOR MORE THAN 5DAYS. 30 mL 0  . meclizine (ANTIVERT) 12.5 MG tablet Take 1 tablet (12.5 mg total) by mouth 2 (two) times daily as needed for dizziness. 60 tablet 2  . Multiple Vitamins-Minerals (ALIVE ONCE DAILY WOMENS 50+) TABS Take 1 tablet by mouth daily.    . Multiple Vitamins-Minerals (PRESERVISION AREDS 2 PO) Take 1 tablet by mouth 2 (two) times daily.    Marland Kitchen Propylene Glycol (SYSTANE BALANCE) 0.6 % SOLN Place 2 drops into both eyes at bedtime.    . ranitidine (ZANTAC) 150 MG tablet Take 150 mg by mouth 2 (two) times daily.    . valsartan-hydrochlorothiazide (DIOVAN-HCT) 320-25 MG tablet TAKE 1 TABLET BY MOUTH DAILY. 90 tablet 3  . vitamin B-12 (CYANOCOBALAMIN) 1000 MCG tablet Take 1,000 mcg by mouth daily.    Marland Kitchen zolpidem (AMBIEN) 10 MG tablet Take 1 tablet (10 mg total) by mouth at bedtime as needed for sleep. 90 tablet 1   No facility-administered medications prior to visit.     ROS: Review of Systems  Constitutional: Negative for activity change, appetite change, chills, fatigue and unexpected weight change.  HENT: Negative for congestion, mouth sores and sinus pressure.   Eyes: Negative for visual disturbance.  Respiratory: Negative for cough and  chest tightness.   Gastrointestinal: Negative for abdominal pain and nausea.  Genitourinary: Negative for difficulty urinating, frequency and vaginal pain.  Musculoskeletal: Negative for back pain and gait problem.  Skin: Negative for pallor and rash.  Neurological: Negative for dizziness, tremors, weakness, numbness and headaches.  Psychiatric/Behavioral: Negative for confusion and sleep disturbance. The patient is nervous/anxious.     Objective:  BP 118/62 (BP Location: Left Arm, Patient Position: Sitting, Cuff Size: Normal)   Pulse 74   Temp 98 F (36.7 C) (Oral)   Ht 5' (1.524 m)   Wt 133 lb (60.3 kg)   SpO2 96%   BMI 25.97 kg/m   BP Readings from Last 3 Encounters:  12/29/17 118/62  10/31/17 132/64  08/14/17 138/72    Wt Readings from Last 3 Encounters:  12/29/17 133 lb (60.3 kg)  10/31/17 126 lb (57.2 kg)  08/14/17 137 lb (62.1 kg)    Physical Exam  Constitutional: She appears well-developed. No distress.  HENT:  Head: Normocephalic.  Right Ear: External ear normal.  Left Ear: External ear normal.  Nose: Nose normal.  Mouth/Throat: Oropharynx is clear and moist.  Eyes: Pupils are equal, round, and reactive to light. Conjunctivae are normal. Right eye exhibits no discharge. Left eye exhibits no discharge.  Neck: Normal range of motion. Neck supple. No JVD present. No tracheal deviation present. No  thyromegaly present.  Cardiovascular: Normal rate, regular rhythm and normal heart sounds.  Pulmonary/Chest: No stridor. No respiratory distress. She has no wheezes.  Abdominal: Soft. Bowel sounds are normal. She exhibits no distension and no mass. There is no tenderness. There is no rebound and no guarding.  Musculoskeletal: She exhibits no edema or tenderness.  Lymphadenopathy:    She has no cervical adenopathy.  Neurological: She displays normal reflexes. No cranial nerve deficit. She exhibits normal muscle tone. Coordination normal.  Skin: No rash noted. No  erythema.  Psychiatric: She has a normal mood and affect. Her behavior is normal. Judgment and thought content normal.  L TMJ is sensitive  Lab Results  Component Value Date   WBC 7.4 08/04/2017   HGB 14.0 08/04/2017   HCT 42.3 08/04/2017   PLT 212 08/04/2017   GLUCOSE 103 08/04/2017   CHOL 224 (H) 12/18/2015   TRIG 200.0 (H) 12/18/2015   HDL 52.30 12/18/2015   LDLDIRECT 144.7 02/09/2012   LDLCALC 132 (H) 12/18/2015   ALT 10 08/04/2017   AST 15 08/04/2017   NA 141 08/04/2017   K 3.8 08/04/2017   CL 105 08/04/2017   CREATININE 0.92 08/04/2017   BUN 21 08/04/2017   CO2 26 08/04/2017   TSH 1.97 12/18/2015   INR 0.95 01/15/2012   HGBA1C 5.7 01/02/2011    Dg Chest 2 View  Result Date: 08/14/2017 CLINICAL DATA:  Pneumonia EXAM: CHEST - 2 VIEW COMPARISON:  07/03/2017 FINDINGS: The heart size and mediastinal contours are within normal limits. Both lungs are clear. The visualized skeletal structures are unremarkable. Surgical clips in the right breast. IMPRESSION: No active cardiopulmonary disease. Electronically Signed   By: Kathreen Devoid   On: 08/14/2017 15:31    Assessment & Plan:   There are no diagnoses linked to this encounter.   No orders of the defined types were placed in this encounter.    Follow-up: No follow-ups on file.  Walker Kehr, MD

## 2017-12-29 NOTE — Assessment & Plan Note (Signed)
BMET 

## 2017-12-29 NOTE — Assessment & Plan Note (Signed)
Diovan HCT Amlodipine

## 2017-12-29 NOTE — Assessment & Plan Note (Signed)
Discussed.

## 2017-12-29 NOTE — Assessment & Plan Note (Signed)
L TMJ OA Discussed

## 2017-12-29 NOTE — Patient Instructions (Addendum)
Temporomandibular Joint Syndrome Temporomandibular joint (TMJ) syndrome is a condition that affects the joints between your jaw and your skull. The TMJs are located near your ears and allow your jaw to open and close. These joints and the nearby muscles are involved in all movements of the jaw. People with TMJ syndrome have pain in the area of these joints and muscles. Chewing, biting, or other movements of the jaw can be difficult or painful. TMJ syndrome can be caused by various things. In many cases, the condition is mild and goes away within a few weeks. For some people, the condition can become a long-term problem. What are the causes? Possible causes of TMJ syndrome include:  Grinding your teeth or clenching your jaw. Some people do this when they are under stress.  Arthritis.  Injury to the jaw.  Head or neck injury.  Teeth or dentures that are not aligned well.  In some cases, the cause of TMJ syndrome may not be known. What are the signs or symptoms? The most common symptom is an aching pain on the side of the head in the area of the TMJ. Other symptoms may include:  Pain when moving your jaw, such as when chewing or biting.  Being unable to open your jaw all the way.  Making a clicking sound when you open your mouth.  Headache.  Earache.  Neck or shoulder pain.  How is this diagnosed? Diagnosis can usually be made based on your symptoms, your medical history, and a physical exam. Your health care provider may check the range of motion of your jaw. Imaging tests, such as X-rays or an MRI, are sometimes done. You may need to see your dentist to determine if your teeth and jaw are lined up correctly. How is this treated? TMJ syndrome often goes away on its own. If treatment is needed, the options may include:  Eating soft foods and applying ice or heat.  Medicines to relieve pain or inflammation.  Medicines to relax the muscles.  A splint, bite plate, or  mouthpiece to prevent teeth grinding or jaw clenching.  Relaxation techniques or counseling to help reduce stress.  Transcutaneous electrical nerve stimulation (TENS). This helps to relieve pain by applying an electrical current through the skin.  Acupuncture. This is sometimes helpful to relieve pain.  Jaw surgery. This is rarely needed.  Follow these instructions at home:  Take medicines only as directed by your health care provider.  Eat a soft diet if you are having trouble chewing.  Apply ice to the painful area. ? Put ice in a plastic bag. ? Place a towel between your skin and the bag. ? Leave the ice on for 20 minutes, 2-3 times a day.  Apply a warm compress to the painful area as directed.  Massage your jaw area and perform any jaw stretching exercises as recommended by your health care provider.  If you were given a mouthpiece or bite plate, wear it as directed.  Avoid foods that require a lot of chewing. Do not chew gum.  Keep all follow-up visits as directed by your health care provider. This is important. Contact a health care provider if:  You are having trouble eating.  You have new or worsening symptoms. Get help right away if:  Your jaw locks open or closed. This information is not intended to replace advice given to you by your health care provider. Make sure you discuss any questions you have with your health care  provider. Document Released: 02/04/2001 Document Revised: 01/10/2016 Document Reviewed: 12/15/2013 Elsevier Interactive Patient Education  2018 [place] [facility]

## 2018-01-05 ENCOUNTER — Other Ambulatory Visit: Payer: Self-pay | Admitting: Internal Medicine

## 2018-02-01 DIAGNOSIS — Z6825 Body mass index (BMI) 25.0-25.9, adult: Secondary | ICD-10-CM | POA: Diagnosis not present

## 2018-02-01 DIAGNOSIS — N9089 Other specified noninflammatory disorders of vulva and perineum: Secondary | ICD-10-CM | POA: Diagnosis not present

## 2018-02-01 DIAGNOSIS — Z124 Encounter for screening for malignant neoplasm of cervix: Secondary | ICD-10-CM | POA: Diagnosis not present

## 2018-02-11 DIAGNOSIS — H5212 Myopia, left eye: Secondary | ICD-10-CM | POA: Diagnosis not present

## 2018-02-11 DIAGNOSIS — H524 Presbyopia: Secondary | ICD-10-CM | POA: Diagnosis not present

## 2018-02-11 DIAGNOSIS — H3530 Unspecified macular degeneration: Secondary | ICD-10-CM | POA: Diagnosis not present

## 2018-02-11 DIAGNOSIS — Z961 Presence of intraocular lens: Secondary | ICD-10-CM | POA: Diagnosis not present

## 2018-02-11 DIAGNOSIS — I10 Essential (primary) hypertension: Secondary | ICD-10-CM | POA: Diagnosis not present

## 2018-02-11 DIAGNOSIS — H5201 Hypermetropia, right eye: Secondary | ICD-10-CM | POA: Diagnosis not present

## 2018-03-25 DIAGNOSIS — M79646 Pain in unspecified finger(s): Secondary | ICD-10-CM | POA: Insufficient documentation

## 2018-03-25 DIAGNOSIS — M65312 Trigger thumb, left thumb: Secondary | ICD-10-CM | POA: Diagnosis not present

## 2018-03-25 DIAGNOSIS — M79645 Pain in left finger(s): Secondary | ICD-10-CM | POA: Diagnosis not present

## 2018-03-30 DIAGNOSIS — E349 Endocrine disorder, unspecified: Secondary | ICD-10-CM | POA: Diagnosis not present

## 2018-03-30 DIAGNOSIS — Z780 Asymptomatic menopausal state: Secondary | ICD-10-CM | POA: Diagnosis not present

## 2018-03-30 DIAGNOSIS — Z90710 Acquired absence of both cervix and uterus: Secondary | ICD-10-CM | POA: Diagnosis not present

## 2018-03-30 DIAGNOSIS — Z853 Personal history of malignant neoplasm of breast: Secondary | ICD-10-CM | POA: Diagnosis not present

## 2018-03-30 DIAGNOSIS — Z8262 Family history of osteoporosis: Secondary | ICD-10-CM | POA: Diagnosis not present

## 2018-03-30 LAB — HM MAMMOGRAPHY

## 2018-03-30 LAB — HM DEXA SCAN

## 2018-04-01 ENCOUNTER — Encounter: Payer: Self-pay | Admitting: Internal Medicine

## 2018-04-07 ENCOUNTER — Ambulatory Visit (INDEPENDENT_AMBULATORY_CARE_PROVIDER_SITE_OTHER): Payer: Medicare Other

## 2018-04-07 DIAGNOSIS — Z23 Encounter for immunization: Secondary | ICD-10-CM

## 2018-04-14 DIAGNOSIS — N9089 Other specified noninflammatory disorders of vulva and perineum: Secondary | ICD-10-CM | POA: Diagnosis not present

## 2018-06-27 ENCOUNTER — Other Ambulatory Visit: Payer: Self-pay | Admitting: Internal Medicine

## 2018-06-28 DIAGNOSIS — D485 Neoplasm of uncertain behavior of skin: Secondary | ICD-10-CM | POA: Diagnosis not present

## 2018-06-28 DIAGNOSIS — L820 Inflamed seborrheic keratosis: Secondary | ICD-10-CM | POA: Diagnosis not present

## 2018-06-28 DIAGNOSIS — Z23 Encounter for immunization: Secondary | ICD-10-CM | POA: Diagnosis not present

## 2018-06-28 DIAGNOSIS — L821 Other seborrheic keratosis: Secondary | ICD-10-CM | POA: Diagnosis not present

## 2018-09-08 ENCOUNTER — Other Ambulatory Visit: Payer: Self-pay | Admitting: Internal Medicine

## 2018-09-09 ENCOUNTER — Ambulatory Visit (INDEPENDENT_AMBULATORY_CARE_PROVIDER_SITE_OTHER): Payer: Medicare Other | Admitting: Internal Medicine

## 2018-09-09 ENCOUNTER — Encounter: Payer: Self-pay | Admitting: Internal Medicine

## 2018-09-09 DIAGNOSIS — F5101 Primary insomnia: Secondary | ICD-10-CM | POA: Diagnosis not present

## 2018-09-09 MED ORDER — ZOLPIDEM TARTRATE 10 MG PO TABS: 10.0000 mg | ORAL_TABLET | Freq: Every evening | ORAL | 1 refills | Status: DC | PRN

## 2018-09-09 NOTE — Assessment & Plan Note (Signed)
Zolpidem  Potential benefits of a long term benzodiazepines  use as well as potential risks  and complications were explained to the patient and were aknowledged.  

## 2018-09-09 NOTE — Progress Notes (Signed)
Virtual Visit via Telephone Note  I connected with [name] on 09/09/18 at  2:40 PM EDT by telephone and verified that I am speaking with the correct person using two identifiers.   I discussed the limitations, risks, security and privacy concerns of performing an evaluation and management service by telephone and the availability of in person appointments. I also discussed with the patient that there may be a patient responsible charge related to this service. The patient expressed understanding and agreed to proceed.   History of Present Illness:   Follow-up on insomnia.  Zolpidem is working well.  No side effects Observations/Objective:  Alivya looks well Assessment and Plan:  See plan Follow Up Instructions:    I discussed the assessment and treatment plan with the patient. The patient was provided an opportunity to ask questions and all were answered. The patient agreed with the plan and demonstrated an understanding of the instructions.   The patient was advised to call back or seek an in-person evaluation if the symptoms worsen or if the condition fails to improve as anticipated.  I provided 15  minutes of non-face-to-face time during this encounter.   Walker Kehr, MD

## 2018-09-14 ENCOUNTER — Telehealth: Payer: Self-pay | Admitting: Oncology

## 2018-09-14 NOTE — Telephone Encounter (Signed)
Spoke with patient and she said she did not have a microphone to do webex. Did a call online with another provider and no one could hear her. Changed her appointment to a phone call visit.

## 2018-09-15 ENCOUNTER — Telehealth: Payer: Self-pay | Admitting: Oncology

## 2018-09-15 NOTE — Progress Notes (Signed)
[facility]  [phone] [phone]     ID: Cynthia Garza OB: 1932/09/02  MR#: [id number]  RXV#:[id number]  Patient Care Team: Cassandria Anger, MD as PCP - General (Internal Medicine) , Virgie Dad, MD as Consulting Physician (Oncology) Jovita Kussmaul, MD as Consulting Physician (General Surgery) Thea Silversmith, MD as Referring Physician (Radiation Oncology)  OTHER MD:    CHIEF COMPLAINT: estrogen receptor positive breast cancer  CURRENT TREATMENT: anastrozole   I connected with Cynthia Garza on 09/16/18 at 11:00 AM EDT by telephone visit and verified that I am speaking with the correct person using two identifiers.   I discussed the limitations, risks, security and privacy concerns of performing an evaluation and management service by telemedicine and the availability of in-person appointments. I also discussed with the patient that there may be a patient responsible charge related to this service. The patient expressed understanding and agreed to proceed.   Other persons participating in the visit and their role in the encounter:   - Biomedical scientist, Medical Scribe   Patient's location: home  Provider's location: [facility]   Chief Complaint: estrogen receptor positive breast cancer    BREAST CANCER HISTORY: From Dr.Khan's 06/08/2013 note:  "1. [The patient] underwent a six-month followup mammogram for left-sided abnormalities. On her mammogram she was noted to have a right breast mass at the 12:00 position. By ultrasound it measured 1.5 cm. Patient went on to have a biopsy performed of this mass. This revealed intermediate grade invasive ductal carcinoma ER positive PR positive HER-2/neu negative with a proliferation marker Ki-67 22%. She had MRI of the breasts performed. The MRI showed a 1.3 cm enhancement known for the by biopsy with 2 small satellite lesion. She was also noted to have a another linear  enhancement as well in the right breast. On the left side she was noted to have a 7 mm mass by ultrasound this measured 8 mm this was at the 7:00 position. Patient had a biopsy of this performed. By pathology report report this is consistent with an invasive cancer.   2. status post lumpectomy performed on 07/07/2013 of the left breast. The final pathology revealed a 0.7 cm invasive ductal carcinoma with ductal carcinoma in situ. Sentinel lymph node was negative for metastatic disease. Tumor was ER positive PR positive HER-2/neu negative with a proliferation marker Ki-67 13%. Stage I (T1 N0)  #3 patient had Oncotype DX testing performed her breast cancer recurrence score was 0 giving her a 3% risk of distant recurrence with tamoxifen for 5 years. Patient will not need chemotherapy but will need antiestrogen therapy"  Her subsequent history is as detailed below.   INTERVAL HISTORY: Cynthia Garza was contacted today for follow-up and treatment of her estrogen receptor positive breast cancer.   She continues on anastrozole. She tolerates this well and without any noticeable side effects.    Cynthia Garza's last bone density screening on 03/30/2018, showed a T-score of -0.8, which is considered normal.    Since her last visit here, she underwent digital diagnostic bilateral mammography on 03/31/2019 showing: Breast Density Category B. There is no mammographic evidence for malignancy.    REVIEW OF SYSTEMS: Cynthia Garza has been taking appropraite precautions against COVID-19. She has been staying at home and staying away from crowds and stores. Her daughters do her grocery shopping for her. Her husband has also been staying at home.   The patient denies unusual headaches, visual changes, nausea, vomiting, or dizziness.  There has been no unusual cough, phlegm production, or pleurisy. This been no change in bowel or bladder habits. The patient denies unexplained fatigue or unexplained weight loss, bleeding, rash, or  fever. A detailed review of systems was otherwise noncontributory.    PAST MEDICAL HISTORY: Past Medical History:  Diagnosis Date  . Anxiety   . Breast cancer ([place]) 2015   ER+/PR+/Her2-  . Depression   . Dyslipidemia   . GERD (gastroesophageal reflux disease)   . Heart murmur   . Hemangioma of intra-abdominal structures   . HTN (hypertension)   . Insomnia   . LBP (low back pain)   . Osteoporosis   . Paresthesia   . Radiation 08/08/13-08/29/13   Bilat.breast 42.72 Gy  . Renal cyst   . Shingles     PAST SURGICAL HISTORY: Past Surgical History:  Procedure Laterality Date  . ABDOMINAL HYSTERECTOMY     ?1970's  . BREAST LUMPECTOMY WITH NEEDLE LOCALIZATION AND AXILLARY SENTINEL LYMPH NODE BX Bilateral 07/07/2013   Procedure: BREAST LUMPECTOMY WITH NEEDLE LOCALIZATION AND AXILLARY SENTINEL LYMPH NODE BIOPIES;  Surgeon: Merrie Roof, MD;  Location: [facility];  Service: General;  Laterality: Bilateral;    FAMILY HISTORY Family History  Problem Relation Age of Onset  . Kidney failure Mother   . Hypertension Father   . Esophageal cancer Father   . Coronary artery disease Neg Hx     GYNECOLOGIC HISTORY:  Menarche age 30, first live birth age 75, she is Cynthia Garza P2. She underwent menopause in the late 1970s. She did not take hormone replacement.   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 Her husband [name] used to work in Actuary. The patient's daughter [name] used to be a Education officer, museum but now works in Engineer, mining as. Daughter [name] is a homemaker. The patient has 4 grandchildren. She attends a local [employer]   HEALTH MAINTENANCE: Social History   Tobacco Use  . Smoking status: Never Smoker  . Smokeless tobacco: Never Used  Substance Use Topics  . Alcohol use: No  . Drug use: No     Colonoscopy:  PAP:  Bone density:  Lipid panel:  Allergies  Allergen  Reactions  . Cholestoff [Plant Sterols And Stanols]     unknown  . Lexapro [Escitalopram Oxalate]     fatigue  . Simvastatin Other (See Comments)    aches  . Diflucan [Fluconazole] Rash    Current Outpatient Medications  Medication Sig Dispense Refill  . amLODipine (NORVASC) 5 MG tablet Take 1 tablet (5 mg total) by mouth daily. Patient needs office visit before refills will be given 90 tablet 0  . anastrozole (ARIMIDEX) 1 MG tablet TAKE 1 TABLET (1 MG TOTAL) BY MOUTH DAILY. 90 tablet 3  . aspirin EC 81 MG tablet Take 81 mg by mouth at bedtime.    Marland Kitchen buPROPion (WELLBUTRIN XL) 150 MG 24 hr tablet TAKE 1 TABLET BY MOUTH EVERY DAY 90 tablet 3  . ipratropium (ATROVENT) 0.03 % nasal spray PLACE 2 SPRAYS INTO BOTH NOSTRILS 2 (TWO) TIMES DAILY. DO NOT USE FOR MORE THAN 5DAYS. 30 mL 0  . meclizine (ANTIVERT) 12.5 MG tablet Take 1 tablet (12.5 mg total) by mouth 2 (two) times daily as needed for dizziness. 60 tablet 2  . Multiple Vitamins-Minerals (ALIVE ONCE DAILY WOMENS 50+) TABS Take 1 tablet by mouth daily.    Marland Kitchen  Multiple Vitamins-Minerals (PRESERVISION AREDS 2 PO) Take 1 tablet by mouth 2 (two) times daily.    Marland Kitchen Propylene Glycol (SYSTANE BALANCE) 0.6 % SOLN Place 2 drops into both eyes at bedtime.    . ranitidine (ZANTAC) 150 MG tablet Take 150 mg by mouth 2 (two) times daily.    . valsartan-hydrochlorothiazide (DIOVAN-HCT) 320-25 MG tablet TAKE 1 TABLET BY MOUTH DAILY. 90 tablet 3  . vitamin B-12 (CYANOCOBALAMIN) 1000 MCG tablet Take 1,000 mcg by mouth daily.    Marland Kitchen zolpidem (AMBIEN) 10 MG tablet Take 1 tablet (10 mg total) by mouth at bedtime as needed for sleep. 90 tablet 1   No current facility-administered medications for this visit.     OBJECTIVE: Older white woman   There were no vitals filed for this visit.   There is no height or weight on file to calculate BMI.    ECOG FS:0 - Asymptomatic    LAB RESULTS:  CMP     Component Value Date/Time   NA 140 12/29/2017 1502   NA 142  07/22/2016 1351   K 3.9 12/29/2017 1502   K 3.7 07/22/2016 1351   CL 103 12/29/2017 1502   CO2 28 12/29/2017 1502   CO2 27 07/22/2016 1351   GLUCOSE 100 (H) 12/29/2017 1502   GLUCOSE 132 07/22/2016 1351   BUN 20 12/29/2017 1502   BUN 18.7 07/22/2016 1351   CREATININE 1.09 12/29/2017 1502   CREATININE 1.0 07/22/2016 1351   CALCIUM 9.5 12/29/2017 1502   CALCIUM 9.5 07/22/2016 1351   PROT 7.1 08/04/2017 1132   PROT 6.8 07/22/2016 1351   ALBUMIN 4.0 08/04/2017 1132   ALBUMIN 4.0 07/22/2016 1351   AST 15 08/04/2017 1132   AST 15 07/22/2016 1351   ALT 10 08/04/2017 1132   ALT 12 07/22/2016 1351   ALKPHOS 62 08/04/2017 1132   ALKPHOS 70 07/22/2016 1351   BILITOT 0.5 08/04/2017 1132   BILITOT 0.52 07/22/2016 1351   GFRNONAA 56 (L) 08/04/2017 1132   GFRAA >60 08/04/2017 1132    I No results found for: SPEP  Lab Results  Component Value Date   WBC 8.1 12/29/2017   NEUTROABS 4.7 12/29/2017   HGB 13.6 12/29/2017   HCT 39.9 12/29/2017   MCV 81.8 12/29/2017   PLT 226.0 12/29/2017      Chemistry      Component Value Date/Time   NA 140 12/29/2017 1502   NA 142 07/22/2016 1351   K 3.9 12/29/2017 1502   K 3.7 07/22/2016 1351   CL 103 12/29/2017 1502   CO2 28 12/29/2017 1502   CO2 27 07/22/2016 1351   BUN 20 12/29/2017 1502   BUN 18.7 07/22/2016 1351   CREATININE 1.09 12/29/2017 1502   CREATININE 1.0 07/22/2016 1351      Component Value Date/Time   CALCIUM 9.5 12/29/2017 1502   CALCIUM 9.5 07/22/2016 1351   ALKPHOS 62 08/04/2017 1132   ALKPHOS 70 07/22/2016 1351   AST 15 08/04/2017 1132   AST 15 07/22/2016 1351   ALT 10 08/04/2017 1132   ALT 12 07/22/2016 1351   BILITOT 0.5 08/04/2017 1132   BILITOT 0.52 07/22/2016 1351       No results found for: LABCA2  No components found for: LABCA125  No results for input(s): INR in the last 168 hours.  Urinalysis    Component Value Date/Time   COLORURINE YELLOW 12/18/2015 [place] 12/18/2015 0936    LABSPEC 1.010 12/18/2015 [place]  7.0 12/18/2015 0936   GLUCOSEU NEGATIVE 12/18/2015 0936   HGBUR NEGATIVE 12/18/2015 0936   BILIRUBINUR negative 10/31/2017 1215   BILIRUBINUR neg 12/03/2011 1540   KETONESUR negative 10/31/2017 1215   KETONESUR NEGATIVE 12/18/2015 0936   PROTEINUR negative 10/31/2017 1215   PROTEINUR 100 (A) 12/04/2011 1640   UROBILINOGEN 0.2 10/31/2017 1215   UROBILINOGEN 0.2 12/18/2015 0936   NITRITE Negative 10/31/2017 1215   NITRITE NEGATIVE 12/18/2015 0936   LEUKOCYTESUR Negative 10/31/2017 1215    STUDIES: November mammography and DEXA scan reviewed   ASSESSMENT: 85 y.o. [place] woman with bilateral breast cancers  (1) RIGHT BREAST: Status post right lumpectomy and right axillary lymph node sampling 07/07/2013 for a pT1c pN0, stage IA invasive ductal carcinoma, grade 2, estrogen receptor 100% positive, progesterone receptor 97% positive, with an MIB-1 of 22% and no HER-2 amplification  (a) Oncotype DX score of 0 predicts an outside the breast recurrence of 3% within 10 years if the patient's only systemic treatment is tamoxifen for 5 years  (b) adjuvant radiation completed 08/29/2013  (2) LEFT BREAST: Status post left lumpectomy 07/07/2013 for a pT1b pN0, stage IA invasive ductal carcinoma, grade 2, estrogen receptor 100% positive, progesterone receptor 54% positive, with an MIB-1 of 13% and no HER-2 amplification  (b) Completed adjuvant radiation 08/29/2013  (3) SYSTEMIC THERAPY:   (a) started on anastrozole 09/23/2013   (i) bone density at [facility] 07/12/2014 was normal with a T score of -0.9.   (ii) repeat bone density 03/30/2018 found a normal T score at -0.8   PLAN: Cynthia Garza is now just about 5 years out from definitive surgery for her breast cancer with no evidence of disease recurrence.  This is very favorable.  I offered to let her "graduate" today over the phone but she really wants to come in for that ceremony and I have scheduled her to  see me again in August.  At that point she will go off the anastrozole and we will release her to her primary care physician unless she wishes to be followed here through survivorship  She has done very well from a breast cancer point of view and is taking appropriate pandemic precautions  She knows to call for any other issue that may develop before her next visit here.  , Virgie Dad, MD  09/16/18 11:21 AM Medical Oncology and Hematology [facility] [address] [place], [place] [address] Tel. [phone]    Fax. [phone]  I, Jacqualyn Posey am acting as a Education administrator for Chauncey Cruel, MD.   I, Lurline Del MD, have reviewed the above documentation for accuracy and completeness, and I agree with the above.

## 2018-09-15 NOTE — Telephone Encounter (Signed)
Called patient to reschedule appt per 4/22 sch message - no answer . Left message for patient to call back

## 2018-09-16 ENCOUNTER — Other Ambulatory Visit: Payer: Medicare Other

## 2018-09-16 ENCOUNTER — Inpatient Hospital Stay: Payer: Medicare Other | Attending: Oncology | Admitting: Oncology

## 2018-09-16 DIAGNOSIS — C50411 Malignant neoplasm of upper-outer quadrant of right female breast: Secondary | ICD-10-CM

## 2018-09-16 DIAGNOSIS — Z170 Estrogen receptor positive status [ER+]: Secondary | ICD-10-CM

## 2018-09-20 ENCOUNTER — Telehealth: Payer: Self-pay | Admitting: Oncology

## 2018-09-20 NOTE — Telephone Encounter (Signed)
Called regarding schedule °

## 2018-09-22 ENCOUNTER — Other Ambulatory Visit: Payer: Self-pay | Admitting: Internal Medicine

## 2018-09-25 ENCOUNTER — Other Ambulatory Visit: Payer: Self-pay | Admitting: Oncology

## 2018-10-09 ENCOUNTER — Other Ambulatory Visit: Payer: Self-pay | Admitting: Internal Medicine

## 2018-10-23 DIAGNOSIS — R531 Weakness: Secondary | ICD-10-CM | POA: Diagnosis not present

## 2018-10-23 DIAGNOSIS — G459 Transient cerebral ischemic attack, unspecified: Secondary | ICD-10-CM | POA: Diagnosis not present

## 2018-10-24 ENCOUNTER — Emergency Department (HOSPITAL_COMMUNITY): Payer: Medicare Other

## 2018-10-24 ENCOUNTER — Observation Stay (HOSPITAL_COMMUNITY): Payer: Medicare Other

## 2018-10-24 ENCOUNTER — Other Ambulatory Visit: Payer: Self-pay

## 2018-10-24 ENCOUNTER — Encounter (HOSPITAL_COMMUNITY): Payer: Self-pay | Admitting: Emergency Medicine

## 2018-10-24 ENCOUNTER — Inpatient Hospital Stay (HOSPITAL_COMMUNITY)
Admission: EM | Admit: 2018-10-24 | Discharge: 2018-10-25 | DRG: 065 | Disposition: A | Discharge status: Home / Self Care | Payer: Medicare Other | Attending: Internal Medicine | Admitting: Internal Medicine

## 2018-10-24 DIAGNOSIS — Z79899 Other long term (current) drug therapy: Secondary | ICD-10-CM

## 2018-10-24 DIAGNOSIS — Z170 Estrogen receptor positive status [ER+]: Secondary | ICD-10-CM | POA: Diagnosis not present

## 2018-10-24 DIAGNOSIS — Z800 Family history of malignant neoplasm of digestive organs: Secondary | ICD-10-CM | POA: Diagnosis not present

## 2018-10-24 DIAGNOSIS — E876 Hypokalemia: Secondary | ICD-10-CM | POA: Diagnosis present

## 2018-10-24 DIAGNOSIS — Z8249 Family history of ischemic heart disease and other diseases of the circulatory system: Secondary | ICD-10-CM

## 2018-10-24 DIAGNOSIS — Z8701 Personal history of pneumonia (recurrent): Secondary | ICD-10-CM

## 2018-10-24 DIAGNOSIS — Z1159 Encounter for screening for other viral diseases: Secondary | ICD-10-CM

## 2018-10-24 DIAGNOSIS — F329 Major depressive disorder, single episode, unspecified: Secondary | ICD-10-CM | POA: Diagnosis present

## 2018-10-24 DIAGNOSIS — C50411 Malignant neoplasm of upper-outer quadrant of right female breast: Secondary | ICD-10-CM | POA: Diagnosis not present

## 2018-10-24 DIAGNOSIS — Z79810 Long term (current) use of selective estrogen receptor modulators (SERMs): Secondary | ICD-10-CM | POA: Diagnosis not present

## 2018-10-24 DIAGNOSIS — G8191 Hemiplegia, unspecified affecting right dominant side: Secondary | ICD-10-CM | POA: Diagnosis present

## 2018-10-24 DIAGNOSIS — K219 Gastro-esophageal reflux disease without esophagitis: Secondary | ICD-10-CM | POA: Diagnosis present

## 2018-10-24 DIAGNOSIS — R531 Weakness: Secondary | ICD-10-CM | POA: Diagnosis not present

## 2018-10-24 DIAGNOSIS — Z7982 Long term (current) use of aspirin: Secondary | ICD-10-CM | POA: Diagnosis not present

## 2018-10-24 DIAGNOSIS — G4700 Insomnia, unspecified: Secondary | ICD-10-CM | POA: Diagnosis present

## 2018-10-24 DIAGNOSIS — I639 Cerebral infarction, unspecified: Secondary | ICD-10-CM

## 2018-10-24 DIAGNOSIS — R200 Anesthesia of skin: Secondary | ICD-10-CM | POA: Diagnosis present

## 2018-10-24 DIAGNOSIS — M810 Age-related osteoporosis without current pathological fracture: Secondary | ICD-10-CM | POA: Diagnosis present

## 2018-10-24 DIAGNOSIS — G459 Transient cerebral ischemic attack, unspecified: Secondary | ICD-10-CM

## 2018-10-24 DIAGNOSIS — Z79811 Long term (current) use of aromatase inhibitors: Secondary | ICD-10-CM

## 2018-10-24 DIAGNOSIS — Z888 Allergy status to other drugs, medicaments and biological substances status: Secondary | ICD-10-CM

## 2018-10-24 DIAGNOSIS — E785 Hyperlipidemia, unspecified: Secondary | ICD-10-CM | POA: Diagnosis present

## 2018-10-24 DIAGNOSIS — I6522 Occlusion and stenosis of left carotid artery: Secondary | ICD-10-CM | POA: Diagnosis present

## 2018-10-24 DIAGNOSIS — I69359 Hemiplegia and hemiparesis following cerebral infarction affecting unspecified side: Secondary | ICD-10-CM | POA: Diagnosis present

## 2018-10-24 DIAGNOSIS — I63232 Cerebral infarction due to unspecified occlusion or stenosis of left carotid arteries: Secondary | ICD-10-CM | POA: Diagnosis not present

## 2018-10-24 DIAGNOSIS — I10 Essential (primary) hypertension: Secondary | ICD-10-CM | POA: Diagnosis not present

## 2018-10-24 DIAGNOSIS — Z20828 Contact with and (suspected) exposure to other viral communicable diseases: Secondary | ICD-10-CM | POA: Diagnosis not present

## 2018-10-24 DIAGNOSIS — R202 Paresthesia of skin: Secondary | ICD-10-CM | POA: Diagnosis not present

## 2018-10-24 DIAGNOSIS — Z90710 Acquired absence of both cervix and uterus: Secondary | ICD-10-CM

## 2018-10-24 DIAGNOSIS — F419 Anxiety disorder, unspecified: Secondary | ICD-10-CM | POA: Diagnosis present

## 2018-10-24 LAB — DIFFERENTIAL
Abs Immature Granulocytes: 0.07 10*3/uL (ref 0.00–0.07)
Basophils Absolute: 0.1 10*3/uL (ref 0.0–0.1)
Basophils Relative: 1 %
Eosinophils Absolute: 0.3 10*3/uL (ref 0.0–0.5)
Eosinophils Relative: 3 %
Immature Granulocytes: 1 %
Lymphocytes Relative: 26 %
Lymphs Abs: 2.1 10*3/uL (ref 0.7–4.0)
Monocytes Absolute: 1.1 10*3/uL — ABNORMAL HIGH (ref 0.1–1.0)
Monocytes Relative: 14 %
Neutro Abs: 4.4 10*3/uL (ref 1.7–7.7)
Neutrophils Relative %: 55 %

## 2018-10-24 LAB — COMPREHENSIVE METABOLIC PANEL
ALT: 11 U/L (ref 0–44)
AST: 22 U/L (ref 15–41)
Albumin: 3.9 g/dL (ref 3.5–5.0)
Alkaline Phosphatase: 56 U/L (ref 38–126)
Anion gap: 11 (ref 5–15)
BUN: 20 mg/dL (ref 8–23)
CO2: 26 mmol/L (ref 22–32)
Calcium: 9.7 mg/dL (ref 8.9–10.3)
Chloride: 104 mmol/L (ref 98–111)
Creatinine, Ser: 1.02 mg/dL — ABNORMAL HIGH (ref 0.44–1.00)
GFR calc Af Amer: 59 mL/min — ABNORMAL LOW (ref >60)
GFR calc non Af Amer: 51 mL/min — ABNORMAL LOW (ref >60)
Glucose, Bld: 108 mg/dL — ABNORMAL HIGH (ref 70–99)
Potassium: 3.3 mmol/L — ABNORMAL LOW (ref 3.5–5.1)
Sodium: 141 mmol/L (ref 135–145)
Total Bilirubin: 0.5 mg/dL (ref 0.3–1.2)
Total Protein: 6.4 g/dL — ABNORMAL LOW (ref 6.5–8.1)

## 2018-10-24 LAB — I-STAT CHEM 8, ED
BUN: 22 mg/dL (ref 8–23)
Calcium, Ion: 1.18 mmol/L (ref 1.15–1.40)
Chloride: 102 mmol/L (ref 98–111)
Creatinine, Ser: 1 mg/dL (ref 0.44–1.00)
Glucose, Bld: 105 mg/dL — ABNORMAL HIGH (ref 70–99)
HCT: 38 % (ref 36.0–46.0)
Hemoglobin: 12.9 g/dL (ref 12.0–15.0)
Potassium: 3.3 mmol/L — ABNORMAL LOW (ref 3.5–5.1)
Sodium: 141 mmol/L (ref 135–145)
TCO2: 27 mmol/L (ref 22–32)

## 2018-10-24 LAB — LIPID PANEL
Cholesterol: 206 mg/dL — ABNORMAL HIGH (ref 0–200)
HDL: 55 mg/dL (ref >40)
LDL Cholesterol: 91 mg/dL (ref 0–99)
Total CHOL/HDL Ratio: 3.7 RATIO
Triglycerides: 299 mg/dL — ABNORMAL HIGH (ref <150)
VLDL: 60 mg/dL — ABNORMAL HIGH (ref 0–40)

## 2018-10-24 LAB — CBC
HCT: 40.4 % (ref 36.0–46.0)
Hemoglobin: 13 g/dL (ref 12.0–15.0)
MCH: 27.4 pg (ref 26.0–34.0)
MCHC: 32.2 g/dL (ref 30.0–36.0)
MCV: 85.2 fL (ref 80.0–100.0)
Platelets: 206 10*3/uL (ref 150–400)
RBC: 4.74 MIL/uL (ref 3.87–5.11)
RDW: 13.1 % (ref 11.5–15.5)
WBC: 7.9 10*3/uL (ref 4.0–10.5)
nRBC: 0 % (ref 0.0–0.2)

## 2018-10-24 LAB — APTT: aPTT: 28 seconds (ref 24–36)

## 2018-10-24 LAB — PROTIME-INR
INR: 1 (ref 0.8–1.2)
Prothrombin Time: 13.5 seconds (ref 11.4–15.2)

## 2018-10-24 LAB — ECHOCARDIOGRAM COMPLETE
Height: 61 in
Weight: 2160 oz

## 2018-10-24 LAB — HEMOGLOBIN A1C
Hgb A1c MFr Bld: 5.6 % (ref 4.8–5.6)
Mean Plasma Glucose: 114.02 mg/dL

## 2018-10-24 LAB — SARS CORONAVIRUS 2 BY RT PCR (HOSPITAL ORDER, PERFORMED IN ~~LOC~~ HOSPITAL LAB): SARS Coronavirus 2: NEGATIVE

## 2018-10-24 LAB — TROPONIN I: Troponin I: <0.03 ng/mL (ref <0.03)

## 2018-10-24 MED ORDER — VITAMIN B-12 1000 MCG PO TABS
1000.0000 ug | ORAL_TABLET | Freq: Every day | ORAL | Status: DC
  Administered 2018-10-24 – 2018-10-25 (×2): 1000 ug via ORAL
  Filled 2018-10-24 (×2): qty 1

## 2018-10-24 MED ORDER — ADULT MULTIVITAMIN W/MINERALS CH
1.0000 | ORAL_TABLET | Freq: Every day | ORAL | Status: DC
  Administered 2018-10-24 – 2018-10-25 (×2): 1 via ORAL
  Filled 2018-10-24 (×2): qty 1

## 2018-10-24 MED ORDER — ACETAMINOPHEN 650 MG RE SUPP: 650.0000 mg | RECTAL | Status: DC | PRN

## 2018-10-24 MED ORDER — CLOPIDOGREL BISULFATE 75 MG PO TABS
300.0000 mg | ORAL_TABLET | Freq: Once | ORAL | Status: AC
  Administered 2018-10-24: 04:00:00 300 mg via ORAL
  Filled 2018-10-24: qty 4

## 2018-10-24 MED ORDER — ANASTROZOLE 1 MG PO TABS
1.0000 mg | ORAL_TABLET | Freq: Every day | ORAL | Status: DC
  Administered 2018-10-24 – 2018-10-25 (×2): 1 mg via ORAL
  Filled 2018-10-24 (×2): qty 1

## 2018-10-24 MED ORDER — ASPIRIN EC 81 MG PO TBEC: 81.0000 mg | DELAYED_RELEASE_TABLET | Freq: Every day | ORAL | Status: DC

## 2018-10-24 MED ORDER — POLYVINYL ALCOHOL 1.4 % OP SOLN
2.0000 [drp] | Freq: Every day | OPHTHALMIC | Status: DC
  Administered 2018-10-24: 2 [drp] via OPHTHALMIC
  Filled 2018-10-24: qty 15

## 2018-10-24 MED ORDER — POTASSIUM CHLORIDE CRYS ER 20 MEQ PO TBCR
40.0000 meq | EXTENDED_RELEASE_TABLET | Freq: Once | ORAL | Status: AC
  Administered 2018-10-24: 11:00:00 40 meq via ORAL
  Filled 2018-10-24: qty 2

## 2018-10-24 MED ORDER — ASPIRIN 300 MG RE SUPP: 300.0000 mg | Freq: Every day | RECTAL | Status: DC

## 2018-10-24 MED ORDER — ASPIRIN 325 MG PO TABS: 325.0000 mg | ORAL_TABLET | Freq: Every day | ORAL | Status: DC

## 2018-10-24 MED ORDER — PRAVASTATIN SODIUM 10 MG PO TABS
20.0000 mg | ORAL_TABLET | Freq: Every day | ORAL | Status: DC
  Administered 2018-10-24: 18:00:00 20 mg via ORAL
  Filled 2018-10-24: qty 2

## 2018-10-24 MED ORDER — STROKE: EARLY STAGES OF RECOVERY BOOK
Freq: Once | Status: AC
  Administered 2018-10-24: 04:00:00
  Filled 2018-10-24: qty 1

## 2018-10-24 MED ORDER — ASPIRIN EC 325 MG PO TBEC
325.0000 mg | DELAYED_RELEASE_TABLET | Freq: Every day | ORAL | Status: DC
  Administered 2018-10-24: 325 mg via ORAL
  Filled 2018-10-24: qty 1

## 2018-10-24 MED ORDER — ZOLPIDEM TARTRATE 5 MG PO TABS
5.0000 mg | ORAL_TABLET | Freq: Every evening | ORAL | Status: DC | PRN
  Administered 2018-10-25: 5 mg via ORAL
  Filled 2018-10-24: qty 1

## 2018-10-24 MED ORDER — ENOXAPARIN SODIUM 40 MG/0.4ML ~~LOC~~ SOLN
40.0000 mg | SUBCUTANEOUS | Status: DC
  Administered 2018-10-24 – 2018-10-25 (×2): 40 mg via SUBCUTANEOUS
  Filled 2018-10-24 (×2): qty 0.4

## 2018-10-24 MED ORDER — FAMOTIDINE 20 MG PO TABS
20.0000 mg | ORAL_TABLET | Freq: Two times a day (BID) | ORAL | Status: DC
  Administered 2018-10-24 – 2018-10-25 (×3): 20 mg via ORAL
  Filled 2018-10-24 (×3): qty 1

## 2018-10-24 MED ORDER — CLOPIDOGREL BISULFATE 75 MG PO TABS
75.0000 mg | ORAL_TABLET | Freq: Every day | ORAL | Status: DC
  Administered 2018-10-24 – 2018-10-25 (×2): 75 mg via ORAL
  Filled 2018-10-24 (×2): qty 1

## 2018-10-24 MED ORDER — ACETAMINOPHEN 160 MG/5ML PO SOLN: 650.0000 mg | ORAL | Status: DC | PRN

## 2018-10-24 MED ORDER — BUPROPION HCL ER (XL) 150 MG PO TB24
150.0000 mg | ORAL_TABLET | Freq: Every day | ORAL | Status: DC
  Administered 2018-10-24 – 2018-10-25 (×2): 150 mg via ORAL
  Filled 2018-10-24 (×2): qty 1

## 2018-10-24 MED ORDER — MECLIZINE HCL 25 MG PO TABS
12.5000 mg | ORAL_TABLET | Freq: Two times a day (BID) | ORAL | Status: DC | PRN
  Filled 2018-10-24: qty 0.5

## 2018-10-24 MED ORDER — ASPIRIN EC 325 MG PO TBEC
325.0000 mg | DELAYED_RELEASE_TABLET | Freq: Every day | ORAL | Status: DC
  Administered 2018-10-25: 09:00:00 325 mg via ORAL
  Filled 2018-10-24: qty 1

## 2018-10-24 MED ORDER — ACETAMINOPHEN 325 MG PO TABS: 650.0000 mg | ORAL_TABLET | ORAL | Status: DC | PRN

## 2018-10-24 MED ORDER — POTASSIUM CHLORIDE CRYS ER 20 MEQ PO TBCR
20.0000 meq | EXTENDED_RELEASE_TABLET | Freq: Once | ORAL | Status: AC
  Administered 2018-10-24: 20 meq via ORAL
  Filled 2018-10-24: qty 1

## 2018-10-24 NOTE — Progress Notes (Signed)
  Echocardiogram 2D Echocardiogram has been performed.  Jennette Dubin 10/24/2018, 8:39 AM

## 2018-10-24 NOTE — ED Notes (Signed)
Daughter updated on pt's satus and room number for admission.

## 2018-10-24 NOTE — ED Triage Notes (Signed)
BIB [facility] from home with c/o of sudden right sided arm and leg weakness. Pt states she "could hardly move the right side at all." Episode lasted approx 30 minutes. Per EMS no complaints on their arrival and weakness had subsided.

## 2018-10-24 NOTE — ED Provider Notes (Signed)
[facility] EMERGENCY DEPARTMENT Provider Note   CSN: [id number] Arrival date & time: 10/24/18  0007    History   Chief Complaint Chief Complaint  Patient presents with  . Transient Ischemic Attack    HPI Cynthia Garza is a 85 y.o. female.     The history is provided by the patient.  Extremity Weakness  This is a new problem. The current episode started 1 to 2 hours ago. The problem occurs constantly. The problem has been resolved. Pertinent negatives include no chest pain, no abdominal pain, no headaches and no shortness of breath. Nothing aggravates the symptoms. Nothing relieves the symptoms. She has tried nothing for the symptoms. The treatment provided significant relief.  Patient with HTN presents with sudden onset R U/LE weakness that started at home and lasted approximately 30 minutes and has now resolved.  No f/c/r.  No changes in vision or speech.  No CP no back pain.  No numbness.  No loss of smell or taste.    Past Medical History:  Diagnosis Date  . Anxiety   . Breast cancer ([place]) 2015   ER+/PR+/Her2-  . Depression   . Dyslipidemia   . GERD (gastroesophageal reflux disease)   . Heart murmur   . Hemangioma of intra-abdominal structures   . HTN (hypertension)   . Insomnia   . LBP (low back pain)   . Osteoporosis   . Paresthesia   . Radiation 08/08/13-08/29/13   Bilat.breast 42.72 Gy  . Renal cyst   . Shingles     Patient Active Problem List   Diagnosis Date Noted  . TMJ click 03/19/8526  . CAP (community acquired pneumonia) 08/14/2017  . Epistaxis 01/20/2017  . Anxiety 01/20/2017  . Arthralgia 09/01/2016  . Well adult exam 12/18/2015  . Hypokalemia 06/17/2015  . Malignant neoplasm of upper-outer quadrant of right breast in female, estrogen receptor positive ([place]) 06/02/2013  . Right hip pain 10/22/2012  . Sciatica of right side 10/22/2012  . Memory problem 02/09/2012  . Pyelonephritis 12/04/2011  . Insomnia 11/04/2011  .  Fatigue due to treatment 01/23/2009  . VERTIGO 06/27/2008  . HEMANGIOMA, HEPATIC 03/24/2007  . Dyslipidemia 03/24/2007  . Depression 03/24/2007  . Essential hypertension 03/24/2007  . GASTROESOPHAGEAL REFLUX DISEASE 03/24/2007  . RENAL CYST 03/24/2007  . Osteoporosis 03/24/2007  . SHINGLES, HX OF 03/24/2007  . Acquired absence of genital organ 03/24/2007    Past Surgical History:  Procedure Laterality Date  . ABDOMINAL HYSTERECTOMY     ?1970's  . BREAST LUMPECTOMY WITH NEEDLE LOCALIZATION AND AXILLARY SENTINEL LYMPH NODE BX Bilateral 07/07/2013   Procedure: BREAST LUMPECTOMY WITH NEEDLE LOCALIZATION AND AXILLARY SENTINEL LYMPH NODE BIOPIES;  Surgeon: Merrie Roof, MD;  Location: [facility];  Service: General;  Laterality: Bilateral;     OB History   No obstetric history on file.      Home Medications    Prior to Admission medications   Medication Sig Start Date End Date Taking? Authorizing Provider  amLODipine (NORVASC) 5 MG tablet TAKE 1 TABLET BY MOUTH DAILY. PATIENT NEEDS OFFICE VISIT BEFORE REFILLS WILL BE GIVEN 09/23/18   Plotnikov, Evie Lacks, MD  anastrozole (ARIMIDEX) 1 MG tablet TAKE 1 TABLET BY MOUTH EVERY DAY 09/27/18   Magrinat, Virgie Dad, MD  aspirin EC 81 MG tablet Take 81 mg by mouth at bedtime.    [provider]  buPROPion (WELLBUTRIN XL) 150 MG 24 hr tablet TAKE 1 TABLET BY MOUTH EVERY DAY 12/18/17  Plotnikov, Evie Lacks, MD  ipratropium (ATROVENT) 0.03 % nasal spray PLACE 2 SPRAYS INTO BOTH NOSTRILS 2 (TWO) TIMES DAILY. DO NOT USE FOR MORE THAN 5DAYS. 08/13/17   Nche, Charlene Brooke, NP  meclizine (ANTIVERT) 12.5 MG tablet Take 1 tablet (12.5 mg total) by mouth 2 (two) times daily as needed for dizziness. 03/04/17   Plotnikov, Evie Lacks, MD  Multiple Vitamins-Minerals (ALIVE ONCE DAILY WOMENS 50+) TABS Take 1 tablet by mouth daily.    [provider]  Multiple Vitamins-Minerals (PRESERVISION AREDS 2 PO) Take 1 tablet by mouth 2 (two) times daily.     [provider]  Propylene Glycol (SYSTANE BALANCE) 0.6 % SOLN Place 2 drops into both eyes at bedtime.    [provider]  ranitidine (ZANTAC) 150 MG tablet Take 150 mg by mouth 2 (two) times daily.    [provider]  valsartan-hydrochlorothiazide (DIOVAN-HCT) 320-25 MG tablet TAKE 1 TABLET BY MOUTH DAILY. 10/11/18   Plotnikov, Evie Lacks, MD  vitamin B-12 (CYANOCOBALAMIN) 1000 MCG tablet Take 1,000 mcg by mouth daily.    [provider]  zolpidem (AMBIEN) 10 MG tablet Take 1 tablet (10 mg total) by mouth at bedtime as needed for sleep. 09/09/18   Plotnikov, Evie Lacks, MD    Family History Family History  Problem Relation Age of Onset  . Kidney failure Mother   . Hypertension Father   . Esophageal cancer Father   . Coronary artery disease Neg Hx     Social History Social History   Tobacco Use  . Smoking status: Never Smoker  . Smokeless tobacco: Never Used  Substance Use Topics  . Alcohol use: No  . Drug use: No     Allergies   Cholestoff [plant sterols and stanols]; Lexapro [escitalopram oxalate]; Simvastatin; and Diflucan [fluconazole]   Review of Systems Review of Systems  Constitutional: Negative for fever.  HENT: Negative for sore throat.   Eyes: Negative for visual disturbance.  Respiratory: Negative for cough and shortness of breath.   Cardiovascular: Negative for chest pain.  Gastrointestinal: Negative for abdominal pain.  Musculoskeletal: Positive for extremity weakness.  Neurological: Positive for weakness. Negative for dizziness, seizures, facial asymmetry, speech difficulty, light-headedness, numbness and headaches.  All other systems reviewed and are negative.    Physical Exam Updated Vital Signs BP 137/69   Pulse 71   Temp 97.6 F (36.4 C) (Oral)   Resp (!) 24   Ht '5\' 1"'  (1.549 m)   Wt 61.2 kg   SpO2 96%   BMI 25.51 kg/m   Physical Exam Vitals signs and nursing note reviewed.  Constitutional:      General:  She is not in acute distress.    Appearance: She is normal weight.  HENT:     Head: Normocephalic and atraumatic.     Nose: Nose normal.  Eyes:     Extraocular Movements: Extraocular movements intact.     Conjunctiva/sclera: Conjunctivae normal.     Pupils: Pupils are equal, round, and reactive to light.  Neck:     Musculoskeletal: Normal range of motion and neck supple.  Cardiovascular:     Rate and Rhythm: Normal rate and regular rhythm.     Pulses: Normal pulses.     Heart sounds: Normal heart sounds.  Pulmonary:     Effort: Pulmonary effort is normal.     Breath sounds: Normal breath sounds.  Abdominal:     General: Abdomen is flat. Bowel sounds are normal.  Tenderness: There is no abdominal tenderness.  Musculoskeletal: Normal range of motion.  Skin:    General: Skin is warm and dry.     Capillary Refill: Capillary refill takes less than 2 seconds.  Neurological:     General: No focal deficit present.     Mental Status: She is alert and oriented to person, place, and time.     Cranial Nerves: No cranial nerve deficit.     Deep Tendon Reflexes: Reflexes normal.  Psychiatric:        Mood and Affect: Mood normal.        Behavior: Behavior normal.      ED Treatments / Results  Labs (all labs ordered are listed, but only abnormal results are displayed) Results for orders placed or performed during the hospital encounter of 10/24/18  CBC  Result Value Ref Range   WBC 7.9 4.0 - 10.5 K/uL   RBC 4.74 3.87 - 5.11 MIL/uL   Hemoglobin 13.0 12.0 - 15.0 g/dL   HCT 40.4 36.0 - 46.0 %   MCV 85.2 80.0 - 100.0 fL   MCH 27.4 26.0 - 34.0 pg   MCHC 32.2 30.0 - 36.0 g/dL   RDW 13.1 11.5 - 15.5 %   Platelets 206 150 - 400 K/uL   nRBC 0.0 0.0 - 0.2 %  Differential  Result Value Ref Range   Neutrophils Relative % 55 %   Neutro Abs 4.4 1.7 - 7.7 K/uL   Lymphocytes Relative 26 %   Lymphs Abs 2.1 0.7 - 4.0 K/uL   Monocytes Relative 14 %   Monocytes Absolute 1.1 (H) 0.1 - 1.0  K/uL   Eosinophils Relative 3 %   Eosinophils Absolute 0.3 0.0 - 0.5 K/uL   Basophils Relative 1 %   Basophils Absolute 0.1 0.0 - 0.1 K/uL   Immature Granulocytes 1 %   Abs Immature Granulocytes 0.07 0.00 - 0.07 K/uL  I-stat chem 8, ED ([facility] and [facility] only)  Result Value Ref Range   Sodium 141 135 - 145 mmol/L   Potassium 3.3 (L) 3.5 - 5.1 mmol/L   Chloride 102 98 - 111 mmol/L   BUN 22 8 - 23 mg/dL   Creatinine, Ser 1.00 0.44 - 1.00 mg/dL   Glucose, Bld 105 (H) 70 - 99 mg/dL   Calcium, Ion 1.18 1.15 - 1.40 mmol/L   TCO2 27 22 - 32 mmol/L   Hemoglobin 12.9 12.0 - 15.0 g/dL   HCT 38.0 36.0 - 46.0 %   Ct Head Wo Contrast  Result Date: 10/24/2018 CLINICAL DATA:  Right upper extremity heaviness and lower extremity numbness EXAM: CT HEAD WITHOUT CONTRAST TECHNIQUE: Contiguous axial images were obtained from the base of the skull through the vertex without intravenous contrast. COMPARISON:  None. FINDINGS: Brain: No acute territorial infarction, hemorrhage, or intracranial mass. The ventricles are nonenlarged. Vascular: No hyperdense vessels.  Carotid vascular calcification. Skull: Normal. Negative for fracture or focal lesion. Sinuses/Orbits: No acute finding. Other: Chronic appearing abnormality at the anterior arch of C1, incompletely visualized. Prominent degenerative changes at the C1-C2 articulation. IMPRESSION: Negative non contrasted CT appearance of the brain.  Mild atrophy Electronically Signed   By: Donavan Foil M.D.   On: 10/24/2018 01:00   Dg Chest Portable 1 View  Result Date: 10/24/2018 CLINICAL DATA:  TIA EXAM: PORTABLE CHEST 1 VIEW COMPARISON:  08/14/2017 FINDINGS: The heart size and mediastinal contours are within normal limits. Both lungs are clear. Surgical clips over the axilla bilaterally. Aortic atherosclerosis. IMPRESSION:  No active disease. Electronically Signed   By: Donavan Foil M.D.   On: 10/24/2018 00:43    EKG  EKG Interpretation  Date/Time:  Sunday Oct 24 2018  00:20:25 EDT Ventricular Rate:  73 PR Interval:    QRS Duration: 100 QT Interval:  407 QTC Calculation: 449 R Axis:   8 Text Interpretation:  Sinus rhythm Abnormal R-wave progression, early transition Confirmed by Randal Buba, Elmire Amrein ([id number]) on 10/24/2018 1:05:12 AM       Radiology Dg Chest Portable 1 View  Result Date: 10/24/2018 CLINICAL DATA:  TIA EXAM: PORTABLE CHEST 1 VIEW COMPARISON:  08/14/2017 FINDINGS: The heart size and mediastinal contours are within normal limits. Both lungs are clear. Surgical clips over the axilla bilaterally. Aortic atherosclerosis. IMPRESSION: No active disease. Electronically Signed   By: Donavan Foil M.D.   On: 10/24/2018 00:43    Procedures Procedures (including critical care time)   115 case d/w Dr. Leonel Ramsay, neuro admit for TIA work up  Final Clinical Impressions(s) / ED Diagnoses   Admit for TIA work up    Randal Buba, Mariella Blackwelder, MD 10/24/18 0116

## 2018-10-24 NOTE — ED Notes (Signed)
RN- Patty placed Pt on [place]

## 2018-10-24 NOTE — Progress Notes (Signed)
STROKE TEAM PROGRESS NOTE   SUBJECTIVE (INTERVAL HISTORY) No family is at the bedside.  Pt lying in bed stated that yesterday she had heaviness of right arm but now resolved. She still has right foreleg patchy decreased sensation but that has been going on for a week.   OBJECTIVE Vitals:   10/24/18 0545 10/24/18 0813 10/24/18 1000 10/24/18 1132  BP: (!) 118/57 (!) 134/49 (!) 116/59 (!) 132/55  Pulse: 70 75  72  Resp: 18 16  20   Temp: 98.4 F (36.9 C) 98.1 F (36.7 C) 98 F (36.7 C) 98.2 F (36.8 C)  TempSrc: Oral Oral Oral Oral  SpO2: 96% 99% 97% 98%  Weight:      Height:        CBC:  Recent Labs  Lab 10/24/18 0025 10/24/18 0031  WBC 7.9  --   NEUTROABS 4.4  --   HGB 13.0 12.9  HCT 40.4 38.0  MCV 85.2  --   PLT 206  --     Basic Metabolic Panel:  Recent Labs  Lab 10/24/18 0025 10/24/18 0031  NA 141 141  K 3.3* 3.3*  CL 104 102  CO2 26  --   GLUCOSE 108* 105*  BUN 20 22  CREATININE 1.02* 1.00  CALCIUM 9.7  --     Lipid Panel:     Component Value Date/Time   CHOL 206 (H) 10/24/2018 0025   TRIG 299 (H) 10/24/2018 0025   HDL 55 10/24/2018 0025   CHOLHDL 3.7 10/24/2018 0025   VLDL 60 (H) 10/24/2018 0025   LDLCALC 91 10/24/2018 0025   HgbA1c:  Lab Results  Component Value Date   HGBA1C 5.6 10/24/2018   Urine Drug Screen: No results found for: LABOPIA, COCAINSCRNUR, LABBENZ, AMPHETMU, THCU, LABBARB  Alcohol Level No results found for: ETH  IMAGING  Ct Head Wo Contrast 10/24/2018 IMPRESSION:  Negative non contrasted CT appearance of the brain. Mild atrophy.  Mr Brain Wo Contrast 10/24/2018 IMPRESSION:   MRI HEAD: 1. 7 mm acute ischemic nonhemorrhagic infarct involving the deep white matter of the mid-posterior left centrum semi ovale.  2. Otherwise normal brain MRI for age.   MRA HEAD: 1. Negative intracranial MRA for large vessel occlusion.  2. Short-segment severe cavernous left ICA stenosis.  3. Otherwise negative intracranial MRA. No  other hemodynamically significant or correctable stenosis.   Dg Chest Portable 1 View 10/24/2018 IMPRESSION:  No active disease.   Vas US Carotid (at [facility] Only) 10/24/2018 Summary:  Right Carotid: Velocities in the right ICA are consistent with a 1-39% stenosis.  Left Carotid: Velocities in the left ICA are consistent with a 1-39% stenosis.  Vertebrals: Bilateral vertebral arteries demonstrate antegrade flow.    Transthoracic Echocardiogram  10/24/2018 IMPRESSIONS  1. The left ventricle has hyperdynamic systolic function, with an ejection fraction of >65%. The cavity size was normal. Left ventricular diastolic Doppler parameters are consistent with impaired relaxation.  2. The right ventricle has normal systolic function. The cavity was normal. There is no increase in right ventricular wall thickness.  3. Left atrial size was mildly dilated.  4. The mitral valve is abnormal. Mild thickening of the mitral valve leaflet. There is moderate mitral annular calcification present.  5. The aortic valve is tricuspid. Mild thickening of the aortic valve. Moderate calcification of the aortic valve.  6. The interatrial septum was not assessed.   EKG - SR rate 73 BPM. (See cardiology reading for complete details)   PHYSICAL EXAM  Temp:  [97.6 F (36.4 C)-98.4 F (36.9 C)] 98.2 F (36.8 C) (05/31 1132) Pulse Rate:  [67-75] 72 (05/31 1132) Resp:  [12-24] 20 (05/31 1132) BP: (116-137)/(49-69) 132/55 (05/31 1132) SpO2:  [96 %-99 %] 98 % (05/31 1132) Weight:  [61.2 kg] 61.2 kg (05/31 0015)  General - Well nourished, well developed, in no apparent distress.  Ophthalmologic - fundi not visualized due to noncooperation.  Cardiovascular - Regular rate and rhythm.  Mental Status -  Level of arousal and orientation to time, place, and person were intact. Language including expression, naming, repetition, comprehension was assessed and found intact. Fund of Knowledge was assessed and was  intact.  Cranial Nerves II - XII - II - Visual field intact OU. III, IV, VI - Extraocular movements intact. V - Facial sensation intact bilaterally. VII - Facial movement intact bilaterally. VIII - Hearing & vestibular intact bilaterally. X - Palate elevates symmetrically. XI - Chin turning & shoulder shrug intact bilaterally. XII - Tongue protrusion intact.  Motor Strength - The patient's strength was normal in all extremities and pronator drift was absent.  Bulk was normal and fasciculations were absent.   Motor Tone - Muscle tone was assessed at the neck and appendages and was normal.  Reflexes - The patient's reflexes were symmetrical in all extremities and she had no pathological reflexes.  Sensory - Light touch, temperature/pinprick were assessed and were symmetrical.    Coordination - The patient had normal movements in the hands and feet with no ataxia or dysmetria.  Tremor was absent.  Gait and Station - deferred.   ASSESSMENT/PLAN Ms. KINSLIE HOVE is a 85 y.o. female with history of hyperlipidemia, anxiety and hypertension who presents with right-sided weakness that gradually improved.. She did not receive IV t-PA due to mild deficits.   Stroke: deep white matter of the mid-posterior left centrum semi ovale - possibly large vessel disease due to left cavernous carotid stenosis  Resultant back to baseline  CT head - Negative non contrasted CT appearance of the brain.   MRI head - small acute infarct involving the deep WM of the mid-posterior left centrum semi ovale.   MRA head - Short-segment severe cavernous left ICA stenosis.  Carotid Doppler unremarkable  2D Echo  - EF > 65%. No cardiac source of emboli identified.   Sars Corona Virus 2 - negative  LDL - 91  HgbA1c - 5.6  VTE prophylaxis - Lovenox  Diet  - Heart healthy with thin liquids.  aspirin 81 mg daily prior to admission, now on aspirin 325 mg daily and clopidogrel 75 mg daily. Continue ASA  325mg  and plavix 75 for 3 months and then plavix alone given large intracranial vessel high grade stenosis  Patient counseled to be compliant with her antithrombotic medications  Ongoing aggressive stroke risk factor management  Therapy recommendations: none  Disposition:  Pending  Cavernous carotid artery stenosis  MRA showed left cavernous carotid high grade stenosis  Likely the cause of stroke  On DAPT for 3 months and then plavix alone  Avoid low BP  Hypertension  Stable . Permissive hypertension (OK if < 220/120) but gradually normalize in 3-5 days . Long-term BP goal normotensive  Hyperlipidemia  Lipid lowering medication PTA:  none  LDL 91, goal < 70  Current lipid lowering medication: Pravachol 20 mg daily  Continue statin at discharge  Other Stroke Risk Factors  Advanced age  Other Active Problems  Hypokalemia - 3.3 -> supplemented   Hospital day #  0  Neurology will sign off. Please call with questions. Pt will follow up with stroke clinic NP at [facility] in about 4 weeks. Thanks for the consult.  Rosalin Hawking, MD PhD Stroke Neurology 10/24/2018 2:07 PM  To contact Stroke Continuity provider, please refer to [email or web address] After hours, contact General Neurology

## 2018-10-24 NOTE — Consult Note (Signed)
Neurology Consultation Reason for Consult: Right-sided weakness Referring Physician: Sheran Luz  CC: Right-sided weakness  History is obtained from: Patient  HPI: Cynthia Garza is a 85 y.o. female with a history of hyperlipidemia who presents with right-sided weakness that started around 10 PM.  She states that it lasted about 30 to 45 minutes and gradually got better.  Initially she was not able to move it much at all, and due to concerns for stroke she came to the emergency department.  At the time of my interview she had almost completely resolved with only a little bit of numbness around her right foot that was consistent.   LKW: 10 PM tpa given?: no, mild symptoms   ROS: A 14 point ROS was performed and is negative except as noted in the HPI.  Past Medical History:  Diagnosis Date  . Anxiety   . Breast cancer ([place]) 2015   ER+/PR+/Her2-  . Depression   . Dyslipidemia   . GERD (gastroesophageal reflux disease)   . Heart murmur   . Hemangioma of intra-abdominal structures   . HTN (hypertension)   . Insomnia   . LBP (low back pain)   . Osteoporosis   . Paresthesia   . Radiation 08/08/13-08/29/13   Bilat.breast 42.72 Gy  . Renal cyst   . Shingles      Family History  Problem Relation Age of Onset  . Kidney failure Mother   . Hypertension Father   . Esophageal cancer Father   . Coronary artery disease Neg Hx      Social History:  reports that she has never smoked. She has never used smokeless tobacco. She reports that she does not drink alcohol or use drugs.   Exam: Current vital signs: BP 128/62 (BP Location: Left Arm)   Pulse 67   Temp 98.2 F (36.8 C) (Oral)   Resp 12   Ht 5' 1" (1.549 m)   Wt 61.2 kg   SpO2 97%   BMI 25.51 kg/m  Vital signs in last 24 hours: Temp:  [97.6 F (36.4 C)-98.2 F (36.8 C)] 98.2 F (36.8 C) (05/31 0352) Pulse Rate:  [67-71] 67 (05/31 0352) Resp:  [12-24] 12 (05/31 0352) BP: (128-137)/(62-69) 128/62 (05/31  0352) SpO2:  [96 %-98 %] 97 % (05/31 0352) Weight:  [61.2 kg] 61.2 kg (05/31 0015)   Physical Exam  Constitutional: Appears well-developed and well-nourished.  Psych: Affect appropriate to situation Eyes: No scleral injection HENT: No OP obstrucion Head: Normocephalic.  Cardiovascular: Normal rate and regular rhythm.  Respiratory: Effort normal, non-labored breathing GI: Soft.  No distension. There is no tenderness.  Skin: WDI  Neuro: Mental Status: Patient is awake, alert, oriented to person, place, month, year, and situation. Patient is able to give a clear and coherent history. No signs of aphasia or neglect Cranial Nerves: II: Visual Fields are full. Pupils are equal, round, and reactive to light.   III,IV, VI: EOMI without ptosis or diploplia.  V: Facial sensation is symmetric to temperature VII: Facial movement is symmetric.  VIII: hearing is intact to voice X: Uvula elevates symmetrically XI: Shoulder shrug is symmetric. XII: tongue is midline without atrophy or fasciculations.  Motor: Tone is normal. Bulk is normal. 5/5 strength was present in bilateral upper extremities and left lower extremity, 5-/5 in the right lower extremity Sensory: Sensation is symmetric to light touch and temperature in the arms and legs with the exception of mild decreased light touch around the right ankle Cerebellar: FNF  intact bilaterally   I have reviewed labs in epic and the results pertinent to this consultation are: mild hypokalemia at 3.3 CBC-normal  I have reviewed the images obtained: CT head-unremarkable  Impression: 85 year old female with transient right-sided numbness and weakness most consistent with TIA(though with small residual symptoms, suspect small infarct).  She will be admitted for secondary risk factor modification.  Recommendations: - HgbA1c, fasting lipid panel - MRI, MRA  of the brain without contrast - Frequent neuro checks - Echocardiogram - Carotid  dopplers - Prophylactic therapy-Antiplatelet med: Aspirin 81 mg daily and Plavix 75 mg daily following 300 mg load for 3 weeks followed by monotherapy - Risk factor modification - Telemetry monitoring - PT consult, OT consult, Speech consult - Stroke team to follow    Roland Rack, MD [facility] [phone]  If 7pm- 7am, please page neurology on call as listed in [place].

## 2018-10-24 NOTE — H&P (Signed)
History and Physical    Cynthia Garza XAJ:[id number] DOB: 22-Apr-1933 DOA: 10/24/2018  PCP: Cassandria Anger, MD  Patient coming from: Home  I have personally briefly reviewed patient's old medical records in [place]  Chief Complaint: R sided weakness  HPI: [name] is a 85 y.o. female with medical history significant of HTN, BRCA s/p lumpectomy and on tamoxifen.  Patient presents to the ED after episode of R sided weakness.  Symptoms onset at around 10pm.  She had about 30 min episode of RUE, RLE weakness.  No changes in vision or speech, no CP no back pain.  Symptoms improved significantly by time she got to the ED at [facility].   ED Course: Patient still with some "tingling, feels like its asleep" and weakness in her RLE though symptoms are generally improved, UE symptoms seem to be resolved.  CT head neg.   Review of Systems: As per HPI otherwise 10 point review of systems negative.   Past Medical History:  Diagnosis Date  . Anxiety   . Breast cancer ([place]) 2015   ER+/PR+/Her2-  . Depression   . Dyslipidemia   . GERD (gastroesophageal reflux disease)   . Heart murmur   . Hemangioma of intra-abdominal structures   . HTN (hypertension)   . Insomnia   . LBP (low back pain)   . Osteoporosis   . Paresthesia   . Radiation 08/08/13-08/29/13   Bilat.breast 42.72 Gy  . Renal cyst   . Shingles     Past Surgical History:  Procedure Laterality Date  . ABDOMINAL HYSTERECTOMY     ?1970's  . BREAST LUMPECTOMY WITH NEEDLE LOCALIZATION AND AXILLARY SENTINEL LYMPH NODE BX Bilateral 07/07/2013   Procedure: BREAST LUMPECTOMY WITH NEEDLE LOCALIZATION AND AXILLARY SENTINEL LYMPH NODE BIOPIES;  Surgeon: Merrie Roof, MD;  Location: [facility];  Service: General;  Laterality: Bilateral;     reports that she has never smoked. She has never used smokeless tobacco. She reports that she does not drink alcohol or use drugs.  Allergies  Allergen Reactions  . Cholestoff  [Plant Sterols And Stanols]     unknown  . Lexapro [Escitalopram Oxalate]     fatigue  . Simvastatin Other (See Comments)    aches  . Diflucan [Fluconazole] Rash    Family History  Problem Relation Age of Onset  . Kidney failure Mother   . Hypertension Father   . Esophageal cancer Father   . Coronary artery disease Neg Hx      Prior to Admission medications   Medication Sig Start Date End Date Taking? Authorizing Provider  amLODipine (NORVASC) 5 MG tablet TAKE 1 TABLET BY MOUTH DAILY. PATIENT NEEDS OFFICE VISIT BEFORE REFILLS WILL BE GIVEN Patient taking differently: Take 5 mg by mouth daily.  09/23/18  Yes Plotnikov, Evie Lacks, MD  anastrozole (ARIMIDEX) 1 MG tablet TAKE 1 TABLET BY MOUTH EVERY DAY Patient taking differently: Take 1 mg by mouth daily.  09/27/18  Yes Magrinat, Virgie Dad, MD  aspirin EC 81 MG tablet Take 81 mg by mouth at bedtime.   Yes [provider]  buPROPion (WELLBUTRIN XL) 150 MG 24 hr tablet TAKE 1 TABLET BY MOUTH EVERY DAY Patient taking differently: Take 150 mg by mouth daily.  12/18/17  Yes Plotnikov, Evie Lacks, MD  ipratropium (ATROVENT) 0.03 % nasal spray PLACE 2 SPRAYS INTO BOTH NOSTRILS 2 (TWO) TIMES DAILY. DO NOT USE FOR MORE THAN 5DAYS. Patient taking differently: Place 2 sprays into  both nostrils 2 (two) times daily.  08/13/17  Yes Nche, Charlene Brooke, NP  meclizine (ANTIVERT) 12.5 MG tablet Take 1 tablet (12.5 mg total) by mouth 2 (two) times daily as needed for dizziness. 03/04/17  Yes Plotnikov, Evie Lacks, MD  Multiple Vitamins-Minerals (ALIVE ONCE DAILY WOMENS 50+) TABS Take 1 tablet by mouth daily.   Yes [provider]  Multiple Vitamins-Minerals (PRESERVISION AREDS 2 PO) Take 1 tablet by mouth 2 (two) times daily.   Yes [provider]  Propylene Glycol (SYSTANE BALANCE) 0.6 % SOLN Place 2 drops into both eyes at bedtime.   Yes [provider]  ranitidine (ZANTAC) 150 MG tablet Take 150 mg by mouth 2 (two) times  daily.   Yes [provider]  valsartan-hydrochlorothiazide (DIOVAN-HCT) 320-25 MG tablet TAKE 1 TABLET BY MOUTH DAILY. 10/11/18  Yes Plotnikov, Evie Lacks, MD  vitamin B-12 (CYANOCOBALAMIN) 1000 MCG tablet Take 1,000 mcg by mouth daily.   Yes [provider]  zolpidem (AMBIEN) 10 MG tablet Take 1 tablet (10 mg total) by mouth at bedtime as needed for sleep. 09/09/18  Yes Plotnikov, Evie Lacks, MD    Physical Exam: Vitals:   10/24/18 0011 10/24/18 0015 10/24/18 0018 10/24/18 0030  BP:   137/64 137/69  Pulse:   69 71  Resp:   16 (!) 24  Temp:   97.6 F (36.4 C)   TempSrc:   Oral   SpO2: 97%  98% 96%  Weight:  61.2 kg    Height:  _0  (1.549 m)      Constitutional: NAD, calm, comfortable Eyes: PERRL, lids and conjunctivae normal ENMT: Mucous membranes are moist. Posterior pharynx clear of any exudate or lesions.Normal dentition.  Neck: normal, supple, no masses, no thyromegaly Respiratory: clear to auscultation bilaterally, no wheezing, no crackles. Normal respiratory effort. No accessory muscle use.  Cardiovascular: Regular rate and rhythm, no murmurs / rubs / gallops. No extremity edema. 2+ pedal pulses. No carotid bruits.  Abdomen: no tenderness, no masses palpated. No hepatosplenomegaly. Bowel sounds positive.  Musculoskeletal: no clubbing / cyanosis. No joint deformity upper and lower extremities. Good ROM, no contractures. Normal muscle tone.  Skin: no rashes, lesions, ulcers. No induration Neurologic: No facial droop, tongue protrusion midline.  Does have 4/5 weakness in hip flexor of RLE, 5/5 in L.  Distal muscles seem intact bilaterally.  UE strength also seems preserved bilaterally.  Speech clear without aphasia or slurring. Psychiatric: Normal judgment and insight. Alert and oriented x 3. Normal mood.    Labs on Admission: I have personally reviewed following labs and imaging studies  CBC: Recent Labs  Lab 10/24/18 0025 10/24/18 0031  WBC 7.9  --    NEUTROABS 4.4  --   HGB 13.0 12.9  HCT 40.4 38.0  MCV 85.2  --   PLT 206  --    Basic Metabolic Panel: Recent Labs  Lab 10/24/18 0025 10/24/18 0031  NA 141 141  K 3.3* 3.3*  CL 104 102  CO2 26  --   GLUCOSE 108* 105*  BUN 20 22  CREATININE 1.02* 1.00  CALCIUM 9.7  --    GFR: Estimated Creatinine Clearance: 35.8 mL/min (by C-G formula based on SCr of 1 mg/dL). Liver Function Tests: Recent Labs  Lab 10/24/18 0025  AST 22  ALT 11  ALKPHOS 56  BILITOT 0.5  PROT 6.4*  ALBUMIN 3.9   No results for input(s): LIPASE, AMYLASE in the last 168 hours. No results for input(s): AMMONIA  in the last 168 hours. Coagulation Profile: Recent Labs  Lab 10/24/18 0025  INR 1.0   Cardiac Enzymes: Recent Labs  Lab 10/24/18 0025  TROPONINI <0.03   BNP (last 3 results) No results for input(s): PROBNP in the last 8760 hours. HbA1C: No results for input(s): HGBA1C in the last 72 hours. CBG: No results for input(s): GLUCAP in the last 168 hours. Lipid Profile: No results for input(s): CHOL, HDL, LDLCALC, TRIG, CHOLHDL, LDLDIRECT in the last 72 hours. Thyroid Function Tests: No results for input(s): TSH, T4TOTAL, FREET4, T3FREE, THYROIDAB in the last 72 hours. Anemia Panel: No results for input(s): VITAMINB12, FOLATE, FERRITIN, TIBC, IRON, RETICCTPCT in the last 72 hours. Urine analysis:    Component Value Date/Time   COLORURINE YELLOW 12/18/2015 0936   APPEARANCEUR CLEAR 12/18/2015 0936   LABSPEC 1.010 12/18/2015 0936   PHURINE 7.0 12/18/2015 0936   GLUCOSEU NEGATIVE 12/18/2015 0936   HGBUR NEGATIVE 12/18/2015 0936   BILIRUBINUR negative 10/31/2017 1215   BILIRUBINUR neg 12/03/2011 1540   KETONESUR negative 10/31/2017 1215   KETONESUR NEGATIVE 12/18/2015 0936   PROTEINUR negative 10/31/2017 1215   PROTEINUR 100 (A) 12/04/2011 1640   UROBILINOGEN 0.2 10/31/2017 1215   UROBILINOGEN 0.2 12/18/2015 0936   NITRITE Negative 10/31/2017 1215   NITRITE NEGATIVE 12/18/2015 0936    LEUKOCYTESUR Negative 10/31/2017 1215    Radiological Exams on Admission: Ct Head Wo Contrast  Result Date: 10/24/2018 CLINICAL DATA:  Right upper extremity heaviness and lower extremity numbness EXAM: CT HEAD WITHOUT CONTRAST TECHNIQUE: Contiguous axial images were obtained from the base of the skull through the vertex without intravenous contrast. COMPARISON:  None. FINDINGS: Brain: No acute territorial infarction, hemorrhage, or intracranial mass. The ventricles are nonenlarged. Vascular: No hyperdense vessels.  Carotid vascular calcification. Skull: Normal. Negative for fracture or focal lesion. Sinuses/Orbits: No acute finding. Other: Chronic appearing abnormality at the anterior arch of C1, incompletely visualized. Prominent degenerative changes at the C1-C2 articulation. IMPRESSION: Negative non contrasted CT appearance of the brain.  Mild atrophy Electronically Signed   By: Donavan Foil M.D.   On: 10/24/2018 01:00   Dg Chest Portable 1 View  Result Date: 10/24/2018 CLINICAL DATA:  TIA EXAM: PORTABLE CHEST 1 VIEW COMPARISON:  08/14/2017 FINDINGS: The heart size and mediastinal contours are within normal limits. Both lungs are clear. Surgical clips over the axilla bilaterally. Aortic atherosclerosis. IMPRESSION: No active disease. Electronically Signed   By: Donavan Foil M.D.   On: 10/24/2018 00:43    EKG: Independently reviewed.  Assessment/Plan Principal Problem:   Right sided weakness Active Problems:   Essential hypertension   Malignant neoplasm of upper-outer quadrant of right breast in female, estrogen receptor positive ([place])    1. R sided weakness - suspect acute ischemic stroke with ongoing symptoms in R leg 1. Notified Dr. Leonel Ramsay of admission and ongoing symptoms, though doesn't sound like shes going to need / warrant intervention at this point. 2. Stroke pathway 3. PT/OT/SLP 4. Increase ASA to 325 for now (was on 81) 5. MRI/MRA 6. 2d echo 7. Carotid dopplers 8.  Tele monitor 9. A1C, FLP 1. May need to start statin depending on results 2. HTN - 1. Hold home HTN meds and allow permissive HTN 3. BRCA - continue tamoxifen  DVT prophylaxis: Lovenox Code Status: Full Family Communication: No family in room Disposition Plan: Home after admit Consults called: Neuro Admission status: Place in obs - likely to convert to IP depending on MRI results    GARDNER, JARED  M. DO [facility]  How to contact the [facility] Attending or Consulting provider [place] or covering provider during after hours [place], for this patient?  1. Check the care team in [facility] and look for a) attending/consulting [facility] provider listed and b) the [facility] team listed 2. Log into [email or web address]  Amion Physician Scheduling and messaging for groups and whole hospitals  On call and physician scheduling software for group practices, residents, hospitalists and other medical providers for call, clinic, rotation and shift schedules. OnCall Enterprise is a hospital-wide system for scheduling doctors and paging doctors on call. EasyPlot is for scientific plotting and data analysis.  [email or web address]  and use [place]'s universal password to access. If you do not have the password, please contact the hospital operator.  3. Locate the [facility] provider you are looking for under [facility] and page to a number that you can be directly reached. 4. If you still have difficulty reaching the provider, please page the [facility] (Director on Call) for the Hospitalists listed on amion for assistance.  10/24/2018, 3:05 AM

## 2018-10-24 NOTE — ED Notes (Signed)
ED TO INPATIENT HANDOFF REPORT  ED Nurse Name and Phone #: Ben [phone]  S Name/Age/Gender Cynthia Garza 85 y.o. female Room/Bed: 015C/015C  Code Status   Code Status: Prior  Home/SNF/Other Home Patient oriented to: self, place, time and situation Is this baseline? Yes   Triage Complete: Triage complete  Chief Complaint TIA  Triage Note BIB [facility] from home with c/o of sudden right sided arm and leg weakness. Pt states she "could hardly move the right side at all." Episode lasted approx 30 minutes. Per EMS no complaints on their arrival and weakness had subsided.    Allergies Allergies  Allergen Reactions  . Cholestoff [Plant Sterols And Stanols]     unknown  . Lexapro [Escitalopram Oxalate]     fatigue  . Simvastatin Other (See Comments)    aches  . Diflucan [Fluconazole] Rash    Level of Care/Admitting Diagnosis ED Disposition    ED Disposition Condition Comment   Admit  The patient appears reasonably stabilized for admission considering the current resources, flow, and capabilities available in the ED at this time, and I doubt any other [facility] requiring further screening and/or treatment in the ED prior to admission is  present.       B Medical/Surgery History Past Medical History:  Diagnosis Date  . Anxiety   . Breast cancer ([place]) 2015   ER+/PR+/Her2-  . Depression   . Dyslipidemia   . GERD (gastroesophageal reflux disease)   . Heart murmur   . Hemangioma of intra-abdominal structures   . HTN (hypertension)   . Insomnia   . LBP (low back pain)   . Osteoporosis   . Paresthesia   . Radiation 08/08/13-08/29/13   Bilat.breast 42.72 Gy  . Renal cyst   . Shingles    Past Surgical History:  Procedure Laterality Date  . ABDOMINAL HYSTERECTOMY     ?1970's  . BREAST LUMPECTOMY WITH NEEDLE LOCALIZATION AND AXILLARY SENTINEL LYMPH NODE BX Bilateral 07/07/2013   Procedure: BREAST LUMPECTOMY WITH NEEDLE LOCALIZATION AND AXILLARY SENTINEL LYMPH NODE BIOPIES;   Surgeon: Luella Cook III, MD;  Location: [facility];  Service: General;  Laterality: Bilateral;     A IV Location/Drains/Wounds Patient Lines/Drains/Airways Status   Active Line/Drains/Airways    Name:   Placement date:   Placement time:   Site:   Days:   Peripheral IV 10/24/18 Left Forearm   10/24/18    0053    Forearm   less than 1   Incision (Closed) 07/07/13 Breast Bilateral   07/07/13    1258     1933          Intake/Output Last 24 hours No intake or output data in the 24 hours ending 10/24/18 0147  Labs/Imaging Results for orders placed or performed during the hospital encounter of 10/24/18 (from the past 48 hour(s))  Protime-INR     Status: None   Collection Time: 10/24/18 12:25 AM  Result Value Ref Range   Prothrombin Time 13.5 11.4 - 15.2 seconds   INR 1.0 0.8 - 1.2    Comment: (NOTE) INR goal varies based on device and disease states. Performed at [facility], [place] [address] [place], [place] [address]   APTT     Status: None   Collection Time: 10/24/18 12:25 AM  Result Value Ref Range   aPTT 28 24 - 36 seconds    Comment: Performed at [facility] [address], [place], [place] [address]  CBC  Status: None   Collection Time: 10/24/18 12:25 AM  Result Value Ref Range   WBC 7.9 4.0 - 10.5 K/uL   RBC 4.74 3.87 - 5.11 MIL/uL   Hemoglobin 13.0 12.0 - 15.0 g/dL   HCT 40.4 36.0 - 46.0 %   MCV 85.2 80.0 - 100.0 fL   MCH 27.4 26.0 - 34.0 pg   MCHC 32.2 30.0 - 36.0 g/dL   RDW 13.1 11.5 - 15.5 %   Platelets 206 150 - 400 K/uL   nRBC 0.0 0.0 - 0.2 %    Comment: Performed at [facility], [place] [address], [place], [place] [address]  Differential     Status: Abnormal   Collection Time: 10/24/18 12:25 AM  Result Value Ref Range   Neutrophils Relative % 55 %   Neutro Abs 4.4 1.7 - 7.7 K/uL   Lymphocytes Relative 26 %   Lymphs Abs 2.1 0.7 - 4.0 K/uL   Monocytes Relative 14 %   Monocytes Absolute 1.1 (H) 0.1 - 1.0 K/uL   Eosinophils Relative 3 %    Eosinophils Absolute 0.3 0.0 - 0.5 K/uL   Basophils Relative 1 %   Basophils Absolute 0.1 0.0 - 0.1 K/uL   Immature Granulocytes 1 %   Abs Immature Granulocytes 0.07 0.00 - 0.07 K/uL    Comment: Performed at [facility], [place] [address] [place], [place] [address]  Comprehensive metabolic panel     Status: Abnormal   Collection Time: 10/24/18 12:25 AM  Result Value Ref Range   Sodium 141 135 - 145 mmol/L   Potassium 3.3 (L) 3.5 - 5.1 mmol/L   Chloride 104 98 - 111 mmol/L   CO2 26 22 - 32 mmol/L   Glucose, Bld 108 (H) 70 - 99 mg/dL   BUN 20 8 - 23 mg/dL   Creatinine, Ser 1.02 (H) 0.44 - 1.00 mg/dL   Calcium 9.7 8.9 - 10.3 mg/dL   Total Protein 6.4 (L) 6.5 - 8.1 g/dL   Albumin 3.9 3.5 - 5.0 g/dL   AST 22 15 - 41 U/L   ALT 11 0 - 44 U/L   Alkaline Phosphatase 56 38 - 126 U/L   Total Bilirubin 0.5 0.3 - 1.2 mg/dL   GFR calc non Af Amer 51 (L) >60 mL/min   GFR calc Af Amer 59 (L) >60 mL/min   Anion gap 11 5 - 15    Comment: Performed at [facility] [address], [place], [place] [address]  Troponin I - ONCE - STAT     Status: None   Collection Time: 10/24/18 12:25 AM  Result Value Ref Range   Troponin I <0.03 <0.03 ng/mL    Comment: Performed at [facility] [address], [place], [place] [address]  I-stat chem 8, ED [facility] only)     Status: Abnormal   Collection Time: 10/24/18 12:31 AM  Result Value Ref Range   Sodium 141 135 - 145 mmol/L   Potassium 3.3 (L) 3.5 - 5.1 mmol/L   Chloride 102 98 - 111 mmol/L   BUN 22 8 - 23 mg/dL   Creatinine, Ser 1.00 0.44 - 1.00 mg/dL   Glucose, Bld 105 (H) 70 - 99 mg/dL   Calcium, Ion 1.18 1.15 - 1.40 mmol/L   TCO2 27 22 - 32 mmol/L   Hemoglobin 12.9 12.0 - 15.0 g/dL   HCT 38.0 36.0 - 46.0 %  SARS Coronavirus 2 (CEPHEID - Performed in  [facility] hospital lab), Hosp Order     Status: None   Collection Time: 10/24/18 12:34 AM  Result Value Ref Range   SARS Coronavirus 2 NEGATIVE NEGATIVE    Comment:  (NOTE) If result is NEGATIVE SARS-CoV-2 target nucleic acids are NOT DETECTED. The SARS-CoV-2 RNA is generally detectable in upper and lower  respiratory specimens during the acute phase of infection. The lowest  concentration of SARS-CoV-2 viral copies this assay can detect is 250  copies / mL. A negative result does not preclude SARS-CoV-2 infection  and should not be used as the sole basis for treatment or other  patient management decisions.  A negative result may occur with  improper specimen collection / handling, submission of specimen other  than nasopharyngeal swab, presence of viral mutation(s) within the  areas targeted by this assay, and inadequate number of viral copies  (<250 copies / mL). A negative result must be combined with clinical  observations, patient history, and epidemiological information. If result is POSITIVE SARS-CoV-2 target nucleic acids are DETECTED. The SARS-CoV-2 RNA is generally detectable in upper and lower  respiratory specimens dur ing the acute phase of infection.  Positive  results are indicative of active infection with SARS-CoV-2.  Clinical  correlation with patient history and other diagnostic information is  necessary to determine patient infection status.  Positive results do  not rule out bacterial infection or co-infection with other viruses. If result is PRESUMPTIVE POSTIVE SARS-CoV-2 nucleic acids MAY BE PRESENT.   A presumptive positive result was obtained on the submitted specimen  and confirmed on repeat testing.  While 2019 novel coronavirus  (SARS-CoV-2) nucleic acids may be present in the submitted sample  additional confirmatory testing may be necessary for epidemiological  and / or clinical management purposes  to differentiate between  SARS-CoV-2 and other Sarbecovirus currently known to infect humans.  If clinically indicated additional testing with an alternate test  methodology [phone]) is advised. The SARS-CoV-2 RNA is  generally  detectable in upper and lower respiratory sp ecimens during the acute  phase of infection. The expected result is Negative. Fact Sheet for Patients:  StrictlyIdeas.no Fact Sheet for Healthcare Providers: BankingDealers.co.za This test is not yet approved or cleared by the [place] FDA and has been authorized for detection and/or diagnosis of SARS-CoV-2 by FDA under an Emergency Use Authorization (EUA).  This EUA will remain in effect (meaning this test can be used) for the duration of the COVID-19 declaration under Section 564(b)(1) of the Act, 21 U.S.C. section 360bbb-3(b)(1), unless the authorization is terminated or revoked sooner. Performed at [facility], [place] [address], [place], [place] [address]    Ct Head Wo Contrast  Result Date: 10/24/2018 CLINICAL DATA:  Right upper extremity heaviness and lower extremity numbness EXAM: CT HEAD WITHOUT CONTRAST TECHNIQUE: Contiguous axial images were obtained from the base of the skull through the vertex without intravenous contrast. COMPARISON:  None. FINDINGS: Brain: No acute territorial infarction, hemorrhage, or intracranial mass. The ventricles are nonenlarged. Vascular: No hyperdense vessels.  Carotid vascular calcification. Skull: Normal. Negative for fracture or focal lesion. Sinuses/Orbits: No acute finding. Other: Chronic appearing abnormality at the anterior arch of C1, incompletely visualized. Prominent degenerative changes at the C1-C2 articulation. IMPRESSION: Negative non contrasted CT appearance of the brain.  Mild atrophy Electronically Signed   By: Donavan Foil M.D.   On: 10/24/2018 01:00   Dg Chest Portable 1 View  Result Date: 10/24/2018 CLINICAL DATA:  TIA EXAM: PORTABLE  CHEST 1 VIEW COMPARISON:  08/14/2017 FINDINGS: The heart size and mediastinal contours are within normal limits. Both lungs are clear. Surgical clips over the axilla bilaterally. Aortic  atherosclerosis. IMPRESSION: No active disease. Electronically Signed   By: Donavan Foil M.D.   On: 10/24/2018 00:43    Pending Labs Unresulted Labs (From admission, onward)    Start     Ordered   10/24/18 0023  Urinalysis, Routine w reflex microscopic  Once,   R     10/24/18 0022          Vitals/Pain Today's Vitals   10/24/18 0011 10/24/18 0015 10/24/18 0018 10/24/18 0030  BP:   137/64 137/69  Pulse:   69 71  Resp:   16 (!) 24  Temp:   97.6 F (36.4 C)   TempSrc:   Oral   SpO2: 97%  98% 96%  Weight:  61.2 kg    Height:  '5\' 1"'  (1.549 m)    PainSc:  0-No pain      Isolation Precautions No active isolations  Medications Medications - No data to display  Mobility walks Low fall risk   Focused Assessments Neuro Assessment Handoff:  Swallow screen pass? Yes    NIH Stroke Scale ( + Modified Stroke Scale Criteria)  Interval: Initial Level of Consciousness (1a.)   : Alert, keenly responsive LOC Questions (1b. )   +: Answers both questions correctly LOC Commands (1c. )   + : Performs both tasks correctly Best Gaze (2. )  +: Normal Visual (3. )  +: No visual loss Facial Palsy (4. )    : Normal symmetrical movements Motor Arm, Left (5a. )   +: No drift Motor Arm, Right (5b. )   +: No drift Motor Leg, Left (6a. )   +: No drift Motor Leg, Right (6b. )   +: No drift Limb Ataxia (7. ): Absent Sensory (8. )   +: Normal, no sensory loss Best Language (9. )   +: No aphasia Dysarthria (10. ): Normal Extinction/Inattention (11.)   +: No Abnormality Modified SS Total  +: 0 Complete NIHSS TOTAL: 0 Last date known well: 10/24/18 Last time known well: 2200 Neuro Assessment: Within Defined Limits Neuro Checks:   Initial (10/24/18 0034)  Last Documented NIHSS Modified Score: 0 (10/24/18 0034) Has TPA been given? No If patient is a Neuro Trauma and patient is going to OR before floor call report to [place] nurse: [phone] or [phone]     R Recommendations:  See Admitting Provider Note  Report given to:   Additional Notes: N/A

## 2018-10-24 NOTE — Evaluation (Addendum)
Occupational Therapy Evaluation and Discharge Patient Details Name: Cynthia Garza MRN: [id number] DOB: [birth year] Today's Date: 10/24/2018    History of Present Illness Pt is a 85 y/o female presenting with a 30 minute episode of R sided weakness. CT negative.  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 PMH: HTN, BRCA s/p lumpectomy, depression, anxiety.    Clinical Impression   PTA independent.  Admitted for above and presenting near baseline functional status independent level for self care, transfers, mobility. Slight weakness noted in R UE (compared to L UE), but coordination and sensation intact.  No deficits noted in vision, cognition, balance.  Educated on [place].  Based on performance today, no further OT needs have been identified.  Thank you for this referral.  OT signing off.     Follow Up Recommendations  No OT follow up    Equipment Recommendations  None recommended by OT    Recommendations for Other Services       Precautions / Restrictions Precautions Precautions: None Restrictions Weight Bearing Restrictions: No      Mobility Bed Mobility Overal bed mobility: Modified Independent             General bed mobility comments: no assist required  Transfers Overall transfer level: Modified independent Equipment used: None             General transfer comment: no assist required    Balance Overall balance assessment: No apparent balance deficits (not formally assessed)                                         ADL either performed or assessed with clinical judgement   ADL Overall ADL's : Modified independent                                       General ADL Comments: pt at modified independent level for tasks in room today      Vision Baseline Vision/History: Wears glasses Wears Glasses: Reading only Patient Visual Report: No change from  baseline Vision Assessment?: No apparent visual deficits     Perception     Praxis      Pertinent Vitals/Pain Pain Assessment: No/denies pain     Hand Dominance Right   Extremity/Trunk Assessment Upper Extremity Assessment Upper Extremity Assessment: RUE deficits/detail RUE Deficits / Details: slightly weaker than L UE (4/5, compared to 4+/5 L UE), coordination and sensation intact  RUE Sensation: WNL RUE Coordination: WNL   Lower Extremity Assessment Lower Extremity Assessment: LLE deficits/detail LLE Deficits / Details: reports impaired sensation in lateral aspect of ankle/foot        Communication Communication Communication: No difficulties   Cognition Arousal/Alertness: Awake/alert Behavior During Therapy: WFL for tasks assessed/performed Overall Cognitive Status: Within Functional Limits for tasks assessed                                     General Comments       Exercises     Shoulder Instructions      Home Living Family/patient expects to be discharged to:: Private residence Living Arrangements: Spouse/significant other Available Help at Discharge: Family;Available 24 hours/day Type of Home: White  Access: Stairs to enter [employer] of Steps: 3 Entrance Stairs-Rails: (+ rail) Home Layout: One level;Other (Comment)(1 step from den to [employer])     Bathroom Shower/Tub: Teacher, early years/pre: Standard     Home Equipment: Environmental consultant - 2 wheels;Cane - single point;Wheelchair - manual;Bedside commode          Prior Functioning/Environment Level of Independence: Independent        Comments: driving, med mgmt         OT Problem List: Decreased activity tolerance;Impaired sensation      OT Treatment/Interventions:      OT Goals(Current goals can be found in the care plan section) Acute Rehab OT Goals Patient Stated Goal: to get some rest  OT Goal Formulation: With patient  OT Frequency:      Barriers to D/C:            Co-evaluation              AM-PAC OT "6 Clicks" Daily Activity     Outcome Measure Help from another person eating meals?: None Help from another person taking care of personal grooming?: None Help from another person toileting, which includes using toliet, bedpan, or urinal?: None Help from another person bathing (including washing, rinsing, drying)?: None Help from another person to put on and taking off regular upper body clothing?: None Help from another person to put on and taking off regular lower body clothing?: None 6 Click Score: 24   End of Session Nurse Communication: Mobility status  Activity Tolerance: Patient tolerated treatment well Patient left: in bed;with call bell/phone within reach  OT Visit Diagnosis: Unsteadiness on feet (R26.81);Muscle weakness (generalized) (M62.81)                Time: 1610-9604 OT Time Calculation (min): 18 min Charges:  OT General Charges $OT Visit: 1 Visit OT Evaluation $OT Eval Low Complexity: 1 Low  Delight Stare, OT Acute Rehabilitation Services Pager [phone] Office [phone]   Delight Stare 10/24/2018, 9:33 AM

## 2018-10-24 NOTE — Progress Notes (Signed)
Pt received from ED to 3W09. Initial assessment and NIH stroke scale complete. Tele applied, CCMD notified. Pt oriented to room and call bell system. Bed in lowest position, call bell in reach. Will continue to monitor.

## 2018-10-24 NOTE — Plan of Care (Signed)
  Problem: Self-Care: Goal: Ability to participate in self-care as condition permits will improve Outcome: Progressing   Problem: Self-Care: Goal: Ability to participate in self-care as condition permits will improve Outcome: Progressing   Problem: Ischemic Stroke/TIA Tissue Perfusion: Goal: Complications of ischemic stroke/TIA will be minimized Outcome: Progressing   Problem: Pain Managment: Goal: General experience of comfort will improve Outcome: Progressing   Problem: Safety: Goal: Ability to remain free from injury will improve Outcome: Progressing   Problem: Education: Goal: Knowledge of disease or condition will improve Outcome: Progressing   Problem: Self-Care: Goal: Ability to participate in self-care as condition permits will improve Outcome: Progressing   Problem: Ischemic Stroke/TIA Tissue Perfusion: Goal: Complications of ischemic stroke/TIA will be minimized Outcome: Progressing   Problem: Pain Managment: Goal: General experience of comfort will improve Outcome: Progressing   Problem: Safety: Goal: Ability to remain free from injury will improve Outcome: Progressing

## 2018-10-24 NOTE — ED Notes (Signed)
Attempted report 

## 2018-10-24 NOTE — Progress Notes (Signed)
Physical Therapy Evaluation Patient Details Name: Cynthia Garza MRN: [id number] DOB: 1932/06/08 Today's Date: 10/24/2018   History of Present Illness  Pt is a 85 y/o female presenting with a 30 minute episode of R sided weakness. CT negative.  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 PMH: HTN, BRCA s/p lumpectomy, depression, anxiety.     Clinical Impression  Patient evaluated by Physical Therapy with no further acute PT needs identified. All education has been completed and the patient has no further questions. PTA, pt independent. Today presents at baseline ambulating without difficulty, reports her symptoms have resolved.  See below for any follow-up Physical Therapy or equipment needs. PT is signing off. Thank you for this referral.     Follow Up Recommendations No PT follow up    Equipment Recommendations  None recommended by PT    Recommendations for Other Services       Precautions / Restrictions Precautions Precautions: None Restrictions Weight Bearing Restrictions: No      Mobility  Bed Mobility Overal bed mobility: Modified Independent             General bed mobility comments: no assist required  Transfers Overall transfer level: Modified independent Equipment used: None             General transfer comment: no assist required  Ambulation/Gait Ambulation/Gait assistance: Modified independent (Device/Increase time) Gait Distance (Feet): 50 Feet   Gait Pattern/deviations: WFL(Within Functional Limits)        Stairs            Wheelchair Mobility    Modified Rankin (Stroke Patients Only)       Balance Overall balance assessment: No apparent balance deficits (not formally assessed)                                           Pertinent Vitals/Pain      Home Living Family/patient expects to be discharged to:: Private residence Living Arrangements:  Spouse/significant other Available Help at Discharge: Family;Available 24 hours/day Type of Home: House Home Access: Stairs to enter Entrance Stairs-Rails: (+ rail) Technical brewer of Steps: 3 Home Layout: One level;Other (Comment)(1 step from den to [employer]) Home Equipment: Gilford Rile - 2 wheels;Cane - single point;Wheelchair - manual;Bedside commode      Prior Function Level of Independence: Independent         Comments: driving, med mgmt      Hand Dominance   Dominant Hand: Right    Extremity/Trunk Assessment   Upper Extremity Assessment RUE Deficits / Details: slightly weaker than L UE (4/5, compared to 4+/5 L UE), coordination and sensation intact  RUE Sensation: WNL RUE Coordination: WNL    Lower Extremity Assessment Lower Extremity Assessment: Overall WFL for tasks assessed LLE Deficits / Details: reports impaired sensation in lateral aspect of ankle/foot        Communication   Communication: No difficulties  Cognition Arousal/Alertness: Awake/alert Behavior During Therapy: WFL for tasks assessed/performed Overall Cognitive Status: Within Functional Limits for tasks assessed                                        General Comments      Exercises     Assessment/Plan    PT  Assessment    PT Problem List         PT Treatment Interventions      PT Goals (Current goals can be found in the Care Plan section)  Acute Rehab PT Goals Patient Stated Goal: to get some rest  PT Goal Formulation: With patient    Frequency     Barriers to discharge        Co-evaluation               AM-PAC PT "6 Clicks" Mobility  Outcome Measure Help needed turning from your back to your side while in a flat bed without using bedrails?: None Help needed moving from lying on your back to sitting on the side of a flat bed without using bedrails?: None Help needed moving to and from a bed to a chair (including a wheelchair)?: None Help needed  standing up from a chair using your arms (e.g., wheelchair or bedside chair)?: None Help needed to walk in hospital room?: None Help needed climbing 3-5 steps with a railing? : None 6 Click Score: 24    End of Session   Activity Tolerance: Patient tolerated treatment well Patient left: in bed        Time: 1705-1720 PT Time Calculation (min) (ACUTE ONLY): 15 min   Charges:   PT Evaluation $PT Eval Low Complexity: 1 Low          Reinaldo Berber, PT, DPT Acute Rehabilitation Services Pager: [phone] Office: [place] 10/24/2018, 5:41 PM

## 2018-10-24 NOTE — ED Notes (Signed)
Pt's daughters, with the permission of patient was updated by telephone of pt status and plan of care.

## 2018-10-24 NOTE — Progress Notes (Signed)
Lower extremity venous has been completed.   Preliminary results in CV Proc.   Abram Sander 10/24/2018 10:41 AM

## 2018-10-24 NOTE — Progress Notes (Addendum)
PROGRESS NOTE    Cynthia Garza  XQJ:[id number] DOB: Mar 18, 1933 DOA: 10/24/2018 PCP: Cassandria Anger, MD  Brief Narrative: 85 year old female with history of breast cancer on Arimidex, essential hypertension was admitted earlier this morning after presenting to the ED with right arm weakness. -Symptoms have improved, MRI noted 7 mm acute ischemic infarct in the mid posterior left centrum semiovale  Assessment & Plan:   1.  Acute CVA -MRI noted 7 mm acute ischemic infarct in the mid posterior left centrum semiovale, MRA noted short-segment severe cavernous left ICA stenosis -Right arm weakness has resolved -Follow-up echocardiogram and carotid duplex -Started on aspirin 81 mg daily and Plavix 75 mg daily -Neurology consulting -Hemoglobin A1c is 5.6, LDL is 91, she is on pravastatin will defer to neurology whether this needs to be changed -PT OT, SLP evaluations today  2.  Hypertension -Holding amlodipine, HCTZ/losartan to allow for permissive hypertension in the setting of acute CVA  3.  History of breast cancer -Continue Arimidex  DVT prophylaxis: Lovenox Code Status: Full code Family Communication: Will update daughter later today Disposition Plan: Home pending work-up likely tomorrow  Consultants:   Neurology   Procedures:   Antimicrobials:    Subjective: -Feels better, denies any residual right sided weakness  Objective: Vitals:   10/24/18 0352 10/24/18 0545 10/24/18 0813 10/24/18 1000  BP: 128/62 (!) 118/57 (!) 134/49 (!) 116/59  Pulse: 67 70 75   Resp: 12 18 16    Temp: 98.2 F (36.8 C) 98.4 F (36.9 C) 98.1 F (36.7 C) 98 F (36.7 C)  TempSrc: Oral Oral Oral Oral  SpO2: 97% 96% 99% 97%  Weight:      Height:        Intake/Output Summary (Last 24 hours) at 10/24/2018 1037 Last data filed at 10/24/2018 0800 Gross per 24 hour  Intake 360 ml  Output --  Net 360 ml   Filed Weights   10/24/18 0015  Weight: 61.2 kg    Examination:  General  exam: Appears calm and comfortable, AAO x3, no distress Respiratory system: Clear to auscultation. Respiratory effort normal. Cardiovascular system: S1 & S2 heard, RRR. No JVD, murmurs, rubs, gallops Gastrointestinal system: Abdomen is nondistended, soft and nontender.Normal bowel sounds heard. Central nervous system: Alert and oriented. No focal neurological deficits. Extremities: No edema Skin: No rashes, lesions or ulcers Psychiatry: Judgement and insight appear normal. Mood & affect appropriate.     Data Reviewed:   CBC: Recent Labs  Lab 10/24/18 0025 10/24/18 0031  WBC 7.9  --   NEUTROABS 4.4  --   HGB 13.0 12.9  HCT 40.4 38.0  MCV 85.2  --   PLT 206  --    Basic Metabolic Panel: Recent Labs  Lab 10/24/18 0025 10/24/18 0031  NA 141 141  K 3.3* 3.3*  CL 104 102  CO2 26  --   GLUCOSE 108* 105*  BUN 20 22  CREATININE 1.02* 1.00  CALCIUM 9.7  --    GFR: Estimated Creatinine Clearance: 35.8 mL/min (by C-G formula based on SCr of 1 mg/dL). Liver Function Tests: Recent Labs  Lab 10/24/18 0025  AST 22  ALT 11  ALKPHOS 56  BILITOT 0.5  PROT 6.4*  ALBUMIN 3.9   No results for input(s): LIPASE, AMYLASE in the last 168 hours. No results for input(s): AMMONIA in the last 168 hours. Coagulation Profile: Recent Labs  Lab 10/24/18 0025  INR 1.0   Cardiac Enzymes: Recent Labs  Lab 10/24/18 0025  TROPONINI <0.03   BNP (last 3 results) No results for input(s): PROBNP in the last 8760 hours. HbA1C: Recent Labs    10/24/18 0025  HGBA1C 5.6   CBG: No results for input(s): GLUCAP in the last 168 hours. Lipid Profile: Recent Labs    10/24/18 0025  CHOL 206*  HDL 55  LDLCALC 91  TRIG 299*  CHOLHDL 3.7   Thyroid Function Tests: No results for input(s): TSH, T4TOTAL, FREET4, T3FREE, THYROIDAB in the last 72 hours. Anemia Panel: No results for input(s): VITAMINB12, FOLATE, FERRITIN, TIBC, IRON, RETICCTPCT in the last 72 hours. Urine analysis:      Component Value Date/Time   COLORURINE YELLOW 12/18/2015 0936   APPEARANCEUR CLEAR 12/18/2015 0936   LABSPEC 1.010 12/18/2015 0936   PHURINE 7.0 12/18/2015 0936   GLUCOSEU NEGATIVE 12/18/2015 0936   HGBUR NEGATIVE 12/18/2015 0936   BILIRUBINUR negative 10/31/2017 1215   BILIRUBINUR neg 12/03/2011 1540   KETONESUR negative 10/31/2017 1215   KETONESUR NEGATIVE 12/18/2015 0936   PROTEINUR negative 10/31/2017 1215   PROTEINUR 100 (A) 12/04/2011 1640   UROBILINOGEN 0.2 10/31/2017 1215   UROBILINOGEN 0.2 12/18/2015 0936   NITRITE Negative 10/31/2017 1215   NITRITE NEGATIVE 12/18/2015 0936   LEUKOCYTESUR Negative 10/31/2017 1215   Sepsis Labs: @LABRCNTIP (procalcitonin:4,lacticidven:4)  ) Recent Results (from the past 240 hour(s))  SARS Coronavirus 2 (CEPHEID - Performed in [place] hospital lab), Hosp Order     Status: None   Collection Time: 10/24/18 12:34 AM  Result Value Ref Range Status   SARS Coronavirus 2 NEGATIVE NEGATIVE Final    Comment: (NOTE) If result is NEGATIVE SARS-CoV-2 target nucleic acids are NOT DETECTED. The SARS-CoV-2 RNA is generally detectable in upper and lower  respiratory specimens during the acute phase of infection. The lowest  concentration of SARS-CoV-2 viral copies this assay can detect is 250  copies / mL. A negative result does not preclude SARS-CoV-2 infection  and should not be used as the sole basis for treatment or other  patient management decisions.  A negative result may occur with  improper specimen collection / handling, submission of specimen other  than nasopharyngeal swab, presence of viral mutation(s) within the  areas targeted by this assay, and inadequate number of viral copies  (<250 copies / mL). A negative result must be combined with clinical  observations, patient history, and epidemiological information. If result is POSITIVE SARS-CoV-2 target nucleic acids are DETECTED. The SARS-CoV-2 RNA is generally detectable in upper  and lower  respiratory specimens dur ing the acute phase of infection.  Positive  results are indicative of active infection with SARS-CoV-2.  Clinical  correlation with patient history and other diagnostic information is  necessary to determine patient infection status.  Positive results do  not rule out bacterial infection or co-infection with other viruses. If result is PRESUMPTIVE POSTIVE SARS-CoV-2 nucleic acids MAY BE PRESENT.   A presumptive positive result was obtained on the submitted specimen  and confirmed on repeat testing.  While 2019 novel coronavirus  (SARS-CoV-2) nucleic acids may be present in the submitted sample  additional confirmatory testing may be necessary for epidemiological  and / or clinical management purposes  to differentiate between  SARS-CoV-2 and other Sarbecovirus currently known to infect humans.  If clinically indicated additional testing with an alternate test  methodology [phone]) is advised. The SARS-CoV-2 RNA is generally  detectable in upper and lower respiratory sp ecimens during the acute  phase of infection. The expected result is Negative.  Fact Sheet for Patients:  StrictlyIdeas.no Fact Sheet for Healthcare Providers: BankingDealers.co.za This test is not yet approved or cleared by the [place] FDA and has been authorized for detection and/or diagnosis of SARS-CoV-2 by FDA under an Emergency Use Authorization (EUA).  This EUA will remain in effect (meaning this test can be used) for the duration of the COVID-19 declaration under Section 564(b)(1) of the Act, 21 U.S.C. section 360bbb-3(b)(1), unless the authorization is terminated or revoked sooner. Performed at [facility], [place] [address] [place], [place] [address]          Radiology Studies: Ct Head Wo Contrast  Result Date: 10/24/2018 CLINICAL DATA:  Right upper extremity heaviness and lower extremity numbness EXAM: CT  HEAD WITHOUT CONTRAST TECHNIQUE: Contiguous axial images were obtained from the base of the skull through the vertex without intravenous contrast. COMPARISON:  None. FINDINGS: Brain: No acute territorial infarction, hemorrhage, or intracranial mass. The ventricles are nonenlarged. Vascular: No hyperdense vessels.  Carotid vascular calcification. Skull: Normal. Negative for fracture or focal lesion. Sinuses/Orbits: No acute finding. Other: Chronic appearing abnormality at the anterior arch of C1, incompletely visualized. Prominent degenerative changes at the C1-C2 articulation. IMPRESSION: Negative non contrasted CT appearance of the brain.  Mild atrophy Electronically Signed   By: Donavan Foil M.D.   On: 10/24/2018 01:00   Mr Brain Wo Contrast  Result Date: 10/24/2018 CLINICAL DATA:  Initial evaluation for new onset right-sided arm and leg weakness. EXAM: MRI HEAD WITHOUT CONTRAST MRA HEAD WITHOUT CONTRAST TECHNIQUE: Multiplanar, multiecho pulse sequences of the brain and surrounding structures were obtained without intravenous contrast. Angiographic images of the head were obtained using MRA technique without contrast. COMPARISON:  Prior CT from earlier the same day. FINDINGS: MRI HEAD FINDINGS Brain: Cerebral volume within normal limits for age. Minimal T2/FLAIR hyperintensity noted within the periventricular white matter, nonspecific, and felt to be within normal limits for age. 7 mm focus of diffusion abnormality seen at the mid-posterior left centrum semi ovale (series 5, image 80). No associated hemorrhage. No other evidence for acute or subacute ischemia. Gray-white matter differentiation otherwise maintained. No other areas of remote cortical infarction. No foci of susceptibility artifact to suggest acute or chronic intracranial hemorrhage. No mass lesion, midline shift or mass effect. No hydrocephalus. No extra-axial fluid collection. Pituitary gland suprasellar region normal. Midline structures  intact. Vascular: Major intracranial vascular flow voids are maintained. Skull and upper cervical spine: Craniocervical junction normal. Bone marrow signal intensity within normal limits. Hyperostosis frontalis interna noted. Scalp soft tissues unremarkable. Sinuses/Orbits: Patient status post bilateral ocular lens replacement. Globes orbital soft tissues demonstrate no acute finding. Paranasal sinuses are clear. No mastoid effusion. Inner ear structures normal. Other: None. MRA HEAD FINDINGS ANTERIOR CIRCULATION: Distal cervical segments of the internal carotid arteries are patent with symmetric antegrade flow. Petrous segments widely patent bilaterally. Scattered atheromatous irregularity within the cavernous/supraclinoid ICAs. No significant stenosis seen on the right. There is a short-segment severe stenosis at the anterior genu of the cavernous left ICA (series 9, image 104). ICA termini well perfused. A1 segments patent bilaterally. Normal anterior communicating artery. Anterior cerebral arteries widely patent to their distal aspects. M1 segments widely patent bilaterally. Normal MCA bifurcations. Distal MCA branches well perfused and symmetric. Mild distal small vessel atheromatous irregularity. POSTERIOR CIRCULATION: Vertebral arteries patent to the vertebrobasilar junction without stenosis. Left vertebral artery dominant. Posterior inferior cerebral arteries patent bilaterally. Basilar patent to its distal aspect without stenosis. Superior cerebral arteries patent bilaterally. Both  of the posterior cerebral arteries primarily supplied via the basilar and are well perfused to their distal aspects. No intracranial aneurysm. IMPRESSION: MRI HEAD IMPRESSION: 1. 7 mm acute ischemic nonhemorrhagic infarct involving the deep white matter of the mid-posterior left centrum semi ovale. 2. Otherwise normal brain MRI for age. MRA HEAD IMPRESSION: 1. Negative intracranial MRA for large vessel occlusion. 2. Short-segment  severe cavernous left ICA stenosis. 3. Otherwise negative intracranial MRA. No other hemodynamically significant or correctable stenosis. Electronically Signed   By: Jeannine Boga M.D.   On: 10/24/2018 06:59   Dg Chest Portable 1 View  Result Date: 10/24/2018 CLINICAL DATA:  TIA EXAM: PORTABLE CHEST 1 VIEW COMPARISON:  08/14/2017 FINDINGS: The heart size and mediastinal contours are within normal limits. Both lungs are clear. Surgical clips over the axilla bilaterally. Aortic atherosclerosis. IMPRESSION: No active disease. Electronically Signed   By: Donavan Foil M.D.   On: 10/24/2018 00:43   Mr Virgel Paling BM Contrast  Result Date: 10/24/2018 CLINICAL DATA:  Initial evaluation for new onset right-sided arm and leg weakness. EXAM: MRI HEAD WITHOUT CONTRAST MRA HEAD WITHOUT CONTRAST TECHNIQUE: Multiplanar, multiecho pulse sequences of the brain and surrounding structures were obtained without intravenous contrast. Angiographic images of the head were obtained using MRA technique without contrast. COMPARISON:  Prior CT from earlier the same day. FINDINGS: MRI HEAD FINDINGS Brain: Cerebral volume within normal limits for age. Minimal T2/FLAIR hyperintensity noted within the periventricular white matter, nonspecific, and felt to be within normal limits for age. 7 mm focus of diffusion abnormality seen at the mid-posterior left centrum semi ovale (series 5, image 80). No associated hemorrhage. No other evidence for acute or subacute ischemia. Gray-white matter differentiation otherwise maintained. No other areas of remote cortical infarction. No foci of susceptibility artifact to suggest acute or chronic intracranial hemorrhage. No mass lesion, midline shift or mass effect. No hydrocephalus. No extra-axial fluid collection. Pituitary gland suprasellar region normal. Midline structures intact. Vascular: Major intracranial vascular flow voids are maintained. Skull and upper cervical spine: Craniocervical  junction normal. Bone marrow signal intensity within normal limits. Hyperostosis frontalis interna noted. Scalp soft tissues unremarkable. Sinuses/Orbits: Patient status post bilateral ocular lens replacement. Globes orbital soft tissues demonstrate no acute finding. Paranasal sinuses are clear. No mastoid effusion. Inner ear structures normal. Other: None. MRA HEAD FINDINGS ANTERIOR CIRCULATION: Distal cervical segments of the internal carotid arteries are patent with symmetric antegrade flow. Petrous segments widely patent bilaterally. Scattered atheromatous irregularity within the cavernous/supraclinoid ICAs. No significant stenosis seen on the right. There is a short-segment severe stenosis at the anterior genu of the cavernous left ICA (series 9, image 104). ICA termini well perfused. A1 segments patent bilaterally. Normal anterior communicating artery. Anterior cerebral arteries widely patent to their distal aspects. M1 segments widely patent bilaterally. Normal MCA bifurcations. Distal MCA branches well perfused and symmetric. Mild distal small vessel atheromatous irregularity. POSTERIOR CIRCULATION: Vertebral arteries patent to the vertebrobasilar junction without stenosis. Left vertebral artery dominant. Posterior inferior cerebral arteries patent bilaterally. Basilar patent to its distal aspect without stenosis. Superior cerebral arteries patent bilaterally. Both of the posterior cerebral arteries primarily supplied via the basilar and are well perfused to their distal aspects. No intracranial aneurysm. IMPRESSION: MRI HEAD IMPRESSION: 1. 7 mm acute ischemic nonhemorrhagic infarct involving the deep white matter of the mid-posterior left centrum semi ovale. 2. Otherwise normal brain MRI for age. MRA HEAD IMPRESSION: 1. Negative intracranial MRA for large vessel occlusion. 2. Short-segment severe cavernous left ICA stenosis.  3. Otherwise negative intracranial MRA. No other hemodynamically significant or  correctable stenosis. Electronically Signed   By: Jeannine Boga M.D.   On: 10/24/2018 06:59        Scheduled Meds:  anastrozole  1 mg Oral Daily   aspirin EC  325 mg Oral Daily   buPROPion  150 mg Oral Daily   clopidogrel  75 mg Oral Daily   enoxaparin (LOVENOX) injection  40 mg Subcutaneous Q24H   famotidine  20 mg Oral BID   multivitamin with minerals  1 tablet Oral Daily   polyvinyl alcohol  2 drop Both Eyes QHS   pravastatin  20 mg Oral q1800   vitamin B-12  1,000 mcg Oral Daily   Continuous Infusions:   LOS: 0 days    Time spent: 55min    Domenic Polite, MD [facility] Page via [email or web address], password [email or web address] After 7PM please contact night-coverage  10/24/2018, 10:37 AM

## 2018-10-25 ENCOUNTER — Telehealth: Payer: Self-pay | Admitting: Adult Health

## 2018-10-25 MED ORDER — CLOPIDOGREL BISULFATE 75 MG PO TABS: 75.0000 mg | ORAL_TABLET | Freq: Every day | ORAL | 0 refills | Status: DC

## 2018-10-25 MED ORDER — ASPIRIN EC 325 MG PO TBEC: 325.0000 mg | DELAYED_RELEASE_TABLET | Freq: Every day | ORAL | 0 refills | Status: DC

## 2018-10-25 MED ORDER — PRAVASTATIN SODIUM 20 MG PO TABS: 20.0000 mg | ORAL_TABLET | Freq: Every day | ORAL | 0 refills | Status: DC

## 2018-10-25 NOTE — TOC Transition Note (Signed)
Transition of Care [facility]) - CM/SW Discharge Note   Patient Details  Name: Cynthia Garza MRN: [id number] Date of Birth: [birth year]  Transition of Care [facility]) CM/SW Contact:  Pollie Friar, RN Phone Number: 10/25/2018, 11:27 AM   Clinical Narrative:    Pt discharging home with self care. No f/u per PT/OT and no DME needs. Pt is from home with spouse.  Pt has PCP, insurance and transportation home.   Final next level of care: Home/Self Care Barriers to Discharge: No Barriers Identified   Patient Goals and CMS Choice        Discharge Placement                       Discharge Plan and Services                                     Social Determinants of Health (SDOH) Interventions     Readmission Risk Interventions No flowsheet data found.

## 2018-10-25 NOTE — Telephone Encounter (Signed)
Pt gave consent for VV on the phone/ Pt understands that although there may be some limitations with this type of visit, we will take all precautions to reduce any security or privacy concerns.  Pt understands that this will be treated like an in office visit and we will file with pt's insurance, and there may be a patient responsible charge related to this service. Sent e-mail w link to [email or web address]

## 2018-10-26 ENCOUNTER — Telehealth: Payer: Self-pay | Admitting: *Deleted

## 2018-10-26 NOTE — Telephone Encounter (Signed)
Transition Care Management Follow-up Telephone Call   Date discharged? 10/25/18   How have you been since you were released from the hospital? Pt states she is feeling fine   Do you understand why you were in the hospital? YES   Do you understand the discharge instructions? YES   Where were you discharged to? Home   Items Reviewed:  Medications reviewed: YES, pt states there was no changes  Allergies reviewed: YES  Dietary changes reviewed: NO  Referrals reviewed: No referral recommeded   Functional Questionnaire:   Activities of Daily Living (ADLs):   She states she are independent in the following: bathing and hygiene, feeding, continence, grooming, toileting and dressing States they require assistance with the following: ambulation sometimes   Any transportation issues/concerns?: NO   Any patient concerns? NO   Confirmed importance and date/time of follow-up visits scheduled YES, appt 11/03/18  Provider Appointment booked with Dr. Alain Marion  Confirmed with patient if condition begins to worsen call PCP or go to the ER.  Patient was given the office number and encouraged to call back with question or concerns.  : YES

## 2018-11-02 NOTE — Telephone Encounter (Signed)
I called pt that her visit is being change to Dr Leonie Man due to Janett Billow NP out of office in the am. Pt stated she would like in office visit. I explain she is high risk and will have to wear gloves and mask. She is the only one allowed in the exam room with doctor. Pt was screen with COVID 19 and was negative for all questions. She has not travel outside of the country or state, been exposed to covid 19 or had the virus. No fevers or shortness of breath. I gave directions and explain the check in process will be outside and to wait in car until the RN is ready for her. Pt understands the process.

## 2018-11-03 ENCOUNTER — Other Ambulatory Visit (INDEPENDENT_AMBULATORY_CARE_PROVIDER_SITE_OTHER): Payer: Medicare Other

## 2018-11-03 ENCOUNTER — Encounter: Payer: Self-pay | Admitting: Internal Medicine

## 2018-11-03 ENCOUNTER — Ambulatory Visit (INDEPENDENT_AMBULATORY_CARE_PROVIDER_SITE_OTHER): Payer: Medicare Other | Admitting: Internal Medicine

## 2018-11-03 ENCOUNTER — Other Ambulatory Visit: Payer: Self-pay

## 2018-11-03 VITALS — BP 126/70 | HR 72 | Temp 98.1°F | Ht 61.0 in | Wt 136.0 lb

## 2018-11-03 DIAGNOSIS — E785 Hyperlipidemia, unspecified: Secondary | ICD-10-CM | POA: Diagnosis not present

## 2018-11-03 DIAGNOSIS — F329 Major depressive disorder, single episode, unspecified: Secondary | ICD-10-CM | POA: Diagnosis not present

## 2018-11-03 DIAGNOSIS — I10 Essential (primary) hypertension: Secondary | ICD-10-CM

## 2018-11-03 DIAGNOSIS — F32A Depression, unspecified: Secondary | ICD-10-CM

## 2018-11-03 DIAGNOSIS — Z8673 Personal history of transient ischemic attack (TIA), and cerebral infarction without residual deficits: Secondary | ICD-10-CM | POA: Diagnosis not present

## 2018-11-03 DIAGNOSIS — I63232 Cerebral infarction due to unspecified occlusion or stenosis of left carotid arteries: Secondary | ICD-10-CM | POA: Diagnosis not present

## 2018-11-03 DIAGNOSIS — F5101 Primary insomnia: Secondary | ICD-10-CM | POA: Diagnosis not present

## 2018-11-03 LAB — BASIC METABOLIC PANEL
BUN: 23 mg/dL (ref 6–23)
CO2: 29 mEq/L (ref 19–32)
Calcium: 9.5 mg/dL (ref 8.4–10.5)
Chloride: 103 mEq/L (ref 96–112)
Creatinine, Ser: 0.99 mg/dL (ref 0.40–1.20)
GFR: 53.47 mL/min — ABNORMAL LOW (ref >60.00)
Glucose, Bld: 99 mg/dL (ref 70–99)
Potassium: 3.5 mEq/L (ref 3.5–5.1)
Sodium: 140 mEq/L (ref 135–145)

## 2018-11-03 NOTE — Assessment & Plan Note (Addendum)
Amlodipine Diovan HCT  Labs

## 2018-11-03 NOTE — Assessment & Plan Note (Signed)
Statin intolerant 

## 2018-11-03 NOTE — Discharge Summary (Signed)
Physician Discharge Summary  Cynthia Garza XBD:[id number] DOB: 09-Jun-1932 DOA: 10/24/2018  PCP: Cassandria Anger, MD  Admit date: 10/24/2018 Discharge date: 10/25/2018  Time spent: 35 minutes  Recommendations for Outpatient Follow-up:  1. PCP Dr. Alain Marion in 1 week 2. Neurology Dr.Xu in 1 month   Discharge Diagnoses:  Principal Problem:   Right sided weakness Active Problems:   Essential hypertension   Malignant neoplasm of upper-outer quadrant of right breast in female, estrogen receptor positive ([place])   CVA (cerebral vascular accident) [facility])   Discharge Condition: Improved  Diet recommendation: Low-sodium heart healthy  Filed Weights   10/24/18 0015  Weight: 61.2 kg    History of present illness:  85 year old female with history of breast cancer on Arimidex, essential hypertension was admitted after presenting to the ED with right arm weakness  Hospital Course:   1.  Acute CVA -MRI noted 7 mm acute ischemic infarct in the mid posterior left centrum semiovale, MRA noted short-segment severe cavernous left ICA stenosis -Right arm weakness has resolved -2D echocardiogram noted EF greater than 60%, no cardiac source of embolus, carotid duplex was unremarkable, SARS COVID-19 PCR is negative -She was started on aspirin 325 mg daily and Plavix 75 mg daily for 3 months followed by Plavix alone given large intracranial vessel high-grade stenosis by neurology -Hemoglobin A1c is 5.6, LDL is 91, she is on pravastatin this was continued -PT OT, SLP evaluations completed no therapy follow-up recommended  2.  Hypertension -Initially held amlodipine, HCTZ/losartan to allow for permissive hypertension in the setting of acute CVA -Resumed at discharge  3.  History of breast cancer -Continue Arimidex  Discharge Exam: Vitals:   10/25/18 0420 10/25/18 0909  BP: 114/72 (!) 127/55  Pulse: 68 70  Resp: 18 15  Temp: 97.6 F (36.4 C) 98 F (36.7 C)  SpO2: 96% 97%     General: AAOx3 Cardiovascular: S1S2/RRR Respiratory: CTAB  Discharge Instructions   Discharge Instructions    Ambulatory referral to Neurology   Complete by:  As directed    Follow up with stroke clinic NP (Jessica Vanschaick or Cecille Rubin, if both not available, consider Zachery Dauer, or [place]) at [facility] in about 4 weeks. Thanks.   Diet - low sodium heart healthy   Complete by:  As directed    Increase activity slowly   Complete by:  As directed      Allergies as of 10/25/2018      Reactions   Cholestoff [plant Sterols And Stanols]    unknown   Lexapro [escitalopram Oxalate]    fatigue   Simvastatin Other (See Comments)   aches   Diflucan [fluconazole] Rash      Medication List    TAKE these medications   Alive Once Daily Womens 50+ Tabs Take 1 tablet by mouth daily.   PRESERVISION AREDS 2 PO Take 1 tablet by mouth 2 (two) times daily.   amLODipine 5 MG tablet Commonly known as:  NORVASC TAKE 1 TABLET BY MOUTH DAILY. PATIENT NEEDS OFFICE VISIT BEFORE REFILLS WILL BE GIVEN What changed:  See the new instructions.   anastrozole 1 MG tablet Commonly known as:  ARIMIDEX TAKE 1 TABLET BY MOUTH EVERY DAY What changed:    how much to take  how to take this  when to take this  additional instructions   aspirin EC 325 MG tablet Take 1 tablet (325 mg total) by mouth at bedtime. What changed:    medication strength  how much to take  buPROPion 150 MG 24 hr tablet Commonly known as:  WELLBUTRIN XL TAKE 1 TABLET BY MOUTH EVERY DAY   clopidogrel 75 MG tablet Commonly known as:  PLAVIX Take 1 tablet (75 mg total) by mouth daily.   ipratropium 0.03 % nasal spray Commonly known as:  ATROVENT PLACE 2 SPRAYS INTO BOTH NOSTRILS 2 (TWO) TIMES DAILY. DO NOT USE FOR MORE THAN 5DAYS. What changed:  additional instructions   meclizine 12.5 MG tablet Commonly known as:  ANTIVERT Take 1 tablet (12.5 mg total) by mouth 2 (two) times daily as needed for  dizziness.   pravastatin 20 MG tablet Commonly known as:  PRAVACHOL Take 1 tablet (20 mg total) by mouth daily at 6 PM.   ranitidine 150 MG tablet Commonly known as:  ZANTAC Take 150 mg by mouth 2 (two) times daily.   Systane Balance 0.6 % Soln Generic drug:  Propylene Glycol Place 2 drops into both eyes at bedtime.   valsartan-hydrochlorothiazide 320-25 MG tablet Commonly known as:  DIOVAN-HCT TAKE 1 TABLET BY MOUTH DAILY.   vitamin B-12 1000 MCG tablet Commonly known as:  CYANOCOBALAMIN Take 1,000 mcg by mouth daily.   zolpidem 10 MG tablet Commonly known as:  AMBIEN Take 1 tablet (10 mg total) by mouth at bedtime as needed for sleep.      Allergies  Allergen Reactions  . Cholestoff [Plant Sterols And Stanols]     unknown  . Lexapro [Escitalopram Oxalate]     fatigue  . Simvastatin Other (See Comments)    aches  . Diflucan [Fluconazole] Rash   Follow-up Information    [facility]. Schedule an appointment as soon as possible for a visit in 4 week(s).   Specialty:  Neurology Contact information: [address] [place] [phone]       Plotnikov, Evie Lacks, MD Follow up in 1 week(s).   Specialty:  Internal Medicine Contact information: [place] [address] [phone]            The results of significant diagnostics from this hospitalization (including imaging, microbiology, ancillary and laboratory) are listed below for reference.    Significant Diagnostic Studies: Ct Head Wo Contrast  Result Date: 10/24/2018 CLINICAL DATA:  Right upper extremity heaviness and lower extremity numbness EXAM: CT HEAD WITHOUT CONTRAST TECHNIQUE: Contiguous axial images were obtained from the base of the skull through the vertex without intravenous contrast. COMPARISON:  None. FINDINGS: Brain: No acute territorial infarction, hemorrhage, or intracranial mass. The ventricles are nonenlarged.  Vascular: No hyperdense vessels.  Carotid vascular calcification. Skull: Normal. Negative for fracture or focal lesion. Sinuses/Orbits: No acute finding. Other: Chronic appearing abnormality at the anterior arch of C1, incompletely visualized. Prominent degenerative changes at the C1-C2 articulation. IMPRESSION: Negative non contrasted CT appearance of the brain.  Mild atrophy Electronically Signed   By: Donavan Foil M.D.   On: 10/24/2018 01:00   Mr Brain Wo Contrast  Result Date: 10/24/2018 CLINICAL DATA:  Initial evaluation for new onset right-sided arm and leg weakness. EXAM: MRI HEAD WITHOUT CONTRAST MRA HEAD WITHOUT CONTRAST TECHNIQUE: Multiplanar, multiecho pulse sequences of the brain and surrounding structures were obtained without intravenous contrast. Angiographic images of the head were obtained using MRA technique without contrast. COMPARISON:  Prior CT from earlier the same day. FINDINGS: MRI HEAD FINDINGS Brain: Cerebral volume within normal limits for age. Minimal T2/FLAIR hyperintensity noted within the periventricular white matter, nonspecific, and felt to be within normal limits  for age. 7 mm focus of diffusion abnormality seen at the mid-posterior left centrum semi ovale (series 5, image 80). No associated hemorrhage. No other evidence for acute or subacute ischemia. Gray-white matter differentiation otherwise maintained. No other areas of remote cortical infarction. No foci of susceptibility artifact to suggest acute or chronic intracranial hemorrhage. No mass lesion, midline shift or mass effect. No hydrocephalus. No extra-axial fluid collection. Pituitary gland suprasellar region normal. Midline structures intact. Vascular: Major intracranial vascular flow voids are maintained. Skull and upper cervical spine: Craniocervical junction normal. Bone marrow signal intensity within normal limits. Hyperostosis frontalis interna noted. Scalp soft tissues unremarkable. Sinuses/Orbits: Patient  status post bilateral ocular lens replacement. Globes orbital soft tissues demonstrate no acute finding. Paranasal sinuses are clear. No mastoid effusion. Inner ear structures normal. Other: None. MRA HEAD FINDINGS ANTERIOR CIRCULATION: Distal cervical segments of the internal carotid arteries are patent with symmetric antegrade flow. Petrous segments widely patent bilaterally. Scattered atheromatous irregularity within the cavernous/supraclinoid ICAs. No significant stenosis seen on the right. There is a short-segment severe stenosis at the anterior genu of the cavernous left ICA (series 9, image 104). ICA termini well perfused. A1 segments patent bilaterally. Normal anterior communicating artery. Anterior cerebral arteries widely patent to their distal aspects. M1 segments widely patent bilaterally. Normal MCA bifurcations. Distal MCA branches well perfused and symmetric. Mild distal small vessel atheromatous irregularity. POSTERIOR CIRCULATION: Vertebral arteries patent to the vertebrobasilar junction without stenosis. Left vertebral artery dominant. Posterior inferior cerebral arteries patent bilaterally. Basilar patent to its distal aspect without stenosis. Superior cerebral arteries patent bilaterally. Both of the posterior cerebral arteries primarily supplied via the basilar and are well perfused to their distal aspects. No intracranial aneurysm. IMPRESSION: MRI HEAD IMPRESSION: 1. 7 mm acute ischemic nonhemorrhagic infarct involving the deep white matter of the mid-posterior left centrum semi ovale. 2. Otherwise normal brain MRI for age. MRA HEAD IMPRESSION: 1. Negative intracranial MRA for large vessel occlusion. 2. Short-segment severe cavernous left ICA stenosis. 3. Otherwise negative intracranial MRA. No other hemodynamically significant or correctable stenosis. Electronically Signed   By: Jeannine Boga M.D.   On: 10/24/2018 06:59   Dg Chest Portable 1 View  Result Date: 10/24/2018 CLINICAL  DATA:  TIA EXAM: PORTABLE CHEST 1 VIEW COMPARISON:  08/14/2017 FINDINGS: The heart size and mediastinal contours are within normal limits. Both lungs are clear. Surgical clips over the axilla bilaterally. Aortic atherosclerosis. IMPRESSION: No active disease. Electronically Signed   By: Donavan Foil M.D.   On: 10/24/2018 00:43   Mr Virgel Paling XQ Contrast  Result Date: 10/24/2018 CLINICAL DATA:  Initial evaluation for new onset right-sided arm and leg weakness. EXAM: MRI HEAD WITHOUT CONTRAST MRA HEAD WITHOUT CONTRAST TECHNIQUE: Multiplanar, multiecho pulse sequences of the brain and surrounding structures were obtained without intravenous contrast. Angiographic images of the head were obtained using MRA technique without contrast. COMPARISON:  Prior CT from earlier the same day. FINDINGS: MRI HEAD FINDINGS Brain: Cerebral volume within normal limits for age. Minimal T2/FLAIR hyperintensity noted within the periventricular white matter, nonspecific, and felt to be within normal limits for age. 7 mm focus of diffusion abnormality seen at the mid-posterior left centrum semi ovale (series 5, image 80). No associated hemorrhage. No other evidence for acute or subacute ischemia. Gray-white matter differentiation otherwise maintained. No other areas of remote cortical infarction. No foci of susceptibility artifact to suggest acute or chronic intracranial hemorrhage. No mass lesion, midline shift or mass effect. No hydrocephalus. No extra-axial fluid collection.  Pituitary gland suprasellar region normal. Midline structures intact. Vascular: Major intracranial vascular flow voids are maintained. Skull and upper cervical spine: Craniocervical junction normal. Bone marrow signal intensity within normal limits. Hyperostosis frontalis interna noted. Scalp soft tissues unremarkable. Sinuses/Orbits: Patient status post bilateral ocular lens replacement. Globes orbital soft tissues demonstrate no acute finding. Paranasal sinuses  are clear. No mastoid effusion. Inner ear structures normal. Other: None. MRA HEAD FINDINGS ANTERIOR CIRCULATION: Distal cervical segments of the internal carotid arteries are patent with symmetric antegrade flow. Petrous segments widely patent bilaterally. Scattered atheromatous irregularity within the cavernous/supraclinoid ICAs. No significant stenosis seen on the right. There is a short-segment severe stenosis at the anterior genu of the cavernous left ICA (series 9, image 104). ICA termini well perfused. A1 segments patent bilaterally. Normal anterior communicating artery. Anterior cerebral arteries widely patent to their distal aspects. M1 segments widely patent bilaterally. Normal MCA bifurcations. Distal MCA branches well perfused and symmetric. Mild distal small vessel atheromatous irregularity. POSTERIOR CIRCULATION: Vertebral arteries patent to the vertebrobasilar junction without stenosis. Left vertebral artery dominant. Posterior inferior cerebral arteries patent bilaterally. Basilar patent to its distal aspect without stenosis. Superior cerebral arteries patent bilaterally. Both of the posterior cerebral arteries primarily supplied via the basilar and are well perfused to their distal aspects. No intracranial aneurysm. IMPRESSION: MRI HEAD IMPRESSION: 1. 7 mm acute ischemic nonhemorrhagic infarct involving the deep white matter of the mid-posterior left centrum semi ovale. 2. Otherwise normal brain MRI for age. MRA HEAD IMPRESSION: 1. Negative intracranial MRA for large vessel occlusion. 2. Short-segment severe cavernous left ICA stenosis. 3. Otherwise negative intracranial MRA. No other hemodynamically significant or correctable stenosis. Electronically Signed   By: Jeannine Boga M.D.   On: 10/24/2018 06:59   Vas US Carotid (at [place] Only)  Result Date: 10/25/2018 Carotid Arterial Duplex Study Indications:  CVA. Risk Factors: Hypertension. Performing Technologist: Abram Sander RVS   Examination Guidelines: A complete evaluation includes B-mode imaging, spectral Doppler, color Doppler, and power Doppler as needed of all accessible portions of each vessel. Bilateral testing is considered an integral part of a complete examination. Limited examinations for reoccurring indications may be performed as noted.  Right Carotid Findings: +----------+--------+--------+--------+------------+--------+           PSV cm/sEDV cm/sStenosisDescribe    Comments +----------+--------+--------+--------+------------+--------+ CCA Prox  110     19              heterogenous         +----------+--------+--------+--------+------------+--------+ CCA Distal103     19              heterogenous         +----------+--------+--------+--------+------------+--------+ ICA Prox  73      17      1-39%   heterogenous         +----------+--------+--------+--------+------------+--------+ ICA Distal106     24                                   +----------+--------+--------+--------+------------+--------+ ECA       94      6                                    +----------+--------+--------+--------+------------+--------+ +----------+--------+-------+--------+-------------------+           PSV cm/sEDV cmsDescribeArm Pressure (mmHG) +----------+--------+-------+--------+-------------------+ [id number]                                        +----------+--------+-------+--------+-------------------+ +---------+--------+--+--------+--+---------+  VertebralPSV cm/s37EDV cm/s10Antegrade +---------+--------+--+--------+--+---------+  Left Carotid Findings: +----------+--------+--------+--------+------------+--------+           PSV cm/sEDV cm/sStenosisDescribe    Comments +----------+--------+--------+--------+------------+--------+ CCA Prox  132     14              heterogenous         +----------+--------+--------+--------+------------+--------+ CCA Distal100     17               heterogenous         +----------+--------+--------+--------+------------+--------+ ICA Prox  107     25      1-39%   heterogenous         +----------+--------+--------+--------+------------+--------+ ICA Distal82      13                                   +----------+--------+--------+--------+------------+--------+ ECA       80                                           +----------+--------+--------+--------+------------+--------+ +----------+--------+--------+--------+-------------------+ SubclavianPSV cm/sEDV cm/sDescribeArm Pressure (mmHG) +----------+--------+--------+--------+-------------------+           138                                         +----------+--------+--------+--------+-------------------+ +---------+--------+--+--------+--+---------+ VertebralPSV cm/s67EDV cm/s17Antegrade +---------+--------+--+--------+--+---------+  Summary: Right Carotid: Velocities in the right ICA are consistent with a 1-39% stenosis. Left Carotid: Velocities in the left ICA are consistent with a 1-39% stenosis. Vertebrals: Bilateral vertebral arteries demonstrate antegrade flow. *See table(s) above for measurements and observations.  Electronically signed by Antony Contras MD on 10/25/2018 at 12:23:35 PM.    Final     Microbiology: No results found for this or any previous visit (from the past 240 hour(s)).   Labs: Basic Metabolic Panel: No results for input(s): NA, K, CL, CO2, GLUCOSE, BUN, CREATININE, CALCIUM, MG, PHOS in the last 168 hours. Liver Function Tests: No results for input(s): AST, ALT, ALKPHOS, BILITOT, PROT, ALBUMIN in the last 168 hours. No results for input(s): LIPASE, AMYLASE in the last 168 hours. No results for input(s): AMMONIA in the last 168 hours. CBC: No results for input(s): WBC, NEUTROABS, HGB, HCT, MCV, PLT in the last 168 hours. Cardiac Enzymes: No results for input(s): CKTOTAL, CKMB, CKMBINDEX, TROPONINI in the last 168  hours. BNP: BNP (last 3 results) No results for input(s): BNP in the last 8760 hours.  ProBNP (last 3 results) No results for input(s): PROBNP in the last 8760 hours.  CBG: No results for input(s): GLUCAP in the last 168 hours.     Signed:  Domenic Polite MD.  [facility] 11/03/2018, 10:17 AM

## 2018-11-03 NOTE — Assessment & Plan Note (Signed)
Zolpidem prn 

## 2018-11-03 NOTE — Assessment & Plan Note (Signed)
Wellbutrin

## 2018-11-03 NOTE — Progress Notes (Signed)
Subjective:  Patient ID: Cynthia Garza, female    DOB: May 02, 1933  Age: 85 y.o. MRN: [id number]  CC: No chief complaint on file.   HPI Cynthia Garza presents for a post-hosp f/u for the L CVA. D/c'd 10/25/18. R arm weakness has resolved. F/u HTN, low K  Discharge Diagnoses:  Principal Problem:   Right sided weakness Active Problems:   Essential hypertension   Malignant neoplasm of upper-outer quadrant of right breast in female, estrogen receptor positive (HCC)   CVA (cerebral vascular accident) [facility])   Discharge Condition: Improved  Diet recommendation: Low-sodium heart healthy     Filed Weights   10/24/18 0015  Weight: 61.2 kg    History of present illness:  85 year old female with history of breast cancer on Arimidex, essential hypertension was admitted after presenting to the ED with right arm weakness  Hospital Course:   1.Acute CVA -MRI noted 7 mm acute ischemic infarct in the mid posterior left centrum semiovale, MRA noted short-segment severe cavernous left ICA stenosis -Right arm weakness has resolved -2D echocardiogram noted EF greater than 60%, no cardiac source of embolus, carotid duplex was unremarkable, SARS COVID-19 PCR is negative -She was started on aspirin 325 mg daily and Plavix 75 mg daily for 3 months followed by Plavix alone given large intracranial vessel high-grade stenosis by neurology -Hemoglobin A1c is 5.6,LDL is 91,she is on pravastatin this was continued -PT OT, SLP evaluations completed no therapy follow-up recommended  2.Hypertension -Initially held amlodipine, HCTZ/losartan to allow for permissive hypertension in the setting of acute CVA -Resumed at discharge  3.History of breast cancer -Continue Arimidex  Discharge Exam:     Vitals:   10/25/18 0420 10/25/18 0909  BP: 114/72 (!) 127/55  Pulse: 68 70  Resp: 18 15  Temp: 97.6 F (36.4 C) 98 F (36.7 C)  SpO2: 96% 97%    General:  AAOx3 Cardiovascular: S1S2/RRR Respiratory: CTAB  Discharge Instructions       Discharge Instructions    Ambulatory referral to Neurology   Complete by:  As directed    Follow up with stroke clinic NP (Jessica Vanschaick or Cecille Rubin, if both not available, consider Zachery Dauer, or Ahern) at [facility] in about 4 weeks. Thanks.   Diet - low sodium heart healthy   Complete by:  As directed    Increase activity slowly   Complete by:  As directed        Outpatient Medications Prior to Visit  Medication Sig Dispense Refill   amLODipine (NORVASC) 5 MG tablet TAKE 1 TABLET BY MOUTH DAILY. PATIENT NEEDS OFFICE VISIT BEFORE REFILLS WILL BE GIVEN (Patient taking differently: Take 5 mg by mouth daily. ) 90 tablet 0   anastrozole (ARIMIDEX) 1 MG tablet TAKE 1 TABLET BY MOUTH EVERY DAY (Patient taking differently: Take 1 mg by mouth daily. ) 90 tablet 3   aspirin EC 325 MG tablet Take 1 tablet (325 mg total) by mouth at bedtime. 30 tablet 0   buPROPion (WELLBUTRIN XL) 150 MG 24 hr tablet TAKE 1 TABLET BY MOUTH EVERY DAY (Patient taking differently: Take 150 mg by mouth daily. ) 90 tablet 3   clopidogrel (PLAVIX) 75 MG tablet Take 1 tablet (75 mg total) by mouth daily. 30 tablet 0   Famotidine (PEPCID PO) Take by mouth.     ipratropium (ATROVENT) 0.03 % nasal spray PLACE 2 SPRAYS INTO BOTH NOSTRILS 2 (TWO) TIMES DAILY. DO NOT USE FOR MORE THAN 5DAYS. (Patient taking differently: Place  2 sprays into both nostrils 2 (two) times daily. ) 30 mL 0   meclizine (ANTIVERT) 12.5 MG tablet Take 1 tablet (12.5 mg total) by mouth 2 (two) times daily as needed for dizziness. 60 tablet 2   Multiple Vitamins-Minerals (ALIVE ONCE DAILY WOMENS 50+) TABS Take 1 tablet by mouth daily.     Multiple Vitamins-Minerals (PRESERVISION AREDS 2 PO) Take 1 tablet by mouth 2 (two) times daily.     pravastatin (PRAVACHOL) 20 MG tablet Take 1 tablet (20 mg total) by mouth daily at 6 PM. 30 tablet 0    Propylene Glycol (SYSTANE BALANCE) 0.6 % SOLN Place 2 drops into both eyes at bedtime.     valsartan-hydrochlorothiazide (DIOVAN-HCT) 320-25 MG tablet TAKE 1 TABLET BY MOUTH DAILY. 90 tablet 3   vitamin B-12 (CYANOCOBALAMIN) 1000 MCG tablet Take 1,000 mcg by mouth daily.     zolpidem (AMBIEN) 10 MG tablet Take 1 tablet (10 mg total) by mouth at bedtime as needed for sleep. 90 tablet 1   ranitidine (ZANTAC) 150 MG tablet Take 150 mg by mouth 2 (two) times daily.     No facility-administered medications prior to visit.     ROS: Review of Systems  Constitutional: Negative for activity change, appetite change, chills, fatigue and unexpected weight change.  HENT: Negative for congestion, mouth sores and sinus pressure.   Eyes: Negative for visual disturbance.  Respiratory: Negative for cough and chest tightness.   Gastrointestinal: Negative for abdominal pain and nausea.  Genitourinary: Negative for difficulty urinating, frequency and vaginal pain.  Musculoskeletal: Negative for back pain and gait problem.  Skin: Negative for pallor and rash.  Neurological: Positive for dizziness. Negative for tremors, weakness, numbness and headaches.  Psychiatric/Behavioral: Negative for confusion and sleep disturbance.    Objective:  BP 126/70 (BP Location: Left Arm, Patient Position: Sitting, Cuff Size: Normal)    Pulse 72    Temp 98.1 F (36.7 C) (Oral)    Ht 5\' 1"  (1.549 m)    Wt 136 lb (61.7 kg)    SpO2 97%    BMI 25.70 kg/m   BP Readings from Last 3 Encounters:  11/03/18 126/70  10/25/18 (!) 127/55  12/29/17 118/62    Wt Readings from Last 3 Encounters:  11/03/18 136 lb (61.7 kg)  10/24/18 135 lb (61.2 kg)  12/29/17 133 lb (60.3 kg)    Physical Exam Constitutional:      General: She is not in acute distress.    Appearance: She is well-developed.  HENT:     Head: Normocephalic.     Right Ear: External ear normal.     Left Ear: External ear normal.     Nose: Nose normal.  Eyes:       General:        Right eye: No discharge.        Left eye: No discharge.     Conjunctiva/sclera: Conjunctivae normal.     Pupils: Pupils are equal, round, and reactive to light.  Neck:     Musculoskeletal: Normal range of motion and neck supple.     Thyroid: No thyromegaly.     Vascular: No JVD.     Trachea: No tracheal deviation.  Cardiovascular:     Rate and Rhythm: Normal rate and regular rhythm.     Heart sounds: Normal heart sounds.  Pulmonary:     Effort: No respiratory distress.     Breath sounds: No stridor. No wheezing.  Abdominal:     General:  Bowel sounds are normal. There is no distension.     Palpations: Abdomen is soft. There is no mass.     Tenderness: There is no abdominal tenderness. There is no guarding or rebound.  Musculoskeletal:        General: No tenderness.  Lymphadenopathy:     Cervical: No cervical adenopathy.  Skin:    Findings: No erythema or rash.  Neurological:     Cranial Nerves: No cranial nerve deficit.     Motor: No abnormal muscle tone.     Coordination: Coordination normal.     Deep Tendon Reflexes: Reflexes normal.  Psychiatric:        Behavior: Behavior normal.        Thought Content: Thought content normal.        Judgment: Judgment normal.     Lab Results  Component Value Date   WBC 7.9 10/24/2018   HGB 12.9 10/24/2018   HCT 38.0 10/24/2018   PLT 206 10/24/2018   GLUCOSE 105 (H) 10/24/2018   CHOL 206 (H) 10/24/2018   TRIG 299 (H) 10/24/2018   HDL 55 10/24/2018   LDLDIRECT 144.7 02/09/2012   LDLCALC 91 10/24/2018   ALT 11 10/24/2018   AST 22 10/24/2018   NA 141 10/24/2018   K 3.3 (L) 10/24/2018   CL 102 10/24/2018   CREATININE 1.00 10/24/2018   BUN 22 10/24/2018   CO2 26 10/24/2018   TSH 1.97 12/18/2015   INR 1.0 10/24/2018   HGBA1C 5.6 10/24/2018    Ct Head Wo Contrast  Result Date: 10/24/2018 CLINICAL DATA:  Right upper extremity heaviness and lower extremity numbness EXAM: CT HEAD WITHOUT CONTRAST  TECHNIQUE: Contiguous axial images were obtained from the base of the skull through the vertex without intravenous contrast. COMPARISON:  None. FINDINGS: Brain: No acute territorial infarction, hemorrhage, or intracranial mass. The ventricles are nonenlarged. Vascular: No hyperdense vessels.  Carotid vascular calcification. Skull: Normal. Negative for fracture or focal lesion. Sinuses/Orbits: No acute finding. Other: Chronic appearing abnormality at the anterior arch of C1, incompletely visualized. Prominent degenerative changes at the C1-C2 articulation. IMPRESSION: Negative non contrasted CT appearance of the brain.  Mild atrophy Electronically Signed   By: Donavan Foil M.D.   On: 10/24/2018 01:00   Mr Brain Wo Contrast  Result Date: 10/24/2018 CLINICAL DATA:  Initial evaluation for new onset right-sided arm and leg weakness. EXAM: MRI HEAD WITHOUT CONTRAST MRA HEAD WITHOUT CONTRAST TECHNIQUE: Multiplanar, multiecho pulse sequences of the brain and surrounding structures were obtained without intravenous contrast. Angiographic images of the head were obtained using MRA technique without contrast. COMPARISON:  Prior CT from earlier the same day. FINDINGS: MRI HEAD FINDINGS Brain: Cerebral volume within normal limits for age. Minimal T2/FLAIR hyperintensity noted within the periventricular white matter, nonspecific, and felt to be within normal limits for age. 7 mm focus of diffusion abnormality seen at the mid-posterior left centrum semi ovale (series 5, image 80). No associated hemorrhage. No other evidence for acute or subacute ischemia. Gray-white matter differentiation otherwise maintained. No other areas of remote cortical infarction. No foci of susceptibility artifact to suggest acute or chronic intracranial hemorrhage. No mass lesion, midline shift or mass effect. No hydrocephalus. No extra-axial fluid collection. Pituitary gland suprasellar region normal. Midline structures intact. Vascular: Major  intracranial vascular flow voids are maintained. Skull and upper cervical spine: Craniocervical junction normal. Bone marrow signal intensity within normal limits. Hyperostosis frontalis interna noted. Scalp soft tissues unremarkable. Sinuses/Orbits: Patient status post bilateral ocular lens  replacement. Globes orbital soft tissues demonstrate no acute finding. Paranasal sinuses are clear. No mastoid effusion. Inner ear structures normal. Other: None. MRA HEAD FINDINGS ANTERIOR CIRCULATION: Distal cervical segments of the internal carotid arteries are patent with symmetric antegrade flow. Petrous segments widely patent bilaterally. Scattered atheromatous irregularity within the cavernous/supraclinoid ICAs. No significant stenosis seen on the right. There is a short-segment severe stenosis at the anterior genu of the cavernous left ICA (series 9, image 104). ICA termini well perfused. A1 segments patent bilaterally. Normal anterior communicating artery. Anterior cerebral arteries widely patent to their distal aspects. M1 segments widely patent bilaterally. Normal MCA bifurcations. Distal MCA branches well perfused and symmetric. Mild distal small vessel atheromatous irregularity. POSTERIOR CIRCULATION: Vertebral arteries patent to the vertebrobasilar junction without stenosis. Left vertebral artery dominant. Posterior inferior cerebral arteries patent bilaterally. Basilar patent to its distal aspect without stenosis. Superior cerebral arteries patent bilaterally. Both of the posterior cerebral arteries primarily supplied via the basilar and are well perfused to their distal aspects. No intracranial aneurysm. IMPRESSION: MRI HEAD IMPRESSION: 1. 7 mm acute ischemic nonhemorrhagic infarct involving the deep white matter of the mid-posterior left centrum semi ovale. 2. Otherwise normal brain MRI for age. MRA HEAD IMPRESSION: 1. Negative intracranial MRA for large vessel occlusion. 2. Short-segment severe cavernous left  ICA stenosis. 3. Otherwise negative intracranial MRA. No other hemodynamically significant or correctable stenosis. Electronically Signed   By: Jeannine Boga M.D.   On: 10/24/2018 06:59   Dg Chest Portable 1 View  Result Date: 10/24/2018 CLINICAL DATA:  TIA EXAM: PORTABLE CHEST 1 VIEW COMPARISON:  08/14/2017 FINDINGS: The heart size and mediastinal contours are within normal limits. Both lungs are clear. Surgical clips over the axilla bilaterally. Aortic atherosclerosis. IMPRESSION: No active disease. Electronically Signed   By: Donavan Foil M.D.   On: 10/24/2018 00:43   Mr Virgel Paling AO Contrast  Result Date: 10/24/2018 CLINICAL DATA:  Initial evaluation for new onset right-sided arm and leg weakness. EXAM: MRI HEAD WITHOUT CONTRAST MRA HEAD WITHOUT CONTRAST TECHNIQUE: Multiplanar, multiecho pulse sequences of the brain and surrounding structures were obtained without intravenous contrast. Angiographic images of the head were obtained using MRA technique without contrast. COMPARISON:  Prior CT from earlier the same day. FINDINGS: MRI HEAD FINDINGS Brain: Cerebral volume within normal limits for age. Minimal T2/FLAIR hyperintensity noted within the periventricular white matter, nonspecific, and felt to be within normal limits for age. 7 mm focus of diffusion abnormality seen at the mid-posterior left centrum semi ovale (series 5, image 80). No associated hemorrhage. No other evidence for acute or subacute ischemia. Gray-white matter differentiation otherwise maintained. No other areas of remote cortical infarction. No foci of susceptibility artifact to suggest acute or chronic intracranial hemorrhage. No mass lesion, midline shift or mass effect. No hydrocephalus. No extra-axial fluid collection. Pituitary gland suprasellar region normal. Midline structures intact. Vascular: Major intracranial vascular flow voids are maintained. Skull and upper cervical spine: Craniocervical junction normal. Bone  marrow signal intensity within normal limits. Hyperostosis frontalis interna noted. Scalp soft tissues unremarkable. Sinuses/Orbits: Patient status post bilateral ocular lens replacement. Globes orbital soft tissues demonstrate no acute finding. Paranasal sinuses are clear. No mastoid effusion. Inner ear structures normal. Other: None. MRA HEAD FINDINGS ANTERIOR CIRCULATION: Distal cervical segments of the internal carotid arteries are patent with symmetric antegrade flow. Petrous segments widely patent bilaterally. Scattered atheromatous irregularity within the cavernous/supraclinoid ICAs. No significant stenosis seen on the right. There is a short-segment severe stenosis at the anterior  genu of the cavernous left ICA (series 9, image 104). ICA termini well perfused. A1 segments patent bilaterally. Normal anterior communicating artery. Anterior cerebral arteries widely patent to their distal aspects. M1 segments widely patent bilaterally. Normal MCA bifurcations. Distal MCA branches well perfused and symmetric. Mild distal small vessel atheromatous irregularity. POSTERIOR CIRCULATION: Vertebral arteries patent to the vertebrobasilar junction without stenosis. Left vertebral artery dominant. Posterior inferior cerebral arteries patent bilaterally. Basilar patent to its distal aspect without stenosis. Superior cerebral arteries patent bilaterally. Both of the posterior cerebral arteries primarily supplied via the basilar and are well perfused to their distal aspects. No intracranial aneurysm. IMPRESSION: MRI HEAD IMPRESSION: 1. 7 mm acute ischemic nonhemorrhagic infarct involving the deep white matter of the mid-posterior left centrum semi ovale. 2. Otherwise normal brain MRI for age. MRA HEAD IMPRESSION: 1. Negative intracranial MRA for large vessel occlusion. 2. Short-segment severe cavernous left ICA stenosis. 3. Otherwise negative intracranial MRA. No other hemodynamically significant or correctable stenosis.  Electronically Signed   By: Jeannine Boga M.D.   On: 10/24/2018 06:59   Vas US Carotid (at [place] Only)  Result Date: 10/25/2018 Carotid Arterial Duplex Study Indications:  CVA. Risk Factors: Hypertension. Performing Technologist: Abram Sander RVS  Examination Guidelines: A complete evaluation includes B-mode imaging, spectral Doppler, color Doppler, and power Doppler as needed of all accessible portions of each vessel. Bilateral testing is considered an integral part of a complete examination. Limited examinations for reoccurring indications may be performed as noted.  Right Carotid Findings: +----------+--------+--------+--------+------------+--------+             PSV cm/s EDV cm/s Stenosis Describe     Comments  +----------+--------+--------+--------+------------+--------+  CCA Prox   110      19                heterogenous           +----------+--------+--------+--------+------------+--------+  CCA Distal 103      19                heterogenous           +----------+--------+--------+--------+------------+--------+  ICA Prox   73       17       1-39%    heterogenous           +----------+--------+--------+--------+------------+--------+  ICA Distal 106      24                                       +----------+--------+--------+--------+------------+--------+  ECA        94       6                                        +----------+--------+--------+--------+------------+--------+ +----------+--------+-------+--------+-------------------+             PSV cm/s EDV cms Describe Arm Pressure (mmHG)  +----------+--------+-------+--------+-------------------+  Subclavian 141                                            +----------+--------+-------+--------+-------------------+ +---------+--------+--+--------+--+---------+  Vertebral PSV cm/s 37 EDV cm/s 10 Antegrade  +---------+--------+--+--------+--+---------+  Left Carotid Findings: +----------+--------+--------+--------+------------+--------+  PSV cm/s EDV cm/s Stenosis Describe     Comments  +----------+--------+--------+--------+------------+--------+  CCA Prox   132      14                heterogenous           +----------+--------+--------+--------+------------+--------+  CCA Distal 100      17                heterogenous           +----------+--------+--------+--------+------------+--------+  ICA Prox   107      25       1-39%    heterogenous           +----------+--------+--------+--------+------------+--------+  ICA Distal 82       13                                       +----------+--------+--------+--------+------------+--------+  ECA        80                                                +----------+--------+--------+--------+------------+--------+ +----------+--------+--------+--------+-------------------+  Subclavian PSV cm/s EDV cm/s Describe Arm Pressure (mmHG)  +----------+--------+--------+--------+-------------------+             138                                             +----------+--------+--------+--------+-------------------+ +---------+--------+--+--------+--+---------+  Vertebral PSV cm/s 67 EDV cm/s 17 Antegrade  +---------+--------+--+--------+--+---------+  Summary: Right Carotid: Velocities in the right ICA are consistent with a 1-39% stenosis. Left Carotid: Velocities in the left ICA are consistent with a 1-39% stenosis. Vertebrals: Bilateral vertebral arteries demonstrate antegrade flow. *See table(s) above for measurements and observations.  Electronically signed by Antony Contras MD on 10/25/2018 at 12:23:35 PM.    Final     Assessment & Plan:   There are no diagnoses linked to this encounter.   No orders of the defined types were placed in this encounter.    Follow-up: No follow-ups on file.  Walker Kehr, MD

## 2018-11-03 NOTE — Assessment & Plan Note (Signed)
Plavix, ASA 

## 2018-11-23 ENCOUNTER — Other Ambulatory Visit: Payer: Self-pay

## 2018-11-23 ENCOUNTER — Telehealth: Payer: Self-pay | Admitting: Neurology

## 2018-11-23 ENCOUNTER — Ambulatory Visit (INDEPENDENT_AMBULATORY_CARE_PROVIDER_SITE_OTHER): Payer: Medicare Other | Admitting: Neurology

## 2018-11-23 ENCOUNTER — Encounter: Payer: Self-pay | Admitting: Neurology

## 2018-11-23 ENCOUNTER — Inpatient Hospital Stay: Payer: BLUE CROSS/BLUE SHIELD | Admitting: Adult Health

## 2018-11-23 VITALS — BP 126/71 | HR 70 | Temp 98.0°F | Ht 61.0 in | Wt 138.8 lb

## 2018-11-23 DIAGNOSIS — I6381 Other cerebral infarction due to occlusion or stenosis of small artery: Secondary | ICD-10-CM

## 2018-11-23 NOTE — Telephone Encounter (Signed)
Pt is requesting a refill of clopidogrel (PLAVIX) 75 MG tablet and pravastatin (PRAVACHOL) 20 MG tablet   , to be sent to [id number] - [place], [place].

## 2018-11-23 NOTE — Patient Instructions (Signed)
I had a long d/w Cynthia Garza and her daughterabout her  recent lacunarstroke, risk for recurrent stroke/TIAs, personally independently reviewed imaging studies and stroke evaluation results and answered questions.Continue Plavix 75 mg daily alone but stop aspirin   for secondary stroke prevention and maintain strict control of hypertension with blood pressure goal below 130/90, diabetes with hemoglobin A1c goal below 6.5% and lipids with LDL cholesterol goal below 70 mg/dL. I also advised the Cynthia Garza to eat a healthy diet with plenty of whole grains, cereals, fruits and vegetables, exercise regularly and maintain ideal body weight. Check f/u lipid profile in 2 weeks. Followup in the future with my nurse practitioner Janett Billow in 3 months or call earlier if necessary.  Stroke Prevention Some medical conditions and behaviors are associated with a higher chance of having a stroke. You can help prevent a stroke by making nutrition, lifestyle, and other changes, including managing any medical conditions you may have. What nutrition changes can be made?   Eat healthy foods. You can do this by: ? Choosing foods high in fiber, such as fresh fruits and vegetables and whole grains. ? Eating at least 5 or more servings of fruits and vegetables a day. Try to fill half of your plate at each meal with fruits and vegetables. ? Choosing lean protein foods, such as lean cuts of meat, poultry without skin, fish, tofu, beans, and nuts. ? Eating low-fat dairy products. ? Avoiding foods that are high in salt (sodium). This can help lower blood pressure. ? Avoiding foods that have saturated fat, trans fat, and cholesterol. This can help prevent high cholesterol. ? Avoiding processed and premade foods.  Follow your health care provider's specific guidelines for losing weight, controlling high blood pressure (hypertension), lowering high cholesterol, and managing diabetes. These may include: ? Reducing your daily calorie intake.  ? Limiting your daily sodium intake to 1,500 milligrams (mg). ? Using only healthy fats for cooking, such as olive oil, canola oil, or sunflower oil. ? Counting your daily carbohydrate intake. What lifestyle changes can be made?  Maintain a healthy weight. Talk to your health care provider about your ideal weight.  Get at least 30 minutes of moderate physical activity at least 5 days a week. Moderate activity includes brisk walking, biking, and swimming.  Do not use any products that contain nicotine or tobacco, such as cigarettes and e-cigarettes. If you need help quitting, ask your health care provider. It may also be helpful to avoid exposure to secondhand smoke.  Limit alcohol intake to no more than 1 drink a day for nonpregnant women and 2 drinks a day for men. One drink equals 12 oz of beer, 5 oz of wine, or 1 oz of hard liquor.  Stop any illegal drug use.  Avoid taking birth control pills. Talk to your health care provider about the risks of taking birth control pills if: ? You are over 75 years old. ? You smoke. ? You get migraines. ? You have ever had a blood clot. What other changes can be made?  Manage your cholesterol levels. ? Eating a healthy diet is important for preventing high cholesterol. If cholesterol cannot be managed through diet alone, you may also need to take medicines. ? Take any prescribed medicines to control your cholesterol as told by your health care provider.  Manage your diabetes. ? Eating a healthy diet and exercising regularly are important parts of managing your blood sugar. If your blood sugar cannot be managed through diet and exercise, you  may need to take medicines. ? Take any prescribed medicines to control your diabetes as told by your health care provider.  Control your hypertension. ? To reduce your risk of stroke, try to keep your blood pressure below 130/80. ? Eating a healthy diet and exercising regularly are an important part of  controlling your blood pressure. If your blood pressure cannot be managed through diet and exercise, you may need to take medicines. ? Take any prescribed medicines to control hypertension as told by your health care provider. ? Ask your health care provider if you should monitor your blood pressure at home. ? Have your blood pressure checked every year, even if your blood pressure is normal. Blood pressure increases with age and some medical conditions.  Get evaluated for sleep disorders (sleep apnea). Talk to your health care provider about getting a sleep evaluation if you snore a lot or have excessive sleepiness.  Take over-the-counter and prescription medicines only as told by your health care provider. Aspirin or blood thinners (antiplatelets or anticoagulants) may be recommended to reduce your risk of forming blood clots that can lead to stroke.  Make sure that any other medical conditions you have, such as atrial fibrillation or atherosclerosis, are managed. What are the warning signs of a stroke? The warning signs of a stroke can be easily remembered as BEFAST.  B is for balance. Signs include: ? Dizziness. ? Loss of balance or coordination. ? Sudden trouble walking.  E is for eyes. Signs include: ? A sudden change in vision. ? Trouble seeing.  F is for face. Signs include: ? Sudden weakness or numbness of the face. ? The face or eyelid drooping to one side.  A is for arms. Signs include: ? Sudden weakness or numbness of the arm, usually on one side of the body.  S is for speech. Signs include: ? Trouble speaking (aphasia). ? Trouble understanding.  T is for time. ? These symptoms may represent a serious problem that is an emergency. Do not wait to see if the symptoms will go away. Get medical help right away. Call your local emergency services (911 in the [place] Do not drive yourself to the hospital.  Other signs of stroke may include: ? A sudden, severe headache with  no known cause. ? Nausea or vomiting. ? Seizure. Where to find more information For more information, visit:  American Stroke Association: www.strokeassociation.org  National Stroke Association: www.stroke.org Summary  You can prevent a stroke by eating healthy, exercising, not smoking, limiting alcohol intake, and managing any medical conditions you may have.  Do not use any products that contain nicotine or tobacco, such as cigarettes and e-cigarettes. If you need help quitting, ask your health care provider. It may also be helpful to avoid exposure to secondhand smoke.  Remember BEFAST for warning signs of stroke. Get help right away if you or a loved one has any of these signs. This information is not intended to replace advice given to you by your health care provider. Make sure you discuss any questions you have with your health care provider. Document Released: 06/19/2004 Document Revised: 04/24/2017 Document Reviewed: 06/17/2016 Elsevier Cynthia Garza Education  2020 [employer].

## 2018-11-23 NOTE — Progress Notes (Signed)
[facility] [address] [place]. [place] [address] [phone]       OFFICE CONSULT NOTE  [name] Date of Birth:  1932-07-22 Medical Record Number:  [id number]   Referring MD: Rosalin Hawking Reason for Referral: Stroke HPI: [name] is a pleasant 85 year old Caucasian lady seen today for initial office consultation visit for stroke.  History is obtained from the patient, her daughter was accompanying her as well as review of electronic medical records and have personally reviewed imaging films in PACS.  She has past medical history of hypertension, hyperlipidemia, depression, gastroesophageal reflux disease osteoporosis who presented on 10/24/2018 with right-sided weakness which she noticed the night before.  She was preparing to go to sleep when she noticed that she had no control of her right upper extremity which was weak and heavy and numb.  She called her daughter and the daughter noticed that while coming to the hospital she was also dragging her right foot but the patient does not remember this.  She started getting better shortly after arrival to the hospital.  CT scan of the head was obtained which was unremarkable.  MRI scan of the brain showed a small 7 mm lacunar infarct in the left centrum semiovale mid posterior white matter.  Carotid ultrasound ultrasound showed no significant extracranial stenosis.  Transthoracic echo showed normal ejection fraction.  LDL cholesterol was elevated at 91 mg percent.  Hemoglobin A1c was 5.6.  MRI of the brain showed short segment severe stenosis of left cavernous carotid.  Patient was started on dual antiplatelet therapy and on protocol.  She states she is done well since discharge.  She has had no recurrent stroke or TIA symptoms.  Right-sided strength is returned back to normal.  She is still on aspirin and Plavix and tolerating them well without significant bleeding or bruising.  She is also tolerating Pravachol  without muscle aches and pains.  She has had no new neurological complaints.  She has no prior history of TIAs or strokes or other neurological problems.  ROS:   14 system review of systems is positive for no complaints today and all systems negative PMH:  Past Medical History:  Diagnosis Date   Anxiety    Breast cancer ([place]) 2015   ER+/PR+/Her2-   Depression    Dyslipidemia    GERD (gastroesophageal reflux disease)    Heart murmur    Hemangioma of intra-abdominal structures    HTN (hypertension)    Insomnia    LBP (low back pain)    Osteoporosis    Paresthesia    Radiation 08/08/13-08/29/13   Bilat.breast 42.72 Gy   Renal cyst    Shingles     Social History:  Social History   Socioeconomic History   Marital status: Married    Spouse name: Not on file   Number of children: 2   Years of education: Not on file   Highest education level: Not on file  Occupational History   Occupation: Retired    Fish farm manager: RETIRED    Comment: [place] resource strain: Not on file   Food insecurity    Worry: Not on file    Inability: Not on file   Transportation needs    Medical: Not on file    Non-medical: Not on file  Tobacco Use   Smoking status: Never Smoker   Smokeless tobacco: Never Used  Substance and Sexual Activity   Alcohol use: No  Drug use: No   Sexual activity: Yes    Comment: menarche age 58, first live birth [age] yrs of age, P2, menopause 57, no HRT  Lifestyle   Physical activity    Days per week: Not on file    Minutes per session: Not on file   Stress: Not on file  Relationships   Social connections    Talks on phone: Not on file    Gets together: Not on file    Attends religious service: Not on file    Active member of club or organization: Not on file    Attends meetings of clubs or organizations: Not on file    Relationship status: Not on file   Intimate partner violence    Fear of current  or ex partner: Not on file    Emotionally abused: Not on file    Physically abused: Not on file    Forced sexual activity: Not on file  Other Topics Concern   Not on file  Social History Narrative   Not on file    Medications:   Current Outpatient Medications on File Prior to Visit  Medication Sig Dispense Refill   amLODipine (NORVASC) 5 MG tablet TAKE 1 TABLET BY MOUTH DAILY. PATIENT NEEDS OFFICE VISIT BEFORE REFILLS WILL BE GIVEN (Patient taking differently: Take 5 mg by mouth daily. ) 90 tablet 0   anastrozole (ARIMIDEX) 1 MG tablet TAKE 1 TABLET BY MOUTH EVERY DAY (Patient taking differently: Take 1 mg by mouth daily. ) 90 tablet 3   buPROPion (WELLBUTRIN XL) 150 MG 24 hr tablet TAKE 1 TABLET BY MOUTH EVERY DAY (Patient taking differently: Take 150 mg by mouth daily. ) 90 tablet 3   clopidogrel (PLAVIX) 75 MG tablet Take 1 tablet (75 mg total) by mouth daily. 30 tablet 0   Famotidine (PEPCID PO) Take by mouth.     meclizine (ANTIVERT) 12.5 MG tablet Take 1 tablet (12.5 mg total) by mouth 2 (two) times daily as needed for dizziness. 60 tablet 2   Multiple Vitamins-Minerals (ALIVE ONCE DAILY WOMENS 50+) TABS Take 1 tablet by mouth daily.     Multiple Vitamins-Minerals (PRESERVISION AREDS 2 PO) Take 1 tablet by mouth 2 (two) times daily.     pravastatin (PRAVACHOL) 20 MG tablet Take 1 tablet (20 mg total) by mouth daily at 6 PM. 30 tablet 0   Propylene Glycol (SYSTANE BALANCE) 0.6 % SOLN Place 2 drops into both eyes at bedtime.     valsartan-hydrochlorothiazide (DIOVAN-HCT) 320-25 MG tablet TAKE 1 TABLET BY MOUTH DAILY. 90 tablet 3   vitamin B-12 (CYANOCOBALAMIN) 1000 MCG tablet Take 1,000 mcg by mouth daily.     zolpidem (AMBIEN) 10 MG tablet Take 1 tablet (10 mg total) by mouth at bedtime as needed for sleep. 90 tablet 1   No current facility-administered medications on file prior to visit.     Allergies:   Allergies  Allergen Reactions   Cholestoff [Plant Sterols  And Stanols]     unknown   Lexapro [Escitalopram Oxalate]     fatigue   Simvastatin Other (See Comments)    aches   Diflucan [Fluconazole] Rash    Physical Exam General: Pleasant frail elderly Caucasian lady, seated, in no evident distress Head: head normocephalic and atraumatic.   Neck: supple with no carotid or supraclavicular bruits Cardiovascular: regular rate and rhythm, no murmurs Musculoskeletal: no deformity Skin:  no rash/petichiae Vascular:  Normal pulses all extremities  Neurologic Exam Mental Status: Awake and  fully alert. Oriented to place and time. Recent and remote memory intact. Attention span, concentration and fund of knowledge appropriate. Mood and affect appropriate.  Cranial Nerves: Fundoscopic exam reveals sharp disc margins. Pupils equal, briskly reactive to light. Extraocular movements full without nystagmus. Visual fields full to confrontation. Hearing mildly diminished bilaterally facial sensation intact. Face, tongue, palate moves normally and symmetrically.  Motor: Normal bulk and tone. Normal strength in all tested extremity muscles.  Diminished fine finger movements on the right.  Orbits left over right upper extremity.  Trace weakness of right grip only. Sensory.: intact to touch , pinprick , position and vibratory sensation.  Coordination: Rapid alternating movements normal in all extremities. Finger-to-nose and heel-to-shin performed accurately bilaterally. Gait and Station: Arises from chair without difficulty. Stance is normal. Gait demonstrates normal stride length and balance . Able to heel, toe and tandem walk without difficulty.  Reflexes: 1+ and symmetric. Toes downgoing.   NIHSS 0 Modified Rankin  0   ASSESSMENT: 85 year old lady with left brain subcortical lacunar infarct in May 2020 secondary to small vessel disease from which she has recovered quite well.  Vascular risk factors of age hypertension hyperlipidemia and cavernous carotid  stenosis     PLAN: I had a long d/w patient and her daughterabout her  recent lacunarstroke, risk for recurrent stroke/TIAs, personally independently reviewed imaging studies and stroke evaluation results and answered questions.Continue Plavix 75 mg daily alone but stop aspirin   for secondary stroke prevention and maintain strict control of hypertension with blood pressure goal below 130/90, diabetes with hemoglobin A1c goal below 6.5% and lipids with LDL cholesterol goal below 70 mg/dL. I also advised the patient to eat a healthy diet with plenty of whole grains, cereals, fruits and vegetables, exercise regularly and maintain ideal body weight. Check f/u lipid profile in 2 weeks.  Greater than 50% time during this 45-minute consultation visit was spent on counseling and coordination of care about recent lacunar stroke and discussion about a prevention and treatment plan and answering questions followup in the future with my nurse practitioner Janett Billow in 3 months or call earlier if necessary. Antony Contras, MD  [facility] [address] [place], [place] [address]  Phone [phone] Fax [phone] Note: This document was prepared with digital dictation and possible smart phrase technology. Any transcriptional errors that result from this process are unintentional.

## 2018-11-24 ENCOUNTER — Other Ambulatory Visit (HOSPITAL_COMMUNITY): Payer: Self-pay | Admitting: Neurology

## 2018-11-24 MED ORDER — PRAVASTATIN SODIUM 20 MG PO TABS: 20.0000 mg | ORAL_TABLET | Freq: Every day | ORAL | 3 refills | Status: DC

## 2018-11-24 MED ORDER — CLOPIDOGREL BISULFATE 75 MG PO TABS: 75.0000 mg | ORAL_TABLET | Freq: Every day | ORAL | 3 refills | Status: DC

## 2018-11-24 NOTE — Telephone Encounter (Signed)
Ok I will do so

## 2018-12-15 ENCOUNTER — Other Ambulatory Visit: Payer: Self-pay | Admitting: Internal Medicine

## 2018-12-23 ENCOUNTER — Other Ambulatory Visit: Payer: Self-pay | Admitting: Internal Medicine

## 2019-01-11 ENCOUNTER — Ambulatory Visit (INDEPENDENT_AMBULATORY_CARE_PROVIDER_SITE_OTHER): Payer: Medicare Other | Admitting: Internal Medicine

## 2019-01-11 ENCOUNTER — Other Ambulatory Visit: Payer: Self-pay

## 2019-01-11 ENCOUNTER — Telehealth: Payer: Self-pay | Admitting: Oncology

## 2019-01-11 ENCOUNTER — Encounter: Payer: Self-pay | Admitting: Internal Medicine

## 2019-01-11 DIAGNOSIS — I10 Essential (primary) hypertension: Secondary | ICD-10-CM | POA: Diagnosis not present

## 2019-01-11 DIAGNOSIS — C50411 Malignant neoplasm of upper-outer quadrant of right female breast: Secondary | ICD-10-CM

## 2019-01-11 DIAGNOSIS — Z170 Estrogen receptor positive status [ER+]: Secondary | ICD-10-CM | POA: Diagnosis not present

## 2019-01-11 DIAGNOSIS — F419 Anxiety disorder, unspecified: Secondary | ICD-10-CM | POA: Diagnosis not present

## 2019-01-11 DIAGNOSIS — E785 Hyperlipidemia, unspecified: Secondary | ICD-10-CM

## 2019-01-11 DIAGNOSIS — I6381 Other cerebral infarction due to occlusion or stenosis of small artery: Secondary | ICD-10-CM | POA: Diagnosis not present

## 2019-01-11 MED ORDER — OLMESARTAN MEDOXOMIL-HCTZ 40-25 MG PO TABS: 1.0000 | ORAL_TABLET | Freq: Every day | ORAL | 3 refills | Status: DC

## 2019-01-11 NOTE — Progress Notes (Signed)
Subjective:  Patient ID: Cynthia Garza, female    DOB: [birth year]  Age: 85 y.o. MRN: [id number]  CC: No chief complaint on file.   HPI MAMMIE MERAS presents for CVA, HTN, depression f/u  Outpatient Medications Prior to Visit  Medication Sig Dispense Refill   amLODipine (NORVASC) 5 MG tablet Take 1 tablet (5 mg total) by mouth daily. 90 tablet 1   anastrozole (ARIMIDEX) 1 MG tablet TAKE 1 TABLET BY MOUTH EVERY DAY (Patient taking differently: Take 1 mg by mouth daily. ) 90 tablet 3   buPROPion (WELLBUTRIN XL) 150 MG 24 hr tablet TAKE 1 TABLET BY MOUTH EVERY DAY 90 tablet 3   clopidogrel (PLAVIX) 75 MG tablet Take 1 tablet (75 mg total) by mouth daily. 30 tablet 3   Famotidine (PEPCID PO) Take by mouth.     meclizine (ANTIVERT) 12.5 MG tablet Take 1 tablet (12.5 mg total) by mouth 2 (two) times daily as needed for dizziness. 60 tablet 2   Multiple Vitamins-Minerals (ALIVE ONCE DAILY WOMENS 50+) TABS Take 1 tablet by mouth daily.     Multiple Vitamins-Minerals (PRESERVISION AREDS 2 PO) Take 1 tablet by mouth 2 (two) times daily.     pravastatin (PRAVACHOL) 20 MG tablet Take 1 tablet (20 mg total) by mouth daily at 6 PM. 30 tablet 3   Propylene Glycol (SYSTANE BALANCE) 0.6 % SOLN Place 2 drops into both eyes at bedtime.     valsartan-hydrochlorothiazide (DIOVAN-HCT) 320-25 MG tablet TAKE 1 TABLET BY MOUTH DAILY. 90 tablet 3   vitamin B-12 (CYANOCOBALAMIN) 1000 MCG tablet Take 1,000 mcg by mouth daily.     zolpidem (AMBIEN) 10 MG tablet Take 1 tablet (10 mg total) by mouth at bedtime as needed for sleep. 90 tablet 1   No facility-administered medications prior to visit.     ROS: Review of Systems  Constitutional: Negative for activity change, appetite change, chills, fatigue and unexpected weight change.  HENT: Negative for congestion, mouth sores and sinus pressure.   Eyes: Negative for visual disturbance.  Respiratory: Negative for cough and chest tightness.     Gastrointestinal: Negative for abdominal pain and nausea.  Genitourinary: Negative for difficulty urinating, frequency and vaginal pain.  Musculoskeletal: Negative for back pain and gait problem.  Skin: Negative for pallor and rash.  Neurological: Negative for dizziness, tremors, weakness, numbness and headaches.  Psychiatric/Behavioral: Negative for confusion, sleep disturbance and suicidal ideas. The patient is nervous/anxious.     Objective:  BP (!) 148/70 (BP Location: Left Arm, Patient Position: Sitting, Cuff Size: Normal)    Pulse 75    Temp 97.9 F (36.6 C) (Oral)    Ht 5\' 1"  (1.549 m)    Wt 137 lb (62.1 kg)    SpO2 97%    BMI 25.89 kg/m   BP Readings from Last 3 Encounters:  01/11/19 (!) 148/70  11/23/18 126/71  11/03/18 126/70    Wt Readings from Last 3 Encounters:  01/11/19 137 lb (62.1 kg)  11/23/18 138 lb 12.8 oz (63 kg)  11/03/18 136 lb (61.7 kg)    Physical Exam Constitutional:      General: She is not in acute distress.    Appearance: She is well-developed.  HENT:     Head: Normocephalic.     Right Ear: External ear normal.     Left Ear: External ear normal.     Nose: Nose normal.  Eyes:     General:  Right eye: No discharge.        Left eye: No discharge.     Conjunctiva/sclera: Conjunctivae normal.     Pupils: Pupils are equal, round, and reactive to light.  Neck:     Musculoskeletal: Normal range of motion and neck supple.     Thyroid: No thyromegaly.     Vascular: No JVD.     Trachea: No tracheal deviation.  Cardiovascular:     Rate and Rhythm: Normal rate and regular rhythm.     Heart sounds: Normal heart sounds.  Pulmonary:     Effort: No respiratory distress.     Breath sounds: No stridor. No wheezing.  Abdominal:     General: Bowel sounds are normal. There is no distension.     Palpations: Abdomen is soft. There is no mass.     Tenderness: There is no abdominal tenderness. There is no guarding or rebound.  Musculoskeletal:         General: No tenderness.  Lymphadenopathy:     Cervical: No cervical adenopathy.  Skin:    Findings: No erythema or rash.  Neurological:     Mental Status: She is oriented to person, place, and time.     Cranial Nerves: No cranial nerve deficit.     Motor: No abnormal muscle tone.     Coordination: Coordination normal.     Deep Tendon Reflexes: Reflexes normal.  Psychiatric:        Behavior: Behavior normal.        Thought Content: Thought content normal.        Judgment: Judgment normal.   sad  Lab Results  Component Value Date   WBC 7.9 10/24/2018   HGB 12.9 10/24/2018   HCT 38.0 10/24/2018   PLT 206 10/24/2018   GLUCOSE 99 11/03/2018   CHOL 206 (H) 10/24/2018   TRIG 299 (H) 10/24/2018   HDL 55 10/24/2018   LDLDIRECT 144.7 02/09/2012   LDLCALC 91 10/24/2018   ALT 11 10/24/2018   AST 22 10/24/2018   NA 140 11/03/2018   K 3.5 11/03/2018   CL 103 11/03/2018   CREATININE 0.99 11/03/2018   BUN 23 11/03/2018   CO2 29 11/03/2018   TSH 1.97 12/18/2015   INR 1.0 10/24/2018   HGBA1C 5.6 10/24/2018    Ct Head Wo Contrast  Result Date: 10/24/2018 CLINICAL DATA:  Right upper extremity heaviness and lower extremity numbness EXAM: CT HEAD WITHOUT CONTRAST TECHNIQUE: Contiguous axial images were obtained from the base of the skull through the vertex without intravenous contrast. COMPARISON:  None. FINDINGS: Brain: No acute territorial infarction, hemorrhage, or intracranial mass. The ventricles are nonenlarged. Vascular: No hyperdense vessels.  Carotid vascular calcification. Skull: Normal. Negative for fracture or focal lesion. Sinuses/Orbits: No acute finding. Other: Chronic appearing abnormality at the anterior arch of C1, incompletely visualized. Prominent degenerative changes at the C1-C2 articulation. IMPRESSION: Negative non contrasted CT appearance of the brain.  Mild atrophy Electronically Signed   By: Donavan Foil M.D.   On: 10/24/2018 01:00   Mr Brain Wo Contrast  Result  Date: 10/24/2018 CLINICAL DATA:  Initial evaluation for new onset right-sided arm and leg weakness. EXAM: MRI HEAD WITHOUT CONTRAST MRA HEAD WITHOUT CONTRAST TECHNIQUE: Multiplanar, multiecho pulse sequences of the brain and surrounding structures were obtained without intravenous contrast. Angiographic images of the head were obtained using MRA technique without contrast. COMPARISON:  Prior CT from earlier the same day. FINDINGS: MRI HEAD FINDINGS Brain: Cerebral volume within normal limits for  age. Minimal T2/FLAIR hyperintensity noted within the periventricular white matter, nonspecific, and felt to be within normal limits for age. 7 mm focus of diffusion abnormality seen at the mid-posterior left centrum semi ovale (series 5, image 80). No associated hemorrhage. No other evidence for acute or subacute ischemia. Gray-white matter differentiation otherwise maintained. No other areas of remote cortical infarction. No foci of susceptibility artifact to suggest acute or chronic intracranial hemorrhage. No mass lesion, midline shift or mass effect. No hydrocephalus. No extra-axial fluid collection. Pituitary gland suprasellar region normal. Midline structures intact. Vascular: Major intracranial vascular flow voids are maintained. Skull and upper cervical spine: Craniocervical junction normal. Bone marrow signal intensity within normal limits. Hyperostosis frontalis interna noted. Scalp soft tissues unremarkable. Sinuses/Orbits: Patient status post bilateral ocular lens replacement. Globes orbital soft tissues demonstrate no acute finding. Paranasal sinuses are clear. No mastoid effusion. Inner ear structures normal. Other: None. MRA HEAD FINDINGS ANTERIOR CIRCULATION: Distal cervical segments of the internal carotid arteries are patent with symmetric antegrade flow. Petrous segments widely patent bilaterally. Scattered atheromatous irregularity within the cavernous/supraclinoid ICAs. No significant stenosis seen on  the right. There is a short-segment severe stenosis at the anterior genu of the cavernous left ICA (series 9, image 104). ICA termini well perfused. A1 segments patent bilaterally. Normal anterior communicating artery. Anterior cerebral arteries widely patent to their distal aspects. M1 segments widely patent bilaterally. Normal MCA bifurcations. Distal MCA branches well perfused and symmetric. Mild distal small vessel atheromatous irregularity. POSTERIOR CIRCULATION: Vertebral arteries patent to the vertebrobasilar junction without stenosis. Left vertebral artery dominant. Posterior inferior cerebral arteries patent bilaterally. Basilar patent to its distal aspect without stenosis. Superior cerebral arteries patent bilaterally. Both of the posterior cerebral arteries primarily supplied via the basilar and are well perfused to their distal aspects. No intracranial aneurysm. IMPRESSION: MRI HEAD IMPRESSION: 1. 7 mm acute ischemic nonhemorrhagic infarct involving the deep white matter of the mid-posterior left centrum semi ovale. 2. Otherwise normal brain MRI for age. MRA HEAD IMPRESSION: 1. Negative intracranial MRA for large vessel occlusion. 2. Short-segment severe cavernous left ICA stenosis. 3. Otherwise negative intracranial MRA. No other hemodynamically significant or correctable stenosis. Electronically Signed   By: Jeannine Boga M.D.   On: 10/24/2018 06:59   Dg Chest Portable 1 View  Result Date: 10/24/2018 CLINICAL DATA:  TIA EXAM: PORTABLE CHEST 1 VIEW COMPARISON:  08/14/2017 FINDINGS: The heart size and mediastinal contours are within normal limits. Both lungs are clear. Surgical clips over the axilla bilaterally. Aortic atherosclerosis. IMPRESSION: No active disease. Electronically Signed   By: Donavan Foil M.D.   On: 10/24/2018 00:43   Mr Virgel Paling PT Contrast  Result Date: 10/24/2018 CLINICAL DATA:  Initial evaluation for new onset right-sided arm and leg weakness. EXAM: MRI HEAD WITHOUT  CONTRAST MRA HEAD WITHOUT CONTRAST TECHNIQUE: Multiplanar, multiecho pulse sequences of the brain and surrounding structures were obtained without intravenous contrast. Angiographic images of the head were obtained using MRA technique without contrast. COMPARISON:  Prior CT from earlier the same day. FINDINGS: MRI HEAD FINDINGS Brain: Cerebral volume within normal limits for age. Minimal T2/FLAIR hyperintensity noted within the periventricular white matter, nonspecific, and felt to be within normal limits for age. 7 mm focus of diffusion abnormality seen at the mid-posterior left centrum semi ovale (series 5, image 80). No associated hemorrhage. No other evidence for acute or subacute ischemia. Gray-white matter differentiation otherwise maintained. No other areas of remote cortical infarction. No foci of susceptibility artifact to suggest acute  or chronic intracranial hemorrhage. No mass lesion, midline shift or mass effect. No hydrocephalus. No extra-axial fluid collection. Pituitary gland suprasellar region normal. Midline structures intact. Vascular: Major intracranial vascular flow voids are maintained. Skull and upper cervical spine: Craniocervical junction normal. Bone marrow signal intensity within normal limits. Hyperostosis frontalis interna noted. Scalp soft tissues unremarkable. Sinuses/Orbits: Patient status post bilateral ocular lens replacement. Globes orbital soft tissues demonstrate no acute finding. Paranasal sinuses are clear. No mastoid effusion. Inner ear structures normal. Other: None. MRA HEAD FINDINGS ANTERIOR CIRCULATION: Distal cervical segments of the internal carotid arteries are patent with symmetric antegrade flow. Petrous segments widely patent bilaterally. Scattered atheromatous irregularity within the cavernous/supraclinoid ICAs. No significant stenosis seen on the right. There is a short-segment severe stenosis at the anterior genu of the cavernous left ICA (series 9, image 104).  ICA termini well perfused. A1 segments patent bilaterally. Normal anterior communicating artery. Anterior cerebral arteries widely patent to their distal aspects. M1 segments widely patent bilaterally. Normal MCA bifurcations. Distal MCA branches well perfused and symmetric. Mild distal small vessel atheromatous irregularity. POSTERIOR CIRCULATION: Vertebral arteries patent to the vertebrobasilar junction without stenosis. Left vertebral artery dominant. Posterior inferior cerebral arteries patent bilaterally. Basilar patent to its distal aspect without stenosis. Superior cerebral arteries patent bilaterally. Both of the posterior cerebral arteries primarily supplied via the basilar and are well perfused to their distal aspects. No intracranial aneurysm. IMPRESSION: MRI HEAD IMPRESSION: 1. 7 mm acute ischemic nonhemorrhagic infarct involving the deep white matter of the mid-posterior left centrum semi ovale. 2. Otherwise normal brain MRI for age. MRA HEAD IMPRESSION: 1. Negative intracranial MRA for large vessel occlusion. 2. Short-segment severe cavernous left ICA stenosis. 3. Otherwise negative intracranial MRA. No other hemodynamically significant or correctable stenosis. Electronically Signed   By: Jeannine Boga M.D.   On: 10/24/2018 06:59   Vas US Carotid (at [place] Only)  Result Date: 10/25/2018 Carotid Arterial Duplex Study Indications:  CVA. Risk Factors: Hypertension. Performing Technologist: Abram Sander RVS  Examination Guidelines: A complete evaluation includes B-mode imaging, spectral Doppler, color Doppler, and power Doppler as needed of all accessible portions of each vessel. Bilateral testing is considered an integral part of a complete examination. Limited examinations for reoccurring indications may be performed as noted.  Right Carotid Findings: +----------+--------+--------+--------+------------+--------+             PSV cm/s EDV cm/s Stenosis Describe     Comments   +----------+--------+--------+--------+------------+--------+  CCA Prox   110      19                heterogenous           +----------+--------+--------+--------+------------+--------+  CCA Distal 103      19                heterogenous           +----------+--------+--------+--------+------------+--------+  ICA Prox   73       17       1-39%    heterogenous           +----------+--------+--------+--------+------------+--------+  ICA Distal 106      24                                       +----------+--------+--------+--------+------------+--------+  ECA        94       6                                        +----------+--------+--------+--------+------------+--------+ +----------+--------+-------+--------+-------------------+  PSV cm/s EDV cms Describe Arm Pressure (mmHG)  +----------+--------+-------+--------+-------------------+  Subclavian 141                                            +----------+--------+-------+--------+-------------------+ +---------+--------+--+--------+--+---------+  Vertebral PSV cm/s 37 EDV cm/s 10 Antegrade  +---------+--------+--+--------+--+---------+  Left Carotid Findings: +----------+--------+--------+--------+------------+--------+             PSV cm/s EDV cm/s Stenosis Describe     Comments  +----------+--------+--------+--------+------------+--------+  CCA Prox   132      14                heterogenous           +----------+--------+--------+--------+------------+--------+  CCA Distal 100      17                heterogenous           +----------+--------+--------+--------+------------+--------+  ICA Prox   107      25       1-39%    heterogenous           +----------+--------+--------+--------+------------+--------+  ICA Distal 82       13                                       +----------+--------+--------+--------+------------+--------+  ECA        80                                                +----------+--------+--------+--------+------------+--------+  +----------+--------+--------+--------+-------------------+  Subclavian PSV cm/s EDV cm/s Describe Arm Pressure (mmHG)  +----------+--------+--------+--------+-------------------+             138                                             +----------+--------+--------+--------+-------------------+ +---------+--------+--+--------+--+---------+  Vertebral PSV cm/s 67 EDV cm/s 17 Antegrade  +---------+--------+--+--------+--+---------+  Summary: Right Carotid: Velocities in the right ICA are consistent with a 1-39% stenosis. Left Carotid: Velocities in the left ICA are consistent with a 1-39% stenosis. Vertebrals: Bilateral vertebral arteries demonstrate antegrade flow. *See table(s) above for measurements and observations.  Electronically signed by Antony Contras MD on 10/25/2018 at 12:23:35 PM.    Final     Assessment & Plan:   There are no diagnoses linked to this encounter.   No orders of the defined types were placed in this encounter.    Follow-up: No follow-ups on file.  Walker Kehr, MD

## 2019-01-11 NOTE — Assessment & Plan Note (Addendum)
Lorazepam prn  Potential benefits of a long term benzodiazepines  use as well as potential risks  and complications were explained to the patient and were aknowledged. Stress discussed. I suggested she goes to the beach for her birthday

## 2019-01-11 NOTE — Assessment & Plan Note (Signed)
F/u w/Oncology 

## 2019-01-11 NOTE — Assessment & Plan Note (Signed)
Benicar HCT, Amlodipine

## 2019-01-11 NOTE — Telephone Encounter (Signed)
No problem-- OK in person

## 2019-01-11 NOTE — Telephone Encounter (Signed)
Called patient regarding upcoming [facility] appointment, patient would prefer this to be a walk-in visit.   Message to provider.

## 2019-01-11 NOTE — Assessment & Plan Note (Addendum)
Pravastatin  

## 2019-01-12 ENCOUNTER — Inpatient Hospital Stay: Payer: Medicare Other | Attending: Oncology | Admitting: Oncology

## 2019-01-12 ENCOUNTER — Other Ambulatory Visit: Payer: Self-pay

## 2019-01-12 ENCOUNTER — Other Ambulatory Visit: Payer: Medicare Other

## 2019-01-12 VITALS — BP 139/55 | HR 76 | Temp 98.9°F | Ht 61.0 in | Wt 138.9 lb

## 2019-01-12 DIAGNOSIS — Z79899 Other long term (current) drug therapy: Secondary | ICD-10-CM | POA: Insufficient documentation

## 2019-01-12 DIAGNOSIS — Z79811 Long term (current) use of aromatase inhibitors: Secondary | ICD-10-CM | POA: Diagnosis not present

## 2019-01-12 DIAGNOSIS — C50411 Malignant neoplasm of upper-outer quadrant of right female breast: Secondary | ICD-10-CM | POA: Diagnosis not present

## 2019-01-12 DIAGNOSIS — Z170 Estrogen receptor positive status [ER+]: Secondary | ICD-10-CM | POA: Insufficient documentation

## 2019-01-12 DIAGNOSIS — F419 Anxiety disorder, unspecified: Secondary | ICD-10-CM | POA: Insufficient documentation

## 2019-01-12 DIAGNOSIS — C50911 Malignant neoplasm of unspecified site of right female breast: Secondary | ICD-10-CM | POA: Insufficient documentation

## 2019-01-12 DIAGNOSIS — I10 Essential (primary) hypertension: Secondary | ICD-10-CM | POA: Insufficient documentation

## 2019-01-12 DIAGNOSIS — Z8673 Personal history of transient ischemic attack (TIA), and cerebral infarction without residual deficits: Secondary | ICD-10-CM | POA: Insufficient documentation

## 2019-01-12 DIAGNOSIS — D0512 Intraductal carcinoma in situ of left breast: Secondary | ICD-10-CM | POA: Diagnosis not present

## 2019-01-12 DIAGNOSIS — Z90710 Acquired absence of both cervix and uterus: Secondary | ICD-10-CM | POA: Diagnosis not present

## 2019-01-12 DIAGNOSIS — I6381 Other cerebral infarction due to occlusion or stenosis of small artery: Secondary | ICD-10-CM | POA: Diagnosis not present

## 2019-01-12 DIAGNOSIS — E785 Hyperlipidemia, unspecified: Secondary | ICD-10-CM | POA: Insufficient documentation

## 2019-01-12 NOTE — Progress Notes (Signed)
[place]  [phone] Fax:([phone]     ID: Cynthia Garza OB: 02/05/1933  MR#: [id number]  FYB#:[id number]  Patient Care Team: Cassandria Anger, MD as PCP - General (Internal Medicine) , Virgie Dad, MD as Consulting Physician (Oncology) Jovita Kussmaul, MD as Consulting Physician (General Surgery) Thea Silversmith, MD as Referring Physician (Radiation Oncology)  OTHER MD:   CHIEF COMPLAINT: estrogen receptor positive breast cancer  CURRENT TREATMENT: Completing 5 years of anastrozole   INTERVAL HISTORY: Cynthia Garza is seen today for follow-up and treatment of her estrogen receptor positive breast cancer. She was last seen on 09/16/2018.  I gave her the option of doing this visit virtually but she wanted to graduate in person.  She continues on anastrozole.  She tolerates this well.  She cannot tell me any side effects that she thinks she has had due to this medication.  Cynthia Garza's last bone density screening on 03/30/2018, showed a T-score of -0.8, which is considered normal.    Since her last visit here, she "had a stroke".  She underwent a head CT without contrast on 10/24/2018 for right upper extremity heaviness and lower extremity numbness showing: Negative non contrasted CT appearance of the brain.  Mild atrophy.  A head MRI and MRA without contrast on 10/24/2018 showed: 7 mm acute ischemic nonhemorrhagic infarct involving the deep white matter of the mid-posterior left centrum semi ovale. Otherwise normal brain MRI for age. Negative intracranial MRA for large vessel occlusion. Short-segment severe cavernous left ICA stenosis. Otherwise negative intracranial MRA. No other hemodynamically significant or correctable stenosis.  Finally, she underwent an echocardiogram on 10/24/2018 showing an ejection fraction greater than 65% w unremarkable valves..   She was started on aspirin and transitioned to Plavix which she is tolerating well.    REVIEW OF SYSTEMS: Cynthia Garza does not like the pandemic she says.  She really wants to be able to get around more.  Her family is very attentive, her granddaughter calling her daily and her daughters wanting to make sure that she stays safe.  She has "fully recovered" her arm use, and she has her normal functional status, cooking every day and blaming her minor weight gain on that.  Detailed review of systems today was otherwise noncontributory  BREAST CANCER HISTORY: From Dr.Khan's 06/08/2013 note:  "1. [The patient] underwent a six-month followup mammogram for left-sided abnormalities. On her mammogram she was noted to have a right breast mass at the 12:00 position. By ultrasound it measured 1.5 cm. Patient went on to have a biopsy performed of this mass. This revealed intermediate grade invasive ductal carcinoma ER positive PR positive HER-2/neu negative with a proliferation marker Ki-67 22%. She had MRI of the breasts performed. The MRI showed a 1.3 cm enhancement known for the by biopsy with 2 small satellite lesion. She was also noted to have a another linear enhancement as well in the right breast. On the left side she was noted to have a 7 mm mass by ultrasound this measured 8 mm this was at the 7:00 position. Patient had a biopsy of this performed. By pathology report report this is consistent with an invasive cancer.   2. status post lumpectomy performed on 07/07/2013 of the left breast. The final pathology revealed a 0.7 cm invasive ductal carcinoma with ductal carcinoma in situ. Sentinel lymph node was negative for metastatic disease. Tumor was ER positive PR positive HER-2/neu negative with a proliferation marker Ki-67 13%. Stage I (T1 N0)  #3  patient had Oncotype DX testing performed her breast cancer recurrence score was 0 giving her a 3% risk of distant recurrence with tamoxifen for 5 years. Patient will not need chemotherapy but will need antiestrogen therapy"  Her subsequent history is  as detailed below.   PAST MEDICAL HISTORY: Past Medical History:  Diagnosis Date  . Anxiety   . Breast cancer ([place]) 2015   ER+/PR+/Her2-  . Depression   . Dyslipidemia   . GERD (gastroesophageal reflux disease)   . Heart murmur   . Hemangioma of intra-abdominal structures   . HTN (hypertension)   . Insomnia   . LBP (low back pain)   . Osteoporosis   . Paresthesia   . Radiation 08/08/13-08/29/13   Bilat.breast 42.72 Gy  . Renal cyst   . Shingles     PAST SURGICAL HISTORY: Past Surgical History:  Procedure Laterality Date  . ABDOMINAL HYSTERECTOMY     ?1970's  . BREAST LUMPECTOMY WITH NEEDLE LOCALIZATION AND AXILLARY SENTINEL LYMPH NODE BX Bilateral 07/07/2013   Procedure: BREAST LUMPECTOMY WITH NEEDLE LOCALIZATION AND AXILLARY SENTINEL LYMPH NODE BIOPIES;  Surgeon: Merrie Roof, MD;  Location: [facility];  Service: General;  Laterality: Bilateral;    FAMILY HISTORY Family History  Problem Relation Age of Onset  . Kidney failure Mother   . Hypertension Father   . Esophageal cancer Father   . Coronary artery disease Neg Hx     GYNECOLOGIC HISTORY:  Menarche age 56, first live birth age 69, she is [place] P2. She underwent menopause in the late 1970s. She did not take hormone replacement.   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 Her husband Cynthia Garza used to work in Actuary. The patient's daughter Cynthia Garza used to be a Education officer, museum but now works in Engineer, mining as. Daughter Cynthia Garza is a homemaker. The patient has 4 grandchildren. She attends a local [name]: [employer]   HEALTH MAINTENANCE: Social History   Tobacco Use  . Smoking status: Never Smoker  . Smokeless tobacco: Never Used  Substance Use Topics  . Alcohol use: No  . Drug use: No     Colonoscopy:  PAP:  Bone density:  Lipid panel:  Allergies  Allergen Reactions  . Cholestoff [Plant Sterols And Stanols]      unknown  . Lexapro [Escitalopram Oxalate]     fatigue  . Simvastatin Other (See Comments)    aches  . Diflucan [Fluconazole] Rash    Current Outpatient Medications  Medication Sig Dispense Refill  . amLODipine (NORVASC) 5 MG tablet Take 1 tablet (5 mg total) by mouth daily. 90 tablet 1  . anastrozole (ARIMIDEX) 1 MG tablet TAKE 1 TABLET BY MOUTH EVERY DAY (Patient taking differently: Take 1 mg by mouth daily. ) 90 tablet 3  . buPROPion (WELLBUTRIN XL) 150 MG 24 hr tablet TAKE 1 TABLET BY MOUTH EVERY DAY 90 tablet 3  . clopidogrel (PLAVIX) 75 MG tablet Take 1 tablet (75 mg total) by mouth daily. 30 tablet 3  . Famotidine (PEPCID PO) Take by mouth.    . meclizine (ANTIVERT) 12.5 MG tablet Take 1 tablet (12.5 mg total) by mouth 2 (two) times daily as needed for dizziness. 60 tablet 2  . Multiple Vitamins-Minerals (ALIVE ONCE DAILY WOMENS 50+) TABS Take 1 tablet by mouth daily.    . Multiple Vitamins-Minerals (PRESERVISION AREDS 2 PO) Take 1 tablet by mouth  2 (two) times daily.    Marland Kitchen olmesartan-hydrochlorothiazide (BENICAR HCT) 40-25 MG tablet Take 1 tablet by mouth daily. 90 tablet 3  . pravastatin (PRAVACHOL) 20 MG tablet Take 1 tablet (20 mg total) by mouth daily at 6 PM. 30 tablet 3  . Propylene Glycol (SYSTANE BALANCE) 0.6 % SOLN Place 2 drops into both eyes at bedtime.    . vitamin B-12 (CYANOCOBALAMIN) 1000 MCG tablet Take 1,000 mcg by mouth daily.    Marland Kitchen zolpidem (AMBIEN) 10 MG tablet Take 1 tablet (10 mg total) by mouth at bedtime as needed for sleep. 90 tablet 1   No current facility-administered medications for this visit.     OBJECTIVE: Older white woman who appears stated age  31:   01/12/19 1515  BP: (!) 139/55  Pulse: 76  Temp: 98.9 F (37.2 C)  SpO2: 99%     Body mass index is 26.24 kg/m.    ECOG FS:1 - Symptomatic but completely ambulatory  Sclerae unicteric, EOMs intact Wearing a mask No cervical or supraclavicular adenopathy Lungs no rales or rhonchi  Heart regular rate and rhythm Abd soft, nontender, positive bowel sounds MSK no focal spinal tenderness, no upper extremity lymphedema Neuro: nonfocal, well oriented, appropriate affect Breasts: She is status post bilateral lumpectomies and bilateral breast irradiation.  I do not find any evidence of disease recurrence.  Both axillae are benign.   LAB RESULTS:  CMP     Component Value Date/Time   NA 140 11/03/2018 1508   NA 142 07/22/2016 1351   K 3.5 11/03/2018 1508   K 3.7 07/22/2016 1351   CL 103 11/03/2018 1508   CO2 29 11/03/2018 1508   CO2 27 07/22/2016 1351   GLUCOSE 99 11/03/2018 1508   GLUCOSE 132 07/22/2016 1351   BUN 23 11/03/2018 1508   BUN 18.7 07/22/2016 1351   CREATININE 0.99 11/03/2018 1508   CREATININE 1.0 07/22/2016 1351   CALCIUM 9.5 11/03/2018 1508   CALCIUM 9.5 07/22/2016 1351   PROT 6.4 (L) 10/24/2018 0025   PROT 6.8 07/22/2016 1351   ALBUMIN 3.9 10/24/2018 0025   ALBUMIN 4.0 07/22/2016 1351   AST 22 10/24/2018 0025   AST 15 07/22/2016 1351   ALT 11 10/24/2018 0025   ALT 12 07/22/2016 1351   ALKPHOS 56 10/24/2018 0025   ALKPHOS 70 07/22/2016 1351   BILITOT 0.5 10/24/2018 0025   BILITOT 0.52 07/22/2016 1351   GFRNONAA 51 (L) 10/24/2018 0025   GFRAA 59 (L) 10/24/2018 0025    I No results found for: SPEP  Lab Results  Component Value Date   WBC 7.9 10/24/2018   NEUTROABS 4.4 10/24/2018   HGB 12.9 10/24/2018   HCT 38.0 10/24/2018   MCV 85.2 10/24/2018   PLT 206 10/24/2018      Chemistry      Component Value Date/Time   NA 140 11/03/2018 1508   NA 142 07/22/2016 1351   K 3.5 11/03/2018 1508   K 3.7 07/22/2016 1351   CL 103 11/03/2018 1508   CO2 29 11/03/2018 1508   CO2 27 07/22/2016 1351   BUN 23 11/03/2018 1508   BUN 18.7 07/22/2016 1351   CREATININE 0.99 11/03/2018 1508   CREATININE 1.0 07/22/2016 1351      Component Value Date/Time   CALCIUM 9.5 11/03/2018 1508   CALCIUM 9.5 07/22/2016 1351   ALKPHOS 56 10/24/2018 0025    ALKPHOS 70 07/22/2016 1351   AST 22 10/24/2018 0025   AST 15 07/22/2016 1351  ALT 11 10/24/2018 0025   ALT 12 07/22/2016 1351   BILITOT 0.5 10/24/2018 0025   BILITOT 0.52 07/22/2016 1351       No results found for: LABCA2  No components found for: LABCA125  No results for input(s): INR in the last 168 hours.  Urinalysis    Component Value Date/Time   COLORURINE YELLOW 12/18/2015 0936   APPEARANCEUR CLEAR 12/18/2015 0936   LABSPEC 1.010 12/18/2015 0936   PHURINE 7.0 12/18/2015 0936   GLUCOSEU NEGATIVE 12/18/2015 0936   HGBUR NEGATIVE 12/18/2015 0936   BILIRUBINUR negative 10/31/2017 1215   BILIRUBINUR neg 12/03/2011 1540   KETONESUR negative 10/31/2017 1215   KETONESUR NEGATIVE 12/18/2015 0936   PROTEINUR negative 10/31/2017 1215   PROTEINUR 100 (A) 12/04/2011 1640   UROBILINOGEN 0.2 10/31/2017 1215   UROBILINOGEN 0.2 12/18/2015 0936   NITRITE Negative 10/31/2017 1215   NITRITE NEGATIVE 12/18/2015 0936   LEUKOCYTESUR Negative 10/31/2017 1215    STUDIES: Repeat mammography due November.  ASSESSMENT: 85 y.o. [place] woman with bilateral breast cancers  (1) RIGHT BREAST: Status post right lumpectomy and right axillary lymph node sampling 07/07/2013 for a pT1c pN0, stage IA invasive ductal carcinoma, grade 2, estrogen receptor 100% positive, progesterone receptor 97% positive, with an MIB-1 of 22% and no HER-2 amplification  (a) Oncotype DX score of 0 predicts an outside the breast recurrence of 3% within 10 years if the patient's only systemic treatment is tamoxifen for 5 years  (b) adjuvant radiation completed 08/29/2013  (2) LEFT BREAST: Status post left lumpectomy 07/07/2013 for a pT1b pN0, stage IA invasive ductal carcinoma, grade 2, estrogen receptor 100% positive, progesterone receptor 54% positive, with an MIB-1 of 13% and no HER-2 amplification  (b) Completed adjuvant radiation 08/29/2013  (3) SYSTEMIC THERAPY:   (a) started on anastrozole 09/23/2013,  completing the course August 2020   (i) bone density at [facility] 07/12/2014 was normal with a T score of -0.9.   (ii) repeat bone density 03/30/2018 found a normal T score at -0.8   PLAN: Cynthia Garza is now a little over 5 years out from the most recent surgery for breast cancer, with no evidence of disease recurrence.  This is very favorable.  She has completed a little over 5 years of anastrozole.  I do not think continuing an additional 2 years would add significantly to her risk reduction and so I am comfortable with her stopping anastrozole at this point.  She will continue follow-up through her primary care physician Dr. Alain Marion.  All she will need in terms of breast cancer screening and follow-up is her yearly mammography with tomography, which she usually obtains in November, and a yearly physician breast exam.  I will be glad to see surely again at any point in the future if and when the need arises but as of now are making no further routine appointments for her here.   , Virgie Dad, MD  01/12/19 3:46 PM Medical Oncology and Hematology [facility] [address] [place], [place] [address] Tel. [phone]    Fax. [phone]   I, Wilburn Mylar, am acting as scribe for Dr. Virgie Dad. .  I, Lurline Del MD, have reviewed the above documentation for accuracy and completeness, and I agree with the above.

## 2019-01-25 DIAGNOSIS — L821 Other seborrheic keratosis: Secondary | ICD-10-CM | POA: Diagnosis not present

## 2019-01-25 DIAGNOSIS — L814 Other melanin hyperpigmentation: Secondary | ICD-10-CM | POA: Diagnosis not present

## 2019-01-25 DIAGNOSIS — D225 Melanocytic nevi of trunk: Secondary | ICD-10-CM | POA: Diagnosis not present

## 2019-02-11 ENCOUNTER — Other Ambulatory Visit: Payer: Self-pay | Admitting: Neurology

## 2019-02-14 DIAGNOSIS — H524 Presbyopia: Secondary | ICD-10-CM | POA: Diagnosis not present

## 2019-02-14 DIAGNOSIS — H5201 Hypermetropia, right eye: Secondary | ICD-10-CM | POA: Diagnosis not present

## 2019-02-14 DIAGNOSIS — I10 Essential (primary) hypertension: Secondary | ICD-10-CM | POA: Diagnosis not present

## 2019-02-14 DIAGNOSIS — Z961 Presence of intraocular lens: Secondary | ICD-10-CM | POA: Diagnosis not present

## 2019-02-14 DIAGNOSIS — H5212 Myopia, left eye: Secondary | ICD-10-CM | POA: Diagnosis not present

## 2019-02-16 ENCOUNTER — Other Ambulatory Visit: Payer: Self-pay | Admitting: Neurology

## 2019-02-24 ENCOUNTER — Other Ambulatory Visit: Payer: Self-pay

## 2019-02-24 MED ORDER — PRAVASTATIN SODIUM 20 MG PO TABS: 20.0000 mg | ORAL_TABLET | Freq: Every day | ORAL | 0 refills | Status: DC

## 2019-02-28 ENCOUNTER — Other Ambulatory Visit: Payer: Self-pay

## 2019-02-28 ENCOUNTER — Encounter: Payer: Self-pay | Admitting: Neurology

## 2019-02-28 ENCOUNTER — Ambulatory Visit (INDEPENDENT_AMBULATORY_CARE_PROVIDER_SITE_OTHER): Payer: Medicare Other | Admitting: Neurology

## 2019-02-28 VITALS — BP 127/73 | HR 75 | Temp 97.1°F | Wt 138.0 lb

## 2019-02-28 DIAGNOSIS — I6381 Other cerebral infarction due to occlusion or stenosis of small artery: Secondary | ICD-10-CM | POA: Diagnosis not present

## 2019-02-28 MED ORDER — CLOPIDOGREL BISULFATE 75 MG PO TABS: 75.0000 mg | ORAL_TABLET | Freq: Every day | ORAL | 0 refills | Status: DC

## 2019-02-28 NOTE — Patient Instructions (Signed)
I had a long d/w patient and her daughter  about her recent  Lacunar stroke, risk for recurrent stroke/TIAs, personally independently reviewed imaging studies and stroke evaluation results and answered questions.Continue Plavix 75 mg daily  for secondary stroke prevention and maintain strict control of hypertension with blood pressure goal below 130/90, diabetes with hemoglobin A1c goal below 6.5% and lipids with LDL cholesterol goal below 70 mg/dL. Check lipid panel todayI also advised the patient to eat a healthy diet with plenty of whole grains, cereals, fruits and vegetables, exercise regularly and maintain ideal body weight Followup in the future with my nurse practitioner Janett Billow in 6 months or call earlier if necessary.

## 2019-02-28 NOTE — Progress Notes (Signed)
[facility] [address] [place]. [place] [address] [phone]       OFFICE CONSULT NOTE  [name] Date of Birth:  08/10/1932 Medical Record Number:  [id number]   Referring MD: Rosalin Hawking Reason for Referral: Stroke HPI: Initial visit 11/23/2018 Cynthia Garza is a pleasant 85 year old Caucasian lady seen today for initial office consultation visit for stroke.  History is obtained from the patient, her daughter was accompanying her as well as review of electronic medical records and have personally reviewed imaging films in PACS.  She has past medical history of hypertension, hyperlipidemia, depression, gastroesophageal reflux disease osteoporosis who presented on 10/24/2018 with right-sided weakness which she noticed the night before.  She was preparing to go to sleep when she noticed that she had no control of her right upper extremity which was weak and heavy and numb.  She called her daughter and the daughter noticed that while coming to the hospital she was also dragging her right foot but the patient does not remember this.  She started getting better shortly after arrival to the hospital.  CT scan of the head was obtained which was unremarkable.  MRI scan of the brain showed a small 7 mm lacunar infarct in the left centrum semiovale mid posterior white matter.  Carotid ultrasound ultrasound showed no significant extracranial stenosis.  Transthoracic echo showed normal ejection fraction.  LDL cholesterol was elevated at 91 mg percent.  Hemoglobin A1c was 5.6.  MRI of the brain showed short segment severe stenosis of left cavernous carotid.  Patient was started on dual antiplatelet therapy and on protocol.  She states she is done well since discharge.  She has had no recurrent stroke or TIA symptoms.  Right-sided strength is returned back to normal.  She is still on aspirin and Plavix and tolerating them well without significant bleeding or bruising.  She is also  tolerating Pravachol without muscle aches and pains.  She has had no new neurological complaints.  She has no prior history of TIAs or strokes or other neurological problems. Update 02/28/2019 : She returns for follow-up after last visit 3 months ago.  She is accompanied by her daughter.  She states she is doing well.  She is tolerating Plavix well with only mild bruising and no bleeding episodes.  Her blood pressure is well controlled today it is 127/73.  She remains on Pravachol but has not yet had follow-up lipid profile checked.  She had trouble with Zocor in the past and is not too happy with Pravachol either.  I discussed alternatives with her including the new PCSK9 inhibitor injections but she does not like injections either.  She requested refill for Plavix and Pravachol which I gave.  No stroke, TIA or other new complaints. ROS:   14 system review of systems is positive for decreased hearing, back pain and muscle pain today and all systems negative PMH:  Past Medical History:  Diagnosis Date   Anxiety    Breast cancer ([place]) 2015   ER+/PR+/Her2-   Depression    Dyslipidemia    GERD (gastroesophageal reflux disease)    Heart murmur    Hemangioma of intra-abdominal structures    HTN (hypertension)    Insomnia    LBP (low back pain)    Osteoporosis    Paresthesia    Radiation 08/08/13-08/29/13   Bilat.breast 42.72 Gy   Renal cyst    Shingles     Social History:  Social History   Socioeconomic  History   Marital status: Married    Spouse name: Not on file   Number of children: 2   Years of education: Not on file   Highest education level: Not on file  Occupational History   Occupation: Retired    Fish farm manager: RETIRED    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 strain: Not on file   Food insecurity    Worry: Not on file    Inability: Not on file   Transportation needs    Medical: Not on file    Non-medical: Not on file  Tobacco Use    Smoking status: Never Smoker   Smokeless tobacco: Never Used  Substance and Sexual Activity   Alcohol use: No   Drug use: No   Sexual activity: Yes    Comment: menarche age 49, first live birth [age] yrs of age, P53, menopause 79, no HRT  Lifestyle   Physical activity    Days per week: Not on file    Minutes per session: Not on file   Stress: Not on file  Relationships   Social connections    Talks on phone: Not on file    Gets together: Not on file    Attends religious service: Not on file    Active member of club or organization: Not on file    Attends meetings of clubs or organizations: Not on file    Relationship status: Not on file   Intimate partner violence    Fear of current or ex partner: Not on file    Emotionally abused: Not on file    Physically abused: Not on file    Forced sexual activity: Not on file  Other Topics Concern   Not on file  Social History Narrative   Not on file    Medications:   Current Outpatient Medications on File Prior to Visit  Medication Sig Dispense Refill   amLODipine (NORVASC) 5 MG tablet Take 1 tablet (5 mg total) by mouth daily. 90 tablet 1   buPROPion (WELLBUTRIN XL) 150 MG 24 hr tablet TAKE 1 TABLET BY MOUTH EVERY DAY 90 tablet 3   Famotidine (PEPCID PO) Take by mouth.     meclizine (ANTIVERT) 12.5 MG tablet Take 1 tablet (12.5 mg total) by mouth 2 (two) times daily as needed for dizziness. 60 tablet 2   Multiple Vitamins-Minerals (ALIVE ONCE DAILY WOMENS 50+) TABS Take 1 tablet by mouth daily.     Multiple Vitamins-Minerals (PRESERVISION AREDS 2 PO) Take 1 tablet by mouth 2 (two) times daily.     olmesartan-hydrochlorothiazide (BENICAR HCT) 40-25 MG tablet Take 1 tablet by mouth daily. 90 tablet 3   pravastatin (PRAVACHOL) 20 MG tablet Take 1 tablet (20 mg total) by mouth daily at 6 PM. 90 tablet 0   Propylene Glycol (SYSTANE BALANCE) 0.6 % SOLN Place 2 drops into both eyes at bedtime.     vitamin B-12  (CYANOCOBALAMIN) 1000 MCG tablet Take 1,000 mcg by mouth daily.     zolpidem (AMBIEN) 10 MG tablet Take 1 tablet (10 mg total) by mouth at bedtime as needed for sleep. 90 tablet 1   No current facility-administered medications on file prior to visit.     Allergies:   Allergies  Allergen Reactions   Cholestoff [Plant Sterols And Stanols]     unknown   Lexapro [Escitalopram Oxalate]     fatigue   Simvastatin Other (See Comments)    aches   Diflucan [Fluconazole] Rash  Physical Exam General: Pleasant frail elderly Caucasian lady, seated, in no evident distress Head: head normocephalic and atraumatic.   Neck: supple with no carotid or supraclavicular bruits Cardiovascular: regular rate and rhythm, no murmurs Musculoskeletal: no deformity Skin:  no rash/petichiae Vascular:  Normal pulses all extremities  Neurologic Exam Mental Status: Awake and fully alert. Oriented to place and time. Recent and remote memory intact. Attention span, concentration and fund of knowledge appropriate. Mood and affect appropriate.  Cranial Nerves: Fundoscopic exam reveals sharp disc margins. Pupils equal, briskly reactive to light. Extraocular movements full without nystagmus. Visual fields full to confrontation. Hearing mildly diminished bilaterally facial sensation intact. Face, tongue, palate moves normally and symmetrically.  Motor: Normal bulk and tone. Normal strength in all tested extremity muscles.  Diminished fine finger movements on the right.  Orbits left over right upper extremity.  Trace weakness of right grip only. Sensory.: intact to touch , pinprick , position and vibratory sensation.  Coordination: Rapid alternating movements normal in all extremities. Finger-to-nose and heel-to-shin performed accurately bilaterally. Gait and Station: Arises from chair without difficulty. Stance is normal. Gait demonstrates normal stride length and balance . Able to heel, toe and tandem walk with  difficulty.  Reflexes: 1+ and symmetric. Toes downgoing.       ASSESSMENT: 85 year old lady with left brain subcortical lacunar infarct in May 2020 secondary to small vessel disease from which she has recovered quite well.  Vascular risk factors of age hypertension hyperlipidemia and cavernous carotid stenosis     PLAN: I had a long d/w patient and her daughter about her  recent lacunarstroke, risk for recurrent stroke/TIAs, personally independently reviewed imaging studies and stroke evaluation results and answered questions.Continue Plavix 75 mg daily   for secondary stroke prevention and maintain strict control of hypertension with blood pressure goal below 130/90, diabetes with hemoglobin A1c goal below 6.5% and lipids with LDL cholesterol goal below 70 mg/dL. I also advised the patient to eat a healthy diet with plenty of whole grains, cereals, fruits and vegetables, exercise regularly and maintain ideal body weight. Check f/u lipid profile  today.  Greater than 50% time during this 25-minute  visit was spent on counseling and coordination of care about recent lacunar stroke and discussion about a prevention and treatment plan and answering questions followup in the future with my nurse practitioner Janett Billow in 6 months or call earlier if necessary. Antony Contras, MD  [facility] [address] [place], [place] [address]  Phone [phone] Fax [phone] Note: This document was prepared with digital dictation and possible smart phrase technology. Any transcriptional errors that result from this process are unintentional.

## 2019-03-01 ENCOUNTER — Telehealth: Payer: Self-pay

## 2019-03-01 ENCOUNTER — Other Ambulatory Visit (HOSPITAL_COMMUNITY): Payer: Self-pay | Admitting: Neurology

## 2019-03-01 LAB — LIPID PANEL
Chol/HDL Ratio: 3.8 ratio (ref 0.0–4.4)
Cholesterol, Total: 216 mg/dL — ABNORMAL HIGH (ref 100–199)
HDL: 57 mg/dL (ref >39)
LDL Chol Calc (NIH): 129 mg/dL — ABNORMAL HIGH (ref 0–99)
Triglycerides: 173 mg/dL — ABNORMAL HIGH (ref 0–149)
VLDL Cholesterol Cal: 30 mg/dL (ref 5–40)

## 2019-03-01 MED ORDER — PRAVASTATIN SODIUM 40 MG PO TABS: 40.0000 mg | ORAL_TABLET | Freq: Every day | ORAL | 3 refills | Status: DC

## 2019-03-01 NOTE — Telephone Encounter (Signed)
-----   Message from Garvin Fila, MD sent at 03/01/2019  8:18 AM EDT ----- Cynthia Garza inform patient that her lipids are all high and she needs to increase pravastain dose to 40 mg daily. I have updated her Rx in epic

## 2019-03-01 NOTE — Telephone Encounter (Signed)
I called pt and gave her the lipid panel results. I stated the lipids are high and she needs to increase pravastatin 40mg  daily. A new rx was sent to her pharmacy. Pt stated she just receive a 90 day rx of 20mg . I stated to take two 20mg  of pravastatin at dinner and once that rx is completed to take one 40mg  of pravastatin at dinner. Pt verbalized understanding. I stated the 40mg  of pravastatin was already sent to pharmacy. Pt verbalized understanding.

## 2019-03-31 DIAGNOSIS — Z853 Personal history of malignant neoplasm of breast: Secondary | ICD-10-CM | POA: Diagnosis not present

## 2019-03-31 LAB — HM MAMMOGRAPHY

## 2019-04-13 ENCOUNTER — Other Ambulatory Visit (INDEPENDENT_AMBULATORY_CARE_PROVIDER_SITE_OTHER): Payer: Medicare Other

## 2019-04-13 ENCOUNTER — Other Ambulatory Visit: Payer: Self-pay

## 2019-04-13 ENCOUNTER — Ambulatory Visit (INDEPENDENT_AMBULATORY_CARE_PROVIDER_SITE_OTHER): Payer: Medicare Other | Admitting: Internal Medicine

## 2019-04-13 ENCOUNTER — Encounter: Payer: Self-pay | Admitting: Internal Medicine

## 2019-04-13 VITALS — BP 130/72 | HR 72 | Temp 97.8°F | Ht 61.0 in | Wt 135.0 lb

## 2019-04-13 DIAGNOSIS — F5101 Primary insomnia: Secondary | ICD-10-CM | POA: Diagnosis not present

## 2019-04-13 DIAGNOSIS — Z23 Encounter for immunization: Secondary | ICD-10-CM | POA: Diagnosis not present

## 2019-04-13 DIAGNOSIS — E785 Hyperlipidemia, unspecified: Secondary | ICD-10-CM | POA: Diagnosis not present

## 2019-04-13 DIAGNOSIS — I6381 Other cerebral infarction due to occlusion or stenosis of small artery: Secondary | ICD-10-CM | POA: Diagnosis not present

## 2019-04-13 DIAGNOSIS — I10 Essential (primary) hypertension: Secondary | ICD-10-CM

## 2019-04-13 DIAGNOSIS — I63232 Cerebral infarction due to unspecified occlusion or stenosis of left carotid arteries: Secondary | ICD-10-CM

## 2019-04-13 DIAGNOSIS — M810 Age-related osteoporosis without current pathological fracture: Secondary | ICD-10-CM | POA: Diagnosis not present

## 2019-04-13 LAB — LIPID PANEL
Cholesterol: 188 mg/dL (ref 0–200)
HDL: 53.2 mg/dL (ref >39.00)
NonHDL: 134.81
Total CHOL/HDL Ratio: 4
Triglycerides: 241 mg/dL — ABNORMAL HIGH (ref 0.0–149.0)
VLDL: 48.2 mg/dL — ABNORMAL HIGH (ref 0.0–40.0)

## 2019-04-13 LAB — BASIC METABOLIC PANEL
BUN: 23 mg/dL (ref 6–23)
CO2: 27 mEq/L (ref 19–32)
Calcium: 10.3 mg/dL (ref 8.4–10.5)
Chloride: 102 mEq/L (ref 96–112)
Creatinine, Ser: 0.92 mg/dL (ref 0.40–1.20)
GFR: 58.13 mL/min — ABNORMAL LOW (ref >60.00)
Glucose, Bld: 97 mg/dL (ref 70–99)
Potassium: 3.9 mEq/L (ref 3.5–5.1)
Sodium: 141 mEq/L (ref 135–145)

## 2019-04-13 LAB — HEPATIC FUNCTION PANEL
ALT: 9 U/L (ref 0–35)
AST: 16 U/L (ref 0–37)
Albumin: 4.6 g/dL (ref 3.5–5.2)
Alkaline Phosphatase: 64 U/L (ref 39–117)
Bilirubin, Direct: 0.1 mg/dL (ref 0.0–0.3)
Total Bilirubin: 0.5 mg/dL (ref 0.2–1.2)
Total Protein: 7.3 g/dL (ref 6.0–8.3)

## 2019-04-13 LAB — LDL CHOLESTEROL, DIRECT: Direct LDL: 96 mg/dL

## 2019-04-13 MED ORDER — VITAMIN D3 50 MCG (2000 UT) PO CAPS: 2000.0000 [IU] | ORAL_CAPSULE | Freq: Every day | ORAL | 3 refills | Status: AC

## 2019-04-13 NOTE — Assessment & Plan Note (Signed)
Vit D 

## 2019-04-13 NOTE — Assessment & Plan Note (Signed)
Labs Pravastatin Vit D

## 2019-04-13 NOTE — Assessment & Plan Note (Signed)
Benicar HCT, Amlodipine 

## 2019-04-13 NOTE — Assessment & Plan Note (Signed)
Plavix, ASA, Pravachol

## 2019-04-13 NOTE — Assessment & Plan Note (Addendum)
Zolpidem  Potential benefits of a long term benzodiazepines  use as well as potential risks  and complications were explained to the patient and were aknowledged.  

## 2019-04-13 NOTE — Progress Notes (Signed)
Subjective:  Patient ID: Cynthia Garza, female    DOB: 04/08/1933  Age: 85 y.o. MRN: [id number]  CC: No chief complaint on file.   HPI ABRIGAIL CORMAN presents for HTN, anxiety, insomnia f/u  Outpatient Medications Prior to Visit  Medication Sig Dispense Refill   amLODipine (NORVASC) 5 MG tablet Take 1 tablet (5 mg total) by mouth daily. 90 tablet 1   BIOTIN PO Take by mouth.     buPROPion (WELLBUTRIN XL) 150 MG 24 hr tablet TAKE 1 TABLET BY MOUTH EVERY DAY 90 tablet 3   clopidogrel (PLAVIX) 75 MG tablet Take 1 tablet (75 mg total) by mouth daily. 90 tablet 0   Coenzyme Q10 (CO Q-10 PO) Take by mouth.     Famotidine (PEPCID PO) Take by mouth.     meclizine (ANTIVERT) 12.5 MG tablet Take 1 tablet (12.5 mg total) by mouth 2 (two) times daily as needed for dizziness. 60 tablet 2   Multiple Vitamins-Minerals (ALIVE ONCE DAILY WOMENS 50+) TABS Take 1 tablet by mouth daily.     Multiple Vitamins-Minerals (PRESERVISION AREDS 2 PO) Take 1 tablet by mouth 2 (two) times daily.     olmesartan-hydrochlorothiazide (BENICAR HCT) 40-25 MG tablet Take 1 tablet by mouth daily. 90 tablet 3   pravastatin (PRAVACHOL) 40 MG tablet Take 1 tablet (40 mg total) by mouth daily at 6 PM. 30 tablet 3   Propylene Glycol (SYSTANE BALANCE) 0.6 % SOLN Place 2 drops into both eyes at bedtime.     vitamin B-12 (CYANOCOBALAMIN) 1000 MCG tablet Take 1,000 mcg by mouth daily.     zolpidem (AMBIEN) 10 MG tablet Take 1 tablet (10 mg total) by mouth at bedtime as needed for sleep. 90 tablet 1   No facility-administered medications prior to visit.     ROS: Review of Systems  Constitutional: Negative for activity change, appetite change, chills, fatigue and unexpected weight change.  HENT: Negative for congestion, mouth sores and sinus pressure.   Eyes: Negative for visual disturbance.  Respiratory: Negative for cough and chest tightness.   Gastrointestinal: Negative for abdominal pain and nausea.    Genitourinary: Negative for difficulty urinating, frequency and vaginal pain.  Musculoskeletal: Negative for back pain and gait problem.  Skin: Negative for pallor and rash.  Neurological: Negative for dizziness, tremors, weakness, numbness and headaches.  Psychiatric/Behavioral: Positive for sleep disturbance. Negative for confusion. The patient is not nervous/anxious.     Objective:  BP 130/72 (BP Location: Left Arm, Patient Position: Sitting, Cuff Size: Normal)    Pulse 72    Temp 97.8 F (36.6 C) (Oral)    Ht 5\' 1"  (1.549 m)    Wt 135 lb (61.2 kg)    SpO2 96%    BMI 25.51 kg/m   BP Readings from Last 3 Encounters:  04/13/19 130/72  02/28/19 127/73  01/12/19 (!) 139/55    Wt Readings from Last 3 Encounters:  04/13/19 135 lb (61.2 kg)  02/28/19 138 lb (62.6 kg)  01/12/19 138 lb 14.4 oz (63 kg)    Physical Exam Constitutional:      General: She is not in acute distress.    Appearance: She is well-developed.  HENT:     Head: Normocephalic.     Right Ear: External ear normal.     Left Ear: External ear normal.     Nose: Nose normal.  Eyes:     General:        Right eye: No discharge.  Left eye: No discharge.     Conjunctiva/sclera: Conjunctivae normal.     Pupils: Pupils are equal, round, and reactive to light.  Neck:     Musculoskeletal: Normal range of motion and neck supple.     Thyroid: No thyromegaly.     Vascular: No JVD.     Trachea: No tracheal deviation.  Cardiovascular:     Rate and Rhythm: Normal rate and regular rhythm.     Heart sounds: Normal heart sounds.  Pulmonary:     Effort: No respiratory distress.     Breath sounds: No stridor. No wheezing.  Abdominal:     General: Bowel sounds are normal. There is no distension.     Palpations: Abdomen is soft. There is no mass.     Tenderness: There is no abdominal tenderness. There is no guarding or rebound.  Musculoskeletal:        General: No tenderness.  Lymphadenopathy:     Cervical: No  cervical adenopathy.  Skin:    Findings: No erythema or rash.  Neurological:     Mental Status: She is oriented to person, place, and time.     Cranial Nerves: No cranial nerve deficit.     Motor: No abnormal muscle tone.     Coordination: Coordination normal.     Deep Tendon Reflexes: Reflexes normal.  Psychiatric:        Behavior: Behavior normal.        Thought Content: Thought content normal.        Judgment: Judgment normal.     Lab Results  Component Value Date   WBC 7.9 10/24/2018   HGB 12.9 10/24/2018   HCT 38.0 10/24/2018   PLT 206 10/24/2018   GLUCOSE 99 11/03/2018   CHOL 216 (H) 02/28/2019   TRIG 173 (H) 02/28/2019   HDL 57 02/28/2019   LDLDIRECT 144.7 02/09/2012   LDLCALC 129 (H) 02/28/2019   ALT 11 10/24/2018   AST 22 10/24/2018   NA 140 11/03/2018   K 3.5 11/03/2018   CL 103 11/03/2018   CREATININE 0.99 11/03/2018   BUN 23 11/03/2018   CO2 29 11/03/2018   TSH 1.97 12/18/2015   INR 1.0 10/24/2018   HGBA1C 5.6 10/24/2018    Ct Head Wo Contrast  Result Date: 10/24/2018 CLINICAL DATA:  Right upper extremity heaviness and lower extremity numbness EXAM: CT HEAD WITHOUT CONTRAST TECHNIQUE: Contiguous axial images were obtained from the base of the skull through the vertex without intravenous contrast. COMPARISON:  None. FINDINGS: Brain: No acute territorial infarction, hemorrhage, or intracranial mass. The ventricles are nonenlarged. Vascular: No hyperdense vessels.  Carotid vascular calcification. Skull: Normal. Negative for fracture or focal lesion. Sinuses/Orbits: No acute finding. Other: Chronic appearing abnormality at the anterior arch of C1, incompletely visualized. Prominent degenerative changes at the C1-C2 articulation. IMPRESSION: Negative non contrasted CT appearance of the brain.  Mild atrophy Electronically Signed   By: Donavan Foil M.D.   On: 10/24/2018 01:00   Mr Brain Wo Contrast  Result Date: 10/24/2018 CLINICAL DATA:  Initial evaluation for  new onset right-sided arm and leg weakness. EXAM: MRI HEAD WITHOUT CONTRAST MRA HEAD WITHOUT CONTRAST TECHNIQUE: Multiplanar, multiecho pulse sequences of the brain and surrounding structures were obtained without intravenous contrast. Angiographic images of the head were obtained using MRA technique without contrast. COMPARISON:  Prior CT from earlier the same day. FINDINGS: MRI HEAD FINDINGS Brain: Cerebral volume within normal limits for age. Minimal T2/FLAIR hyperintensity noted within the periventricular white matter,  nonspecific, and felt to be within normal limits for age. 7 mm focus of diffusion abnormality seen at the mid-posterior left centrum semi ovale (series 5, image 80). No associated hemorrhage. No other evidence for acute or subacute ischemia. Gray-white matter differentiation otherwise maintained. No other areas of remote cortical infarction. No foci of susceptibility artifact to suggest acute or chronic intracranial hemorrhage. No mass lesion, midline shift or mass effect. No hydrocephalus. No extra-axial fluid collection. Pituitary gland suprasellar region normal. Midline structures intact. Vascular: Major intracranial vascular flow voids are maintained. Skull and upper cervical spine: Craniocervical junction normal. Bone marrow signal intensity within normal limits. Hyperostosis frontalis interna noted. Scalp soft tissues unremarkable. Sinuses/Orbits: Patient status post bilateral ocular lens replacement. Globes orbital soft tissues demonstrate no acute finding. Paranasal sinuses are clear. No mastoid effusion. Inner ear structures normal. Other: None. MRA HEAD FINDINGS ANTERIOR CIRCULATION: Distal cervical segments of the internal carotid arteries are patent with symmetric antegrade flow. Petrous segments widely patent bilaterally. Scattered atheromatous irregularity within the cavernous/supraclinoid ICAs. No significant stenosis seen on the right. There is a short-segment severe stenosis at  the anterior genu of the cavernous left ICA (series 9, image 104). ICA termini well perfused. A1 segments patent bilaterally. Normal anterior communicating artery. Anterior cerebral arteries widely patent to their distal aspects. M1 segments widely patent bilaterally. Normal MCA bifurcations. Distal MCA branches well perfused and symmetric. Mild distal small vessel atheromatous irregularity. POSTERIOR CIRCULATION: Vertebral arteries patent to the vertebrobasilar junction without stenosis. Left vertebral artery dominant. Posterior inferior cerebral arteries patent bilaterally. Basilar patent to its distal aspect without stenosis. Superior cerebral arteries patent bilaterally. Both of the posterior cerebral arteries primarily supplied via the basilar and are well perfused to their distal aspects. No intracranial aneurysm. IMPRESSION: MRI HEAD IMPRESSION: 1. 7 mm acute ischemic nonhemorrhagic infarct involving the deep white matter of the mid-posterior left centrum semi ovale. 2. Otherwise normal brain MRI for age. MRA HEAD IMPRESSION: 1. Negative intracranial MRA for large vessel occlusion. 2. Short-segment severe cavernous left ICA stenosis. 3. Otherwise negative intracranial MRA. No other hemodynamically significant or correctable stenosis. Electronically Signed   By: Jeannine Boga M.D.   On: 10/24/2018 06:59   Dg Chest Portable 1 View  Result Date: 10/24/2018 CLINICAL DATA:  TIA EXAM: PORTABLE CHEST 1 VIEW COMPARISON:  08/14/2017 FINDINGS: The heart size and mediastinal contours are within normal limits. Both lungs are clear. Surgical clips over the axilla bilaterally. Aortic atherosclerosis. IMPRESSION: No active disease. Electronically Signed   By: Donavan Foil M.D.   On: 10/24/2018 00:43   Mr Virgel Paling [id number] Contrast  Result Date: 10/24/2018 CLINICAL DATA:  Initial evaluation for new onset right-sided arm and leg weakness. EXAM: MRI HEAD WITHOUT CONTRAST MRA HEAD WITHOUT CONTRAST TECHNIQUE:  Multiplanar, multiecho pulse sequences of the brain and surrounding structures were obtained without intravenous contrast. Angiographic images of the head were obtained using MRA technique without contrast. COMPARISON:  Prior CT from earlier the same day. FINDINGS: MRI HEAD FINDINGS Brain: Cerebral volume within normal limits for age. Minimal T2/FLAIR hyperintensity noted within the periventricular white matter, nonspecific, and felt to be within normal limits for age. 7 mm focus of diffusion abnormality seen at the mid-posterior left centrum semi ovale (series 5, image 80). No associated hemorrhage. No other evidence for acute or subacute ischemia. Gray-white matter differentiation otherwise maintained. No other areas of remote cortical infarction. No foci of susceptibility artifact to suggest acute or chronic intracranial hemorrhage. No mass lesion, midline shift or  mass effect. No hydrocephalus. No extra-axial fluid collection. Pituitary gland suprasellar region normal. Midline structures intact. Vascular: Major intracranial vascular flow voids are maintained. Skull and upper cervical spine: Craniocervical junction normal. Bone marrow signal intensity within normal limits. Hyperostosis frontalis interna noted. Scalp soft tissues unremarkable. Sinuses/Orbits: Patient status post bilateral ocular lens replacement. Globes orbital soft tissues demonstrate no acute finding. Paranasal sinuses are clear. No mastoid effusion. Inner ear structures normal. Other: None. MRA HEAD FINDINGS ANTERIOR CIRCULATION: Distal cervical segments of the internal carotid arteries are patent with symmetric antegrade flow. Petrous segments widely patent bilaterally. Scattered atheromatous irregularity within the cavernous/supraclinoid ICAs. No significant stenosis seen on the right. There is a short-segment severe stenosis at the anterior genu of the cavernous left ICA (series 9, image 104). ICA termini well perfused. A1 segments patent  bilaterally. Normal anterior communicating artery. Anterior cerebral arteries widely patent to their distal aspects. M1 segments widely patent bilaterally. Normal MCA bifurcations. Distal MCA branches well perfused and symmetric. Mild distal small vessel atheromatous irregularity. POSTERIOR CIRCULATION: Vertebral arteries patent to the vertebrobasilar junction without stenosis. Left vertebral artery dominant. Posterior inferior cerebral arteries patent bilaterally. Basilar patent to its distal aspect without stenosis. Superior cerebral arteries patent bilaterally. Both of the posterior cerebral arteries primarily supplied via the basilar and are well perfused to their distal aspects. No intracranial aneurysm. IMPRESSION: MRI HEAD IMPRESSION: 1. 7 mm acute ischemic nonhemorrhagic infarct involving the deep white matter of the mid-posterior left centrum semi ovale. 2. Otherwise normal brain MRI for age. MRA HEAD IMPRESSION: 1. Negative intracranial MRA for large vessel occlusion. 2. Short-segment severe cavernous left ICA stenosis. 3. Otherwise negative intracranial MRA. No other hemodynamically significant or correctable stenosis. Electronically Signed   By: Jeannine Boga M.D.   On: 10/24/2018 06:59   Vas US Carotid (at [place] Only)  Result Date: 10/25/2018 Carotid Arterial Duplex Study Indications:  CVA. Risk Factors: Hypertension. Performing Technologist: Abram Sander RVS  Examination Guidelines: A complete evaluation includes B-mode imaging, spectral Doppler, color Doppler, and power Doppler as needed of all accessible portions of each vessel. Bilateral testing is considered an integral part of a complete examination. Limited examinations for reoccurring indications may be performed as noted.  Right Carotid Findings: +----------+--------+--------+--------+------------+--------+             PSV cm/s EDV cm/s Stenosis Describe     Comments  +----------+--------+--------+--------+------------+--------+   CCA Prox   110      19                heterogenous           +----------+--------+--------+--------+------------+--------+  CCA Distal 103      19                heterogenous           +----------+--------+--------+--------+------------+--------+  ICA Prox   73       17       1-39%    heterogenous           +----------+--------+--------+--------+------------+--------+  ICA Distal 106      24                                       +----------+--------+--------+--------+------------+--------+  ECA        94       6                                        +----------+--------+--------+--------+------------+--------+ +----------+--------+-------+--------+-------------------+  PSV cm/s EDV cms Describe Arm Pressure (mmHG)  +----------+--------+-------+--------+-------------------+  Subclavian 141                                            +----------+--------+-------+--------+-------------------+ +---------+--------+--+--------+--+---------+  Vertebral PSV cm/s 37 EDV cm/s 10 Antegrade  +---------+--------+--+--------+--+---------+  Left Carotid Findings: +----------+--------+--------+--------+------------+--------+             PSV cm/s EDV cm/s Stenosis Describe     Comments  +----------+--------+--------+--------+------------+--------+  CCA Prox   132      14                heterogenous           +----------+--------+--------+--------+------------+--------+  CCA Distal 100      17                heterogenous           +----------+--------+--------+--------+------------+--------+  ICA Prox   107      25       1-39%    heterogenous           +----------+--------+--------+--------+------------+--------+  ICA Distal 82       13                                       +----------+--------+--------+--------+------------+--------+  ECA        80                                                +----------+--------+--------+--------+------------+--------+ +----------+--------+--------+--------+-------------------+   Subclavian PSV cm/s EDV cm/s Describe Arm Pressure (mmHG)  +----------+--------+--------+--------+-------------------+             138                                             +----------+--------+--------+--------+-------------------+ +---------+--------+--+--------+--+---------+  Vertebral PSV cm/s 67 EDV cm/s 17 Antegrade  +---------+--------+--+--------+--+---------+  Summary: Right Carotid: Velocities in the right ICA are consistent with a 1-39% stenosis. Left Carotid: Velocities in the left ICA are consistent with a 1-39% stenosis. Vertebrals: Bilateral vertebral arteries demonstrate antegrade flow. *See table(s) above for measurements and observations.  Electronically signed by Antony Contras MD on 10/25/2018 at 12:23:35 PM.    Final     Assessment & Plan:   There are no diagnoses linked to this encounter.   No orders of the defined types were placed in this encounter.    Follow-up: No follow-ups on file.  Walker Kehr, MD

## 2019-06-02 ENCOUNTER — Other Ambulatory Visit: Payer: Self-pay | Admitting: Neurology

## 2019-06-11 ENCOUNTER — Other Ambulatory Visit: Payer: Self-pay | Admitting: Internal Medicine

## 2019-06-11 ENCOUNTER — Other Ambulatory Visit: Payer: Self-pay | Admitting: Neurology

## 2019-06-28 ENCOUNTER — Telehealth: Payer: Self-pay

## 2019-06-28 NOTE — Telephone Encounter (Signed)
Patient calling and states that she would like a call back from [facility] or Dr Alain Marion.  CB#: [phone]

## 2019-06-28 NOTE — Telephone Encounter (Signed)
LMTCB

## 2019-07-12 ENCOUNTER — Other Ambulatory Visit: Payer: Self-pay

## 2019-07-12 ENCOUNTER — Ambulatory Visit (INDEPENDENT_AMBULATORY_CARE_PROVIDER_SITE_OTHER): Payer: Medicare Other | Admitting: Internal Medicine

## 2019-07-12 ENCOUNTER — Ambulatory Visit (INDEPENDENT_AMBULATORY_CARE_PROVIDER_SITE_OTHER): Payer: Medicare Other

## 2019-07-12 ENCOUNTER — Encounter: Payer: Self-pay | Admitting: Internal Medicine

## 2019-07-12 VITALS — BP 122/74 | HR 83 | Temp 97.9°F | Ht 61.0 in | Wt 130.4 lb

## 2019-07-12 DIAGNOSIS — F419 Anxiety disorder, unspecified: Secondary | ICD-10-CM | POA: Diagnosis not present

## 2019-07-12 DIAGNOSIS — I10 Essential (primary) hypertension: Secondary | ICD-10-CM | POA: Diagnosis not present

## 2019-07-12 DIAGNOSIS — Z20828 Contact with and (suspected) exposure to other viral communicable diseases: Secondary | ICD-10-CM | POA: Diagnosis not present

## 2019-07-12 DIAGNOSIS — R05 Cough: Secondary | ICD-10-CM

## 2019-07-12 DIAGNOSIS — E785 Hyperlipidemia, unspecified: Secondary | ICD-10-CM

## 2019-07-12 DIAGNOSIS — I63232 Cerebral infarction due to unspecified occlusion or stenosis of left carotid arteries: Secondary | ICD-10-CM | POA: Diagnosis not present

## 2019-07-12 DIAGNOSIS — J9811 Atelectasis: Secondary | ICD-10-CM | POA: Diagnosis not present

## 2019-07-12 DIAGNOSIS — R059 Cough, unspecified: Secondary | ICD-10-CM | POA: Insufficient documentation

## 2019-07-12 MED ORDER — ALBUTEROL SULFATE HFA 108 (90 BASE) MCG/ACT IN AERS: 2.0000 | INHALATION_SPRAY | RESPIRATORY_TRACT | 3 refills | Status: DC | PRN

## 2019-07-12 MED ORDER — AZITHROMYCIN 250 MG PO TABS: ORAL_TABLET | ORAL | 0 refills | Status: DC

## 2019-07-12 MED ORDER — METHYLPREDNISOLONE 4 MG PO TBPK: ORAL_TABLET | ORAL | 0 refills | Status: DC

## 2019-07-12 NOTE — Assessment & Plan Note (Signed)
CXR Zpac, Medrol pac if worse Thx

## 2019-07-12 NOTE — Assessment & Plan Note (Signed)
Plavix, ASA, Pravachol

## 2019-07-12 NOTE — Assessment & Plan Note (Signed)
Pravastatin  

## 2019-07-12 NOTE — Progress Notes (Signed)
Subjective:  Patient ID: Cynthia Garza, female    DOB: 1933/02/05  Age: 86 y.o. MRN: [id number]  CC: No chief complaint on file.   HPI Cynthia Garza presents for HTN, CVA C/o feeling achy after COVID vaccine C/o some cough - worried about pneumonia like she had last year...  Outpatient Medications Prior to Visit  Medication Sig Dispense Refill  . amLODipine (NORVASC) 5 MG tablet TAKE 1 TABLET BY MOUTH EVERY DAY 90 tablet 3  . BIOTIN PO Take by mouth.    Marland Kitchen buPROPion (WELLBUTRIN XL) 150 MG 24 hr tablet TAKE 1 TABLET BY MOUTH EVERY DAY 90 tablet 3  . Cholecalciferol (VITAMIN D3) 50 MCG (2000 UT) capsule Take 1 capsule (2,000 Units total) by mouth daily. 100 capsule 3  . clopidogrel (PLAVIX) 75 MG tablet TAKE 1 TABLET BY MOUTH EVERY DAY 90 tablet 0  . Coenzyme Q10 (CO Q-10 PO) Take by mouth.    . Famotidine (PEPCID PO) Take by mouth.    . meclizine (ANTIVERT) 12.5 MG tablet Take 1 tablet (12.5 mg total) by mouth 2 (two) times daily as needed for dizziness. 60 tablet 2  . Multiple Vitamins-Minerals (ALIVE ONCE DAILY WOMENS 50+) TABS Take 1 tablet by mouth daily.    . Multiple Vitamins-Minerals (PRESERVISION AREDS 2 PO) Take 1 tablet by mouth 2 (two) times daily.    Marland Kitchen olmesartan-hydrochlorothiazide (BENICAR HCT) 40-25 MG tablet Take 1 tablet by mouth daily. 90 tablet 3  . pravastatin (PRAVACHOL) 40 MG tablet Take 1 tablet (40 mg total) by mouth daily at 6 PM. 30 tablet 3  . Propylene Glycol (SYSTANE BALANCE) 0.6 % SOLN Place 2 drops into both eyes at bedtime.    . vitamin B-12 (CYANOCOBALAMIN) 1000 MCG tablet Take 1,000 mcg by mouth daily.    Marland Kitchen zolpidem (AMBIEN) 10 MG tablet Take 1 tablet (10 mg total) by mouth at bedtime as needed for sleep. 90 tablet 1  . anastrozole (ARIMIDEX) 1 MG tablet Take 1 mg by mouth daily.    . pravastatin (PRAVACHOL) 20 MG tablet Take 20 mg by mouth at bedtime.     No facility-administered medications prior to visit.    ROS: Review of Systems    Constitutional: Positive for fatigue. Negative for activity change, appetite change, chills and unexpected weight change.  HENT: Negative for congestion, mouth sores and sinus pressure.   Eyes: Negative for visual disturbance.  Respiratory: Positive for cough and shortness of breath. Negative for chest tightness and wheezing.   Gastrointestinal: Negative for abdominal pain and nausea.  Genitourinary: Negative for difficulty urinating, frequency and vaginal pain.  Musculoskeletal: Negative for back pain and gait problem.  Skin: Negative for pallor and rash.  Neurological: Negative for dizziness, tremors, weakness, numbness and headaches.  Psychiatric/Behavioral: Negative for confusion and sleep disturbance.    Objective:  BP 122/74 (BP Location: Right Arm, Patient Position: Sitting, Cuff Size: Small)   Pulse 83   Temp 97.9 F (36.6 C) (Oral)   Ht 5\' 1"  (1.549 m)   Wt 130 lb 6 oz (59.1 kg)   SpO2 96%   BMI 24.63 kg/m   BP Readings from Last 3 Encounters:  07/12/19 122/74  04/13/19 130/72  02/28/19 127/73    Wt Readings from Last 3 Encounters:  07/12/19 130 lb 6 oz (59.1 kg)  04/13/19 135 lb (61.2 kg)  02/28/19 138 lb (62.6 kg)    Physical Exam Constitutional:      General: She is not in acute  distress.    Appearance: She is well-developed.  HENT:     Head: Normocephalic.     Right Ear: External ear normal.     Left Ear: External ear normal.     Nose: Nose normal.  Eyes:     General:        Right eye: No discharge.        Left eye: No discharge.     Conjunctiva/sclera: Conjunctivae normal.     Pupils: Pupils are equal, round, and reactive to light.  Neck:     Thyroid: No thyromegaly.     Vascular: No JVD.     Trachea: No tracheal deviation.  Cardiovascular:     Rate and Rhythm: Normal rate and regular rhythm.     Heart sounds: Normal heart sounds.  Pulmonary:     Effort: No respiratory distress.     Breath sounds: No stridor. No wheezing.  Abdominal:      General: Bowel sounds are normal. There is no distension.     Palpations: Abdomen is soft. There is no mass.     Tenderness: There is no abdominal tenderness. There is no guarding or rebound.  Musculoskeletal:        General: No tenderness.     Cervical back: Normal range of motion and neck supple.  Lymphadenopathy:     Cervical: No cervical adenopathy.  Skin:    Findings: No erythema or rash.  Neurological:     Cranial Nerves: No cranial nerve deficit.     Motor: No abnormal muscle tone.     Coordination: Coordination normal.     Deep Tendon Reflexes: Reflexes normal.  Psychiatric:        Behavior: Behavior normal.        Thought Content: Thought content normal.        Judgment: Judgment normal.     Lab Results  Component Value Date   WBC 7.9 10/24/2018   HGB 12.9 10/24/2018   HCT 38.0 10/24/2018   PLT 206 10/24/2018   GLUCOSE 97 04/13/2019   CHOL 188 04/13/2019   TRIG 241.0 (H) 04/13/2019   HDL 53.20 04/13/2019   LDLDIRECT 96.0 04/13/2019   LDLCALC 129 (H) 02/28/2019   ALT 9 04/13/2019   AST 16 04/13/2019   NA 141 04/13/2019   K 3.9 04/13/2019   CL 102 04/13/2019   CREATININE 0.92 04/13/2019   BUN 23 04/13/2019   CO2 27 04/13/2019   TSH 1.97 12/18/2015   INR 1.0 10/24/2018   HGBA1C 5.6 10/24/2018    CT Head Wo Contrast  Result Date: 10/24/2018 CLINICAL DATA:  Right upper extremity heaviness and lower extremity numbness EXAM: CT HEAD WITHOUT CONTRAST TECHNIQUE: Contiguous axial images were obtained from the base of the skull through the vertex without intravenous contrast. COMPARISON:  None. FINDINGS: Brain: No acute territorial infarction, hemorrhage, or intracranial mass. The ventricles are nonenlarged. Vascular: No hyperdense vessels.  Carotid vascular calcification. Skull: Normal. Negative for fracture or focal lesion. Sinuses/Orbits: No acute finding. Other: Chronic appearing abnormality at the anterior arch of C1, incompletely visualized. Prominent degenerative  changes at the C1-C2 articulation. IMPRESSION: Negative non contrasted CT appearance of the brain.  Mild atrophy Electronically Signed   By: Donavan Foil M.D.   On: 10/24/2018 01:00   MR BRAIN WO CONTRAST  Result Date: 10/24/2018 CLINICAL DATA:  Initial evaluation for new onset right-sided arm and leg weakness. EXAM: MRI HEAD WITHOUT CONTRAST MRA HEAD WITHOUT CONTRAST TECHNIQUE: Multiplanar, multiecho pulse sequences of  the brain and surrounding structures were obtained without intravenous contrast. Angiographic images of the head were obtained using MRA technique without contrast. COMPARISON:  Prior CT from earlier the same day. FINDINGS: MRI HEAD FINDINGS Brain: Cerebral volume within normal limits for age. Minimal T2/FLAIR hyperintensity noted within the periventricular white matter, nonspecific, and felt to be within normal limits for age. 7 mm focus of diffusion abnormality seen at the mid-posterior left centrum semi ovale (series 5, image 80). No associated hemorrhage. No other evidence for acute or subacute ischemia. Gray-white matter differentiation otherwise maintained. No other areas of remote cortical infarction. No foci of susceptibility artifact to suggest acute or chronic intracranial hemorrhage. No mass lesion, midline shift or mass effect. No hydrocephalus. No extra-axial fluid collection. Pituitary gland suprasellar region normal. Midline structures intact. Vascular: Major intracranial vascular flow voids are maintained. Skull and upper cervical spine: Craniocervical junction normal. Bone marrow signal intensity within normal limits. Hyperostosis frontalis interna noted. Scalp soft tissues unremarkable. Sinuses/Orbits: Patient status post bilateral ocular lens replacement. Globes orbital soft tissues demonstrate no acute finding. Paranasal sinuses are clear. No mastoid effusion. Inner ear structures normal. Other: None. MRA HEAD FINDINGS ANTERIOR CIRCULATION: Distal cervical segments of the  internal carotid arteries are patent with symmetric antegrade flow. Petrous segments widely patent bilaterally. Scattered atheromatous irregularity within the cavernous/supraclinoid ICAs. No significant stenosis seen on the right. There is a short-segment severe stenosis at the anterior genu of the cavernous left ICA (series 9, image 104). ICA termini well perfused. A1 segments patent bilaterally. Normal anterior communicating artery. Anterior cerebral arteries widely patent to their distal aspects. M1 segments widely patent bilaterally. Normal MCA bifurcations. Distal MCA branches well perfused and symmetric. Mild distal small vessel atheromatous irregularity. POSTERIOR CIRCULATION: Vertebral arteries patent to the vertebrobasilar junction without stenosis. Left vertebral artery dominant. Posterior inferior cerebral arteries patent bilaterally. Basilar patent to its distal aspect without stenosis. Superior cerebral arteries patent bilaterally. Both of the posterior cerebral arteries primarily supplied via the basilar and are well perfused to their distal aspects. No intracranial aneurysm. IMPRESSION: MRI HEAD IMPRESSION: 1. 7 mm acute ischemic nonhemorrhagic infarct involving the deep white matter of the mid-posterior left centrum semi ovale. 2. Otherwise normal brain MRI for age. MRA HEAD IMPRESSION: 1. Negative intracranial MRA for large vessel occlusion. 2. Short-segment severe cavernous left ICA stenosis. 3. Otherwise negative intracranial MRA. No other hemodynamically significant or correctable stenosis. Electronically Signed   By: Jeannine Boga M.D.   On: 10/24/2018 06:59   DG Chest Portable 1 View  Result Date: 10/24/2018 CLINICAL DATA:  TIA EXAM: PORTABLE CHEST 1 VIEW COMPARISON:  08/14/2017 FINDINGS: The heart size and mediastinal contours are within normal limits. Both lungs are clear. Surgical clips over the axilla bilaterally. Aortic atherosclerosis. IMPRESSION: No active disease.  Electronically Signed   By: Donavan Foil M.D.   On: 10/24/2018 00:43   MR MRA HEAD WO CONTRAST  Result Date: 10/24/2018 CLINICAL DATA:  Initial evaluation for new onset right-sided arm and leg weakness. EXAM: MRI HEAD WITHOUT CONTRAST MRA HEAD WITHOUT CONTRAST TECHNIQUE: Multiplanar, multiecho pulse sequences of the brain and surrounding structures were obtained without intravenous contrast. Angiographic images of the head were obtained using MRA technique without contrast. COMPARISON:  Prior CT from earlier the same day. FINDINGS: MRI HEAD FINDINGS Brain: Cerebral volume within normal limits for age. Minimal T2/FLAIR hyperintensity noted within the periventricular white matter, nonspecific, and felt to be within normal limits for age. 7 mm focus of diffusion abnormality seen  at the mid-posterior left centrum semi ovale (series 5, image 80). No associated hemorrhage. No other evidence for acute or subacute ischemia. Gray-white matter differentiation otherwise maintained. No other areas of remote cortical infarction. No foci of susceptibility artifact to suggest acute or chronic intracranial hemorrhage. No mass lesion, midline shift or mass effect. No hydrocephalus. No extra-axial fluid collection. Pituitary gland suprasellar region normal. Midline structures intact. Vascular: Major intracranial vascular flow voids are maintained. Skull and upper cervical spine: Craniocervical junction normal. Bone marrow signal intensity within normal limits. Hyperostosis frontalis interna noted. Scalp soft tissues unremarkable. Sinuses/Orbits: Patient status post bilateral ocular lens replacement. Globes orbital soft tissues demonstrate no acute finding. Paranasal sinuses are clear. No mastoid effusion. Inner ear structures normal. Other: None. MRA HEAD FINDINGS ANTERIOR CIRCULATION: Distal cervical segments of the internal carotid arteries are patent with symmetric antegrade flow. Petrous segments widely patent bilaterally.  Scattered atheromatous irregularity within the cavernous/supraclinoid ICAs. No significant stenosis seen on the right. There is a short-segment severe stenosis at the anterior genu of the cavernous left ICA (series 9, image 104). ICA termini well perfused. A1 segments patent bilaterally. Normal anterior communicating artery. Anterior cerebral arteries widely patent to their distal aspects. M1 segments widely patent bilaterally. Normal MCA bifurcations. Distal MCA branches well perfused and symmetric. Mild distal small vessel atheromatous irregularity. POSTERIOR CIRCULATION: Vertebral arteries patent to the vertebrobasilar junction without stenosis. Left vertebral artery dominant. Posterior inferior cerebral arteries patent bilaterally. Basilar patent to its distal aspect without stenosis. Superior cerebral arteries patent bilaterally. Both of the posterior cerebral arteries primarily supplied via the basilar and are well perfused to their distal aspects. No intracranial aneurysm. IMPRESSION: MRI HEAD IMPRESSION: 1. 7 mm acute ischemic nonhemorrhagic infarct involving the deep white matter of the mid-posterior left centrum semi ovale. 2. Otherwise normal brain MRI for age. MRA HEAD IMPRESSION: 1. Negative intracranial MRA for large vessel occlusion. 2. Short-segment severe cavernous left ICA stenosis. 3. Otherwise negative intracranial MRA. No other hemodynamically significant or correctable stenosis. Electronically Signed   By: Jeannine Boga M.D.   On: 10/24/2018 06:59   ECHOCARDIOGRAM COMPLETE  Result Date: 10/24/2018   ECHOCARDIOGRAM REPORT   Patient Name:   Cynthia Garza Date of Exam: 10/24/2018 Medical Rec #:  [id number]           Height:       61.0 in Accession #:    [id number]          Weight:       135.0 lb Date of Birth:  11/14/1932            BSA:          1.60 m Patient Age:    61 years            BP:           118/57 mmHg Patient Gender: F                   HR:           75 bpm. Exam  Location:  Inpatient  Procedure: 2D Echo Indications:    Stroke 434.91  History:        Patient has no prior history of Echocardiogram examinations.                 Signs/Symptoms: Murmur Risk Factors: Hypertension and                 Dyslipidemia.  Sonographer:  Mikki Santee RDCS (AE) Referring Phys: [place]  1. The left ventricle has hyperdynamic systolic function, with an ejection fraction of >65%. The cavity size was normal. Left ventricular diastolic Doppler parameters are consistent with impaired relaxation.  2. The right ventricle has normal systolic function. The cavity was normal. There is no increase in right ventricular wall thickness.  3. Left atrial size was mildly dilated.  4. The mitral valve is abnormal. Mild thickening of the mitral valve leaflet. There is moderate mitral annular calcification present.  5. The aortic valve is tricuspid. Mild thickening of the aortic valve. Moderate calcification of the aortic valve.  6. The interatrial septum was not assessed. FINDINGS  Left Ventricle: The left ventricle has hyperdynamic systolic function, with an ejection fraction of >65%. The cavity size was normal. The left ventricular wall thickness was not assessed. Left ventricular diastolic Doppler parameters are consistent with  impaired relaxation. Right Ventricle: The right ventricle has normal systolic function. The cavity was normal. There is no increase in right ventricular wall thickness. Left Atrium: Left atrial size was mildly dilated. Right Atrium: Right atrial size was normal in size. Right atrial pressure is estimated at 10 mmHg. Interatrial Septum: The interatrial septum was not assessed. Pericardium: There is no evidence of pericardial effusion. Mitral Valve: The mitral valve is abnormal. Mild thickening of the mitral valve leaflet. There is moderate mitral annular calcification present. Mitral valve regurgitation is trivial by color flow Doppler. Tricuspid Valve:  The tricuspid valve is normal in structure. Tricuspid valve regurgitation is trivial by color flow Doppler. Aortic Valve: The aortic valve is tricuspid Mild thickening of the aortic valve. Moderate calcification of the aortic valve. Aortic valve regurgitation was not visualized by color flow Doppler. Pulmonic Valve: The pulmonic valve was normal in structure. Pulmonic valve regurgitation is not visualized by color flow Doppler. Venous: The inferior vena cava is normal in size with greater than 50% respiratory variability.  +--------------+--------++ LEFT VENTRICLE         +----------------+---------++ +--------------+--------++ Diastology                PLAX 2D                +----------------+---------++ +--------------+--------++ LV e' lateral:  5.98 cm/s LVIDd:        3.60 cm  +----------------+---------++ +--------------+--------++ LV E/e' lateral:18.6      LVIDs:        2.40 cm  +----------------+---------++ +--------------+--------++ LV e' medial:   4.90 cm/s LV PW:        1.00 cm  +----------------+---------++ +--------------+--------++ LV E/e' medial: 22.7      LV IVS:       1.10 cm  +----------------+---------++ +--------------+--------++ LVOT diam:    2.00 cm  +--------------+--------++ LV SV:        34 ml    +--------------+--------++ LV SV Index:  20.93    +--------------+--------++ LVOT Area:    3.14 cm +--------------+--------++                        +--------------+--------++ +---------------+----------++ RIGHT VENTRICLE           +---------------+----------++ RV S prime:    12.10 cm/s +---------------+----------++ TAPSE (M-mode):2.2 cm     +---------------+----------++ RVSP:          24.2 mmHg  +---------------+----------++ +---------------+-------++-----------++ LEFT ATRIUM           Index       +---------------+-------++-----------++ LA diam:  2.80 cm1.75 cm/m  +---------------+-------++-----------++  LA Vol (A2C):  51.5 ml32.22 ml/m +---------------+-------++-----------++ LA Vol (A4C):  56.7 ml35.48 ml/m +---------------+-------++-----------++ LA Biplane Vol:57.8 ml36.17 ml/m +---------------+-------++-----------++ +------------+---------++-----------++ RIGHT ATRIUM         Index       +------------+---------++-----------++ RA Pressure:3.00 mmHg            +------------+---------++-----------++ RA Area:    11.30 cm            +------------+---------++-----------++ RA Volume:  24.70 ml 15.45 ml/m +------------+---------++-----------++  +------------+-----------++ AORTIC VALVE            +------------+-----------++ LVOT Vmax:  122.00 cm/s +------------+-----------++ LVOT Vmean: 86.500 cm/s +------------+-----------++ LVOT VTI:   0.330 m     +------------+-----------++  +-------------+-------++ AORTA                +-------------+-------++ Ao Root diam:3.00 cm +-------------+-------++ +--------------+-----------++ +---------------+-----------++ MITRAL VALVE              TRICUSPID VALVE            +--------------+-----------++ +---------------+-----------++ MV Area (PHT):2.08 cm    TR Peak grad:  21.2 mmHg   +--------------+-----------++ +---------------+-----------++ MV PHT:       106.00 msec TR Vmax:       230.00 cm/s +--------------+-----------++ +---------------+-----------++ +--------------+-----------++ Estimated RAP: 3.00 mmHg   MV E velocity:111.00 cm/s +---------------+-----------++ +--------------+-----------++ RVSP:          24.2 mmHg   MV A velocity:156.00 cm/s +---------------+-----------++ +--------------+-----------++ MV E/A ratio: 0.71        +--------------+-------+ +--------------+-----------++ SHUNTS                                              +--------------+-------+                               Systemic VTI: 0.33 m                                +--------------+-------+                                Systemic Diam:2.00 cm                               +--------------+-------+  Dorris Carnes MD Electronically signed by Dorris Carnes MD Signature Date/Time: 10/24/2018/1:35:21 PM    Final    VAS US CAROTID (at [facility] and [facility] only)  Result Date: 10/25/2018 Carotid Arterial Duplex Study Indications:  CVA. Risk Factors: Hypertension. Performing Technologist: Abram Sander RVS  Examination Guidelines: A complete evaluation includes B-mode imaging, spectral Doppler, color Doppler, and power Doppler as needed of all accessible portions of each vessel. Bilateral testing is considered an integral part of a complete examination. Limited examinations for reoccurring indications may be performed as noted.  Right Carotid Findings: +----------+--------+--------+--------+------------+--------+           PSV cm/sEDV cm/sStenosisDescribe    Comments +----------+--------+--------+--------+------------+--------+ CCA Prox  110     19              heterogenous         +----------+--------+--------+--------+------------+--------+ CCA Distal103  19              heterogenous         +----------+--------+--------+--------+------------+--------+ ICA Prox  73      17      1-39%   heterogenous         +----------+--------+--------+--------+------------+--------+ ICA Distal106     24                                   +----------+--------+--------+--------+------------+--------+ ECA       94      6                                    +----------+--------+--------+--------+------------+--------+ +----------+--------+-------+--------+-------------------+           PSV cm/sEDV cmsDescribeArm Pressure (mmHG) +----------+--------+-------+--------+-------------------+ [id number]                                        +----------+--------+-------+--------+-------------------+ +---------+--------+--+--------+--+---------+ VertebralPSV cm/s37EDV cm/s10Antegrade  +---------+--------+--+--------+--+---------+  Left Carotid Findings: +----------+--------+--------+--------+------------+--------+           PSV cm/sEDV cm/sStenosisDescribe    Comments +----------+--------+--------+--------+------------+--------+ CCA Prox  132     14              heterogenous         +----------+--------+--------+--------+------------+--------+ CCA Distal100     17              heterogenous         +----------+--------+--------+--------+------------+--------+ ICA Prox  107     25      1-39%   heterogenous         +----------+--------+--------+--------+------------+--------+ ICA Distal82      13                                   +----------+--------+--------+--------+------------+--------+ ECA       80                                           +----------+--------+--------+--------+------------+--------+ +----------+--------+--------+--------+-------------------+ SubclavianPSV cm/sEDV cm/sDescribeArm Pressure (mmHG) +----------+--------+--------+--------+-------------------+           138                                         +----------+--------+--------+--------+-------------------+ +---------+--------+--+--------+--+---------+ VertebralPSV cm/s67EDV cm/s17Antegrade +---------+--------+--+--------+--+---------+  Summary: Right Carotid: Velocities in the right ICA are consistent with a 1-39% stenosis. Left Carotid: Velocities in the left ICA are consistent with a 1-39% stenosis. Vertebrals: Bilateral vertebral arteries demonstrate antegrade flow. *See table(s) above for measurements and observations.  Electronically signed by Antony Contras MD on 10/25/2018 at 12:23:35 PM.    Final     Assessment & Plan:     Follow-up: No follow-ups on file.  Walker Kehr, MD

## 2019-07-12 NOTE — Assessment & Plan Note (Signed)
Lorazepam prn  Potential benefits of a long term benzodiazepines  use as well as potential risks  and complications were explained to the patient and were aknowledged.  

## 2019-07-12 NOTE — Assessment & Plan Note (Signed)
Benicar HCT, Amlodipine

## 2019-07-14 ENCOUNTER — Other Ambulatory Visit: Payer: Self-pay | Admitting: Internal Medicine

## 2019-07-14 ENCOUNTER — Ambulatory Visit: Payer: Medicare Other | Admitting: Internal Medicine

## 2019-07-14 DIAGNOSIS — U071 COVID-19: Secondary | ICD-10-CM

## 2019-08-15 ENCOUNTER — Encounter: Payer: Self-pay | Admitting: Internal Medicine

## 2019-08-27 ENCOUNTER — Other Ambulatory Visit: Payer: Self-pay | Admitting: Neurology

## 2019-08-29 ENCOUNTER — Other Ambulatory Visit: Payer: Self-pay

## 2019-08-29 ENCOUNTER — Ambulatory Visit (INDEPENDENT_AMBULATORY_CARE_PROVIDER_SITE_OTHER): Payer: Medicare Other | Admitting: Adult Health

## 2019-08-29 ENCOUNTER — Encounter: Payer: Self-pay | Admitting: Adult Health

## 2019-08-29 VITALS — BP 120/58 | HR 74 | Temp 98.4°F | Ht 61.0 in | Wt 128.0 lb

## 2019-08-29 DIAGNOSIS — Z8673 Personal history of transient ischemic attack (TIA), and cerebral infarction without residual deficits: Secondary | ICD-10-CM | POA: Diagnosis not present

## 2019-08-29 DIAGNOSIS — Z789 Other specified health status: Secondary | ICD-10-CM

## 2019-08-29 DIAGNOSIS — E785 Hyperlipidemia, unspecified: Secondary | ICD-10-CM | POA: Diagnosis not present

## 2019-08-29 DIAGNOSIS — I10 Essential (primary) hypertension: Secondary | ICD-10-CM

## 2019-08-29 MED ORDER — EZETIMIBE 10 MG PO TABS: 10.0000 mg | ORAL_TABLET | Freq: Every day | ORAL | 3 refills | Status: DC

## 2019-08-29 NOTE — Progress Notes (Signed)
[facility] [address] [place]. [place] [address] [phone]       STROKE FOLLOW UP NOTE  Ms. [name] Date of Birth:  1932/07/29 Medical Record Number:  [id number]   Referring MD: Rosalin Hawking Reason for Referral: Stroke  Chief complaint: Chief Complaint  Patient presents with  . Follow-up    treatment room, with daughter, hx of stroke      HPI:   Ms. Zabawa is a 86 year old female who is being seen today, 08/29/2019, for stroke follow-up.  She has been stable since prior visit without new or recurrent stroke/TIA symptoms.  Continues on clopidogrel for secondary stroke prevention without side effects.  Lipid panel obtained after prior visit 02/28/2019 with LDL 129 and recommended increase of pravastatin dosage.  She unfortunately had difficulty tolerating increased dosage due to myalgias therefore statin discontinued and reports resolution of myalgias.  Most recent lipid panel in 03/2019 by PCP showed LDL 96.  Not currently on any treatment regimen.  Blood pressure today 120/58.  Of note, she did have COVID-19 2 months ago and has been gradually recovering with improvement of energy and fatigue levels.  No further concerns at this time.      History: Provided for reference purposes only Update 02/28/2019 Dr. Leonie Man : She returns for follow-up after last visit 3 months ago.  She is accompanied by her daughter.  She states she is doing well.  She is tolerating Plavix well with only mild bruising and no bleeding episodes.  Her blood pressure is well controlled today it is 127/73.  She remains on Pravachol but has not yet had follow-up lipid profile checked.  She had trouble with Zocor in the past and is not too happy with Pravachol either.  I discussed alternatives with her including the new PCSK9 inhibitor injections but she does not like injections either.  She requested refill for Plavix and Pravachol which I gave.  No stroke, TIA or other new  complaints. Initial visit 11/23/2018 Dr. Leonie Man:  Ms. Trauger is a pleasant 86 year old Caucasian lady seen today for initial office consultation visit for stroke.  History is obtained from the patient, her daughter was accompanying her as well as review of electronic medical records and have personally reviewed imaging films in PACS.  She has past medical history of hypertension, hyperlipidemia, depression, gastroesophageal reflux disease osteoporosis who presented on 10/24/2018 with right-sided weakness which she noticed the night before.  She was preparing to go to sleep when she noticed that she had no control of her right upper extremity which was weak and heavy and numb.  She called her daughter and the daughter noticed that while coming to the hospital she was also dragging her right foot but the patient does not remember this.  She started getting better shortly after arrival to the hospital.  CT scan of the head was obtained which was unremarkable.  MRI scan of the brain showed a small 7 mm lacunar infarct in the left centrum semiovale mid posterior white matter.  Carotid ultrasound ultrasound showed no significant extracranial stenosis.  Transthoracic echo showed normal ejection fraction.  LDL cholesterol was elevated at 91 mg percent.  Hemoglobin A1c was 5.6.  MRI of the brain showed short segment severe stenosis of left cavernous carotid.  Patient was started on dual antiplatelet therapy and on protocol.  She states she is done well since discharge.  She has had no recurrent stroke or TIA symptoms.  Right-sided strength is returned back to  normal.  She is still on aspirin and Plavix and tolerating them well without significant bleeding or bruising.  She is also tolerating Pravachol without muscle aches and pains.  She has had no new neurological complaints.  She has no prior history of TIAs or strokes or other neurological problems.   ROS:   14 system review of systems is positive for fatigue post  COVID and all other systems negative   PMH:  Past Medical History:  Diagnosis Date  . Anxiety   . Breast cancer ([place]) 2015   ER+/PR+/Her2-  . Depression   . Dyslipidemia   . GERD (gastroesophageal reflux disease)   . Heart murmur   . Hemangioma of intra-abdominal structures   . HTN (hypertension)   . Insomnia   . LBP (low back pain)   . Osteoporosis   . Paresthesia   . Radiation 08/08/13-08/29/13   Bilat.breast 42.72 Gy  . Renal cyst   . Shingles     Social History:  Social History   Socioeconomic History  . Marital status: Married    Spouse name: Not on file  . Number of children: 2  . Years of education: Not on file  . Highest education level: Not on file  Occupational History  . Occupation: Retired    Fish farm manager: RETIRED    Comment: [employer]  Tobacco Use  . Smoking status: Never Smoker  . Smokeless tobacco: Never Used  Substance and Sexual Activity  . Alcohol use: No  . Drug use: No  . Sexual activity: Yes    Comment: menarche age 76, first live birth [age] yrs of age, P2, menopause 58, no HRT  Other Topics Concern  . Not on file  Social History Narrative  . Not on file   Social Determinants of Health   Financial Resource Strain:   . Difficulty of Paying Living Expenses:   Food Insecurity:   . Worried About Charity fundraiser in the Last Year:   . Arboriculturist in the Last Year:   Transportation Needs:   . Film/video editor (Medical):   Marland Kitchen Lack of Transportation (Non-Medical):   Physical Activity:   . Days of Exercise per Week:   . Minutes of Exercise per Session:   Stress:   . Feeling of Stress :   Social Connections:   . Frequency of Communication with Friends and Family:   . Frequency of Social Gatherings with Friends and Family:   . Attends Religious Services:   . Active Member of Clubs or Organizations:   . Attends Archivist Meetings:   Marland Kitchen Marital Status:   Intimate Partner Violence:   . Fear of Current or Ex-Partner:    . Emotionally Abused:   Marland Kitchen Physically Abused:   . Sexually Abused:     Medications:   Current Outpatient Medications on File Prior to Visit  Medication Sig Dispense Refill  . albuterol (PROVENTIL HFA) 108 (90 Base) MCG/ACT inhaler Inhale 2 puffs into the lungs every 4 (four) hours as needed for wheezing or shortness of breath. 8.5 g 3  . amLODipine (NORVASC) 5 MG tablet TAKE 1 TABLET BY MOUTH EVERY DAY 90 tablet 3  . anastrozole (ARIMIDEX) 1 MG tablet Take 1 mg by mouth daily.    Marland Kitchen azithromycin (ZITHROMAX Z-PAK) 250 MG tablet As directed 6 tablet 0  . BIOTIN PO Take by mouth.    Marland Kitchen buPROPion (WELLBUTRIN XL) 150 MG 24 hr tablet TAKE 1 TABLET BY MOUTH EVERY  DAY 90 tablet 3  . Cholecalciferol (VITAMIN D3) 50 MCG (2000 UT) capsule Take 1 capsule (2,000 Units total) by mouth daily. 100 capsule 3  . clopidogrel (PLAVIX) 75 MG tablet TAKE 1 TABLET BY MOUTH EVERY DAY 90 tablet 0  . Coenzyme Q10 (CO Q-10 PO) Take by mouth.    . Famotidine (PEPCID PO) Take by mouth.    . meclizine (ANTIVERT) 12.5 MG tablet Take 1 tablet (12.5 mg total) by mouth 2 (two) times daily as needed for dizziness. 60 tablet 2  . methylPREDNISolone (MEDROL DOSEPAK) 4 MG TBPK tablet As directed 21 tablet 0  . Multiple Vitamins-Minerals (ALIVE ONCE DAILY WOMENS 50+) TABS Take 1 tablet by mouth daily.    . Multiple Vitamins-Minerals (PRESERVISION AREDS 2 PO) Take 1 tablet by mouth 2 (two) times daily.    Marland Kitchen olmesartan-hydrochlorothiazide (BENICAR HCT) 40-25 MG tablet Take 1 tablet by mouth daily. 90 tablet 3  . Propylene Glycol (SYSTANE BALANCE) 0.6 % SOLN Place 2 drops into both eyes at bedtime.    . vitamin B-12 (CYANOCOBALAMIN) 1000 MCG tablet Take 1,000 mcg by mouth daily.    Marland Kitchen zolpidem (AMBIEN) 10 MG tablet Take 1 tablet (10 mg total) by mouth at bedtime as needed for sleep. 90 tablet 1   No current facility-administered medications on file prior to visit.    Allergies:   Allergies  Allergen Reactions  . Cholestoff  [Plant Sterols And Stanols]     unknown  . Lexapro [Escitalopram Oxalate]     fatigue  . Simvastatin Other (See Comments)    aches  . Diflucan [Fluconazole] Rash    Physical Exam  Today's Vitals   08/29/19 1506  BP: (!) 120/58  Pulse: 74  Temp: 98.4 F (36.9 C)  Weight: 128 lb (58.1 kg)  Height: _0  (1.549 m)   Body mass index is 24.19 kg/m.  General: Pleasant elderly Caucasian lady, seated, in no evident distress Head: head normocephalic and atraumatic.   Neck: supple with no carotid or supraclavicular bruits Cardiovascular: regular rate and rhythm, no murmurs Musculoskeletal: no deformity Skin:  no rash/petichiae Vascular:  Normal pulses all extremities  Neurologic Exam Mental Status: Awake and fully alert.  Normal speech and language.  Oriented to place and time. Recent and remote memory intact. Attention span, concentration and fund of knowledge appropriate. Mood and affect appropriate.  Cranial Nerves: Pupils equal, briskly reactive to light. Extraocular movements full without nystagmus. Visual fields full to confrontation. Hearing mildly diminished bilaterally facial sensation intact. Face, tongue, palate moves normally and symmetrically.  Motor: Normal bulk and tone. Normal strength in all tested extremity muscles except slightly decreased right hand grip strength.  diminished fine finger movements on the right.  Orbits left over right upper extremity.   Sensory.: intact to touch , pinprick , position and vibratory sensation.  Coordination: Rapid alternating movements normal in all extremities. Finger-to-nose and heel-to-shin performed accurately bilaterally. Gait and Station: Arises from chair without difficulty. Stance is normal. Gait demonstrates normal stride length and balance Reflexes: 1+ and symmetric. Toes downgoing.       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  Vascular risk factors of age hypertension hyperlipidemia and cavernous carotid stenosis.  Has been stable since with residual RUE decreased dexterity     PLAN: Continue Plavix 75 mg daily and recommend initiating Zetia 10 mg daily for secondary stroke prevention.  Prior use of statins with difficulty tolerating due to myalgias Follow-up with PCP for HTN and HLD management as well as request of repeat lipid panel at next follow-up visit to ensure satisfactory management.  May need to consider PCSK9 inhibitor if indicated maintain strict control of hypertension with blood pressure goal below 130/90, diabetes with hemoglobin A1c goal below 6.5% and lipids with LDL cholesterol goal below 70 mg/dL. I also advised the patient to eat a healthy diet with plenty of whole grains, cereals, fruits and vegetables, exercise regularly and maintain ideal body weight.   Overall stable from stroke standpoint recommend follow-up as needed in regards to prior stroke   I spent 23 minutes of face-to-face and non-face-to-face time with patient.  This included previsit chart review, lab review, study review, order entry, electronic health record documentation, patient education regarding prior stroke, importance of managing stroke risk factors and answered all questions to patient and daughter satisfaction    Frann Rider, AGNP-BC  [facility] [address] [place], [place] [address]  Phone [phone] Fax [phone] Note: This document was prepared with digital dictation and possible smart phrase technology. Any transcriptional errors that result from this process are unintentional.

## 2019-08-29 NOTE — Patient Instructions (Signed)
Continue clopidogrel 75 mg daily and starting Zetia 10 mg daily for secondary stroke prevention  Continue to follow up with PCP regarding cholesterol and blood pressure management   Continue to monitor blood pressure at home  Maintain strict control of hypertension with blood pressure goal below 130/90, diabetes with hemoglobin A1c goal below 6.5% and cholesterol with LDL cholesterol (bad cholesterol) goal below 70 mg/dL. I also advised the patient to eat a healthy diet with plenty of whole grains, cereals, fruits and vegetables, exercise regularly and maintain ideal body weight.  As you have been doing well from a stroke standpoint, please follow-up as needed and call office with any questions or concerns regarding your prior stroke        Thank you for coming to see [place] at [facility]. I hope we have been able to provide you high quality care today.  You may receive a patient satisfaction survey over the next few weeks. We would appreciate your feedback and comments so that we may continue to improve ourselves and the health of our patients.   Ezetimibe Tablets What is this medicine? EZETIMIBE (ez ET i mibe) blocks the absorption of cholesterol from the stomach. It can help lower blood cholesterol for patients who are at risk of getting heart disease or a stroke. It is only for patients whose cholesterol level is not controlled by diet. This medicine may be used for other purposes; ask your health care provider or pharmacist if you have questions. COMMON BRAND NAME(S): Zetia What should I tell my health care provider before I take this medicine? They need to know if you have any of these conditions:  liver disease  an unusual or allergic reaction to ezetimibe, medicines, foods, dyes, or preservatives  pregnant or trying to get pregnant  breast-feeding How should I use this medicine? Take this medicine by mouth with a glass of water. Follow the directions on  the prescription label. This medicine can be taken with or without food. Take your doses at regular intervals. Do not take your medicine more often than directed. Talk to your pediatrician regarding the use of this medicine in children. Special care may be needed. Overdosage: If you think you have taken too much of this medicine contact a poison control center or emergency room at once. NOTE: This medicine is only for you. Do not share this medicine with others. What if I miss a dose? If you miss a dose, take it as soon as you can. If it is almost time for your next dose, take only that dose. Do not take double or extra doses. What may interact with this medicine? Do not take this medicine with any of the following medications:  fenofibrate  gemfibrozil This medicine may also interact with the following medications:  antacids  cyclosporine  herbal medicines like red yeast rice  other medicines to lower cholesterol or triglycerides This list may not describe all possible interactions. Give your health care provider a list of all the medicines, herbs, non-prescription drugs, or dietary supplements you use. Also tell them if you smoke, drink alcohol, or use illegal drugs. Some items may interact with your medicine. What should I watch for while using this medicine? Visit your doctor or health care professional for regular checks on your progress. You will need to have your cholesterol levels checked. If you are also taking some other cholesterol medicines, you will also need to have tests to make sure your liver is working properly. Tell  your doctor or health care professional if you get any unexplained muscle pain, tenderness, or weakness, especially if you also have a fever and tiredness. You need to follow a low-cholesterol, low-fat diet while you are taking this medicine. This will decrease your risk of getting heart and blood vessel disease. Exercising and avoiding alcohol and smoking can  also help. Ask your doctor or dietician for advice. What side effects may I notice from receiving this medicine? Side effects that you should report to your doctor or health care professional as soon as possible:  allergic reactions like skin rash, itching or hives, swelling of the face, lips, or tongue  dark yellow or brown urine  unusually weak or tired  yellowing of the skin or eyes Side effects that usually do not require medical attention (report to your doctor or health care professional if they continue or are bothersome):  diarrhea  dizziness  headache  stomach upset or pain This list may not describe all possible side effects. Call your doctor for medical advice about side effects. You may report side effects to FDA at 1-800-FDA-1088. Where should I keep my medicine? Keep out of the reach of children. Store at room temperature between 15 and 30 degrees C (59 and 86 degrees F). Protect from moisture. Keep container tightly closed. Throw away any unused medicine after the expiration date. NOTE: This sheet is a summary. It may not cover all possible information. If you have questions about this medicine, talk to your doctor, pharmacist, or health care provider.  2020 Elsevier/Gold Standard (2011-11-17 15:39:09)

## 2019-08-29 NOTE — Progress Notes (Signed)
I agree with the above plan 

## 2019-08-31 ENCOUNTER — Other Ambulatory Visit: Payer: Self-pay

## 2019-08-31 MED ORDER — CLOPIDOGREL BISULFATE 75 MG PO TABS: 75.0000 mg | ORAL_TABLET | Freq: Every day | ORAL | 1 refills | Status: DC

## 2019-10-12 ENCOUNTER — Other Ambulatory Visit: Payer: Self-pay

## 2019-10-12 ENCOUNTER — Ambulatory Visit (INDEPENDENT_AMBULATORY_CARE_PROVIDER_SITE_OTHER): Payer: Medicare Other | Admitting: Internal Medicine

## 2019-10-12 ENCOUNTER — Ambulatory Visit (INDEPENDENT_AMBULATORY_CARE_PROVIDER_SITE_OTHER): Payer: Medicare Other

## 2019-10-12 ENCOUNTER — Encounter: Payer: Self-pay | Admitting: Internal Medicine

## 2019-10-12 VITALS — BP 128/68 | HR 69 | Temp 98.4°F | Ht 61.0 in | Wt 128.0 lb

## 2019-10-12 VITALS — BP 128/68 | HR 69 | Temp 98.4°F | Resp 16 | Ht 61.0 in | Wt 128.0 lb

## 2019-10-12 DIAGNOSIS — F32A Depression, unspecified: Secondary | ICD-10-CM

## 2019-10-12 DIAGNOSIS — F329 Major depressive disorder, single episode, unspecified: Secondary | ICD-10-CM

## 2019-10-12 DIAGNOSIS — E785 Hyperlipidemia, unspecified: Secondary | ICD-10-CM | POA: Diagnosis not present

## 2019-10-12 DIAGNOSIS — I63232 Cerebral infarction due to unspecified occlusion or stenosis of left carotid arteries: Secondary | ICD-10-CM | POA: Diagnosis not present

## 2019-10-12 DIAGNOSIS — R29898 Other symptoms and signs involving the musculoskeletal system: Secondary | ICD-10-CM | POA: Insufficient documentation

## 2019-10-12 DIAGNOSIS — Z0000 Encounter for general adult medical examination without abnormal findings: Secondary | ICD-10-CM

## 2019-10-12 MED ORDER — METHYLPREDNISOLONE 4 MG PO TBPK: ORAL_TABLET | ORAL | 0 refills | Status: DC

## 2019-10-12 MED ORDER — MECLIZINE HCL 12.5 MG PO TABS: 12.5000 mg | ORAL_TABLET | Freq: Two times a day (BID) | ORAL | 2 refills | Status: DC | PRN

## 2019-10-12 NOTE — Assessment & Plan Note (Addendum)
Zetia - too $$$ Pravastatin - re-start if no side effects

## 2019-10-12 NOTE — Assessment & Plan Note (Addendum)
R foot is feeling weaker now  - post-CVA vs other PT Medrol pack - empiric for OA, poss radiculopathy Kasandra Knudsen

## 2019-10-12 NOTE — Assessment & Plan Note (Addendum)
2020 Post CVA R sided weakness R foot is feeling weaker now PT Plavix, Pravachol

## 2019-10-12 NOTE — Assessment & Plan Note (Signed)
Coping ok w/grief

## 2019-10-12 NOTE — Progress Notes (Signed)
Subjective:  Patient ID: Cynthia Garza, female    DOB: May 26, 1933  Age: 86 y.o. MRN: [id number]  CC: No chief complaint on file.   HPI SKYLAA CLYMER presents for depression and grief, HTN, B12 def C/o R leg and foot weakness, afraid to trip or fall. Kelly died in 09-10-2019  Outpatient Medications Prior to Visit  Medication Sig Dispense Refill  . albuterol (PROVENTIL HFA) 108 (90 Base) MCG/ACT inhaler Inhale 2 puffs into the lungs every 4 (four) hours as needed for wheezing or shortness of breath. 8.5 g 3  . amLODipine (NORVASC) 5 MG tablet TAKE 1 TABLET BY MOUTH EVERY DAY 90 tablet 3  . anastrozole (ARIMIDEX) 1 MG tablet Take 1 mg by mouth daily.    Marland Kitchen BIOTIN PO Take by mouth.    Marland Kitchen buPROPion (WELLBUTRIN XL) 150 MG 24 hr tablet TAKE 1 TABLET BY MOUTH EVERY DAY 90 tablet 3  . Cholecalciferol (VITAMIN D3) 50 MCG (2000 UT) capsule Take 1 capsule (2,000 Units total) by mouth daily. 100 capsule 3  . clopidogrel (PLAVIX) 75 MG tablet Take 1 tablet (75 mg total) by mouth daily. 90 tablet 1  . Coenzyme Q10 (CO Q-10 PO) Take by mouth.    . ezetimibe (ZETIA) 10 MG tablet Take 1 tablet (10 mg total) by mouth daily. 90 tablet 3  . Famotidine (PEPCID PO) Take by mouth.    . Multiple Vitamins-Minerals (ALIVE ONCE DAILY WOMENS 50+) TABS Take 1 tablet by mouth daily.    . Multiple Vitamins-Minerals (PRESERVISION AREDS 2 PO) Take 1 tablet by mouth 2 (two) times daily.    Marland Kitchen olmesartan-hydrochlorothiazide (BENICAR HCT) 40-25 MG tablet Take 1 tablet by mouth daily. 90 tablet 3  . Propylene Glycol (SYSTANE BALANCE) 0.6 % SOLN Place 2 drops into both eyes at bedtime.    . vitamin B-12 (CYANOCOBALAMIN) 1000 MCG tablet Take 1,000 mcg by mouth daily.    Marland Kitchen zolpidem (AMBIEN) 10 MG tablet Take 1 tablet (10 mg total) by mouth at bedtime as needed for sleep. 90 tablet 1  . meclizine (ANTIVERT) 12.5 MG tablet Take 1 tablet (12.5 mg total) by mouth 2 (two) times daily as needed for dizziness. 60 tablet 2  .  azithromycin (ZITHROMAX Z-PAK) 250 MG tablet As directed 6 tablet 0  . methylPREDNISolone (MEDROL DOSEPAK) 4 MG TBPK tablet As directed 21 tablet 0   No facility-administered medications prior to visit.    ROS: Review of Systems  Constitutional: Negative for activity change, appetite change, chills, fatigue and unexpected weight change.  HENT: Negative for congestion, mouth sores and sinus pressure.   Eyes: Negative for visual disturbance.  Respiratory: Negative for cough and chest tightness.   Gastrointestinal: Negative for abdominal pain and nausea.  Genitourinary: Negative for difficulty urinating, frequency and vaginal pain.  Musculoskeletal: Positive for arthralgias and gait problem. Negative for back pain.  Skin: Negative for pallor and rash.  Neurological: Negative for dizziness, tremors, weakness, numbness and headaches.  Psychiatric/Behavioral: Negative for confusion and sleep disturbance. The patient is nervous/anxious.     Objective:  BP 128/68 (BP Location: Left Arm, Patient Position: Sitting, Cuff Size: Normal)   Pulse 69   Temp 98.4 F (36.9 C) (Oral)   Ht 5\' 1"  (1.549 m)   Wt 128 lb (58.1 kg)   SpO2 98%   BMI 24.19 kg/m   BP Readings from Last 3 Encounters:  10/12/19 128/68  08/29/19 (!) 120/58  07/12/19 122/74    Wt Readings from Last  3 Encounters:  10/12/19 128 lb (58.1 kg)  08/29/19 128 lb (58.1 kg)  07/12/19 130 lb 6 oz (59.1 kg)    Physical Exam Constitutional:      General: She is not in acute distress.    Appearance: She is well-developed.  HENT:     Head: Normocephalic.     Right Ear: External ear normal.     Left Ear: External ear normal.     Nose: Nose normal.  Eyes:     General:        Right eye: No discharge.        Left eye: No discharge.     Conjunctiva/sclera: Conjunctivae normal.     Pupils: Pupils are equal, round, and reactive to light.  Neck:     Thyroid: No thyromegaly.     Vascular: No JVD.     Trachea: No tracheal  deviation.  Cardiovascular:     Rate and Rhythm: Normal rate and regular rhythm.     Heart sounds: Normal heart sounds.  Pulmonary:     Effort: No respiratory distress.     Breath sounds: No stridor. No wheezing.  Abdominal:     General: Bowel sounds are normal. There is no distension.     Palpations: Abdomen is soft. There is no mass.     Tenderness: There is no abdominal tenderness. There is no guarding or rebound.  Musculoskeletal:        General: No tenderness.     Cervical back: Normal range of motion and neck supple.  Lymphadenopathy:     Cervical: No cervical adenopathy.  Skin:    Findings: No erythema or rash.  Neurological:     Mental Status: She is oriented to person, place, and time.     Cranial Nerves: No cranial nerve deficit.     Motor: Weakness present. No abnormal muscle tone.     Coordination: Coordination abnormal.     Gait: Gait abnormal.     Deep Tendon Reflexes: Reflexes normal.  Psychiatric:        Behavior: Behavior normal.        Thought Content: Thought content normal.        Judgment: Judgment normal.   no obvious foot drop or weakness Mild ataxia  Lab Results  Component Value Date   WBC 7.9 10/24/2018   HGB 12.9 10/24/2018   HCT 38.0 10/24/2018   PLT 206 10/24/2018   GLUCOSE 97 04/13/2019   CHOL 188 04/13/2019   TRIG 241.0 (H) 04/13/2019   HDL 53.20 04/13/2019   LDLDIRECT 96.0 04/13/2019   LDLCALC 129 (H) 02/28/2019   ALT 9 04/13/2019   AST 16 04/13/2019   NA 141 04/13/2019   K 3.9 04/13/2019   CL 102 04/13/2019   CREATININE 0.92 04/13/2019   BUN 23 04/13/2019   CO2 27 04/13/2019   TSH 1.97 12/18/2015   INR 1.0 10/24/2018   HGBA1C 5.6 10/24/2018    CT Head Wo Contrast  Result Date: 10/24/2018 CLINICAL DATA:  Right upper extremity heaviness and lower extremity numbness EXAM: CT HEAD WITHOUT CONTRAST TECHNIQUE: Contiguous axial images were obtained from the base of the skull through the vertex without intravenous contrast.  COMPARISON:  None. FINDINGS: Brain: No acute territorial infarction, hemorrhage, or intracranial mass. The ventricles are nonenlarged. Vascular: No hyperdense vessels.  Carotid vascular calcification. Skull: Normal. Negative for fracture or focal lesion. Sinuses/Orbits: No acute finding. Other: Chronic appearing abnormality at the anterior arch of C1, incompletely visualized. Prominent degenerative  changes at the C1-C2 articulation. IMPRESSION: Negative non contrasted CT appearance of the brain.  Mild atrophy Electronically Signed   By: Donavan Foil M.D.   On: 10/24/2018 01:00   MR BRAIN WO CONTRAST  Result Date: 10/24/2018 CLINICAL DATA:  Initial evaluation for new onset right-sided arm and leg weakness. EXAM: MRI HEAD WITHOUT CONTRAST MRA HEAD WITHOUT CONTRAST TECHNIQUE: Multiplanar, multiecho pulse sequences of the brain and surrounding structures were obtained without intravenous contrast. Angiographic images of the head were obtained using MRA technique without contrast. COMPARISON:  Prior CT from earlier the same day. FINDINGS: MRI HEAD FINDINGS Brain: Cerebral volume within normal limits for age. Minimal T2/FLAIR hyperintensity noted within the periventricular white matter, nonspecific, and felt to be within normal limits for age. 7 mm focus of diffusion abnormality seen at the mid-posterior left centrum semi ovale (series 5, image 80). No associated hemorrhage. No other evidence for acute or subacute ischemia. Gray-white matter differentiation otherwise maintained. No other areas of remote cortical infarction. No foci of susceptibility artifact to suggest acute or chronic intracranial hemorrhage. No mass lesion, midline shift or mass effect. No hydrocephalus. No extra-axial fluid collection. Pituitary gland suprasellar region normal. Midline structures intact. Vascular: Major intracranial vascular flow voids are maintained. Skull and upper cervical spine: Craniocervical junction normal. Bone marrow  signal intensity within normal limits. Hyperostosis frontalis interna noted. Scalp soft tissues unremarkable. Sinuses/Orbits: Patient status post bilateral ocular lens replacement. Globes orbital soft tissues demonstrate no acute finding. Paranasal sinuses are clear. No mastoid effusion. Inner ear structures normal. Other: None. MRA HEAD FINDINGS ANTERIOR CIRCULATION: Distal cervical segments of the internal carotid arteries are patent with symmetric antegrade flow. Petrous segments widely patent bilaterally. Scattered atheromatous irregularity within the cavernous/supraclinoid ICAs. No significant stenosis seen on the right. There is a short-segment severe stenosis at the anterior genu of the cavernous left ICA (series 9, image 104). ICA termini well perfused. A1 segments patent bilaterally. Normal anterior communicating artery. Anterior cerebral arteries widely patent to their distal aspects. M1 segments widely patent bilaterally. Normal MCA bifurcations. Distal MCA branches well perfused and symmetric. Mild distal small vessel atheromatous irregularity. POSTERIOR CIRCULATION: Vertebral arteries patent to the vertebrobasilar junction without stenosis. Left vertebral artery dominant. Posterior inferior cerebral arteries patent bilaterally. Basilar patent to its distal aspect without stenosis. Superior cerebral arteries patent bilaterally. Both of the posterior cerebral arteries primarily supplied via the basilar and are well perfused to their distal aspects. No intracranial aneurysm. IMPRESSION: MRI HEAD IMPRESSION: 1. 7 mm acute ischemic nonhemorrhagic infarct involving the deep white matter of the mid-posterior left centrum semi ovale. 2. Otherwise normal brain MRI for age. MRA HEAD IMPRESSION: 1. Negative intracranial MRA for large vessel occlusion. 2. Short-segment severe cavernous left ICA stenosis. 3. Otherwise negative intracranial MRA. No other hemodynamically significant or correctable stenosis.  Electronically Signed   By: Jeannine Boga M.D.   On: 10/24/2018 06:59   DG Chest Portable 1 View  Result Date: 10/24/2018 CLINICAL DATA:  TIA EXAM: PORTABLE CHEST 1 VIEW COMPARISON:  08/14/2017 FINDINGS: The heart size and mediastinal contours are within normal limits. Both lungs are clear. Surgical clips over the axilla bilaterally. Aortic atherosclerosis. IMPRESSION: No active disease. Electronically Signed   By: Donavan Foil M.D.   On: 10/24/2018 00:43   MR MRA HEAD WO CONTRAST  Result Date: 10/24/2018 CLINICAL DATA:  Initial evaluation for new onset right-sided arm and leg weakness. EXAM: MRI HEAD WITHOUT CONTRAST MRA HEAD WITHOUT CONTRAST TECHNIQUE: Multiplanar, multiecho pulse sequences of  the brain and surrounding structures were obtained without intravenous contrast. Angiographic images of the head were obtained using MRA technique without contrast. COMPARISON:  Prior CT from earlier the same day. FINDINGS: MRI HEAD FINDINGS Brain: Cerebral volume within normal limits for age. Minimal T2/FLAIR hyperintensity noted within the periventricular white matter, nonspecific, and felt to be within normal limits for age. 7 mm focus of diffusion abnormality seen at the mid-posterior left centrum semi ovale (series 5, image 80). No associated hemorrhage. No other evidence for acute or subacute ischemia. Gray-white matter differentiation otherwise maintained. No other areas of remote cortical infarction. No foci of susceptibility artifact to suggest acute or chronic intracranial hemorrhage. No mass lesion, midline shift or mass effect. No hydrocephalus. No extra-axial fluid collection. Pituitary gland suprasellar region normal. Midline structures intact. Vascular: Major intracranial vascular flow voids are maintained. Skull and upper cervical spine: Craniocervical junction normal. Bone marrow signal intensity within normal limits. Hyperostosis frontalis interna noted. Scalp soft tissues unremarkable.  Sinuses/Orbits: Patient status post bilateral ocular lens replacement. Globes orbital soft tissues demonstrate no acute finding. Paranasal sinuses are clear. No mastoid effusion. Inner ear structures normal. Other: None. MRA HEAD FINDINGS ANTERIOR CIRCULATION: Distal cervical segments of the internal carotid arteries are patent with symmetric antegrade flow. Petrous segments widely patent bilaterally. Scattered atheromatous irregularity within the cavernous/supraclinoid ICAs. No significant stenosis seen on the right. There is a short-segment severe stenosis at the anterior genu of the cavernous left ICA (series 9, image 104). ICA termini well perfused. A1 segments patent bilaterally. Normal anterior communicating artery. Anterior cerebral arteries widely patent to their distal aspects. M1 segments widely patent bilaterally. Normal MCA bifurcations. Distal MCA branches well perfused and symmetric. Mild distal small vessel atheromatous irregularity. POSTERIOR CIRCULATION: Vertebral arteries patent to the vertebrobasilar junction without stenosis. Left vertebral artery dominant. Posterior inferior cerebral arteries patent bilaterally. Basilar patent to its distal aspect without stenosis. Superior cerebral arteries patent bilaterally. Both of the posterior cerebral arteries primarily supplied via the basilar and are well perfused to their distal aspects. No intracranial aneurysm. IMPRESSION: MRI HEAD IMPRESSION: 1. 7 mm acute ischemic nonhemorrhagic infarct involving the deep white matter of the mid-posterior left centrum semi ovale. 2. Otherwise normal brain MRI for age. MRA HEAD IMPRESSION: 1. Negative intracranial MRA for large vessel occlusion. 2. Short-segment severe cavernous left ICA stenosis. 3. Otherwise negative intracranial MRA. No other hemodynamically significant or correctable stenosis. Electronically Signed   By: Jeannine Boga M.D.   On: 10/24/2018 06:59   ECHOCARDIOGRAM COMPLETE  Result Date:  10/24/2018   ECHOCARDIOGRAM REPORT   Patient Name:   DAWNIEL LASICH Date of Exam: 10/24/2018 Medical Rec #:  [id number]           Height:       61.0 in Accession #:    [id number]          Weight:       135.0 lb Date of Birth:  03-28-1933            BSA:          1.60 m Patient Age:    60 years            BP:           118/57 mmHg Patient Gender: F                   HR:           75 bpm. Exam Location:  Inpatient  Procedure:  2D Echo Indications:    Stroke 434.91  History:        Patient has no prior history of Echocardiogram examinations.                 Signs/Symptoms: Murmur Risk Factors: Hypertension and                 Dyslipidemia.  Sonographer:    Mikki Santee RDCS (AE) Referring Phys: [place]  1. The left ventricle has hyperdynamic systolic function, with an ejection fraction of >65%. The cavity size was normal. Left ventricular diastolic Doppler parameters are consistent with impaired relaxation.  2. The right ventricle has normal systolic function. The cavity was normal. There is no increase in right ventricular wall thickness.  3. Left atrial size was mildly dilated.  4. The mitral valve is abnormal. Mild thickening of the mitral valve leaflet. There is moderate mitral annular calcification present.  5. The aortic valve is tricuspid. Mild thickening of the aortic valve. Moderate calcification of the aortic valve.  6. The interatrial septum was not assessed. FINDINGS  Left Ventricle: The left ventricle has hyperdynamic systolic function, with an ejection fraction of >65%. The cavity size was normal. The left ventricular wall thickness was not assessed. Left ventricular diastolic Doppler parameters are consistent with  impaired relaxation. Right Ventricle: The right ventricle has normal systolic function. The cavity was normal. There is no increase in right ventricular wall thickness. Left Atrium: Left atrial size was mildly dilated. Right Atrium: Right atrial size was normal  in size. Right atrial pressure is estimated at 10 mmHg. Interatrial Septum: The interatrial septum was not assessed. Pericardium: There is no evidence of pericardial effusion. Mitral Valve: The mitral valve is abnormal. Mild thickening of the mitral valve leaflet. There is moderate mitral annular calcification present. Mitral valve regurgitation is trivial by color flow Doppler. Tricuspid Valve: The tricuspid valve is normal in structure. Tricuspid valve regurgitation is trivial by color flow Doppler. Aortic Valve: The aortic valve is tricuspid Mild thickening of the aortic valve. Moderate calcification of the aortic valve. Aortic valve regurgitation was not visualized by color flow Doppler. Pulmonic Valve: The pulmonic valve was normal in structure. Pulmonic valve regurgitation is not visualized by color flow Doppler. Venous: The inferior vena cava is normal in size with greater than 50% respiratory variability.  +--------------+--------++ LEFT VENTRICLE         +----------------+---------++ +--------------+--------++ Diastology                PLAX 2D                +----------------+---------++ +--------------+--------++ LV e' lateral:  5.98 cm/s LVIDd:        3.60 cm  +----------------+---------++ +--------------+--------++ LV E/e' lateral:18.6      LVIDs:        2.40 cm  +----------------+---------++ +--------------+--------++ LV e' medial:   4.90 cm/s LV PW:        1.00 cm  +----------------+---------++ +--------------+--------++ LV E/e' medial: 22.7      LV IVS:       1.10 cm  +----------------+---------++ +--------------+--------++ LVOT diam:    2.00 cm  +--------------+--------++ LV SV:        34 ml    +--------------+--------++ LV SV Index:  20.93    +--------------+--------++ LVOT Area:    3.14 cm +--------------+--------++                        +--------------+--------++ +---------------+----------++  RIGHT VENTRICLE            +---------------+----------++ RV S prime:    12.10 cm/s +---------------+----------++ TAPSE (M-mode):2.2 cm     +---------------+----------++ RVSP:          24.2 mmHg  +---------------+----------++ +---------------+-------++-----------++ LEFT ATRIUM           Index       +---------------+-------++-----------++ LA diam:       2.80 cm1.75 cm/m  +---------------+-------++-----------++ LA Vol (A2C):  51.5 ml32.22 ml/m +---------------+-------++-----------++ LA Vol (A4C):  56.7 ml35.48 ml/m +---------------+-------++-----------++ LA Biplane Vol:57.8 ml36.17 ml/m +---------------+-------++-----------++ +------------+---------++-----------++ RIGHT ATRIUM         Index       +------------+---------++-----------++ RA Pressure:3.00 mmHg            +------------+---------++-----------++ RA Area:    11.30 cm            +------------+---------++-----------++ RA Volume:  24.70 ml 15.45 ml/m +------------+---------++-----------++  +------------+-----------++ AORTIC VALVE            +------------+-----------++ LVOT Vmax:  122.00 cm/s +------------+-----------++ LVOT Vmean: 86.500 cm/s +------------+-----------++ LVOT VTI:   0.330 m     +------------+-----------++  +-------------+-------++ AORTA                +-------------+-------++ Ao Root diam:3.00 cm +-------------+-------++ +--------------+-----------++ +---------------+-----------++ MITRAL VALVE              TRICUSPID VALVE            +--------------+-----------++ +---------------+-----------++ MV Area (PHT):2.08 cm    TR Peak grad:  21.2 mmHg   +--------------+-----------++ +---------------+-----------++ MV PHT:       106.00 msec TR Vmax:       230.00 cm/s +--------------+-----------++ +---------------+-----------++ +--------------+-----------++ Estimated RAP: 3.00 mmHg   MV E velocity:111.00 cm/s +---------------+-----------++  +--------------+-----------++ RVSP:          24.2 mmHg   MV A velocity:156.00 cm/s +---------------+-----------++ +--------------+-----------++ MV E/A ratio: 0.71        +--------------+-------+ +--------------+-----------++ SHUNTS                                              +--------------+-------+                               Systemic VTI: 0.33 m                                +--------------+-------+                               Systemic Diam:2.00 cm                               +--------------+-------+  Dorris Carnes MD Electronically signed by Dorris Carnes MD Signature Date/Time: 10/24/2018/1:35:21 PM    Final    VAS US CAROTID (at [facility] and [facility] only)  Result Date: 10/25/2018 Carotid Arterial Duplex Study Indications:  CVA. Risk Factors: Hypertension. Performing Technologist: Abram Sander RVS  Examination Guidelines: A complete evaluation includes B-mode imaging, spectral Doppler, color Doppler, and power Doppler as needed of all accessible portions of each vessel. Bilateral testing is considered an integral part of  a complete examination. Limited examinations for reoccurring indications may be performed as noted.  Right Carotid Findings: +----------+--------+--------+--------+------------+--------+           PSV cm/sEDV cm/sStenosisDescribe    Comments +----------+--------+--------+--------+------------+--------+ CCA Prox  110     19              heterogenous         +----------+--------+--------+--------+------------+--------+ CCA Distal103     19              heterogenous         +----------+--------+--------+--------+------------+--------+ ICA Prox  73      17      1-39%   heterogenous         +----------+--------+--------+--------+------------+--------+ ICA Distal106     24                                   +----------+--------+--------+--------+------------+--------+ ECA       94      6                                     +----------+--------+--------+--------+------------+--------+ +----------+--------+-------+--------+-------------------+           PSV cm/sEDV cmsDescribeArm Pressure (mmHG) +----------+--------+-------+--------+-------------------+ [id number]                                        +----------+--------+-------+--------+-------------------+ +---------+--------+--+--------+--+---------+ VertebralPSV cm/s37EDV cm/s10Antegrade +---------+--------+--+--------+--+---------+  Left Carotid Findings: +----------+--------+--------+--------+------------+--------+           PSV cm/sEDV cm/sStenosisDescribe    Comments +----------+--------+--------+--------+------------+--------+ CCA Prox  132     14              heterogenous         +----------+--------+--------+--------+------------+--------+ CCA Distal100     17              heterogenous         +----------+--------+--------+--------+------------+--------+ ICA Prox  107     25      1-39%   heterogenous         +----------+--------+--------+--------+------------+--------+ ICA Distal82      13                                   +----------+--------+--------+--------+------------+--------+ ECA       80                                           +----------+--------+--------+--------+------------+--------+ +----------+--------+--------+--------+-------------------+ SubclavianPSV cm/sEDV cm/sDescribeArm Pressure (mmHG) +----------+--------+--------+--------+-------------------+           138                                         +----------+--------+--------+--------+-------------------+ +---------+--------+--+--------+--+---------+ VertebralPSV cm/s67EDV cm/s17Antegrade +---------+--------+--+--------+--+---------+  Summary: Right Carotid: Velocities in the right ICA are consistent with a 1-39% stenosis. Left Carotid: Velocities in the left ICA are consistent with a 1-39% stenosis. Vertebrals:  Bilateral vertebral arteries demonstrate antegrade flow. *See table(s) above for measurements and observations.  Electronically signed by Mamie Nick  Sethi MD on 10/25/2018 at 12:23:35 PM.    Final     Assessment & Plan:   There are no diagnoses linked to this encounter.   Meds ordered this encounter  Medications  . meclizine (ANTIVERT) 12.5 MG tablet    Sig: Take 1 tablet (12.5 mg total) by mouth 2 (two) times daily as needed for dizziness.    Dispense:  60 tablet    Refill:  2    Replace first script that was sent. MD gave 2 additional refills     Follow-up: No follow-ups on file.  Walker Kehr, MD

## 2019-10-12 NOTE — Progress Notes (Addendum)
Subjective:   Cynthia Garza is a 86 y.o. female who presents for Medicare Annual (Subsequent) preventive examination.  Review of Systems:  No ROS. Medicare Wellness Visit. Additional risk factors are reflected in social history. Cardiac Risk Factors include: advanced age (>74mn, >>16women);dyslipidemia;hypertension  Sleep Patterns: Issue with falling asleep; feels rested on waking and sleeps 8 hours nightly. Home Safety/Smoke Alarms: Feels safe in home; uses home alarm. Smoke alarms in place. Living environment: 1-story home; Lives alone; no needs for DME; good support system. Seat Belt Safety/Bike Helmet: Wears seat belt.     Objective:     Vitals: BP 128/68 (BP Location: Left Arm, Patient Position: Sitting, Cuff Size: Normal)   Pulse 69   Temp 98.4 F (36.9 C)   Resp 16   Ht _0  (1.549 m)   Wt 128 lb (58.1 kg)   SpO2 98%   BMI 24.19 kg/m   Body mass index is 24.19 kg/m.  Advanced Directives 10/12/2019 10/24/2018 01/14/2017 07/17/2015 07/18/2014 06/30/2013 01/15/2012  Does Patient Have a Medical Advance Directive? Yes Yes No Yes Yes Patient has advance directive, copy not in chart Patient has advance directive, copy not in chart  Type of [place] - [place] - [place] -  Does patient want to make changes to medical advance directive? No - Patient declined No - Patient declined - - - No -  Copy of Healthcare Power of Attorney in Chart? - No - copy requested - - - Copy requested from family -  Pre-existing out of facility DNR order (yellow form or pink MOST form) - - - - - No -    Tobacco Social History   Tobacco Use  Smoking Status Never Smoker  Smokeless Tobacco Never Used     Counseling given: No   Clinical Intake:  Pre-visit preparation completed: Yes  Pain : No/denies pain Pain Score: 0-No pain     BMI - recorded: 24.9 Nutritional Status: BMI of 19-24  Normal Nutritional  Risks: Other (Comment) Diabetes: No  How often do you need to have someone help you when you read instructions, pamphlets, or other written materials from your doctor or pharmacy?: 1 - Never What is the last grade level you completed in school?: HSG  Interpreter Needed?: No  Information entered by :: SLisette Abu LPN  Past Medical History:  Diagnosis Date   Anxiety    Breast cancer [place]   ER+/PR+/Her2-   Depression    Dyslipidemia    GERD (gastroesophageal reflux disease)    Heart murmur    Hemangioma of intra-abdominal structures    HTN (hypertension)    Insomnia    LBP (low back pain)    Osteoporosis    Paresthesia    Radiation 08/08/13-08/29/13   Bilat.breast 42.72 Gy   Renal cyst    Shingles    Past Surgical History:  Procedure Laterality Date   ABDOMINAL HYSTERECTOMY     ?1970's   BREAST LUMPECTOMY WITH NEEDLE LOCALIZATION AND AXILLARY SENTINEL LYMPH NODE BX Bilateral 07/07/2013   Procedure: BREAST LUMPECTOMY WITH NEEDLE LOCALIZATION AND AXILLARY SENTINEL LYMPH NODE BIOPIES;  Surgeon: PMerrie Roof MD;  [facility]  Service: General;  Laterality: Bilateral;   Family History  Problem Relation Age of Onset   Kidney failure Mother    Hypertension Father    Esophageal cancer Father    Coronary artery disease Neg Hx    Social  History   Socioeconomic History   Marital status: Widowed    Spouse name: Not on file   Number of children: 2   Years of education: Not on file   Highest education level: Not on file  Occupational History   Occupation: Retired    Fish farm manager: RETIRED    Comment: [employer]  Tobacco Use   Smoking status: Never Smoker   Smokeless tobacco: Never Used  Substance and Sexual Activity   Alcohol use: No   Drug use: No   Sexual activity: Yes    Comment: menarche age 46, first live birth [age] yrs of age, P59, menopause 37, no HRT  Other Topics Concern   Not on file  Social History Narrative   Not on file   Social  Determinants of Health   Financial Resource Strain:    Difficulty of Paying Living Expenses:   Food Insecurity:    Worried About Charity fundraiser in the Last Year:    Arboriculturist in the Last Year:   Transportation Needs:    Film/video editor (Medical):    Lack of Transportation (Non-Medical):   Physical Activity:    Days of Exercise per Week:    Minutes of Exercise per Session:   Stress:    Feeling of Stress :   Social Connections:    Frequency of Communication with Friends and Family:    Frequency of Social Gatherings with Friends and Family:    Attends Religious Services:    Active Member of Clubs or Organizations:    Attends Archivist Meetings:    Marital Status:     Outpatient Encounter Medications as of 10/12/2019  Medication Sig   albuterol (PROVENTIL HFA) 108 (90 Base) MCG/ACT inhaler Inhale 2 puffs into the lungs every 4 (four) hours as needed for wheezing or shortness of breath.   amLODipine (NORVASC) 5 MG tablet TAKE 1 TABLET BY MOUTH EVERY DAY   anastrozole (ARIMIDEX) 1 MG tablet Take 1 mg by mouth daily.   BIOTIN PO Take by mouth.   buPROPion (WELLBUTRIN XL) 150 MG 24 hr tablet TAKE 1 TABLET BY MOUTH EVERY DAY   Cholecalciferol (VITAMIN D3) 50 MCG (2000 UT) capsule Take 1 capsule (2,000 Units total) by mouth daily.   clopidogrel (PLAVIX) 75 MG tablet Take 1 tablet (75 mg total) by mouth daily.   Coenzyme Q10 (CO Q-10 PO) Take by mouth.   ezetimibe (ZETIA) 10 MG tablet Take 1 tablet (10 mg total) by mouth daily.   Famotidine (PEPCID PO) Take by mouth.   Multiple Vitamins-Minerals (ALIVE ONCE DAILY WOMENS 50+) TABS Take 1 tablet by mouth daily.   Multiple Vitamins-Minerals (PRESERVISION AREDS 2 PO) Take 1 tablet by mouth 2 (two) times daily.   olmesartan-hydrochlorothiazide (BENICAR HCT) 40-25 MG tablet Take 1 tablet by mouth daily.   Propylene Glycol (SYSTANE BALANCE) 0.6 % SOLN Place 2 drops into both eyes at bedtime.   vitamin B-12  (CYANOCOBALAMIN) 1000 MCG tablet Take 1,000 mcg by mouth daily.   zolpidem (AMBIEN) 10 MG tablet Take 1 tablet (10 mg total) by mouth at bedtime as needed for sleep.   No facility-administered encounter medications on file as of 10/12/2019.    Activities of Daily Living In your present state of health, do you have any difficulty performing the following activities: 10/12/2019  Hearing? N  Vision? N  Difficulty concentrating or making decisions? N  Walking or climbing stairs? N  Dressing or bathing? N  Doing  errands, shopping? N  Preparing Food and eating ? N  Using the Toilet? N  In the past six months, have you accidently leaked urine? N  Comment wears a mini pad for for precautions  Do you have problems with loss of bowel control? N  Managing your Medications? N  Managing your Finances? N  Housekeeping or managing your Housekeeping? N  Some recent data might be hidden    Patient Care Team: Plotnikov, Evie Lacks, MD as PCP - General (Internal Medicine) Magrinat, Virgie Dad, MD as Consulting Physician (Oncology) Jovita Kussmaul, MD as Consulting Physician (General Surgery) Thea Silversmith, MD as Referring Physician (Radiation Oncology)    Assessment:   This is a routine wellness examination for [place].  Exercise Activities and Dietary recommendations Current Exercise Habits: Home exercise routine, Type of exercise: walking(walks around the house), Time (Minutes): 30, Frequency (Times/Week): 5, Weekly Exercise (Minutes/Week): 150, Intensity: Moderate, Exercise limited by: orthopedic condition(s)(right knee)  Goals      Eat more fruits and vegetables     Incorporate more fruits and vegetables in her diet         Fall Risk Fall Risk  10/12/2019 02/28/2019 11/23/2018 12/29/2017 12/02/2016  Falls in the past year? 0 0 0 No No  Number falls in past yr: 0 - - - -  Injury with Fall? 0 - - - -  Risk for fall due to : Orthopedic patient - - - -  Follow up Falls evaluation  completed;Education provided;Falls prevention discussed - - - -   Is the patient's home free of loose throw rugs in walkways, pet beds, electrical cords, etc?   yes      Grab bars in the bathroom? yes      Handrails on the stairs?   yes      Adequate lighting?   yes   Depression Screen PHQ 2/9 Scores 10/12/2019 12/29/2017 12/02/2016 09/01/2016  PHQ - 2 Score 0 0 0 -  PHQ- 9 Score - - 3 -  Exception Documentation - - - Medical reason     Cognitive Function        Immunization History  Administered Date(s) Administered   Fluad Quad(high Dose 65+) 04/13/2019   Influenza Split 03/26/2012, 04/11/2015   Influenza Whole 03/07/2008, 04/26/2009, 05/13/2010   Influenza, High Dose Seasonal PF 07/21/2016, 03/13/2017, 04/07/2018   Influenza,inj,Quad PF,6+ Mos 02/14/2013, 01/17/2014   Influenza-Unspecified 02/24/2015   Pneumococcal Conjugate-13 05/16/2013   Pneumococcal Polysaccharide-23 02/10/2007, 10/23/2015   Td 12/13/1998   Tdap 12/18/2015   Zoster 07/11/2008    Qualifies for Shingles Vaccine? Yes  Screening Tests Health Maintenance  Topic Date Due   COVID-19 Vaccine (1) Never done   INFLUENZA VACCINE  12/25/2019   TETANUS/TDAP  12/17/2025   DEXA SCAN  Completed   PNA vac Low Risk Adult  Completed    Cancer Screenings: Lung: Low Dose CT Chest recommended if Age 22-80 years, 30 pack-year currently smoking OR have quit w/in 15years. Patient does not qualify. Breast:  Up to date on Mammogram? Yes   Up to date of Bone Density/Dexa? Yes Colorectal: not recommended due to age     Plan:     Reviewed health maintenance screenings with patient today and relevant education, vaccines, and/or referrals were provided.    Continue doing brain stimulating activities (puzzles, reading, adult coloring books, staying active) to keep memory sharp.    Continue to eat heart healthy diet (full of fruits, vegetables, whole grains, lean protein, water--limit salt, fat,  and sugar intake) and  increase physical activity as tolerated.   I have personally reviewed and noted the following in the patient's chart:   Medical and social history Use of alcohol, tobacco or illicit drugs  Current medications and supplements Functional ability and status Nutritional status Physical activity Advanced directives List of other physicians Hospitalizations, surgeries, and ER visits in previous 12 months Vitals Screenings to include cognitive, depression, and falls Referrals and appointments  In addition, I have reviewed and discussed with patient certain preventive protocols, quality metrics, and best practice recommendations. A written personalized care plan for preventive services as well as general preventive health recommendations were provided to patient.     Sheral Flow, LPN  [id number] Nurse Health Advisor   Medical screening examination/treatment/procedure(s) were performed by non-physician practitioner and as supervising physician I was immediately available for consultation/collaboration.  I agree with above. Lew Dawes, MD

## 2019-10-15 IMAGING — DX DG CHEST 2V
2 series · 2 of 2 positions shown · non-contrast
Comparison: 06/30/2013

CLINICAL DATA: Nonproductive cough for several days. Shortness of
breath and chest wall pain. Recent positive influenza test.

EXAM:
CHEST  2 VIEW

[chest pa]
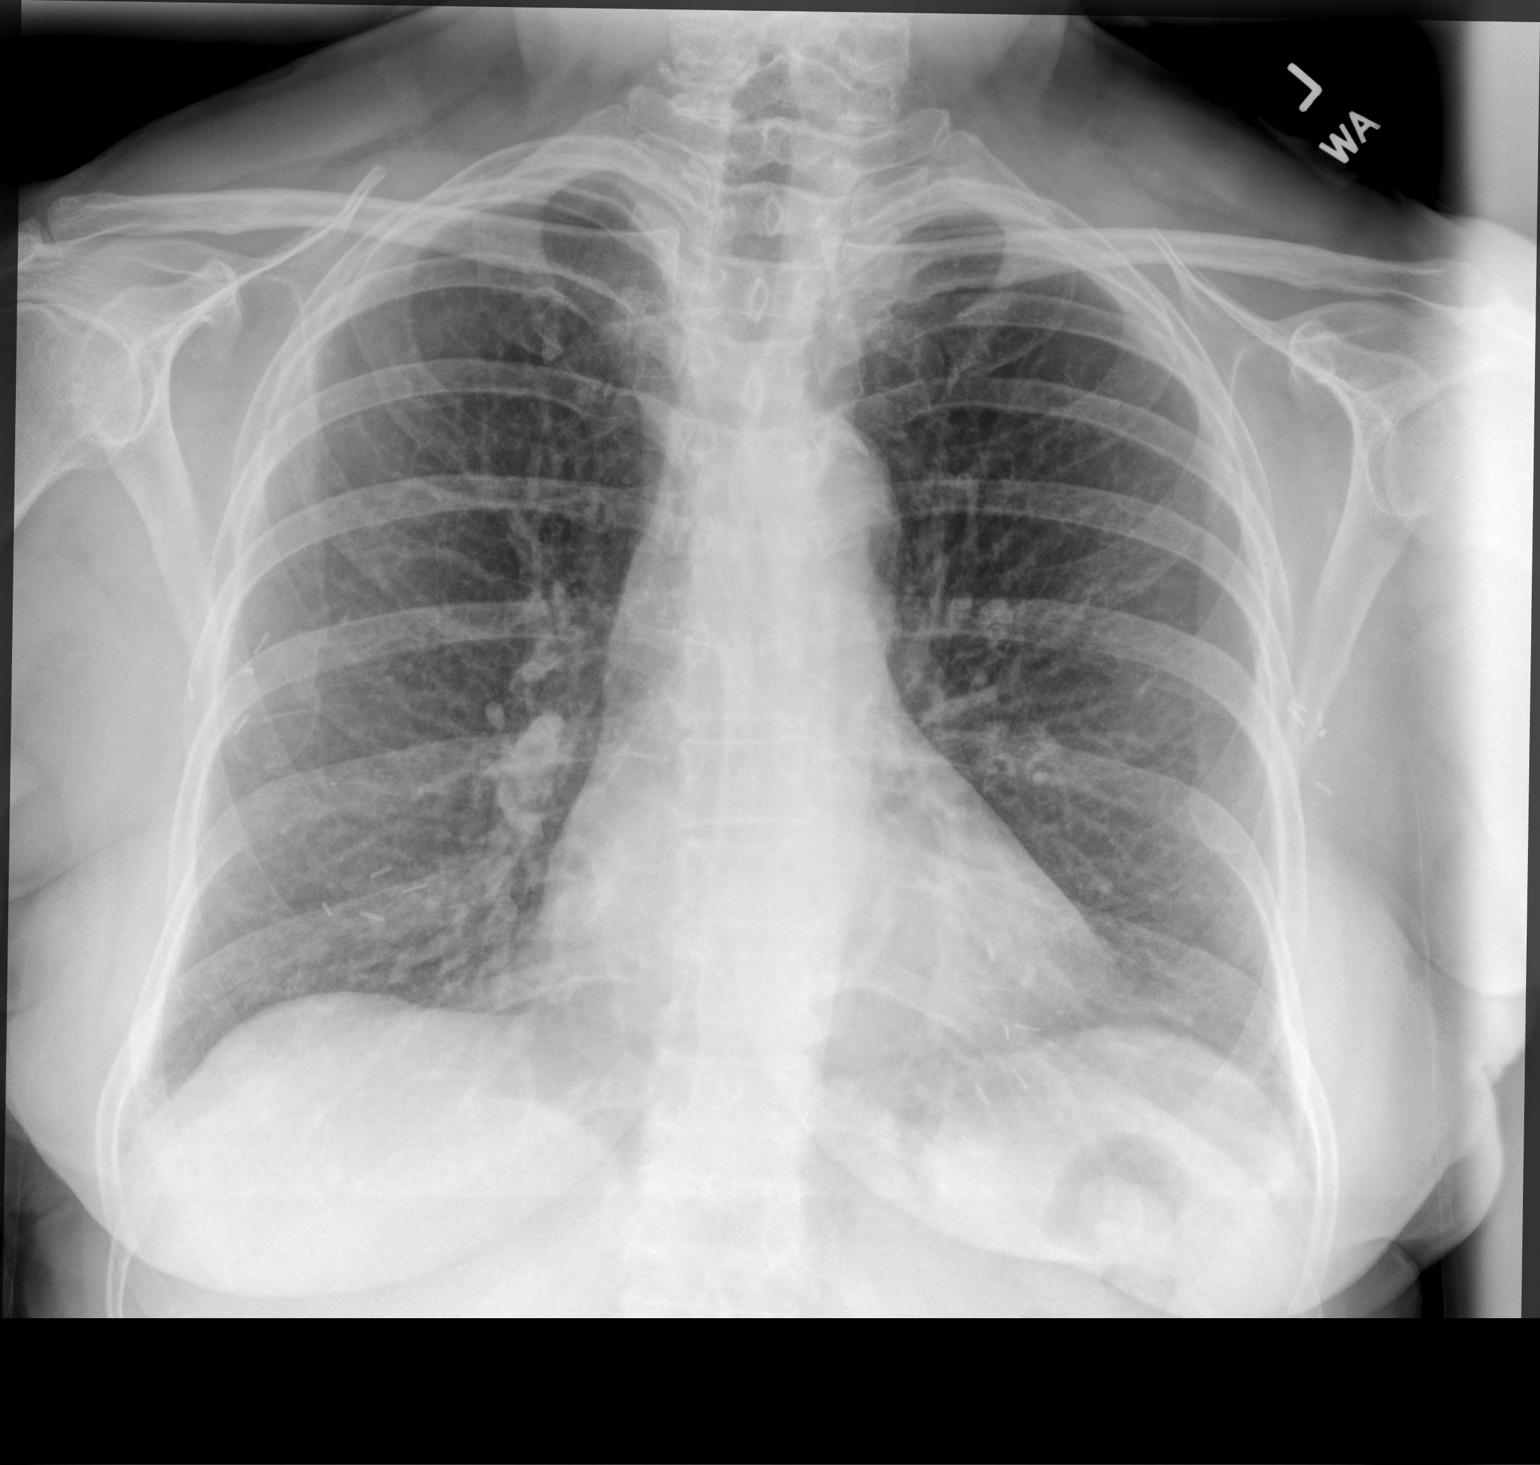

[chest lat]
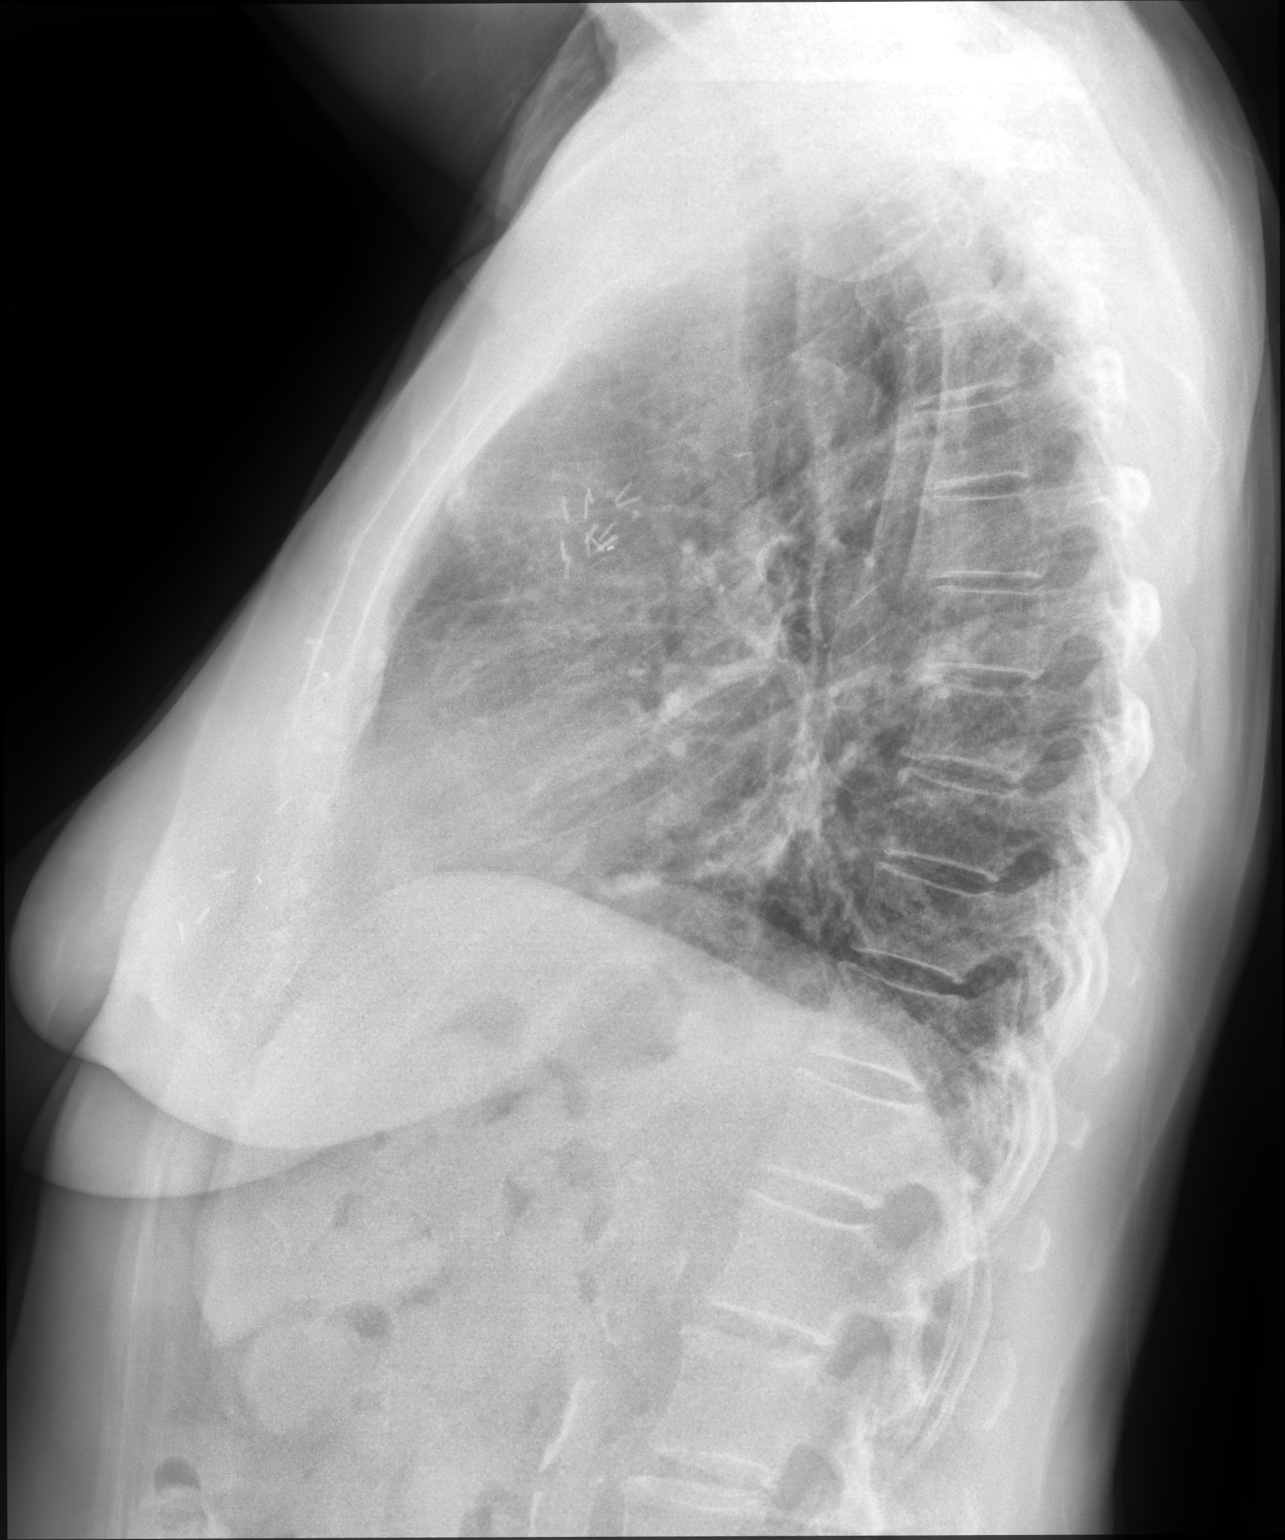

[2 of 2 positions shown; findings below may reference images not displayed]

FINDINGS: The cardiac silhouette is upper limits of normal in size. The
interstitial markings are increased diffusely throughout both lungs.
There is mild streaky retrocardiac opacity in the left lower lobe.
No pleural effusion or pneumothorax is identified. Surgical clips
are noted in the breast and axillae. Aortic atherosclerosis is
noted.
IMPRESSION: 1. Diffusely increased interstitial markings in both lungs which may
reflect viral/atypical infection.
2. Mild streaky left lower lobe opacity, possibly atelectasis
although early bronchopneumonia is not excluded.

## 2019-12-10 ENCOUNTER — Other Ambulatory Visit: Payer: Self-pay | Admitting: Internal Medicine

## 2019-12-24 ENCOUNTER — Other Ambulatory Visit: Payer: Self-pay | Admitting: Internal Medicine

## 2020-01-16 ENCOUNTER — Ambulatory Visit (INDEPENDENT_AMBULATORY_CARE_PROVIDER_SITE_OTHER): Payer: Medicare Other | Admitting: Internal Medicine

## 2020-01-16 ENCOUNTER — Encounter: Payer: Self-pay | Admitting: Internal Medicine

## 2020-01-16 ENCOUNTER — Other Ambulatory Visit: Payer: Self-pay

## 2020-01-16 VITALS — BP 128/58 | HR 71 | Temp 98.1°F | Wt 129.4 lb

## 2020-01-16 DIAGNOSIS — E785 Hyperlipidemia, unspecified: Secondary | ICD-10-CM | POA: Diagnosis not present

## 2020-01-16 DIAGNOSIS — C50411 Malignant neoplasm of upper-outer quadrant of right female breast: Secondary | ICD-10-CM

## 2020-01-16 DIAGNOSIS — E559 Vitamin D deficiency, unspecified: Secondary | ICD-10-CM

## 2020-01-16 DIAGNOSIS — F329 Major depressive disorder, single episode, unspecified: Secondary | ICD-10-CM | POA: Diagnosis not present

## 2020-01-16 DIAGNOSIS — I63232 Cerebral infarction due to unspecified occlusion or stenosis of left carotid arteries: Secondary | ICD-10-CM

## 2020-01-16 DIAGNOSIS — Z170 Estrogen receptor positive status [ER+]: Secondary | ICD-10-CM | POA: Diagnosis not present

## 2020-01-16 DIAGNOSIS — I10 Essential (primary) hypertension: Secondary | ICD-10-CM | POA: Diagnosis not present

## 2020-01-16 DIAGNOSIS — F419 Anxiety disorder, unspecified: Secondary | ICD-10-CM | POA: Diagnosis not present

## 2020-01-16 DIAGNOSIS — F32A Depression, unspecified: Secondary | ICD-10-CM

## 2020-01-16 NOTE — Assessment & Plan Note (Signed)
DVT proph w/ASA for flying

## 2020-01-16 NOTE — Assessment & Plan Note (Signed)
Lorazepam prn  Potential benefits of a long term benzodiazepines  use as well as potential risks  and complications were explained to the patient and were aknowledged.  

## 2020-01-16 NOTE — Progress Notes (Signed)
Subjective:  Patient ID: Cynthia Garza, female    DOB: 30-Dec-1932  Age: 86 y.o. MRN: [id number]  CC: Follow-up (6 weeks) and Advice Only (re: travel plans)   HPI [name] presents for depression, HTN, GERD f/u  Outpatient Medications Prior to Visit  Medication Sig Dispense Refill  . amLODipine (NORVASC) 5 MG tablet TAKE 1 TABLET BY MOUTH EVERY DAY 90 tablet 3  . BIOTIN PO Take by mouth.    Marland Kitchen buPROPion (WELLBUTRIN XL) 150 MG 24 hr tablet TAKE 1 TABLET BY MOUTH EVERY DAY 90 tablet 3  . Cholecalciferol (VITAMIN D3) 50 MCG (2000 UT) capsule Take 1 capsule (2,000 Units total) by mouth daily. 100 capsule 3  . clopidogrel (PLAVIX) 75 MG tablet Take 1 tablet (75 mg total) by mouth daily. 90 tablet 1  . Coenzyme Q10 (CO Q-10 PO) Take by mouth.    . ezetimibe (ZETIA) 10 MG tablet Take 1 tablet (10 mg total) by mouth daily. 90 tablet 3  . Famotidine (PEPCID PO) Take by mouth.    . meclizine (ANTIVERT) 12.5 MG tablet Take 1 tablet (12.5 mg total) by mouth 2 (two) times daily as needed for dizziness. 60 tablet 2  . methylPREDNISolone (MEDROL DOSEPAK) 4 MG TBPK tablet As directed 21 tablet 0  . Multiple Vitamins-Minerals (ALIVE ONCE DAILY WOMENS 50+) TABS Take 1 tablet by mouth daily.    . Multiple Vitamins-Minerals (PRESERVISION AREDS 2 PO) Take 1 tablet by mouth 2 (two) times daily.    Marland Kitchen olmesartan-hydrochlorothiazide (BENICAR HCT) 40-25 MG tablet TAKE 1 TABLET BY MOUTH EVERY DAY 90 tablet 3  . Propylene Glycol (SYSTANE BALANCE) 0.6 % SOLN Place 2 drops into both eyes at bedtime.    . vitamin B-12 (CYANOCOBALAMIN) 1000 MCG tablet Take 1,000 mcg by mouth daily.    Marland Kitchen zolpidem (AMBIEN) 10 MG tablet Take 1 tablet (10 mg total) by mouth at bedtime as needed for sleep. 90 tablet 1  . albuterol (PROVENTIL HFA) 108 (90 Base) MCG/ACT inhaler Inhale 2 puffs into the lungs every 4 (four) hours as needed for wheezing or shortness of breath. (Patient not taking: Reported on 01/16/2020) 8.5 g 3  .  anastrozole (ARIMIDEX) 1 MG tablet Take 1 mg by mouth daily. (Patient not taking: Reported on 01/16/2020)     No facility-administered medications prior to visit.    ROS: Review of Systems  Constitutional: Negative for activity change, appetite change, chills, fatigue and unexpected weight change.  HENT: Negative for congestion, mouth sores and sinus pressure.   Eyes: Negative for visual disturbance.  Respiratory: Negative for cough and chest tightness.   Gastrointestinal: Negative for abdominal pain and nausea.  Genitourinary: Negative for difficulty urinating, frequency and vaginal pain.  Musculoskeletal: Negative for back pain and gait problem.  Skin: Negative for pallor and rash.  Neurological: Negative for dizziness, tremors, weakness, numbness and headaches.  Psychiatric/Behavioral: Negative for confusion, sleep disturbance and suicidal ideas.    Objective:  BP (!) 128/58 (BP Location: Right Arm, Patient Position: Sitting, Cuff Size: Normal)   Pulse 71   Temp 98.1 F (36.7 C) (Oral)   Wt 129 lb 6.4 oz (58.7 kg)   SpO2 98%   BMI 24.45 kg/m   BP Readings from Last 3 Encounters:  01/16/20 (!) 128/58  10/12/19 128/68  10/12/19 128/68    Wt Readings from Last 3 Encounters:  01/16/20 129 lb 6.4 oz (58.7 kg)  10/12/19 128 lb (58.1 kg)  10/12/19 128 lb (58.1 kg)  Physical Exam Constitutional:      General: She is not in acute distress.    Appearance: She is well-developed.  HENT:     Head: Normocephalic.     Right Ear: External ear normal.     Left Ear: External ear normal.     Nose: Nose normal.  Eyes:     General:        Right eye: No discharge.        Left eye: No discharge.     Conjunctiva/sclera: Conjunctivae normal.     Pupils: Pupils are equal, round, and reactive to light.  Neck:     Thyroid: No thyromegaly.     Vascular: No JVD.     Trachea: No tracheal deviation.  Cardiovascular:     Rate and Rhythm: Normal rate and regular rhythm.     Heart  sounds: Normal heart sounds.  Pulmonary:     Effort: No respiratory distress.     Breath sounds: No stridor. No wheezing.  Abdominal:     General: Bowel sounds are normal. There is no distension.     Palpations: Abdomen is soft. There is no mass.     Tenderness: There is no abdominal tenderness. There is no guarding or rebound.  Musculoskeletal:        General: No tenderness.     Cervical back: Normal range of motion and neck supple.  Lymphadenopathy:     Cervical: No cervical adenopathy.  Skin:    Findings: No erythema or rash.  Neurological:     Mental Status: She is oriented to person, place, and time.     Cranial Nerves: No cranial nerve deficit.     Motor: No abnormal muscle tone.     Coordination: Coordination normal.     Deep Tendon Reflexes: Reflexes normal.  Psychiatric:        Behavior: Behavior normal.        Thought Content: Thought content normal.        Judgment: Judgment normal.     Lab Results  Component Value Date   WBC 7.9 10/24/2018   HGB 12.9 10/24/2018   HCT 38.0 10/24/2018   PLT 206 10/24/2018   GLUCOSE 97 04/13/2019   CHOL 188 04/13/2019   TRIG 241.0 (H) 04/13/2019   HDL 53.20 04/13/2019   LDLDIRECT 96.0 04/13/2019   LDLCALC 129 (H) 02/28/2019   ALT 9 04/13/2019   AST 16 04/13/2019   NA 141 04/13/2019   K 3.9 04/13/2019   CL 102 04/13/2019   CREATININE 0.92 04/13/2019   BUN 23 04/13/2019   CO2 27 04/13/2019   TSH 1.97 12/18/2015   INR 1.0 10/24/2018   HGBA1C 5.6 10/24/2018    CT Head Wo Contrast  Result Date: 10/24/2018 CLINICAL DATA:  Right upper extremity heaviness and lower extremity numbness EXAM: CT HEAD WITHOUT CONTRAST TECHNIQUE: Contiguous axial images were obtained from the base of the skull through the vertex without intravenous contrast. COMPARISON:  None. FINDINGS: Brain: No acute territorial infarction, hemorrhage, or intracranial mass. The ventricles are nonenlarged. Vascular: No hyperdense vessels.  Carotid vascular  calcification. Skull: Normal. Negative for fracture or focal lesion. Sinuses/Orbits: No acute finding. Other: Chronic appearing abnormality at the anterior arch of C1, incompletely visualized. Prominent degenerative changes at the C1-C2 articulation. IMPRESSION: Negative non contrasted CT appearance of the brain.  Mild atrophy Electronically Signed   By: Donavan Foil M.D.   On: 10/24/2018 01:00   MR BRAIN WO CONTRAST  Result Date: 10/24/2018  CLINICAL DATA:  Initial evaluation for new onset right-sided arm and leg weakness. EXAM: MRI HEAD WITHOUT CONTRAST MRA HEAD WITHOUT CONTRAST TECHNIQUE: Multiplanar, multiecho pulse sequences of the brain and surrounding structures were obtained without intravenous contrast. Angiographic images of the head were obtained using MRA technique without contrast. COMPARISON:  Prior CT from earlier the same day. FINDINGS: MRI HEAD FINDINGS Brain: Cerebral volume within normal limits for age. Minimal T2/FLAIR hyperintensity noted within the periventricular white matter, nonspecific, and felt to be within normal limits for age. 7 mm focus of diffusion abnormality seen at the mid-posterior left centrum semi ovale (series 5, image 80). No associated hemorrhage. No other evidence for acute or subacute ischemia. Gray-white matter differentiation otherwise maintained. No other areas of remote cortical infarction. No foci of susceptibility artifact to suggest acute or chronic intracranial hemorrhage. No mass lesion, midline shift or mass effect. No hydrocephalus. No extra-axial fluid collection. Pituitary gland suprasellar region normal. Midline structures intact. Vascular: Major intracranial vascular flow voids are maintained. Skull and upper cervical spine: Craniocervical junction normal. Bone marrow signal intensity within normal limits. Hyperostosis frontalis interna noted. Scalp soft tissues unremarkable. Sinuses/Orbits: Patient status post bilateral ocular lens replacement. Globes  orbital soft tissues demonstrate no acute finding. Paranasal sinuses are clear. No mastoid effusion. Inner ear structures normal. Other: None. MRA HEAD FINDINGS ANTERIOR CIRCULATION: Distal cervical segments of the internal carotid arteries are patent with symmetric antegrade flow. Petrous segments widely patent bilaterally. Scattered atheromatous irregularity within the cavernous/supraclinoid ICAs. No significant stenosis seen on the right. There is a short-segment severe stenosis at the anterior genu of the cavernous left ICA (series 9, image 104). ICA termini well perfused. A1 segments patent bilaterally. Normal anterior communicating artery. Anterior cerebral arteries widely patent to their distal aspects. M1 segments widely patent bilaterally. Normal MCA bifurcations. Distal MCA branches well perfused and symmetric. Mild distal small vessel atheromatous irregularity. POSTERIOR CIRCULATION: Vertebral arteries patent to the vertebrobasilar junction without stenosis. Left vertebral artery dominant. Posterior inferior cerebral arteries patent bilaterally. Basilar patent to its distal aspect without stenosis. Superior cerebral arteries patent bilaterally. Both of the posterior cerebral arteries primarily supplied via the basilar and are well perfused to their distal aspects. No intracranial aneurysm. IMPRESSION: MRI HEAD IMPRESSION: 1. 7 mm acute ischemic nonhemorrhagic infarct involving the deep white matter of the mid-posterior left centrum semi ovale. 2. Otherwise normal brain MRI for age. MRA HEAD IMPRESSION: 1. Negative intracranial MRA for large vessel occlusion. 2. Short-segment severe cavernous left ICA stenosis. 3. Otherwise negative intracranial MRA. No other hemodynamically significant or correctable stenosis. Electronically Signed   By: Jeannine Boga M.D.   On: 10/24/2018 06:59   DG Chest Portable 1 View  Result Date: 10/24/2018 CLINICAL DATA:  TIA EXAM: PORTABLE CHEST 1 VIEW COMPARISON:   08/14/2017 FINDINGS: The heart size and mediastinal contours are within normal limits. Both lungs are clear. Surgical clips over the axilla bilaterally. Aortic atherosclerosis. IMPRESSION: No active disease. Electronically Signed   By: Donavan Foil M.D.   On: 10/24/2018 00:43   MR MRA HEAD WO CONTRAST  Result Date: 10/24/2018 CLINICAL DATA:  Initial evaluation for new onset right-sided arm and leg weakness. EXAM: MRI HEAD WITHOUT CONTRAST MRA HEAD WITHOUT CONTRAST TECHNIQUE: Multiplanar, multiecho pulse sequences of the brain and surrounding structures were obtained without intravenous contrast. Angiographic images of the head were obtained using MRA technique without contrast. COMPARISON:  Prior CT from earlier the same day. FINDINGS: MRI HEAD FINDINGS Brain: Cerebral volume within normal limits  for age. Minimal T2/FLAIR hyperintensity noted within the periventricular white matter, nonspecific, and felt to be within normal limits for age. 7 mm focus of diffusion abnormality seen at the mid-posterior left centrum semi ovale (series 5, image 80). No associated hemorrhage. No other evidence for acute or subacute ischemia. Gray-white matter differentiation otherwise maintained. No other areas of remote cortical infarction. No foci of susceptibility artifact to suggest acute or chronic intracranial hemorrhage. No mass lesion, midline shift or mass effect. No hydrocephalus. No extra-axial fluid collection. Pituitary gland suprasellar region normal. Midline structures intact. Vascular: Major intracranial vascular flow voids are maintained. Skull and upper cervical spine: Craniocervical junction normal. Bone marrow signal intensity within normal limits. Hyperostosis frontalis interna noted. Scalp soft tissues unremarkable. Sinuses/Orbits: Patient status post bilateral ocular lens replacement. Globes orbital soft tissues demonstrate no acute finding. Paranasal sinuses are clear. No mastoid effusion. Inner ear  structures normal. Other: None. MRA HEAD FINDINGS ANTERIOR CIRCULATION: Distal cervical segments of the internal carotid arteries are patent with symmetric antegrade flow. Petrous segments widely patent bilaterally. Scattered atheromatous irregularity within the cavernous/supraclinoid ICAs. No significant stenosis seen on the right. There is a short-segment severe stenosis at the anterior genu of the cavernous left ICA (series 9, image 104). ICA termini well perfused. A1 segments patent bilaterally. Normal anterior communicating artery. Anterior cerebral arteries widely patent to their distal aspects. M1 segments widely patent bilaterally. Normal MCA bifurcations. Distal MCA branches well perfused and symmetric. Mild distal small vessel atheromatous irregularity. POSTERIOR CIRCULATION: Vertebral arteries patent to the vertebrobasilar junction without stenosis. Left vertebral artery dominant. Posterior inferior cerebral arteries patent bilaterally. Basilar patent to its distal aspect without stenosis. Superior cerebral arteries patent bilaterally. Both of the posterior cerebral arteries primarily supplied via the basilar and are well perfused to their distal aspects. No intracranial aneurysm. IMPRESSION: MRI HEAD IMPRESSION: 1. 7 mm acute ischemic nonhemorrhagic infarct involving the deep white matter of the mid-posterior left centrum semi ovale. 2. Otherwise normal brain MRI for age. MRA HEAD IMPRESSION: 1. Negative intracranial MRA for large vessel occlusion. 2. Short-segment severe cavernous left ICA stenosis. 3. Otherwise negative intracranial MRA. No other hemodynamically significant or correctable stenosis. Electronically Signed   By: Jeannine Boga M.D.   On: 10/24/2018 06:59   ECHOCARDIOGRAM COMPLETE  Result Date: 10/24/2018   ECHOCARDIOGRAM REPORT   Patient Name:   TARAH BUBOLTZ Date of Exam: 10/24/2018 Medical Rec #:  [id number]           Height:       61.0 in Accession #:    [id number]           Weight:       135.0 lb Date of Birth:  1932/12/29            BSA:          1.60 m Patient Age:    33 years            BP:           118/57 mmHg Patient Gender: F                   HR:           75 bpm. Exam Location:  Inpatient  Procedure: 2D Echo Indications:    Stroke 434.91  History:        Patient has no prior history of Echocardiogram examinations.  Signs/Symptoms: Murmur Risk Factors: Hypertension and                 Dyslipidemia.  Sonographer:    Mikki Santee RDCS (AE) Referring Phys: [place]  1. The left ventricle has hyperdynamic systolic function, with an ejection fraction of >65%. The cavity size was normal. Left ventricular diastolic Doppler parameters are consistent with impaired relaxation.  2. The right ventricle has normal systolic function. The cavity was normal. There is no increase in right ventricular wall thickness.  3. Left atrial size was mildly dilated.  4. The mitral valve is abnormal. Mild thickening of the mitral valve leaflet. There is moderate mitral annular calcification present.  5. The aortic valve is tricuspid. Mild thickening of the aortic valve. Moderate calcification of the aortic valve.  6. The interatrial septum was not assessed. FINDINGS  Left Ventricle: The left ventricle has hyperdynamic systolic function, with an ejection fraction of >65%. The cavity size was normal. The left ventricular wall thickness was not assessed. Left ventricular diastolic Doppler parameters are consistent with  impaired relaxation. Right Ventricle: The right ventricle has normal systolic function. The cavity was normal. There is no increase in right ventricular wall thickness. Left Atrium: Left atrial size was mildly dilated. Right Atrium: Right atrial size was normal in size. Right atrial pressure is estimated at 10 mmHg. Interatrial Septum: The interatrial septum was not assessed. Pericardium: There is no evidence of pericardial effusion. Mitral Valve:  The mitral valve is abnormal. Mild thickening of the mitral valve leaflet. There is moderate mitral annular calcification present. Mitral valve regurgitation is trivial by color flow Doppler. Tricuspid Valve: The tricuspid valve is normal in structure. Tricuspid valve regurgitation is trivial by color flow Doppler. Aortic Valve: The aortic valve is tricuspid Mild thickening of the aortic valve. Moderate calcification of the aortic valve. Aortic valve regurgitation was not visualized by color flow Doppler. Pulmonic Valve: The pulmonic valve was normal in structure. Pulmonic valve regurgitation is not visualized by color flow Doppler. Venous: The inferior vena cava is normal in size with greater than 50% respiratory variability.  +--------------+--------++ LEFT VENTRICLE         +----------------+---------++ +--------------+--------++ Diastology                PLAX 2D                +----------------+---------++ +--------------+--------++ LV e' lateral:  5.98 cm/s LVIDd:        3.60 cm  +----------------+---------++ +--------------+--------++ LV E/e' lateral:18.6      LVIDs:        2.40 cm  +----------------+---------++ +--------------+--------++ LV e' medial:   4.90 cm/s LV PW:        1.00 cm  +----------------+---------++ +--------------+--------++ LV E/e' medial: 22.7      LV IVS:       1.10 cm  +----------------+---------++ +--------------+--------++ LVOT diam:    2.00 cm  +--------------+--------++ LV SV:        34 ml    +--------------+--------++ LV SV Index:  20.93    +--------------+--------++ LVOT Area:    3.14 cm +--------------+--------++                        +--------------+--------++ +---------------+----------++ RIGHT VENTRICLE           +---------------+----------++ RV S prime:    12.10 cm/s +---------------+----------++ TAPSE (M-mode):2.2 cm     +---------------+----------++ RVSP:  24.2 mmHg   +---------------+----------++ +---------------+-------++-----------++ LEFT ATRIUM           Index       +---------------+-------++-----------++ LA diam:       2.80 cm1.75 cm/m  +---------------+-------++-----------++ LA Vol (A2C):  51.5 ml32.22 ml/m +---------------+-------++-----------++ LA Vol (A4C):  56.7 ml35.48 ml/m +---------------+-------++-----------++ LA Biplane Vol:57.8 ml36.17 ml/m +---------------+-------++-----------++ +------------+---------++-----------++ RIGHT ATRIUM         Index       +------------+---------++-----------++ RA Pressure:3.00 mmHg            +------------+---------++-----------++ RA Area:    11.30 cm            +------------+---------++-----------++ RA Volume:  24.70 ml 15.45 ml/m +------------+---------++-----------++  +------------+-----------++ AORTIC VALVE            +------------+-----------++ LVOT Vmax:  122.00 cm/s +------------+-----------++ LVOT Vmean: 86.500 cm/s +------------+-----------++ LVOT VTI:   0.330 m     +------------+-----------++  +-------------+-------++ AORTA                +-------------+-------++ Ao Root diam:3.00 cm +-------------+-------++ +--------------+-----------++ +---------------+-----------++ MITRAL VALVE              TRICUSPID VALVE            +--------------+-----------++ +---------------+-----------++ MV Area (PHT):2.08 cm    TR Peak grad:  21.2 mmHg   +--------------+-----------++ +---------------+-----------++ MV PHT:       106.00 msec TR Vmax:       230.00 cm/s +--------------+-----------++ +---------------+-----------++ +--------------+-----------++ Estimated RAP: 3.00 mmHg   MV E velocity:111.00 cm/s +---------------+-----------++ +--------------+-----------++ RVSP:          24.2 mmHg   MV A velocity:156.00 cm/s +---------------+-----------++ +--------------+-----------++ MV E/A ratio: 0.71         +--------------+-------+ +--------------+-----------++ SHUNTS                                              +--------------+-------+                               Systemic VTI: 0.33 m                                +--------------+-------+                               Systemic Diam:2.00 cm                               +--------------+-------+  Dorris Carnes MD Electronically signed by Dorris Carnes MD Signature Date/Time: 10/24/2018/1:35:21 PM    Final    VAS US CAROTID (at [facility] and [facility] only)  Result Date: 10/25/2018 Carotid Arterial Duplex Study Indications:  CVA. Risk Factors: Hypertension. Performing Technologist: Abram Sander RVS  Examination Guidelines: A complete evaluation includes B-mode imaging, spectral Doppler, color Doppler, and power Doppler as needed of all accessible portions of each vessel. Bilateral testing is considered an integral part of a complete examination. Limited examinations for reoccurring indications may be performed as noted.  Right Carotid Findings: +----------+--------+--------+--------+------------+--------+           PSV cm/sEDV cm/sStenosisDescribe    Comments +----------+--------+--------+--------+------------+--------+ CCA Prox  110  19              heterogenous         +----------+--------+--------+--------+------------+--------+ CCA Distal103     19              heterogenous         +----------+--------+--------+--------+------------+--------+ ICA Prox  73      17      1-39%   heterogenous         +----------+--------+--------+--------+------------+--------+ ICA Distal106     24                                   +----------+--------+--------+--------+------------+--------+ ECA       94      6                                    +----------+--------+--------+--------+------------+--------+ +----------+--------+-------+--------+-------------------+           PSV cm/sEDV cmsDescribeArm Pressure (mmHG)  +----------+--------+-------+--------+-------------------+ [id number]                                        +----------+--------+-------+--------+-------------------+ +---------+--------+--+--------+--+---------+ VertebralPSV cm/s37EDV cm/s10Antegrade +---------+--------+--+--------+--+---------+  Left Carotid Findings: +----------+--------+--------+--------+------------+--------+           PSV cm/sEDV cm/sStenosisDescribe    Comments +----------+--------+--------+--------+------------+--------+ CCA Prox  132     14              heterogenous         +----------+--------+--------+--------+------------+--------+ CCA Distal100     17              heterogenous         +----------+--------+--------+--------+------------+--------+ ICA Prox  107     25      1-39%   heterogenous         +----------+--------+--------+--------+------------+--------+ ICA Distal82      13                                   +----------+--------+--------+--------+------------+--------+ ECA       80                                           +----------+--------+--------+--------+------------+--------+ +----------+--------+--------+--------+-------------------+ SubclavianPSV cm/sEDV cm/sDescribeArm Pressure (mmHG) +----------+--------+--------+--------+-------------------+           138                                         +----------+--------+--------+--------+-------------------+ +---------+--------+--+--------+--+---------+ VertebralPSV cm/s67EDV cm/s17Antegrade +---------+--------+--+--------+--+---------+  Summary: Right Carotid: Velocities in the right ICA are consistent with a 1-39% stenosis. Left Carotid: Velocities in the left ICA are consistent with a 1-39% stenosis. Vertebrals: Bilateral vertebral arteries demonstrate antegrade flow. *See table(s) above for measurements and observations.  Electronically signed by Antony Contras MD on 10/25/2018 at 12:23:35 PM.     Final     Assessment & Plan:    Walker Kehr, MD

## 2020-01-16 NOTE — Assessment & Plan Note (Signed)
BP Readings from Last 3 Encounters:  01/16/20 (!) 128/58  10/12/19 128/68  10/12/19 128/68

## 2020-01-16 NOTE — Assessment & Plan Note (Signed)
Doing well Cont Wellbutrin

## 2020-02-22 ENCOUNTER — Other Ambulatory Visit: Payer: Self-pay | Admitting: Neurology

## 2020-02-22 ENCOUNTER — Other Ambulatory Visit: Payer: Self-pay | Admitting: Oncology

## 2020-04-13 ENCOUNTER — Other Ambulatory Visit: Payer: Self-pay

## 2020-04-13 ENCOUNTER — Other Ambulatory Visit (INDEPENDENT_AMBULATORY_CARE_PROVIDER_SITE_OTHER): Payer: Medicare Other

## 2020-04-13 DIAGNOSIS — E559 Vitamin D deficiency, unspecified: Secondary | ICD-10-CM | POA: Diagnosis not present

## 2020-04-13 DIAGNOSIS — Z1231 Encounter for screening mammogram for malignant neoplasm of breast: Secondary | ICD-10-CM | POA: Diagnosis not present

## 2020-04-13 DIAGNOSIS — E785 Hyperlipidemia, unspecified: Secondary | ICD-10-CM

## 2020-04-13 DIAGNOSIS — Z170 Estrogen receptor positive status [ER+]: Secondary | ICD-10-CM

## 2020-04-13 DIAGNOSIS — C50411 Malignant neoplasm of upper-outer quadrant of right female breast: Secondary | ICD-10-CM

## 2020-04-13 LAB — URINALYSIS, ROUTINE W REFLEX MICROSCOPIC
Bilirubin Urine: NEGATIVE
Hgb urine dipstick: NEGATIVE
Ketones, ur: NEGATIVE
Nitrite: NEGATIVE
RBC / HPF: NONE SEEN (ref 0–?)
Specific Gravity, Urine: 1.015 (ref 1.000–1.030)
Total Protein, Urine: NEGATIVE
Urine Glucose: NEGATIVE
Urobilinogen, UA: 0.2 (ref 0.0–1.0)
pH: 6 (ref 5.0–8.0)

## 2020-04-13 LAB — LIPID PANEL
Cholesterol: 207 mg/dL — ABNORMAL HIGH (ref 0–200)
HDL: 60.1 mg/dL (ref >39.00)
LDL Cholesterol: 117 mg/dL — ABNORMAL HIGH (ref 0–99)
NonHDL: 146.89
Total CHOL/HDL Ratio: 3
Triglycerides: 148 mg/dL (ref 0.0–149.0)
VLDL: 29.6 mg/dL (ref 0.0–40.0)

## 2020-04-13 LAB — TSH: TSH: 2.36 u[IU]/mL (ref 0.35–4.50)

## 2020-04-13 LAB — CBC WITH DIFFERENTIAL/PLATELET
Basophils Absolute: 0.1 10*3/uL (ref 0.0–0.1)
Basophils Relative: 1.2 % (ref 0.0–3.0)
Eosinophils Absolute: 0.1 10*3/uL (ref 0.0–0.7)
Eosinophils Relative: 1.9 % (ref 0.0–5.0)
HCT: 40.3 % (ref 36.0–46.0)
Hemoglobin: 13.6 g/dL (ref 12.0–15.0)
Lymphocytes Relative: 35.6 % (ref 12.0–46.0)
Lymphs Abs: 2.4 10*3/uL (ref 0.7–4.0)
MCHC: 33.8 g/dL (ref 30.0–36.0)
MCV: 84.3 fl (ref 78.0–100.0)
Monocytes Absolute: 0.7 10*3/uL (ref 0.1–1.0)
Monocytes Relative: 10.7 % (ref 3.0–12.0)
Neutro Abs: 3.4 10*3/uL (ref 1.4–7.7)
Neutrophils Relative %: 50.6 % (ref 43.0–77.0)
Platelets: 226 10*3/uL (ref 150.0–400.0)
RBC: 4.78 Mil/uL (ref 3.87–5.11)
RDW: 13.4 % (ref 11.5–15.5)
WBC: 6.6 10*3/uL (ref 4.0–10.5)

## 2020-04-13 LAB — VITAMIN D 25 HYDROXY (VIT D DEFICIENCY, FRACTURES): VITD: 63.8 ng/mL (ref 30.00–100.00)

## 2020-04-13 LAB — COMPREHENSIVE METABOLIC PANEL
ALT: 9 U/L (ref 0–35)
AST: 16 U/L (ref 0–37)
Albumin: 4.4 g/dL (ref 3.5–5.2)
Alkaline Phosphatase: 53 U/L (ref 39–117)
BUN: 25 mg/dL — ABNORMAL HIGH (ref 6–23)
CO2: 27 mEq/L (ref 19–32)
Calcium: 9.7 mg/dL (ref 8.4–10.5)
Chloride: 102 mEq/L (ref 96–112)
Creatinine, Ser: 1.04 mg/dL (ref 0.40–1.20)
GFR: 49.11 mL/min — ABNORMAL LOW (ref >60.00)
Glucose, Bld: 103 mg/dL — ABNORMAL HIGH (ref 70–99)
Potassium: 3.7 mEq/L (ref 3.5–5.1)
Sodium: 140 mEq/L (ref 135–145)
Total Bilirubin: 0.6 mg/dL (ref 0.2–1.2)
Total Protein: 7 g/dL (ref 6.0–8.3)

## 2020-04-13 LAB — HM MAMMOGRAPHY

## 2020-04-13 NOTE — Addendum Note (Signed)
Addended by: Jacob Moores on: 04/13/2020 10:11 AM   Modules accepted: Orders

## 2020-04-17 ENCOUNTER — Encounter: Payer: Self-pay | Admitting: Internal Medicine

## 2020-05-11 ENCOUNTER — Other Ambulatory Visit: Payer: Self-pay

## 2020-05-14 ENCOUNTER — Encounter: Payer: Self-pay | Admitting: Oncology

## 2020-05-14 ENCOUNTER — Other Ambulatory Visit: Payer: Self-pay

## 2020-05-14 ENCOUNTER — Encounter: Payer: Self-pay | Admitting: Internal Medicine

## 2020-05-14 ENCOUNTER — Ambulatory Visit (INDEPENDENT_AMBULATORY_CARE_PROVIDER_SITE_OTHER): Payer: Medicare Other | Admitting: Internal Medicine

## 2020-05-14 VITALS — BP 112/72 | HR 73 | Temp 98.2°F | Wt 132.2 lb

## 2020-05-14 DIAGNOSIS — R413 Other amnesia: Secondary | ICD-10-CM | POA: Diagnosis not present

## 2020-05-14 DIAGNOSIS — I63232 Cerebral infarction due to unspecified occlusion or stenosis of left carotid arteries: Secondary | ICD-10-CM

## 2020-05-14 DIAGNOSIS — E785 Hyperlipidemia, unspecified: Secondary | ICD-10-CM | POA: Diagnosis not present

## 2020-05-14 DIAGNOSIS — F4321 Adjustment disorder with depressed mood: Secondary | ICD-10-CM | POA: Diagnosis not present

## 2020-05-14 DIAGNOSIS — F32A Depression, unspecified: Secondary | ICD-10-CM | POA: Diagnosis not present

## 2020-05-14 DIAGNOSIS — T466X5A Adverse effect of antihyperlipidemic and antiarteriosclerotic drugs, initial encounter: Secondary | ICD-10-CM

## 2020-05-14 DIAGNOSIS — Z23 Encounter for immunization: Secondary | ICD-10-CM

## 2020-05-14 DIAGNOSIS — G720 Drug-induced myopathy: Secondary | ICD-10-CM

## 2020-05-14 MED ORDER — ZOLPIDEM TARTRATE 10 MG PO TABS: 5.0000 mg | ORAL_TABLET | Freq: Every evening | ORAL | 0 refills | Status: DC | PRN

## 2020-05-14 NOTE — Assessment & Plan Note (Signed)
On Zetia 

## 2020-05-14 NOTE — Progress Notes (Signed)
Subjective:  Patient ID: Cynthia Garza, female    DOB: 10-Apr-1933  Age: 86 y.o. MRN: [id number]  CC: No chief complaint on file.   HPIHTN,  Cynthia Garza presents for HTN, dyslipidemia, CVA f/u C/o some memory loss, decreased focus. Recent labs were reviewed  Outpatient Medications Prior to Visit  Medication Sig Dispense Refill  . albuterol (PROVENTIL HFA) 108 (90 Base) MCG/ACT inhaler Inhale 2 puffs into the lungs every 4 (four) hours as needed for wheezing or shortness of breath. (Patient not taking: Reported on 01/16/2020) 8.5 g 3  . amLODipine (NORVASC) 5 MG tablet TAKE 1 TABLET BY MOUTH EVERY DAY 90 tablet 3  . BIOTIN PO Take by mouth.    Marland Kitchen buPROPion (WELLBUTRIN XL) 150 MG 24 hr tablet TAKE 1 TABLET BY MOUTH EVERY DAY 90 tablet 3  . Cholecalciferol (VITAMIN D3) 50 MCG (2000 UT) capsule Take 1 capsule (2,000 Units total) by mouth daily. 100 capsule 3  . clopidogrel (PLAVIX) 75 MG tablet TAKE 1 TABLET BY MOUTH EVERY DAY 90 tablet 1  . Coenzyme Q10 (CO Q-10 PO) Take by mouth.    . ezetimibe (ZETIA) 10 MG tablet Take 1 tablet (10 mg total) by mouth daily. 90 tablet 3  . Famotidine (PEPCID PO) Take by mouth.    . meclizine (ANTIVERT) 12.5 MG tablet Take 1 tablet (12.5 mg total) by mouth 2 (two) times daily as needed for dizziness. 60 tablet 2  . methylPREDNISolone (MEDROL DOSEPAK) 4 MG TBPK tablet As directed 21 tablet 0  . Multiple Vitamins-Minerals (ALIVE ONCE DAILY WOMENS 50+) TABS Take 1 tablet by mouth daily.    . Multiple Vitamins-Minerals (PRESERVISION AREDS 2 PO) Take 1 tablet by mouth 2 (two) times daily.    Marland Kitchen olmesartan-hydrochlorothiazide (BENICAR HCT) 40-25 MG tablet TAKE 1 TABLET BY MOUTH EVERY DAY 90 tablet 3  . Propylene Glycol (SYSTANE BALANCE) 0.6 % SOLN Place 2 drops into both eyes at bedtime.    . vitamin B-12 (CYANOCOBALAMIN) 1000 MCG tablet Take 1,000 mcg by mouth daily.    Marland Kitchen zolpidem (AMBIEN) 10 MG tablet Take 1 tablet (10 mg total) by mouth at bedtime as  needed for sleep. 90 tablet 1   No facility-administered medications prior to visit.    ROS: Review of Systems  Constitutional: Negative for activity change, appetite change, chills, fatigue and unexpected weight change.  HENT: Negative for congestion, mouth sores and sinus pressure.   Eyes: Negative for visual disturbance.  Respiratory: Negative for cough and chest tightness.   Gastrointestinal: Negative for abdominal pain and nausea.  Genitourinary: Negative for difficulty urinating, frequency and vaginal pain.  Musculoskeletal: Negative for back pain and gait problem.  Skin: Negative for pallor and rash.  Neurological: Negative for dizziness, tremors, weakness, numbness and headaches.  Psychiatric/Behavioral: Positive for decreased concentration. Negative for confusion and sleep disturbance. The patient is not nervous/anxious.     Objective:  BP 112/72 (BP Location: Left Arm)   Pulse 73   Temp 98.2 F (36.8 C) (Oral)   Wt 132 lb 3.2 oz (60 kg)   SpO2 97%   BMI 24.98 kg/m   BP Readings from Last 3 Encounters:  05/14/20 112/72  01/16/20 (!) 128/58  10/12/19 128/68    Wt Readings from Last 3 Encounters:  05/14/20 132 lb 3.2 oz (60 kg)  01/16/20 129 lb 6.4 oz (58.7 kg)  10/12/19 128 lb (58.1 kg)    Physical Exam Constitutional:      General: She is  not in acute distress.    Appearance: She is well-developed.  HENT:     Head: Normocephalic.     Right Ear: External ear normal.     Left Ear: External ear normal.     Nose: Nose normal.     Mouth/Throat:     Mouth: Oropharynx is clear and moist.  Eyes:     General:        Right eye: No discharge.        Left eye: No discharge.     Conjunctiva/sclera: Conjunctivae normal.     Pupils: Pupils are equal, round, and reactive to light.  Neck:     Thyroid: No thyromegaly.     Vascular: No JVD.     Trachea: No tracheal deviation.  Cardiovascular:     Rate and Rhythm: Normal rate and regular rhythm.     Heart sounds:  Normal heart sounds.  Pulmonary:     Effort: No respiratory distress.     Breath sounds: No stridor. No wheezing.  Abdominal:     General: Bowel sounds are normal. There is no distension.     Palpations: Abdomen is soft. There is no mass.     Tenderness: There is no abdominal tenderness. There is no guarding or rebound.  Musculoskeletal:        General: No tenderness or edema.     Cervical back: Normal range of motion and neck supple.  Lymphadenopathy:     Cervical: No cervical adenopathy.  Skin:    Findings: No erythema or rash.  Neurological:     Mental Status: She is oriented to person, place, and time.     Cranial Nerves: No cranial nerve deficit.     Motor: No abnormal muscle tone.     Coordination: Coordination normal.     Deep Tendon Reflexes: Reflexes normal.  Psychiatric:        Mood and Affect: Mood and affect normal.        Behavior: Behavior normal.        Thought Content: Thought content normal.        Judgment: Judgment normal.     Lab Results  Component Value Date   WBC 6.6 04/13/2020   HGB 13.6 04/13/2020   HCT 40.3 04/13/2020   PLT 226.0 04/13/2020   GLUCOSE 103 (H) 04/13/2020   CHOL 207 (H) 04/13/2020   TRIG 148.0 04/13/2020   HDL 60.10 04/13/2020   LDLDIRECT 96.0 04/13/2019   LDLCALC 117 (H) 04/13/2020   ALT 9 04/13/2020   AST 16 04/13/2020   NA 140 04/13/2020   K 3.7 04/13/2020   CL 102 04/13/2020   CREATININE 1.04 04/13/2020   BUN 25 (H) 04/13/2020   CO2 27 04/13/2020   TSH 2.36 04/13/2020   INR 1.0 10/24/2018   HGBA1C 5.6 10/24/2018    CT Head Wo Contrast  Result Date: 10/24/2018 CLINICAL DATA:  Right upper extremity heaviness and lower extremity numbness EXAM: CT HEAD WITHOUT CONTRAST TECHNIQUE: Contiguous axial images were obtained from the base of the skull through the vertex without intravenous contrast. COMPARISON:  None. FINDINGS: Brain: No acute territorial infarction, hemorrhage, or intracranial mass. The ventricles are  nonenlarged. Vascular: No hyperdense vessels.  Carotid vascular calcification. Skull: Normal. Negative for fracture or focal lesion. Sinuses/Orbits: No acute finding. Other: Chronic appearing abnormality at the anterior arch of C1, incompletely visualized. Prominent degenerative changes at the C1-C2 articulation. IMPRESSION: Negative non contrasted CT appearance of the brain.  Mild atrophy Electronically Signed  By: Donavan Foil M.D.   On: 10/24/2018 01:00   MR BRAIN WO CONTRAST  Result Date: 10/24/2018 CLINICAL DATA:  Initial evaluation for new onset right-sided arm and leg weakness. EXAM: MRI HEAD WITHOUT CONTRAST MRA HEAD WITHOUT CONTRAST TECHNIQUE: Multiplanar, multiecho pulse sequences of the brain and surrounding structures were obtained without intravenous contrast. Angiographic images of the head were obtained using MRA technique without contrast. COMPARISON:  Prior CT from earlier the same day. FINDINGS: MRI HEAD FINDINGS Brain: Cerebral volume within normal limits for age. Minimal T2/FLAIR hyperintensity noted within the periventricular white matter, nonspecific, and felt to be within normal limits for age. 7 mm focus of diffusion abnormality seen at the mid-posterior left centrum semi ovale (series 5, image 80). No associated hemorrhage. No other evidence for acute or subacute ischemia. Gray-white matter differentiation otherwise maintained. No other areas of remote cortical infarction. No foci of susceptibility artifact to suggest acute or chronic intracranial hemorrhage. No mass lesion, midline shift or mass effect. No hydrocephalus. No extra-axial fluid collection. Pituitary gland suprasellar region normal. Midline structures intact. Vascular: Major intracranial vascular flow voids are maintained. Skull and upper cervical spine: Craniocervical junction normal. Bone marrow signal intensity within normal limits. Hyperostosis frontalis interna noted. Scalp soft tissues unremarkable. Sinuses/Orbits:  Patient status post bilateral ocular lens replacement. Globes orbital soft tissues demonstrate no acute finding. Paranasal sinuses are clear. No mastoid effusion. Inner ear structures normal. Other: None. MRA HEAD FINDINGS ANTERIOR CIRCULATION: Distal cervical segments of the internal carotid arteries are patent with symmetric antegrade flow. Petrous segments widely patent bilaterally. Scattered atheromatous irregularity within the cavernous/supraclinoid ICAs. No significant stenosis seen on the right. There is a short-segment severe stenosis at the anterior genu of the cavernous left ICA (series 9, image 104). ICA termini well perfused. A1 segments patent bilaterally. Normal anterior communicating artery. Anterior cerebral arteries widely patent to their distal aspects. M1 segments widely patent bilaterally. Normal MCA bifurcations. Distal MCA branches well perfused and symmetric. Mild distal small vessel atheromatous irregularity. POSTERIOR CIRCULATION: Vertebral arteries patent to the vertebrobasilar junction without stenosis. Left vertebral artery dominant. Posterior inferior cerebral arteries patent bilaterally. Basilar patent to its distal aspect without stenosis. Superior cerebral arteries patent bilaterally. Both of the posterior cerebral arteries primarily supplied via the basilar and are well perfused to their distal aspects. No intracranial aneurysm. IMPRESSION: MRI HEAD IMPRESSION: 1. 7 mm acute ischemic nonhemorrhagic infarct involving the deep white matter of the mid-posterior left centrum semi ovale. 2. Otherwise normal brain MRI for age. MRA HEAD IMPRESSION: 1. Negative intracranial MRA for large vessel occlusion. 2. Short-segment severe cavernous left ICA stenosis. 3. Otherwise negative intracranial MRA. No other hemodynamically significant or correctable stenosis. Electronically Signed   By: Jeannine Boga M.D.   On: 10/24/2018 06:59   DG Chest Portable 1 View  Result Date:  10/24/2018 CLINICAL DATA:  TIA EXAM: PORTABLE CHEST 1 VIEW COMPARISON:  08/14/2017 FINDINGS: The heart size and mediastinal contours are within normal limits. Both lungs are clear. Surgical clips over the axilla bilaterally. Aortic atherosclerosis. IMPRESSION: No active disease. Electronically Signed   By: Donavan Foil M.D.   On: 10/24/2018 00:43   MR MRA HEAD WO CONTRAST  Result Date: 10/24/2018 CLINICAL DATA:  Initial evaluation for new onset right-sided arm and leg weakness. EXAM: MRI HEAD WITHOUT CONTRAST MRA HEAD WITHOUT CONTRAST TECHNIQUE: Multiplanar, multiecho pulse sequences of the brain and surrounding structures were obtained without intravenous contrast. Angiographic images of the head were obtained using MRA technique without  contrast. COMPARISON:  Prior CT from earlier the same day. FINDINGS: MRI HEAD FINDINGS Brain: Cerebral volume within normal limits for age. Minimal T2/FLAIR hyperintensity noted within the periventricular white matter, nonspecific, and felt to be within normal limits for age. 7 mm focus of diffusion abnormality seen at the mid-posterior left centrum semi ovale (series 5, image 80). No associated hemorrhage. No other evidence for acute or subacute ischemia. Gray-white matter differentiation otherwise maintained. No other areas of remote cortical infarction. No foci of susceptibility artifact to suggest acute or chronic intracranial hemorrhage. No mass lesion, midline shift or mass effect. No hydrocephalus. No extra-axial fluid collection. Pituitary gland suprasellar region normal. Midline structures intact. Vascular: Major intracranial vascular flow voids are maintained. Skull and upper cervical spine: Craniocervical junction normal. Bone marrow signal intensity within normal limits. Hyperostosis frontalis interna noted. Scalp soft tissues unremarkable. Sinuses/Orbits: Patient status post bilateral ocular lens replacement. Globes orbital soft tissues demonstrate no acute  finding. Paranasal sinuses are clear. No mastoid effusion. Inner ear structures normal. Other: None. MRA HEAD FINDINGS ANTERIOR CIRCULATION: Distal cervical segments of the internal carotid arteries are patent with symmetric antegrade flow. Petrous segments widely patent bilaterally. Scattered atheromatous irregularity within the cavernous/supraclinoid ICAs. No significant stenosis seen on the right. There is a short-segment severe stenosis at the anterior genu of the cavernous left ICA (series 9, image 104). ICA termini well perfused. A1 segments patent bilaterally. Normal anterior communicating artery. Anterior cerebral arteries widely patent to their distal aspects. M1 segments widely patent bilaterally. Normal MCA bifurcations. Distal MCA branches well perfused and symmetric. Mild distal small vessel atheromatous irregularity. POSTERIOR CIRCULATION: Vertebral arteries patent to the vertebrobasilar junction without stenosis. Left vertebral artery dominant. Posterior inferior cerebral arteries patent bilaterally. Basilar patent to its distal aspect without stenosis. Superior cerebral arteries patent bilaterally. Both of the posterior cerebral arteries primarily supplied via the basilar and are well perfused to their distal aspects. No intracranial aneurysm. IMPRESSION: MRI HEAD IMPRESSION: 1. 7 mm acute ischemic nonhemorrhagic infarct involving the deep white matter of the mid-posterior left centrum semi ovale. 2. Otherwise normal brain MRI for age. MRA HEAD IMPRESSION: 1. Negative intracranial MRA for large vessel occlusion. 2. Short-segment severe cavernous left ICA stenosis. 3. Otherwise negative intracranial MRA. No other hemodynamically significant or correctable stenosis. Electronically Signed   By: Jeannine Boga M.D.   On: 10/24/2018 06:59   ECHOCARDIOGRAM COMPLETE  Result Date: 10/24/2018   ECHOCARDIOGRAM REPORT   Patient Name:   Cynthia Garza Date of Exam: 10/24/2018 Medical Rec #:   [id number]           Height:       61.0 in Accession #:    [id number]          Weight:       135.0 lb Date of Birth:  Sep 27, 1932            BSA:          1.60 m Patient Age:    50 years            BP:           118/57 mmHg Patient Gender: F                   HR:           75 bpm. Exam Location:  Inpatient  Procedure: 2D Echo Indications:    Stroke 434.91  History:        Patient has no prior  history of Echocardiogram examinations.                 Signs/Symptoms: Murmur Risk Factors: Hypertension and                 Dyslipidemia.  Sonographer:    Mikki Santee RDCS (AE) Referring Phys: [place]  1. The left ventricle has hyperdynamic systolic function, with an ejection fraction of >65%. The cavity size was normal. Left ventricular diastolic Doppler parameters are consistent with impaired relaxation.  2. The right ventricle has normal systolic function. The cavity was normal. There is no increase in right ventricular wall thickness.  3. Left atrial size was mildly dilated.  4. The mitral valve is abnormal. Mild thickening of the mitral valve leaflet. There is moderate mitral annular calcification present.  5. The aortic valve is tricuspid. Mild thickening of the aortic valve. Moderate calcification of the aortic valve.  6. The interatrial septum was not assessed. FINDINGS  Left Ventricle: The left ventricle has hyperdynamic systolic function, with an ejection fraction of >65%. The cavity size was normal. The left ventricular wall thickness was not assessed. Left ventricular diastolic Doppler parameters are consistent with  impaired relaxation. Right Ventricle: The right ventricle has normal systolic function. The cavity was normal. There is no increase in right ventricular wall thickness. Left Atrium: Left atrial size was mildly dilated. Right Atrium: Right atrial size was normal in size. Right atrial pressure is estimated at 10 mmHg. Interatrial Septum: The interatrial septum was not  assessed. Pericardium: There is no evidence of pericardial effusion. Mitral Valve: The mitral valve is abnormal. Mild thickening of the mitral valve leaflet. There is moderate mitral annular calcification present. Mitral valve regurgitation is trivial by color flow Doppler. Tricuspid Valve: The tricuspid valve is normal in structure. Tricuspid valve regurgitation is trivial by color flow Doppler. Aortic Valve: The aortic valve is tricuspid Mild thickening of the aortic valve. Moderate calcification of the aortic valve. Aortic valve regurgitation was not visualized by color flow Doppler. Pulmonic Valve: The pulmonic valve was normal in structure. Pulmonic valve regurgitation is not visualized by color flow Doppler. Venous: The inferior vena cava is normal in size with greater than 50% respiratory variability.  +--------------+--------++ LEFT VENTRICLE         +----------------+---------++ +--------------+--------++ Diastology                PLAX 2D                +----------------+---------++ +--------------+--------++ LV e' lateral:  5.98 cm/s LVIDd:        3.60 cm  +----------------+---------++ +--------------+--------++ LV E/e' lateral:18.6      LVIDs:        2.40 cm  +----------------+---------++ +--------------+--------++ LV e' medial:   4.90 cm/s LV PW:        1.00 cm  +----------------+---------++ +--------------+--------++ LV E/e' medial: 22.7      LV IVS:       1.10 cm  +----------------+---------++ +--------------+--------++ LVOT diam:    2.00 cm  +--------------+--------++ LV SV:        34 ml    +--------------+--------++ LV SV Index:  20.93    +--------------+--------++ LVOT Area:    3.14 cm +--------------+--------++                        +--------------+--------++ +---------------+----------++ RIGHT VENTRICLE           +---------------+----------++ RV S prime:  12.10 cm/s +---------------+----------++ TAPSE (M-mode):2.2 cm      +---------------+----------++ RVSP:          24.2 mmHg  +---------------+----------++ +---------------+-------++-----------++ LEFT ATRIUM           Index       +---------------+-------++-----------++ LA diam:       2.80 cm1.75 cm/m  +---------------+-------++-----------++ LA Vol (A2C):  51.5 ml32.22 ml/m +---------------+-------++-----------++ LA Vol (A4C):  56.7 ml35.48 ml/m +---------------+-------++-----------++ LA Biplane Vol:57.8 ml36.17 ml/m +---------------+-------++-----------++ +------------+---------++-----------++ RIGHT ATRIUM         Index       +------------+---------++-----------++ RA Pressure:3.00 mmHg            +------------+---------++-----------++ RA Area:    11.30 cm            +------------+---------++-----------++ RA Volume:  24.70 ml 15.45 ml/m +------------+---------++-----------++  +------------+-----------++ AORTIC VALVE            +------------+-----------++ LVOT Vmax:  122.00 cm/s +------------+-----------++ LVOT Vmean: 86.500 cm/s +------------+-----------++ LVOT VTI:   0.330 m     +------------+-----------++  +-------------+-------++ AORTA                +-------------+-------++ Ao Root diam:3.00 cm +-------------+-------++ +--------------+-----------++ +---------------+-----------++ MITRAL VALVE              TRICUSPID VALVE            +--------------+-----------++ +---------------+-----------++ MV Area (PHT):2.08 cm    TR Peak grad:  21.2 mmHg   +--------------+-----------++ +---------------+-----------++ MV PHT:       106.00 msec TR Vmax:       230.00 cm/s +--------------+-----------++ +---------------+-----------++ +--------------+-----------++ Estimated RAP: 3.00 mmHg   MV E velocity:111.00 cm/s +---------------+-----------++ +--------------+-----------++ RVSP:          24.2 mmHg   MV A velocity:156.00 cm/s +---------------+-----------++  +--------------+-----------++ MV E/A ratio: 0.71        +--------------+-------+ +--------------+-----------++ SHUNTS                                              +--------------+-------+                               Systemic VTI: 0.33 m                                +--------------+-------+                               Systemic Diam:2.00 cm                               +--------------+-------+  Dorris Carnes MD Electronically signed by Dorris Carnes MD Signature Date/Time: 10/24/2018/1:35:21 PM    Final    VAS US CAROTID (at [facility] and [facility] only)  Result Date: 10/25/2018 Carotid Arterial Duplex Study Indications:  CVA. Risk Factors: Hypertension. Performing Technologist: Abram Sander RVS  Examination Guidelines: A complete evaluation includes B-mode imaging, spectral Doppler, color Doppler, and power Doppler as needed of all accessible portions of each vessel. Bilateral testing is considered an integral part of a complete examination. Limited examinations for reoccurring indications may be performed as noted.  Right Carotid Findings: +----------+--------+--------+--------+------------+--------+  PSV cm/sEDV cm/sStenosisDescribe    Comments +----------+--------+--------+--------+------------+--------+ CCA Prox  110     19              heterogenous         +----------+--------+--------+--------+------------+--------+ CCA Distal103     19              heterogenous         +----------+--------+--------+--------+------------+--------+ ICA Prox  73      17      1-39%   heterogenous         +----------+--------+--------+--------+------------+--------+ ICA Distal106     24                                   +----------+--------+--------+--------+------------+--------+ ECA       94      6                                    +----------+--------+--------+--------+------------+--------+ +----------+--------+-------+--------+-------------------+           PSV cm/sEDV  cmsDescribeArm Pressure (mmHG) +----------+--------+-------+--------+-------------------+ [id number]                                        +----------+--------+-------+--------+-------------------+ +---------+--------+--+--------+--+---------+ VertebralPSV cm/s37EDV cm/s10Antegrade +---------+--------+--+--------+--+---------+  Left Carotid Findings: +----------+--------+--------+--------+------------+--------+           PSV cm/sEDV cm/sStenosisDescribe    Comments +----------+--------+--------+--------+------------+--------+ CCA Prox  132     14              heterogenous         +----------+--------+--------+--------+------------+--------+ CCA Distal100     17              heterogenous         +----------+--------+--------+--------+------------+--------+ ICA Prox  107     25      1-39%   heterogenous         +----------+--------+--------+--------+------------+--------+ ICA Distal82      13                                   +----------+--------+--------+--------+------------+--------+ ECA       80                                           +----------+--------+--------+--------+------------+--------+ +----------+--------+--------+--------+-------------------+ SubclavianPSV cm/sEDV cm/sDescribeArm Pressure (mmHG) +----------+--------+--------+--------+-------------------+           138                                         +----------+--------+--------+--------+-------------------+ +---------+--------+--+--------+--+---------+ VertebralPSV cm/s67EDV cm/s17Antegrade +---------+--------+--+--------+--+---------+  Summary: Right Carotid: Velocities in the right ICA are consistent with a 1-39% stenosis. Left Carotid: Velocities in the left ICA are consistent with a 1-39% stenosis. Vertebrals: Bilateral vertebral arteries demonstrate antegrade flow. *See table(s) above for measurements and observations.  Electronically signed by Antony Contras  MD on 10/25/2018 at 12:23:35 PM.    Final     Assessment & Plan:    Walker Kehr, MD

## 2020-05-14 NOTE — Assessment & Plan Note (Signed)
Cynthia Garza died in March 2021 Doing better

## 2020-05-14 NOTE — Assessment & Plan Note (Addendum)
mostly MS on the R has recovered  Cont w/Plavix, Pravachol - d/c, Zetia, Benicar HCT, Amlodipine

## 2020-05-14 NOTE — Assessment & Plan Note (Signed)
Off Pravastatin 

## 2020-05-14 NOTE — Assessment & Plan Note (Signed)
On Wellbutrin

## 2020-05-14 NOTE — Patient Instructions (Addendum)
     Lion's mane for memory

## 2020-05-14 NOTE — Assessment & Plan Note (Signed)
Worse. Try Lion's mane

## 2020-06-26 ENCOUNTER — Other Ambulatory Visit: Payer: Self-pay | Admitting: Internal Medicine

## 2020-07-04 ENCOUNTER — Other Ambulatory Visit: Payer: Self-pay | Admitting: Internal Medicine

## 2020-07-04 MED ORDER — ONDANSETRON HCL 4 MG PO TABS: 4.0000 mg | ORAL_TABLET | Freq: Three times a day (TID) | ORAL | 0 refills | Status: DC | PRN

## 2020-07-06 NOTE — Telephone Encounter (Signed)
Patients daughter called and said that the diarrhea and nausea have started again and was wondering if there is anything else that could be done. Please call the daughter back at [phone].

## 2020-07-06 NOTE — Telephone Encounter (Signed)
MD is put of the office please advise.Marland KitchenAndee Poles

## 2020-07-11 ENCOUNTER — Other Ambulatory Visit: Payer: Self-pay | Admitting: Adult Health

## 2020-07-11 ENCOUNTER — Other Ambulatory Visit: Payer: Self-pay | Admitting: Internal Medicine

## 2020-08-28 ENCOUNTER — Other Ambulatory Visit: Payer: Self-pay | Admitting: Adult Health

## 2020-08-28 ENCOUNTER — Other Ambulatory Visit: Payer: Self-pay

## 2020-10-25 ENCOUNTER — Encounter: Payer: Self-pay | Admitting: Oncology

## 2020-11-02 DIAGNOSIS — H353132 Nonexudative age-related macular degeneration, bilateral, intermediate dry stage: Secondary | ICD-10-CM | POA: Diagnosis not present

## 2020-11-12 ENCOUNTER — Ambulatory Visit (INDEPENDENT_AMBULATORY_CARE_PROVIDER_SITE_OTHER): Payer: Medicare Other | Admitting: Internal Medicine

## 2020-11-12 ENCOUNTER — Encounter: Payer: Self-pay | Admitting: Internal Medicine

## 2020-11-12 ENCOUNTER — Ambulatory Visit (INDEPENDENT_AMBULATORY_CARE_PROVIDER_SITE_OTHER): Payer: Medicare Other

## 2020-11-12 ENCOUNTER — Other Ambulatory Visit: Payer: Self-pay

## 2020-11-12 VITALS — BP 116/70 | HR 80 | Temp 98.2°F | Ht 61.0 in | Wt 133.6 lb

## 2020-11-12 DIAGNOSIS — Z0000 Encounter for general adult medical examination without abnormal findings: Secondary | ICD-10-CM | POA: Diagnosis not present

## 2020-11-12 DIAGNOSIS — G629 Polyneuropathy, unspecified: Secondary | ICD-10-CM | POA: Diagnosis not present

## 2020-11-12 DIAGNOSIS — E785 Hyperlipidemia, unspecified: Secondary | ICD-10-CM | POA: Diagnosis not present

## 2020-11-12 DIAGNOSIS — Z8673 Personal history of transient ischemic attack (TIA), and cerebral infarction without residual deficits: Secondary | ICD-10-CM | POA: Diagnosis not present

## 2020-11-12 DIAGNOSIS — I69359 Hemiplegia and hemiparesis following cerebral infarction affecting unspecified side: Secondary | ICD-10-CM | POA: Diagnosis not present

## 2020-11-12 DIAGNOSIS — I10 Essential (primary) hypertension: Secondary | ICD-10-CM

## 2020-11-12 MED ORDER — EZETIMIBE 10 MG PO TABS: 10.0000 mg | ORAL_TABLET | Freq: Every day | ORAL | 3 refills | Status: DC

## 2020-11-12 MED ORDER — GABAPENTIN 100 MG PO CAPS: 100.0000 mg | ORAL_CAPSULE | Freq: Every day | ORAL | 5 refills | Status: DC

## 2020-11-12 MED ORDER — ZOLPIDEM TARTRATE 10 MG PO TABS: 5.0000 mg | ORAL_TABLET | Freq: Every evening | ORAL | 1 refills | Status: DC | PRN

## 2020-11-12 NOTE — Progress Notes (Signed)
Subjective:  Patient ID: Cynthia Garza, female    DOB: June 15, 1932  Age: 87 y.o. MRN: [id number]  CC: Follow-up (6 month f/u- Req refill on Zetia & Ambien)   HPI Cynthia Garza presents for HTN, CVA, depression C/o feet burning at night  Outpatient Medications Prior to Visit  Medication Sig Dispense Refill   amLODipine (NORVASC) 5 MG tablet TAKE 1 TABLET BY MOUTH EVERY DAY 90 tablet 1   BIOTIN PO Take by mouth.     buPROPion (WELLBUTRIN XL) 150 MG 24 hr tablet TAKE 1 TABLET BY MOUTH EVERY DAY 90 tablet 3   Cholecalciferol (VITAMIN D3) 50 MCG (2000 UT) capsule Take 1 capsule (2,000 Units total) by mouth daily. 100 capsule 3   clopidogrel (PLAVIX) 75 MG tablet TAKE 1 TABLET BY MOUTH EVERY DAY 90 tablet 1   ezetimibe (ZETIA) 10 MG tablet Take 1 tablet (10 mg total) by mouth daily. 90 tablet 3   Famotidine (PEPCID PO) Take by mouth.     meclizine (ANTIVERT) 12.5 MG tablet Take 1 tablet (12.5 mg total) by mouth 2 (two) times daily as needed for dizziness. 60 tablet 2   Multiple Vitamins-Minerals (ALIVE ONCE DAILY WOMENS 50+) TABS Take 1 tablet by mouth daily.     Multiple Vitamins-Minerals (PRESERVISION AREDS 2 PO) Take 1 tablet by mouth 2 (two) times daily.     olmesartan-hydrochlorothiazide (BENICAR HCT) 40-25 MG tablet TAKE 1 TABLET BY MOUTH EVERY DAY 90 tablet 3   ondansetron (ZOFRAN) 4 MG tablet Take 1 tablet (4 mg total) by mouth every 8 (eight) hours as needed for nausea or vomiting. 15 tablet 0   Propylene Glycol 0.6 % SOLN Place 2 drops into both eyes at bedtime.     vitamin B-12 (CYANOCOBALAMIN) 1000 MCG tablet Take 1,000 mcg by mouth daily.     zolpidem (AMBIEN) 10 MG tablet Take 0.5-1 tablets (5-10 mg total) by mouth at bedtime as needed for sleep. 90 tablet 0   albuterol (PROVENTIL HFA) 108 (90 Base) MCG/ACT inhaler Inhale 2 puffs into the lungs every 4 (four) hours as needed for wheezing or shortness of breath. 8.5 g 3   Coenzyme Q10 (CO Q-10 PO) Take by mouth.  (Patient not taking: No sig reported)     methylPREDNISolone (MEDROL DOSEPAK) 4 MG TBPK tablet As directed (Patient not taking: No sig reported) 21 tablet 0   No facility-administered medications prior to visit.    ROS: Review of Systems  Constitutional:  Positive for fatigue. Negative for activity change, appetite change, chills and unexpected weight change.  HENT:  Negative for congestion, mouth sores and sinus pressure.   Eyes:  Negative for visual disturbance.  Respiratory:  Negative for cough and chest tightness.   Gastrointestinal:  Negative for abdominal pain and nausea.  Genitourinary:  Negative for difficulty urinating, frequency and vaginal pain.  Musculoskeletal:  Negative for gait problem.  Skin:  Negative for pallor and rash.  Neurological:  Positive for numbness. Negative for dizziness, tremors, weakness and headaches.  Psychiatric/Behavioral:  Positive for dysphoric mood. Negative for confusion, sleep disturbance and suicidal ideas. The patient is nervous/anxious.    Objective:  BP 116/70 (BP Location: Left Arm)   Pulse 80   Temp 98.2 F (36.8 C) (Oral)   Ht 5\' 1"  (1.549 m)   Wt 133 lb 9.6 oz (60.6 kg)   SpO2 96%   BMI 25.24 kg/m   BP Readings from Last 3 Encounters:  11/12/20 116/70  11/12/20 116/70  05/14/20 112/72    Wt Readings from Last 3 Encounters:  11/12/20 133 lb 9.6 oz (60.6 kg)  11/12/20 133 lb 9.6 oz (60.6 kg)  05/14/20 132 lb 3.2 oz (60 kg)    Physical Exam Constitutional:      General: She is not in acute distress.    Appearance: She is well-developed.  HENT:     Head: Normocephalic.     Right Ear: External ear normal.     Left Ear: External ear normal.     Nose: Nose normal.  Eyes:     General:        Right eye: No discharge.        Left eye: No discharge.     Conjunctiva/sclera: Conjunctivae normal.     Pupils: Pupils are equal, round, and reactive to light.  Neck:     Thyroid: No thyromegaly.     Vascular: No JVD.      Trachea: No tracheal deviation.  Cardiovascular:     Rate and Rhythm: Normal rate and regular rhythm.     Heart sounds: Normal heart sounds.  Pulmonary:     Effort: No respiratory distress.     Breath sounds: No stridor. No wheezing.  Abdominal:     General: Bowel sounds are normal. There is no distension.     Palpations: Abdomen is soft. There is no mass.     Tenderness: There is no abdominal tenderness. There is no guarding or rebound.  Musculoskeletal:        General: No tenderness.     Cervical back: Normal range of motion and neck supple.  Lymphadenopathy:     Cervical: No cervical adenopathy.  Skin:    Findings: No erythema or rash.  Neurological:     Mental Status: She is oriented to person, place, and time.     Cranial Nerves: No cranial nerve deficit.     Motor: No abnormal muscle tone.     Coordination: Coordination normal.     Deep Tendon Reflexes: Reflexes normal.  Psychiatric:        Behavior: Behavior normal.        Thought Content: Thought content normal.        Judgment: Judgment normal.  Both feet with normal appearance, normal pulse and normal neuro exam The patient is somewhat sad  Lab Results  Component Value Date   WBC 6.6 04/13/2020   HGB 13.6 04/13/2020   HCT 40.3 04/13/2020   PLT 226.0 04/13/2020   GLUCOSE 103 (H) 04/13/2020   CHOL 207 (H) 04/13/2020   TRIG 148.0 04/13/2020   HDL 60.10 04/13/2020   LDLDIRECT 96.0 04/13/2019   LDLCALC 117 (H) 04/13/2020   ALT 9 04/13/2020   AST 16 04/13/2020   NA 140 04/13/2020   K 3.7 04/13/2020   CL 102 04/13/2020   CREATININE 1.04 04/13/2020   BUN 25 (H) 04/13/2020   CO2 27 04/13/2020   TSH 2.36 04/13/2020   INR 1.0 10/24/2018   HGBA1C 5.6 10/24/2018    CT Head Wo Contrast  Result Date: 10/24/2018 CLINICAL DATA:  Right upper extremity heaviness and lower extremity numbness EXAM: CT HEAD WITHOUT CONTRAST TECHNIQUE: Contiguous axial images were obtained from the base of the skull through the vertex  without intravenous contrast. COMPARISON:  None. FINDINGS: Brain: No acute territorial infarction, hemorrhage, or intracranial mass. The ventricles are nonenlarged. Vascular: No hyperdense vessels.  Carotid vascular calcification. Skull: Normal. Negative for fracture or focal lesion. Sinuses/Orbits: No acute finding. Other:  Chronic appearing abnormality at the anterior arch of C1, incompletely visualized. Prominent degenerative changes at the C1-C2 articulation. IMPRESSION: Negative non contrasted CT appearance of the brain.  Mild atrophy Electronically Signed   By: Donavan Foil M.D.   On: 10/24/2018 01:00   MR BRAIN WO CONTRAST  Result Date: 10/24/2018 CLINICAL DATA:  Initial evaluation for new onset right-sided arm and leg weakness. EXAM: MRI HEAD WITHOUT CONTRAST MRA HEAD WITHOUT CONTRAST TECHNIQUE: Multiplanar, multiecho pulse sequences of the brain and surrounding structures were obtained without intravenous contrast. Angiographic images of the head were obtained using MRA technique without contrast. COMPARISON:  Prior CT from earlier the same day. FINDINGS: MRI HEAD FINDINGS Brain: Cerebral volume within normal limits for age. Minimal T2/FLAIR hyperintensity noted within the periventricular white matter, nonspecific, and felt to be within normal limits for age. 7 mm focus of diffusion abnormality seen at the mid-posterior left centrum semi ovale (series 5, image 80). No associated hemorrhage. No other evidence for acute or subacute ischemia. Gray-white matter differentiation otherwise maintained. No other areas of remote cortical infarction. No foci of susceptibility artifact to suggest acute or chronic intracranial hemorrhage. No mass lesion, midline shift or mass effect. No hydrocephalus. No extra-axial fluid collection. Pituitary gland suprasellar region normal. Midline structures intact. Vascular: Major intracranial vascular flow voids are maintained. Skull and upper cervical spine: Craniocervical  junction normal. Bone marrow signal intensity within normal limits. Hyperostosis frontalis interna noted. Scalp soft tissues unremarkable. Sinuses/Orbits: Patient status post bilateral ocular lens replacement. Globes orbital soft tissues demonstrate no acute finding. Paranasal sinuses are clear. No mastoid effusion. Inner ear structures normal. Other: None. MRA HEAD FINDINGS ANTERIOR CIRCULATION: Distal cervical segments of the internal carotid arteries are patent with symmetric antegrade flow. Petrous segments widely patent bilaterally. Scattered atheromatous irregularity within the cavernous/supraclinoid ICAs. No significant stenosis seen on the right. There is a short-segment severe stenosis at the anterior genu of the cavernous left ICA (series 9, image 104). ICA termini well perfused. A1 segments patent bilaterally. Normal anterior communicating artery. Anterior cerebral arteries widely patent to their distal aspects. M1 segments widely patent bilaterally. Normal MCA bifurcations. Distal MCA branches well perfused and symmetric. Mild distal small vessel atheromatous irregularity. POSTERIOR CIRCULATION: Vertebral arteries patent to the vertebrobasilar junction without stenosis. Left vertebral artery dominant. Posterior inferior cerebral arteries patent bilaterally. Basilar patent to its distal aspect without stenosis. Superior cerebral arteries patent bilaterally. Both of the posterior cerebral arteries primarily supplied via the basilar and are well perfused to their distal aspects. No intracranial aneurysm. IMPRESSION: MRI HEAD IMPRESSION: 1. 7 mm acute ischemic nonhemorrhagic infarct involving the deep white matter of the mid-posterior left centrum semi ovale. 2. Otherwise normal brain MRI for age. MRA HEAD IMPRESSION: 1. Negative intracranial MRA for large vessel occlusion. 2. Short-segment severe cavernous left ICA stenosis. 3. Otherwise negative intracranial MRA. No other hemodynamically significant or  correctable stenosis. Electronically Signed   By: Jeannine Boga M.D.   On: 10/24/2018 06:59   DG Chest Portable 1 View  Result Date: 10/24/2018 CLINICAL DATA:  TIA EXAM: PORTABLE CHEST 1 VIEW COMPARISON:  08/14/2017 FINDINGS: The heart size and mediastinal contours are within normal limits. Both lungs are clear. Surgical clips over the axilla bilaterally. Aortic atherosclerosis. IMPRESSION: No active disease. Electronically Signed   By: Donavan Foil M.D.   On: 10/24/2018 00:43   MR MRA HEAD WO CONTRAST  Result Date: 10/24/2018 CLINICAL DATA:  Initial evaluation for new onset right-sided arm and leg weakness. EXAM: MRI  HEAD WITHOUT CONTRAST MRA HEAD WITHOUT CONTRAST TECHNIQUE: Multiplanar, multiecho pulse sequences of the brain and surrounding structures were obtained without intravenous contrast. Angiographic images of the head were obtained using MRA technique without contrast. COMPARISON:  Prior CT from earlier the same day. FINDINGS: MRI HEAD FINDINGS Brain: Cerebral volume within normal limits for age. Minimal T2/FLAIR hyperintensity noted within the periventricular white matter, nonspecific, and felt to be within normal limits for age. 7 mm focus of diffusion abnormality seen at the mid-posterior left centrum semi ovale (series 5, image 80). No associated hemorrhage. No other evidence for acute or subacute ischemia. Gray-white matter differentiation otherwise maintained. No other areas of remote cortical infarction. No foci of susceptibility artifact to suggest acute or chronic intracranial hemorrhage. No mass lesion, midline shift or mass effect. No hydrocephalus. No extra-axial fluid collection. Pituitary gland suprasellar region normal. Midline structures intact. Vascular: Major intracranial vascular flow voids are maintained. Skull and upper cervical spine: Craniocervical junction normal. Bone marrow signal intensity within normal limits. Hyperostosis frontalis interna noted. Scalp soft  tissues unremarkable. Sinuses/Orbits: Patient status post bilateral ocular lens replacement. Globes orbital soft tissues demonstrate no acute finding. Paranasal sinuses are clear. No mastoid effusion. Inner ear structures normal. Other: None. MRA HEAD FINDINGS ANTERIOR CIRCULATION: Distal cervical segments of the internal carotid arteries are patent with symmetric antegrade flow. Petrous segments widely patent bilaterally. Scattered atheromatous irregularity within the cavernous/supraclinoid ICAs. No significant stenosis seen on the right. There is a short-segment severe stenosis at the anterior genu of the cavernous left ICA (series 9, image 104). ICA termini well perfused. A1 segments patent bilaterally. Normal anterior communicating artery. Anterior cerebral arteries widely patent to their distal aspects. M1 segments widely patent bilaterally. Normal MCA bifurcations. Distal MCA branches well perfused and symmetric. Mild distal small vessel atheromatous irregularity. POSTERIOR CIRCULATION: Vertebral arteries patent to the vertebrobasilar junction without stenosis. Left vertebral artery dominant. Posterior inferior cerebral arteries patent bilaterally. Basilar patent to its distal aspect without stenosis. Superior cerebral arteries patent bilaterally. Both of the posterior cerebral arteries primarily supplied via the basilar and are well perfused to their distal aspects. No intracranial aneurysm. IMPRESSION: MRI HEAD IMPRESSION: 1. 7 mm acute ischemic nonhemorrhagic infarct involving the deep white matter of the mid-posterior left centrum semi ovale. 2. Otherwise normal brain MRI for age. MRA HEAD IMPRESSION: 1. Negative intracranial MRA for large vessel occlusion. 2. Short-segment severe cavernous left ICA stenosis. 3. Otherwise negative intracranial MRA. No other hemodynamically significant or correctable stenosis. Electronically Signed   By: Jeannine Boga M.D.   On: 10/24/2018 06:59   ECHOCARDIOGRAM  COMPLETE  Result Date: 10/24/2018   ECHOCARDIOGRAM REPORT   Patient Name:   PRIYAL MUSQUIZ Date of Exam: 10/24/2018 Medical Rec #:  [id number]           Height:       61.0 in Accession #:    [id number]          Weight:       135.0 lb Date of Birth:  18-Dec-1932            BSA:          1.60 m Patient Age:    45 years            BP:           118/57 mmHg Patient Gender: F                   HR:  75 bpm. Exam Location:  Inpatient  Procedure: 2D Echo Indications:    Stroke 434.91  History:        Patient has no prior history of Echocardiogram examinations.                 Signs/Symptoms: Murmur Risk Factors: Hypertension and                 Dyslipidemia.  Sonographer:    Mikki Santee RDCS (AE) Referring Phys: Stuart  1. The left ventricle has hyperdynamic systolic function, with an ejection fraction of >65%. The cavity size was normal. Left ventricular diastolic Doppler parameters are consistent with impaired relaxation.  2. The right ventricle has normal systolic function. The cavity was normal. There is no increase in right ventricular wall thickness.  3. Left atrial size was mildly dilated.  4. The mitral valve is abnormal. Mild thickening of the mitral valve leaflet. There is moderate mitral annular calcification present.  5. The aortic valve is tricuspid. Mild thickening of the aortic valve. Moderate calcification of the aortic valve.  6. The interatrial septum was not assessed. FINDINGS  Left Ventricle: The left ventricle has hyperdynamic systolic function, with an ejection fraction of >65%. The cavity size was normal. The left ventricular wall thickness was not assessed. Left ventricular diastolic Doppler parameters are consistent with  impaired relaxation. Right Ventricle: The right ventricle has normal systolic function. The cavity was normal. There is no increase in right ventricular wall thickness. Left Atrium: Left atrial size was mildly dilated. Right Atrium: Right  atrial size was normal in size. Right atrial pressure is estimated at 10 mmHg. Interatrial Septum: The interatrial septum was not assessed. Pericardium: There is no evidence of pericardial effusion. Mitral Valve: The mitral valve is abnormal. Mild thickening of the mitral valve leaflet. There is moderate mitral annular calcification present. Mitral valve regurgitation is trivial by color flow Doppler. Tricuspid Valve: The tricuspid valve is normal in structure. Tricuspid valve regurgitation is trivial by color flow Doppler. Aortic Valve: The aortic valve is tricuspid Mild thickening of the aortic valve. Moderate calcification of the aortic valve. Aortic valve regurgitation was not visualized by color flow Doppler. Pulmonic Valve: The pulmonic valve was normal in structure. Pulmonic valve regurgitation is not visualized by color flow Doppler. Venous: The inferior vena cava is normal in size with greater than 50% respiratory variability.  +--------------+--------++ LEFT VENTRICLE         +----------------+---------++ +--------------+--------++ Diastology                PLAX 2D                +----------------+---------++ +--------------+--------++ LV e' lateral:  5.98 cm/s LVIDd:        3.60 cm  +----------------+---------++ +--------------+--------++ LV E/e' lateral:18.6      LVIDs:        2.40 cm  +----------------+---------++ +--------------+--------++ LV e' medial:   4.90 cm/s LV PW:        1.00 cm  +----------------+---------++ +--------------+--------++ LV E/e' medial: 22.7      LV IVS:       1.10 cm  +----------------+---------++ +--------------+--------++ LVOT diam:    2.00 cm  +--------------+--------++ LV SV:        34 ml    +--------------+--------++ LV SV Index:  20.93    +--------------+--------++ LVOT Area:    3.14 cm +--------------+--------++                        +--------------+--------++ +---------------+----------++  RIGHT VENTRICLE            +---------------+----------++ RV S prime:    12.10 cm/s +---------------+----------++ TAPSE (M-mode):2.2 cm     +---------------+----------++ RVSP:          24.2 mmHg  +---------------+----------++ +---------------+-------++-----------++ LEFT ATRIUM           Index       +---------------+-------++-----------++ LA diam:       2.80 cm1.75 cm/m  +---------------+-------++-----------++ LA Vol (A2C):  51.5 ml32.22 ml/m +---------------+-------++-----------++ LA Vol (A4C):  56.7 ml35.48 ml/m +---------------+-------++-----------++ LA Biplane Vol:57.8 ml36.17 ml/m +---------------+-------++-----------++ +------------+---------++-----------++ RIGHT ATRIUM         Index       +------------+---------++-----------++ RA Pressure:3.00 mmHg            +------------+---------++-----------++ RA Area:    11.30 cm            +------------+---------++-----------++ RA Volume:  24.70 ml 15.45 ml/m +------------+---------++-----------++  +------------+-----------++ AORTIC VALVE            +------------+-----------++ LVOT Vmax:  122.00 cm/s +------------+-----------++ LVOT Vmean: 86.500 cm/s +------------+-----------++ LVOT VTI:   0.330 m     +------------+-----------++  +-------------+-------++ AORTA                +-------------+-------++ Ao Root diam:3.00 cm +-------------+-------++ +--------------+-----------++ +---------------+-----------++ MITRAL VALVE              TRICUSPID VALVE            +--------------+-----------++ +---------------+-----------++ MV Area (PHT):2.08 cm    TR Peak grad:  21.2 mmHg   +--------------+-----------++ +---------------+-----------++ MV PHT:       106.00 msec TR Vmax:       230.00 cm/s +--------------+-----------++ +---------------+-----------++ +--------------+-----------++ Estimated RAP: 3.00 mmHg   MV E velocity:111.00 cm/s +---------------+-----------++  +--------------+-----------++ RVSP:          24.2 mmHg   MV A velocity:156.00 cm/s +---------------+-----------++ +--------------+-----------++ MV E/A ratio: 0.71        +--------------+-------+ +--------------+-----------++ SHUNTS                                              +--------------+-------+                               Systemic VTI: 0.33 m                                +--------------+-------+                               Systemic Diam:2.00 cm                               +--------------+-------+  Dorris Carnes MD Electronically signed by Dorris Carnes MD Signature Date/Time: 10/24/2018/1:35:21 PM    Final    VAS US CAROTID (at [facility] and [facility] only)  Result Date: 10/25/2018 Carotid Arterial Duplex Study Indications:  CVA. Risk Factors: Hypertension. Performing Technologist: Abram Sander RVS  Examination Guidelines: A complete evaluation includes B-mode imaging, spectral Doppler, color Doppler, and power Doppler as needed of all accessible portions of each vessel. Bilateral testing is considered an integral part of  a complete examination. Limited examinations for reoccurring indications may be performed as noted.  Right Carotid Findings: +----------+--------+--------+--------+------------+--------+           PSV cm/sEDV cm/sStenosisDescribe    Comments +----------+--------+--------+--------+------------+--------+ CCA Prox  110     19              heterogenous         +----------+--------+--------+--------+------------+--------+ CCA Distal103     19              heterogenous         +----------+--------+--------+--------+------------+--------+ ICA Prox  73      17      1-39%   heterogenous         +----------+--------+--------+--------+------------+--------+ ICA Distal106     24                                   +----------+--------+--------+--------+------------+--------+ ECA       94      6                                     +----------+--------+--------+--------+------------+--------+ +----------+--------+-------+--------+-------------------+           PSV cm/sEDV cmsDescribeArm Pressure (mmHG) +----------+--------+-------+--------+-------------------+ [id number]                                        +----------+--------+-------+--------+-------------------+ +---------+--------+--+--------+--+---------+ VertebralPSV cm/s37EDV cm/s10Antegrade +---------+--------+--+--------+--+---------+  Left Carotid Findings: +----------+--------+--------+--------+------------+--------+           PSV cm/sEDV cm/sStenosisDescribe    Comments +----------+--------+--------+--------+------------+--------+ CCA Prox  132     14              heterogenous         +----------+--------+--------+--------+------------+--------+ CCA Distal100     17              heterogenous         +----------+--------+--------+--------+------------+--------+ ICA Prox  107     25      1-39%   heterogenous         +----------+--------+--------+--------+------------+--------+ ICA Distal82      13                                   +----------+--------+--------+--------+------------+--------+ ECA       80                                           +----------+--------+--------+--------+------------+--------+ +----------+--------+--------+--------+-------------------+ SubclavianPSV cm/sEDV cm/sDescribeArm Pressure (mmHG) +----------+--------+--------+--------+-------------------+           138                                         +----------+--------+--------+--------+-------------------+ +---------+--------+--+--------+--+---------+ VertebralPSV cm/s67EDV cm/s17Antegrade +---------+--------+--+--------+--+---------+  Summary: Right Carotid: Velocities in the right ICA are consistent with a 1-39% stenosis. Left Carotid: Velocities in the left ICA are consistent with a 1-39% stenosis. Vertebrals:  Bilateral vertebral arteries demonstrate antegrade flow. *See table(s) above for measurements and observations.  Electronically signed by Mamie Nick  Sethi MD on 10/25/2018 at 12:23:35 PM.    Final     Assessment & Plan:     Follow-up: No follow-ups on file.  Walker Kehr, MD

## 2020-11-12 NOTE — Assessment & Plan Note (Signed)
New B feet -  Start Gabapentin at hs low dose

## 2020-11-12 NOTE — Patient Instructions (Signed)
Cynthia Garza , Thank you for taking time to come for your Medicare Wellness Visit. I appreciate your ongoing commitment to your health goals. Please review the following plan we discussed and let me know if I can assist you in the future.   Screening recommendations/referrals: Colonoscopy: no repeat due to age Mammogram: 04/13/2020; due every year Bone Density: 03/30/2018; normal results (no longer recommended) Recommended yearly ophthalmology/optometry visit for glaucoma screening and checkup Recommended yearly dental visit for hygiene and checkup  Vaccinations: Influenza vaccine: 05/14/2020; due every year (2022) Pneumococcal vaccine: 05/16/2013, 10/23/2015 (completed) Tdap vaccine: 12/18/2015; due every 10 years (2027) Shingles vaccine: never done   Covid-19: 07/07/2019, 08/08/2019, 02/10/2020  Advanced directives: Please bring a copy of your health care power of attorney and living will to the office at your convenience.  Conditions/risks identified: Continue to stay safe and alive.  Next appointment: Please schedule your next Medicare Wellness Visit with your Nurse Health Advisor in 1 year by calling [phone].   Preventive Care 14 Years and Older, Female Preventive care refers to lifestyle choices and visits with your health care provider that can promote health and wellness. What does preventive care include? A yearly physical exam. This is also called an annual well check. Dental exams once or twice a year. Routine eye exams. Ask your health care provider how often you should have your eyes checked. Personal lifestyle choices, including: Daily care of your teeth and gums. Regular physical activity. Eating a healthy diet. Avoiding tobacco and drug use. Limiting alcohol use. Practicing safe sex. Taking low-dose aspirin every day. Taking vitamin and mineral supplements as recommended by your health care provider. What happens during an annual well check? The services and  screenings done by your health care provider during your annual well check will depend on your age, overall health, lifestyle risk factors, and family history of disease. Counseling  Your health care provider may ask you questions about your: Alcohol use. Tobacco use. Drug use. Emotional well-being. Home and relationship well-being. Sexual activity. Eating habits. History of falls. Memory and ability to understand (cognition). Work and work Statistician. Reproductive health. Screening  You may have the following tests or measurements: Height, weight, and BMI. Blood pressure. Lipid and cholesterol levels. These may be checked every 5 years, or more frequently if you are over 59 years old. Skin check. Lung cancer screening. You may have this screening every year starting at age 59 if you have a 30-pack-year history of smoking and currently smoke or have quit within the past 15 years. Fecal occult blood test (FOBT) of the stool. You may have this test every year starting at age 16. Flexible sigmoidoscopy or colonoscopy. You may have a sigmoidoscopy every 5 years or a colonoscopy every 10 years starting at age 14. Hepatitis C blood test. Hepatitis B blood test. Sexually transmitted disease (STD) testing. Diabetes screening. This is done by checking your blood sugar (glucose) after you have not eaten for a while (fasting). You may have this done every 1-3 years. Bone density scan. This is done to screen for osteoporosis. You may have this done starting at age 75. Mammogram. This may be done every 1-2 years. Talk to your health care provider about how often you should have regular mammograms. Talk with your health care provider about your test results, treatment options, and if necessary, the need for more tests. Vaccines  Your health care provider may recommend certain vaccines, such as: Influenza vaccine. This is recommended every year. Tetanus, diphtheria, and  acellular pertussis (Tdap,  Td) vaccine. You may need a Td booster every 10 years. Zoster vaccine. You may need this after age 40. Pneumococcal 13-valent conjugate (PCV13) vaccine. One dose is recommended after age 23. Pneumococcal polysaccharide (PPSV23) vaccine. One dose is recommended after age 70. Talk to your health care provider about which screenings and vaccines you need and how often you need them. This information is not intended to replace advice given to you by your health care provider. Make sure you discuss any questions you have with your health care provider. Document Released: 06/08/2015 Document Revised: 01/30/2016 Document Reviewed: 03/13/2015 Elsevier Interactive Patient Education  2017 [place] Prevention in the Home Falls can cause injuries. They can happen to people of all ages. There are many things you can do to make your home safe and to help prevent falls. What can I do on the outside of my home? Regularly fix the edges of walkways and driveways and fix any cracks. Remove anything that might make you trip as you walk through a door, such as a raised step or threshold. Trim any bushes or trees on the path to your home. Use bright outdoor lighting. Clear any walking paths of anything that might make someone trip, such as rocks or tools. Regularly check to see if handrails are loose or broken. Make sure that both sides of any steps have handrails. Any raised decks and porches should have guardrails on the edges. Have any leaves, snow, or ice cleared regularly. Use sand or salt on walking paths during winter. Clean up any spills in your garage right away. This includes oil or grease spills. What can I do in the bathroom? Use night lights. Install grab bars by the toilet and in the tub and shower. Do not use towel bars as grab bars. Use non-skid mats or decals in the tub or shower. If you need to sit down in the shower, use a plastic, non-slip stool. Keep the floor dry. Clean up any  water that spills on the floor as soon as it happens. Remove soap buildup in the tub or shower regularly. Attach bath mats securely with double-sided non-slip rug tape. Do not have throw rugs and other things on the floor that can make you trip. What can I do in the bedroom? Use night lights. Make sure that you have a light by your bed that is easy to reach. Do not use any sheets or blankets that are too big for your bed. They should not hang down onto the floor. Have a firm chair that has side arms. You can use this for support while you get dressed. Do not have throw rugs and other things on the floor that can make you trip. What can I do in the kitchen? Clean up any spills right away. Avoid walking on wet floors. Keep items that you use a lot in easy-to-reach places. If you need to reach something above you, use a strong step stool that has a grab bar. Keep electrical cords out of the way. Do not use floor polish or wax that makes floors slippery. If you must use wax, use non-skid floor wax. Do not have throw rugs and other things on the floor that can make you trip. What can I do with my stairs? Do not leave any items on the stairs. Make sure that there are handrails on both sides of the stairs and use them. Fix handrails that are broken or loose. Make sure that  handrails are as long as the stairways. Check any carpeting to make sure that it is firmly attached to the stairs. Fix any carpet that is loose or worn. Avoid having throw rugs at the top or bottom of the stairs. If you do have throw rugs, attach them to the floor with carpet tape. Make sure that you have a light switch at the top of the stairs and the bottom of the stairs. If you do not have them, ask someone to add them for you. What else can I do to help prevent falls? Wear shoes that: Do not have high heels. Have rubber bottoms. Are comfortable and fit you well. Are closed at the toe. Do not wear sandals. If you use a  stepladder: Make sure that it is fully opened. Do not climb a closed stepladder. Make sure that both sides of the stepladder are locked into place. Ask someone to hold it for you, if possible. Clearly mark and make sure that you can see: Any grab bars or handrails. First and last steps. Where the edge of each step is. Use tools that help you move around (mobility aids) if they are needed. These include: Canes. Walkers. Scooters. Crutches. Turn on the lights when you go into a dark area. Replace any light bulbs as soon as they burn out. Set up your furniture so you have a clear path. Avoid moving your furniture around. If any of your floors are uneven, fix them. If there are any pets around you, be aware of where they are. Review your medicines with your doctor. Some medicines can make you feel dizzy. This can increase your chance of falling. Ask your doctor what other things that you can do to help prevent falls. This information is not intended to replace advice given to you by your health care provider. Make sure you discuss any questions you have with your health care provider. Document Released: 03/08/2009 Document Revised: 10/18/2015 Document Reviewed: 06/16/2014 Elsevier Interactive Patient Education  2017 [employer].

## 2020-11-12 NOTE — Progress Notes (Addendum)
Subjective:   Cynthia Garza is a 87 y.o. female who presents for Medicare Annual (Subsequent) preventive examination.  Review of Systems     Cardiac Risk Factors include: advanced age (>23mn, >>34women);dyslipidemia;hypertension     Objective:    Today's Vitals   11/12/20 1544  BP: 116/70  Pulse: 80  Temp: 98.2 F (36.8 C)  TempSrc: Temporal  SpO2: 96%  Weight: 133 lb 9.6 oz (60.6 kg)  Height: _0  (1.549 m)  PainSc: 0-No pain   Body mass index is 25.24 kg/m.  Advanced Directives 11/12/2020 10/12/2019 10/24/2018 01/14/2017 07/17/2015 07/18/2014 06/30/2013  Does Patient Have a Medical Advance Directive? Yes Yes Yes No Yes Yes Patient has advance directive, copy not in chart  Type of Advance [place]ive - - [place] - - - [place]  Does patient want to make changes to medical advance directive? No - Patient declined No - Patient declined No - Patient declined - - - No  Copy of Healthcare Power of Attorney in Chart? - - No - copy requested - - - Copy requested from family  Pre-existing out of facility DNR order (yellow form or pink MOST form) - - - - - - No    Current Medications (verified) Outpatient Encounter Medications as of 11/12/2020  Medication Sig   amLODipine (NORVASC) 5 MG tablet TAKE 1 TABLET BY MOUTH EVERY DAY   BIOTIN PO Take by mouth.   buPROPion (WELLBUTRIN XL) 150 MG 24 hr tablet TAKE 1 TABLET BY MOUTH EVERY DAY   Cholecalciferol (VITAMIN D3) 50 MCG (2000 UT) capsule Take 1 capsule (2,000 Units total) by mouth daily.   clopidogrel (PLAVIX) 75 MG tablet TAKE 1 TABLET BY MOUTH EVERY DAY   ezetimibe (ZETIA) 10 MG tablet Take 1 tablet (10 mg total) by mouth daily.   Famotidine (PEPCID PO) Take by mouth.   meclizine (ANTIVERT) 12.5 MG tablet Take 1 tablet (12.5 mg total) by mouth 2 (two) times daily as needed for dizziness.   Multiple Vitamins-Minerals (ALIVE ONCE DAILY WOMENS 50+) TABS Take 1 tablet  by mouth daily.   Multiple Vitamins-Minerals (PRESERVISION AREDS 2 PO) Take 1 tablet by mouth 2 (two) times daily.   olmesartan-hydrochlorothiazide (BENICAR HCT) 40-25 MG tablet TAKE 1 TABLET BY MOUTH EVERY DAY   ondansetron (ZOFRAN) 4 MG tablet Take 1 tablet (4 mg total) by mouth every 8 (eight) hours as needed for nausea or vomiting.   Propylene Glycol 0.6 % SOLN Place 2 drops into both eyes at bedtime.   vitamin B-12 (CYANOCOBALAMIN) 1000 MCG tablet Take 1,000 mcg by mouth daily.   zolpidem (AMBIEN) 10 MG tablet Take 0.5-1 tablets (5-10 mg total) by mouth at bedtime as needed for sleep.   albuterol (PROVENTIL HFA) 108 (90 Base) MCG/ACT inhaler Inhale 2 puffs into the lungs every 4 (four) hours as needed for wheezing or shortness of breath.   [DISCONTINUED] Coenzyme Q10 (CO Q-10 PO) Take by mouth. (Patient not taking: No sig reported)   [DISCONTINUED] methylPREDNISolone (MEDROL DOSEPAK) 4 MG TBPK tablet As directed (Patient not taking: No sig reported)   No facility-administered encounter medications on file as of 11/12/2020.    Allergies (verified) Cholestoff [plant sterols and stanols], Lexapro [escitalopram oxalate], Simvastatin, and Diflucan [fluconazole]   History: Past Medical History:  Diagnosis Date   Anxiety    [place]   ER+/PR+/Her2-   Depression    Dyslipidemia    GERD (gastroesophageal reflux disease)  Heart murmur    Hemangioma of intra-abdominal structures    HTN (hypertension)    Insomnia    LBP (low back pain)    Osteoporosis    Paresthesia    Radiation 08/08/13-08/29/13   Bilat.breast 42.72 Gy   Renal cyst    Shingles    Past Surgical History:  Procedure Laterality Date   ABDOMINAL HYSTERECTOMY     ?1970's   BREAST LUMPECTOMY WITH NEEDLE LOCALIZATION AND AXILLARY SENTINEL LYMPH NODE BX Bilateral 07/07/2013   Procedure: BREAST LUMPECTOMY WITH NEEDLE LOCALIZATION AND AXILLARY SENTINEL LYMPH NODE BIOPIES;  Surgeon: Merrie Roof, MD;   Location: [facility];  Service: General;  Laterality: Bilateral;   Family History  Problem Relation Age of Onset   Kidney failure Mother    Hypertension Father    Esophageal cancer Father    Coronary artery disease Neg Hx    Social History   Socioeconomic History   Marital status: Widowed    Spouse name: Not on file   Number of children: 2   Years of education: Not on file   Highest education level: Not on file  Occupational History   Occupation: Retired    Fish farm manager: RETIRED    Comment: [employer]  Tobacco Use   Smoking status: Never   Smokeless tobacco: Never  Vaping Use   Vaping Use: Never used  Substance and Sexual Activity   Alcohol use: No   Drug use: No   Sexual activity: Yes    Comment: menarche age 88, first live birth [age] yrs of age, P95, menopause 70, no HRT  Other Topics Concern   Not on file  Social History Narrative   Not on file   Social Determinants of Health   Financial Resource Strain: Low Risk    Difficulty of Paying Living Expenses: Not hard at all  Food Insecurity: No Food Insecurity   Worried About Charity fundraiser in the Last Year: Never true   [place] in the Last Year: Never true  Transportation Needs: No Transportation Needs   Lack of Transportation (Medical): No   Lack of Transportation (Non-Medical): No  Physical Activity: Sufficiently Active   Days of Exercise per Week: 5 days   Minutes of Exercise per Session: 30 min  Stress: No Stress Concern Present   Feeling of Stress : Not at all  Social Connections: Moderately Integrated   Frequency of Communication with Friends and Family: More than three times a week   Frequency of Social Gatherings with Friends and Family: More than three times a week   Attends Religious Services: More than 4 times per year   Active Member of [employer] or Organizations: Yes   Attends Archivist Meetings: More than 4 times per year   Marital Status: Widowed    Tobacco Counseling Counseling  given: Not Answered   Clinical Intake:  Pre-visit preparation completed: Yes  Pain : No/denies pain Pain Score: 0-No pain     BMI - recorded: 25.24 Nutritional Status: BMI 25 -29 Overweight Nutritional Risks: None Diabetes: No  How often do you need to have someone help you when you read instructions, pamphlets, or other written materials from your doctor or pharmacy?: 1 - Never What is the last grade level you completed in school?: High School Graduate  Diabetic? no  Interpreter Needed?: No  Information entered by :: Lisette Abu, LPN   Activities of Daily Living In your present state of health, do you have any  difficulty performing the following activities: 11/12/2020  Hearing? N  Vision? N  Difficulty concentrating or making decisions? N  Walking or climbing stairs? N  Dressing or bathing? N  Doing errands, shopping? N  Preparing Food and eating ? N  Using the Toilet? N  In the past six months, have you accidently leaked urine? N  Do you have problems with loss of bowel control? N  Managing your Medications? N  Managing your Finances? N  Housekeeping or managing your Housekeeping? N  Some recent data might be hidden    Patient Care Team: Plotnikov, Evie Lacks, MD as PCP - General (Internal Medicine) Magrinat, Virgie Dad, MD as Consulting Physician (Oncology) Jovita Kussmaul, MD as Consulting Physician (General Surgery) Thea Silversmith, MD as Referring Physician (Radiation Oncology) Juluis Rainier as Consulting Physician (Optometry)  Indicate any recent Medical Services you may have received from other than [facility] providers in the past year (date may be approximate).     Assessment:   This is a routine wellness examination for Cynthia Garza.  Hearing/Vision screen Hearing Screening - Comments:: Patient declined any hearing difficulties. Vision Screening - Comments:: Patient wears glasses and last eye exam was done by Dr. Alois Cliche.  Dietary issues and exercise  activities discussed: Current Exercise Habits: Home exercise routine, Type of exercise: walking, Time (Minutes): 30, Frequency (Times/Week): 5, Weekly Exercise (Minutes/Week): 150, Intensity: Moderate, Exercise limited by: cardiac condition(s);neurologic condition(s)   Goals Addressed             This Visit's Progress    Patient Stated       To stay safe and alive.       Depression Screen PHQ 2/9 Scores 11/12/2020 01/16/2020 10/12/2019 12/29/2017 12/02/2016 09/01/2016 10/23/2015  PHQ - 2 Score 1 0 0 0 0 - 0  PHQ- 9 Score - - - - 3 - -  Exception Documentation - - - - - Medical reason -    Fall Risk Fall Risk  11/12/2020 11/12/2020 10/12/2019 02/28/2019 11/23/2018  Falls in the past year? 0 0 0 0 0  Number falls in past yr: 0 0 0 - -  Injury with Fall? 0 0 0 - -  Risk for fall due to : No Fall Risks No Fall Risks Orthopedic patient - -  Follow up Falls evaluation completed - Falls evaluation completed;Education provided;Falls prevention discussed - -    FALL RISK PREVENTION PERTAINING TO THE HOME:  Any stairs in or around the home? No  If so, are there any without handrails? No  Home free of loose throw rugs in walkways, pet beds, electrical cords, etc? Yes  Adequate lighting in your home to reduce risk of falls? Yes   ASSISTIVE DEVICES UTILIZED TO PREVENT FALLS:  Life alert? No  Use of a cane, walker or w/c? No  Grab bars in the bathroom? Yes  Shower chair or bench in shower? Yes  Elevated toilet seat or a handicapped toilet? Yes   TIMED UP AND GO:  Was the test performed? Yes .  Length of time to ambulate 10 feet: 6 sec.   Gait steady and fast without use of assistive device  Cognitive Function: Normal cognitive status assessed by direct observation by this Nurse Health Advisor. No abnormalities found.          Immunizations Immunization History  Administered Date(s) Administered   Fluad Quad(high Dose 65+) 04/13/2019, 05/14/2020   Influenza Split 03/26/2012,  04/11/2015   Influenza Whole 03/07/2008, 04/26/2009, 05/13/2010  Influenza, High Dose Seasonal PF 07/21/2016, 03/13/2017, 04/07/2018   Influenza,inj,Quad PF,6+ Mos 02/14/2013, 01/17/2014   Influenza-Unspecified 02/24/2015   Moderna SARS-COV2 Booster Vaccination 02/10/2020   Moderna Sars-Covid-2 Vaccination 07/07/2019, 08/08/2019   Pneumococcal Conjugate-13 05/16/2013   Pneumococcal Polysaccharide-23 02/10/2007, 10/23/2015   Td 12/13/1998   Tdap 12/18/2015   Zoster, Live 07/11/2008    TDAP status: Up to date  Flu Vaccine status: Up to date  Pneumococcal vaccine status: Up to date  Covid-19 vaccine status: Information provided on how to obtain vaccines.   Qualifies for Shingles Vaccine? Yes   Zostavax completed Yes   Shingrix Completed?: No.    Education has been provided regarding the importance of this vaccine. Patient has been advised to call insurance company to determine out of pocket expense if they have not yet received this vaccine. Advised may also receive vaccine at local pharmacy or Health Dept. Verbalized acceptance and understanding.  Screening Tests Health Maintenance  Topic Date Due   Zoster Vaccines- Shingrix (1 of 2) Never done   COVID-19 Vaccine (4 - Booster for Moderna series) 05/11/2020   INFLUENZA VACCINE  12/24/2020   TETANUS/TDAP  12/17/2025   DEXA SCAN  Completed   PNA vac Low Risk Adult  Completed   HPV VACCINES  Aged Out    Health Maintenance  Health Maintenance Due  Topic Date Due   Zoster Vaccines- Shingrix (1 of 2) Never done   COVID-19 Vaccine (4 - Booster for Moderna series) 05/11/2020    Colorectal cancer screening: No longer required.   Mammogram status: Completed 04/13/2020. Repeat every year  Bone Density status: Completed 03/30/2018. Results reflect: Bone density results: NORMAL. Repeat every n/a years. (Discontinued)  Lung Cancer Screening: (Low Dose CT Chest recommended if Age 77-80 years, 30 pack-year currently smoking OR have  quit w/in 15years.) does not qualify.   Lung Cancer Screening Referral: no  Additional Screening:  Hepatitis C Screening: does not qualify; Completed no  Vision Screening: Recommended annual ophthalmology exams for early detection of glaucoma and other disorders of the eye. Is the patient up to date with their annual eye exam?  Yes  Who is the provider or what is the name of the office in which the patient attends annual eye exams? Alois Cliche, OD. If pt is not established with a provider, would they like to be referred to a provider to establish care? No .   Dental Screening: Recommended annual dental exams for proper oral hygiene  Community Resource Referral / Chronic Care Management: CRR required this visit?  No   CCM required this visit?  No      Plan:     I have personally reviewed and noted the following in the patient's chart:   Medical and social history Use of alcohol, tobacco or illicit drugs  Current medications and supplements including opioid prescriptions.  Functional ability and status Nutritional status Physical activity Advanced directives List of other physicians Hospitalizations, surgeries, and ER visits in previous 12 months Vitals Screenings to include cognitive, depression, and falls Referrals and appointments  In addition, I have reviewed and discussed with patient certain preventive protocols, quality metrics, and best practice recommendations. A written personalized care plan for preventive services as well as general preventive health recommendations were provided to patient.     Sheral Flow, LPN   0/16/0109   Nurse Notes: n/a  Medical screening examination/treatment/procedure(s) were performed by non-physician practitioner and as supervising physician I was immediately available for consultation/collaboration.  I agree with  above. Lew Dawes, MD

## 2020-11-13 DIAGNOSIS — Z8673 Personal history of transient ischemic attack (TIA), and cerebral infarction without residual deficits: Secondary | ICD-10-CM | POA: Insufficient documentation

## 2020-11-13 LAB — COMPREHENSIVE METABOLIC PANEL
ALT: 7 U/L (ref 0–35)
AST: 14 U/L (ref 0–37)
Albumin: 4.4 g/dL (ref 3.5–5.2)
Alkaline Phosphatase: 57 U/L (ref 39–117)
BUN: 21 mg/dL (ref 6–23)
CO2: 28 mEq/L (ref 19–32)
Calcium: 9.5 mg/dL (ref 8.4–10.5)
Chloride: 103 mEq/L (ref 96–112)
Creatinine, Ser: 0.97 mg/dL (ref 0.40–1.20)
GFR: 53.18 mL/min — ABNORMAL LOW (ref >60.00)
Glucose, Bld: 89 mg/dL (ref 70–99)
Potassium: 4 mEq/L (ref 3.5–5.1)
Sodium: 141 mEq/L (ref 135–145)
Total Bilirubin: 0.6 mg/dL (ref 0.2–1.2)
Total Protein: 7 g/dL (ref 6.0–8.3)

## 2020-11-13 LAB — TSH: TSH: 1.29 u[IU]/mL (ref 0.35–4.50)

## 2020-11-13 NOTE — Assessment & Plan Note (Signed)
No relapse.  Continue with Plavix, Zetia, Benicar HCT, amlodipine

## 2020-11-13 NOTE — Assessment & Plan Note (Signed)
Continue with Plavix 

## 2020-12-17 ENCOUNTER — Other Ambulatory Visit: Payer: Self-pay | Admitting: Internal Medicine

## 2020-12-22 ENCOUNTER — Other Ambulatory Visit: Payer: Self-pay | Admitting: Internal Medicine

## 2021-01-10 ENCOUNTER — Other Ambulatory Visit: Payer: Self-pay | Admitting: Internal Medicine

## 2021-02-12 ENCOUNTER — Other Ambulatory Visit: Payer: Self-pay

## 2021-02-12 ENCOUNTER — Encounter: Payer: Self-pay | Admitting: Internal Medicine

## 2021-02-12 ENCOUNTER — Ambulatory Visit (INDEPENDENT_AMBULATORY_CARE_PROVIDER_SITE_OTHER): Payer: Medicare Other | Admitting: Internal Medicine

## 2021-02-12 VITALS — BP 132/62 | HR 77 | Temp 97.4°F | Ht 61.0 in | Wt 136.2 lb

## 2021-02-12 DIAGNOSIS — R252 Cramp and spasm: Secondary | ICD-10-CM | POA: Diagnosis not present

## 2021-02-12 DIAGNOSIS — I69359 Hemiplegia and hemiparesis following cerebral infarction affecting unspecified side: Secondary | ICD-10-CM

## 2021-02-12 DIAGNOSIS — F4321 Adjustment disorder with depressed mood: Secondary | ICD-10-CM | POA: Diagnosis not present

## 2021-02-12 DIAGNOSIS — Z23 Encounter for immunization: Secondary | ICD-10-CM | POA: Diagnosis not present

## 2021-02-12 DIAGNOSIS — R413 Other amnesia: Secondary | ICD-10-CM | POA: Diagnosis not present

## 2021-02-12 LAB — COMPREHENSIVE METABOLIC PANEL
ALT: 8 U/L (ref 0–35)
AST: 16 U/L (ref 0–37)
Albumin: 4.2 g/dL (ref 3.5–5.2)
Alkaline Phosphatase: 57 U/L (ref 39–117)
BUN: 27 mg/dL — ABNORMAL HIGH (ref 6–23)
CO2: 29 mEq/L (ref 19–32)
Calcium: 9.6 mg/dL (ref 8.4–10.5)
Chloride: 104 mEq/L (ref 96–112)
Creatinine, Ser: 1.11 mg/dL (ref 0.40–1.20)
GFR: 45.15 mL/min — ABNORMAL LOW (ref >60.00)
Glucose, Bld: 102 mg/dL — ABNORMAL HIGH (ref 70–99)
Potassium: 3.8 mEq/L (ref 3.5–5.1)
Sodium: 141 mEq/L (ref 135–145)
Total Bilirubin: 0.5 mg/dL (ref 0.2–1.2)
Total Protein: 6.9 g/dL (ref 6.0–8.3)

## 2021-02-12 LAB — MAGNESIUM: Magnesium: 1.8 mg/dL (ref 1.5–2.5)

## 2021-02-12 MED ORDER — OLMESARTAN MEDOXOMIL 40 MG PO TABS: 40.0000 mg | ORAL_TABLET | Freq: Every day | ORAL | 3 refills | Status: DC

## 2021-02-12 NOTE — Assessment & Plan Note (Signed)
Doing better.   

## 2021-02-12 NOTE — Addendum Note (Signed)
Addended by: Boris Lown B on: 02/12/2021 10:11 AM   Modules accepted: Orders

## 2021-02-12 NOTE — Progress Notes (Signed)
Subjective:  Patient ID: Cynthia Garza, female    DOB: 1933-03-09  Age: 87 y.o. MRN: [id number]  CC: Follow-up (3 month f/u- Flu shot)   HPI Cynthia Garza presents for neck pain, anxiety, chronic leg cramps, HTN C/o leg cramps Pt never took Gabapentin for LE neuropathy   Outpatient Medications Prior to Visit  Medication Sig Dispense Refill   amLODipine (NORVASC) 5 MG tablet TAKE 1 TABLET BY MOUTH EVERY DAY 90 tablet 1   BIOTIN PO Take by mouth.     buPROPion (WELLBUTRIN XL) 150 MG 24 hr tablet TAKE 1 TABLET BY MOUTH DAILY 90 tablet 3   Cholecalciferol (VITAMIN D3) 50 MCG (2000 UT) capsule Take 1 capsule (2,000 Units total) by mouth daily. 100 capsule 3   clopidogrel (PLAVIX) 75 MG tablet TAKE 1 TABLET BY MOUTH EVERY DAY 90 tablet 1   ezetimibe (ZETIA) 10 MG tablet Take 1 tablet (10 mg total) by mouth daily. 90 tablet 3   Famotidine (PEPCID PO) Take by mouth.     meclizine (ANTIVERT) 12.5 MG tablet Take 1 tablet (12.5 mg total) by mouth 2 (two) times daily as needed for dizziness. 60 tablet 2   Multiple Vitamins-Minerals (ALIVE ONCE DAILY WOMENS 50+) TABS Take 1 tablet by mouth daily.     Multiple Vitamins-Minerals (PRESERVISION AREDS 2 PO) Take 1 tablet by mouth 2 (two) times daily.     ondansetron (ZOFRAN) 4 MG tablet Take 1 tablet (4 mg total) by mouth every 8 (eight) hours as needed for nausea or vomiting. 15 tablet 0   Propylene Glycol 0.6 % SOLN Place 2 drops into both eyes at bedtime.     vitamin B-12 (CYANOCOBALAMIN) 1000 MCG tablet Take 1,000 mcg by mouth daily.     zolpidem (AMBIEN) 10 MG tablet Take 0.5-1 tablets (5-10 mg total) by mouth at bedtime as needed for sleep. 90 tablet 1   olmesartan-hydrochlorothiazide (BENICAR HCT) 40-25 MG tablet TAKE 1 TABLET BY MOUTH DAILY 90 tablet 3   albuterol (PROVENTIL HFA) 108 (90 Base) MCG/ACT inhaler Inhale 2 puffs into the lungs every 4 (four) hours as needed for wheezing or shortness of breath. 8.5 g 3   gabapentin  (NEURONTIN) 100 MG capsule Take 1 capsule (100 mg total) by mouth at bedtime. (Patient not taking: Reported on 02/12/2021) 30 capsule 5   No facility-administered medications prior to visit.    ROS: Review of Systems  Constitutional:  Negative for activity change, appetite change, chills, fatigue and unexpected weight change.  HENT:  Negative for congestion, mouth sores and sinus pressure.   Eyes:  Negative for visual disturbance.  Respiratory:  Negative for cough and chest tightness.   Gastrointestinal:  Negative for abdominal pain and nausea.  Genitourinary:  Negative for difficulty urinating, frequency and vaginal pain.  Musculoskeletal:  Positive for arthralgias and neck stiffness. Negative for back pain and gait problem.  Skin:  Negative for pallor and rash.  Neurological:  Negative for dizziness, tremors, weakness, numbness and headaches.  Psychiatric/Behavioral:  Negative for confusion, sleep disturbance and suicidal ideas. The patient is nervous/anxious.    Objective:  BP 132/62 (BP Location: Left Arm)   Pulse 77   Temp (!) 97.4 F (36.3 C) (Oral)   Ht 5\' 1"  (1.549 m)   Wt 136 lb 3.2 oz (61.8 kg)   SpO2 97%   BMI 25.73 kg/m   BP Readings from Last 3 Encounters:  02/12/21 132/62  11/12/20 116/70  11/12/20 116/70    Wt  Readings from Last 3 Encounters:  02/12/21 136 lb 3.2 oz (61.8 kg)  11/12/20 133 lb 9.6 oz (60.6 kg)  11/12/20 133 lb 9.6 oz (60.6 kg)    Physical Exam Constitutional:      General: She is not in acute distress.    Appearance: Normal appearance. She is well-developed.  HENT:     Head: Normocephalic.     Right Ear: External ear normal.     Left Ear: External ear normal.     Nose: Nose normal.  Eyes:     General:        Right eye: No discharge.        Left eye: No discharge.     Conjunctiva/sclera: Conjunctivae normal.     Pupils: Pupils are equal, round, and reactive to light.  Neck:     Thyroid: No thyromegaly.     Vascular: No JVD.      Trachea: No tracheal deviation.  Cardiovascular:     Rate and Rhythm: Normal rate and regular rhythm.     Heart sounds: Normal heart sounds.  Pulmonary:     Effort: No respiratory distress.     Breath sounds: No stridor. No wheezing.  Abdominal:     General: Bowel sounds are normal. There is no distension.     Palpations: Abdomen is soft. There is no mass.     Tenderness: There is no abdominal tenderness. There is no guarding or rebound.  Musculoskeletal:        General: Tenderness present.     Cervical back: Normal range of motion and neck supple. No rigidity.  Lymphadenopathy:     Cervical: No cervical adenopathy.  Skin:    Findings: No erythema or rash.  Neurological:     Mental Status: She is oriented to person, place, and time.     Cranial Nerves: No cranial nerve deficit.     Motor: No abnormal muscle tone.     Coordination: Coordination normal.     Gait: Gait normal.     Deep Tendon Reflexes: Reflexes normal.  Psychiatric:        Behavior: Behavior normal.        Thought Content: Thought content normal.        Judgment: Judgment normal.    Lab Results  Component Value Date   WBC 6.6 04/13/2020   HGB 13.6 04/13/2020   HCT 40.3 04/13/2020   PLT 226.0 04/13/2020   GLUCOSE 89 11/12/2020   CHOL 207 (H) 04/13/2020   TRIG 148.0 04/13/2020   HDL 60.10 04/13/2020   LDLDIRECT 96.0 04/13/2019   LDLCALC 117 (H) 04/13/2020   ALT 7 11/12/2020   AST 14 11/12/2020   NA 141 11/12/2020   K 4.0 11/12/2020   CL 103 11/12/2020   CREATININE 0.97 11/12/2020   BUN 21 11/12/2020   CO2 28 11/12/2020   TSH 1.29 11/12/2020   INR 1.0 10/24/2018   HGBA1C 5.6 10/24/2018    CT Head Wo Contrast  Result Date: 10/24/2018 CLINICAL DATA:  Right upper extremity heaviness and lower extremity numbness EXAM: CT HEAD WITHOUT CONTRAST TECHNIQUE: Contiguous axial images were obtained from the base of the skull through the vertex without intravenous contrast. COMPARISON:  None. FINDINGS: Brain:  No acute territorial infarction, hemorrhage, or intracranial mass. The ventricles are nonenlarged. Vascular: No hyperdense vessels.  Carotid vascular calcification. Skull: Normal. Negative for fracture or focal lesion. Sinuses/Orbits: No acute finding. Other: Chronic appearing abnormality at the anterior arch of C1, incompletely visualized. Prominent  degenerative changes at the C1-C2 articulation. IMPRESSION: Negative non contrasted CT appearance of the brain.  Mild atrophy Electronically Signed   By: Donavan Foil M.D.   On: 10/24/2018 01:00   MR BRAIN WO CONTRAST  Result Date: 10/24/2018 CLINICAL DATA:  Initial evaluation for new onset right-sided arm and leg weakness. EXAM: MRI HEAD WITHOUT CONTRAST MRA HEAD WITHOUT CONTRAST TECHNIQUE: Multiplanar, multiecho pulse sequences of the brain and surrounding structures were obtained without intravenous contrast. Angiographic images of the head were obtained using MRA technique without contrast. COMPARISON:  Prior CT from earlier the same day. FINDINGS: MRI HEAD FINDINGS Brain: Cerebral volume within normal limits for age. Minimal T2/FLAIR hyperintensity noted within the periventricular white matter, nonspecific, and felt to be within normal limits for age. 7 mm focus of diffusion abnormality seen at the mid-posterior left centrum semi ovale (series 5, image 80). No associated hemorrhage. No other evidence for acute or subacute ischemia. Gray-white matter differentiation otherwise maintained. No other areas of remote cortical infarction. No foci of susceptibility artifact to suggest acute or chronic intracranial hemorrhage. No mass lesion, midline shift or mass effect. No hydrocephalus. No extra-axial fluid collection. Pituitary gland suprasellar region normal. Midline structures intact. Vascular: Major intracranial vascular flow voids are maintained. Skull and upper cervical spine: Craniocervical junction normal. Bone marrow signal intensity within normal limits.  Hyperostosis frontalis interna noted. Scalp soft tissues unremarkable. Sinuses/Orbits: Patient status post bilateral ocular lens replacement. Globes orbital soft tissues demonstrate no acute finding. Paranasal sinuses are clear. No mastoid effusion. Inner ear structures normal. Other: None. MRA HEAD FINDINGS ANTERIOR CIRCULATION: Distal cervical segments of the internal carotid arteries are patent with symmetric antegrade flow. Petrous segments widely patent bilaterally. Scattered atheromatous irregularity within the cavernous/supraclinoid ICAs. No significant stenosis seen on the right. There is a short-segment severe stenosis at the anterior genu of the cavernous left ICA (series 9, image 104). ICA termini well perfused. A1 segments patent bilaterally. Normal anterior communicating artery. Anterior cerebral arteries widely patent to their distal aspects. M1 segments widely patent bilaterally. Normal MCA bifurcations. Distal MCA branches well perfused and symmetric. Mild distal small vessel atheromatous irregularity. POSTERIOR CIRCULATION: Vertebral arteries patent to the vertebrobasilar junction without stenosis. Left vertebral artery dominant. Posterior inferior cerebral arteries patent bilaterally. Basilar patent to its distal aspect without stenosis. Superior cerebral arteries patent bilaterally. Both of the posterior cerebral arteries primarily supplied via the basilar and are well perfused to their distal aspects. No intracranial aneurysm. IMPRESSION: MRI HEAD IMPRESSION: 1. 7 mm acute ischemic nonhemorrhagic infarct involving the deep white matter of the mid-posterior left centrum semi ovale. 2. Otherwise normal brain MRI for age. MRA HEAD IMPRESSION: 1. Negative intracranial MRA for large vessel occlusion. 2. Short-segment severe cavernous left ICA stenosis. 3. Otherwise negative intracranial MRA. No other hemodynamically significant or correctable stenosis. Electronically Signed   By: Jeannine Boga  M.D.   On: 10/24/2018 06:59   DG Chest Portable 1 View  Result Date: 10/24/2018 CLINICAL DATA:  TIA EXAM: PORTABLE CHEST 1 VIEW COMPARISON:  08/14/2017 FINDINGS: The heart size and mediastinal contours are within normal limits. Both lungs are clear. Surgical clips over the axilla bilaterally. Aortic atherosclerosis. IMPRESSION: No active disease. Electronically Signed   By: Donavan Foil M.D.   On: 10/24/2018 00:43   MR MRA HEAD WO CONTRAST  Result Date: 10/24/2018 CLINICAL DATA:  Initial evaluation for new onset right-sided arm and leg weakness. EXAM: MRI HEAD WITHOUT CONTRAST MRA HEAD WITHOUT CONTRAST TECHNIQUE: Multiplanar, multiecho pulse sequences  of the brain and surrounding structures were obtained without intravenous contrast. Angiographic images of the head were obtained using MRA technique without contrast. COMPARISON:  Prior CT from earlier the same day. FINDINGS: MRI HEAD FINDINGS Brain: Cerebral volume within normal limits for age. Minimal T2/FLAIR hyperintensity noted within the periventricular white matter, nonspecific, and felt to be within normal limits for age. 7 mm focus of diffusion abnormality seen at the mid-posterior left centrum semi ovale (series 5, image 80). No associated hemorrhage. No other evidence for acute or subacute ischemia. Gray-white matter differentiation otherwise maintained. No other areas of remote cortical infarction. No foci of susceptibility artifact to suggest acute or chronic intracranial hemorrhage. No mass lesion, midline shift or mass effect. No hydrocephalus. No extra-axial fluid collection. Pituitary gland suprasellar region normal. Midline structures intact. Vascular: Major intracranial vascular flow voids are maintained. Skull and upper cervical spine: Craniocervical junction normal. Bone marrow signal intensity within normal limits. Hyperostosis frontalis interna noted. Scalp soft tissues unremarkable. Sinuses/Orbits: Patient status post bilateral ocular  lens replacement. Globes orbital soft tissues demonstrate no acute finding. Paranasal sinuses are clear. No mastoid effusion. Inner ear structures normal. Other: None. MRA HEAD FINDINGS ANTERIOR CIRCULATION: Distal cervical segments of the internal carotid arteries are patent with symmetric antegrade flow. Petrous segments widely patent bilaterally. Scattered atheromatous irregularity within the cavernous/supraclinoid ICAs. No significant stenosis seen on the right. There is a short-segment severe stenosis at the anterior genu of the cavernous left ICA (series 9, image 104). ICA termini well perfused. A1 segments patent bilaterally. Normal anterior communicating artery. Anterior cerebral arteries widely patent to their distal aspects. M1 segments widely patent bilaterally. Normal MCA bifurcations. Distal MCA branches well perfused and symmetric. Mild distal small vessel atheromatous irregularity. POSTERIOR CIRCULATION: Vertebral arteries patent to the vertebrobasilar junction without stenosis. Left vertebral artery dominant. Posterior inferior cerebral arteries patent bilaterally. Basilar patent to its distal aspect without stenosis. Superior cerebral arteries patent bilaterally. Both of the posterior cerebral arteries primarily supplied via the basilar and are well perfused to their distal aspects. No intracranial aneurysm. IMPRESSION: MRI HEAD IMPRESSION: 1. 7 mm acute ischemic nonhemorrhagic infarct involving the deep white matter of the mid-posterior left centrum semi ovale. 2. Otherwise normal brain MRI for age. MRA HEAD IMPRESSION: 1. Negative intracranial MRA for large vessel occlusion. 2. Short-segment severe cavernous left ICA stenosis. 3. Otherwise negative intracranial MRA. No other hemodynamically significant or correctable stenosis. Electronically Signed   By: Jeannine Boga M.D.   On: 10/24/2018 06:59   ECHOCARDIOGRAM COMPLETE  Result Date: 10/24/2018   ECHOCARDIOGRAM REPORT   Patient Name:    Cynthia Garza Date of Exam: 10/24/2018 Medical Rec #:  [id number]           Height:       61.0 in Accession #:    [id number]          Weight:       135.0 lb Date of Birth:  April 15, 1933            BSA:          1.60 m Patient Age:    56 years            BP:           118/57 mmHg Patient Gender: F                   HR:           75 bpm. Exam Location:  Inpatient  Procedure: 2D Echo Indications:    Stroke 434.91  History:        Patient has no prior history of Echocardiogram examinations.                 Signs/Symptoms: Murmur Risk Factors: Hypertension and                 Dyslipidemia.  Sonographer:    Mikki Santee RDCS (AE) Referring Phys: [place]  1. The left ventricle has hyperdynamic systolic function, with an ejection fraction of >65%. The cavity size was normal. Left ventricular diastolic Doppler parameters are consistent with impaired relaxation.  2. The right ventricle has normal systolic function. The cavity was normal. There is no increase in right ventricular wall thickness.  3. Left atrial size was mildly dilated.  4. The mitral valve is abnormal. Mild thickening of the mitral valve leaflet. There is moderate mitral annular calcification present.  5. The aortic valve is tricuspid. Mild thickening of the aortic valve. Moderate calcification of the aortic valve.  6. The interatrial septum was not assessed. FINDINGS  Left Ventricle: The left ventricle has hyperdynamic systolic function, with an ejection fraction of >65%. The cavity size was normal. The left ventricular wall thickness was not assessed. Left ventricular diastolic Doppler parameters are consistent with  impaired relaxation. Right Ventricle: The right ventricle has normal systolic function. The cavity was normal. There is no increase in right ventricular wall thickness. Left Atrium: Left atrial size was mildly dilated. Right Atrium: Right atrial size was normal in size. Right atrial pressure is estimated at 10  mmHg. Interatrial Septum: The interatrial septum was not assessed. Pericardium: There is no evidence of pericardial effusion. Mitral Valve: The mitral valve is abnormal. Mild thickening of the mitral valve leaflet. There is moderate mitral annular calcification present. Mitral valve regurgitation is trivial by color flow Doppler. Tricuspid Valve: The tricuspid valve is normal in structure. Tricuspid valve regurgitation is trivial by color flow Doppler. Aortic Valve: The aortic valve is tricuspid Mild thickening of the aortic valve. Moderate calcification of the aortic valve. Aortic valve regurgitation was not visualized by color flow Doppler. Pulmonic Valve: The pulmonic valve was normal in structure. Pulmonic valve regurgitation is not visualized by color flow Doppler. Venous: The inferior vena cava is normal in size with greater than 50% respiratory variability.  +--------------+--------++ LEFT VENTRICLE         +----------------+---------++ +--------------+--------++ Diastology                PLAX 2D                +----------------+---------++ +--------------+--------++ LV e' lateral:  5.98 cm/s LVIDd:        3.60 cm  +----------------+---------++ +--------------+--------++ LV E/e' lateral:18.6      LVIDs:        2.40 cm  +----------------+---------++ +--------------+--------++ LV e' medial:   4.90 cm/s LV PW:        1.00 cm  +----------------+---------++ +--------------+--------++ LV E/e' medial: 22.7      LV IVS:       1.10 cm  +----------------+---------++ +--------------+--------++ LVOT diam:    2.00 cm  +--------------+--------++ LV SV:        34 ml    +--------------+--------++ LV SV Index:  20.93    +--------------+--------++ LVOT Area:    3.14 cm +--------------+--------++                        +--------------+--------++ +---------------+----------++  RIGHT VENTRICLE           +---------------+----------++ RV S prime:    12.10 cm/s  +---------------+----------++ TAPSE (M-mode):2.2 cm     +---------------+----------++ RVSP:          24.2 mmHg  +---------------+----------++ +---------------+-------++-----------++ LEFT ATRIUM           Index       +---------------+-------++-----------++ LA diam:       2.80 cm1.75 cm/m  +---------------+-------++-----------++ LA Vol (A2C):  51.5 ml32.22 ml/m +---------------+-------++-----------++ LA Vol (A4C):  56.7 ml35.48 ml/m +---------------+-------++-----------++ LA Biplane Vol:57.8 ml36.17 ml/m +---------------+-------++-----------++ +------------+---------++-----------++ RIGHT ATRIUM         Index       +------------+---------++-----------++ RA Pressure:3.00 mmHg            +------------+---------++-----------++ RA Area:    11.30 cm            +------------+---------++-----------++ RA Volume:  24.70 ml 15.45 ml/m +------------+---------++-----------++  +------------+-----------++ AORTIC VALVE            +------------+-----------++ LVOT Vmax:  122.00 cm/s +------------+-----------++ LVOT Vmean: 86.500 cm/s +------------+-----------++ LVOT VTI:   0.330 m     +------------+-----------++  +-------------+-------++ AORTA                +-------------+-------++ Ao Root diam:3.00 cm +-------------+-------++ +--------------+-----------++ +---------------+-----------++ MITRAL VALVE              TRICUSPID VALVE            +--------------+-----------++ +---------------+-----------++ MV Area (PHT):2.08 cm    TR Peak grad:  21.2 mmHg   +--------------+-----------++ +---------------+-----------++ MV PHT:       106.00 msec TR Vmax:       230.00 cm/s +--------------+-----------++ +---------------+-----------++ +--------------+-----------++ Estimated RAP: 3.00 mmHg   MV E velocity:111.00 cm/s +---------------+-----------++ +--------------+-----------++ RVSP:          24.2 mmHg   MV A  velocity:156.00 cm/s +---------------+-----------++ +--------------+-----------++ MV E/A ratio: 0.71        +--------------+-------+ +--------------+-----------++ SHUNTS                                              +--------------+-------+                               Systemic VTI: 0.33 m                                +--------------+-------+                               Systemic Diam:2.00 cm                               +--------------+-------+  Dorris Carnes MD Electronically signed by Dorris Carnes MD Signature Date/Time: 10/24/2018/1:35:21 PM    Final    VAS US CAROTID (at [facility] and [facility] only)  Result Date: 10/25/2018 Carotid Arterial Duplex Study Indications:  CVA. Risk Factors: Hypertension. Performing Technologist: Abram Sander RVS  Examination Guidelines: A complete evaluation includes B-mode imaging, spectral Doppler, color Doppler, and power Doppler as needed of all accessible portions of each vessel. Bilateral testing is considered an integral part of  a complete examination. Limited examinations for reoccurring indications may be performed as noted.  Right Carotid Findings: +----------+--------+--------+--------+------------+--------+           PSV cm/sEDV cm/sStenosisDescribe    Comments +----------+--------+--------+--------+------------+--------+ CCA Prox  110     19              heterogenous         +----------+--------+--------+--------+------------+--------+ CCA Distal103     19              heterogenous         +----------+--------+--------+--------+------------+--------+ ICA Prox  73      17      1-39%   heterogenous         +----------+--------+--------+--------+------------+--------+ ICA Distal106     24                                   +----------+--------+--------+--------+------------+--------+ ECA       94      6                                    +----------+--------+--------+--------+------------+--------+  +----------+--------+-------+--------+-------------------+           PSV cm/sEDV cmsDescribeArm Pressure (mmHG) +----------+--------+-------+--------+-------------------+ [id number]                                        +----------+--------+-------+--------+-------------------+ +---------+--------+--+--------+--+---------+ VertebralPSV cm/s37EDV cm/s10Antegrade +---------+--------+--+--------+--+---------+  Left Carotid Findings: +----------+--------+--------+--------+------------+--------+           PSV cm/sEDV cm/sStenosisDescribe    Comments +----------+--------+--------+--------+------------+--------+ CCA Prox  132     14              heterogenous         +----------+--------+--------+--------+------------+--------+ CCA Distal100     17              heterogenous         +----------+--------+--------+--------+------------+--------+ ICA Prox  107     25      1-39%   heterogenous         +----------+--------+--------+--------+------------+--------+ ICA Distal82      13                                   +----------+--------+--------+--------+------------+--------+ ECA       80                                           +----------+--------+--------+--------+------------+--------+ +----------+--------+--------+--------+-------------------+ SubclavianPSV cm/sEDV cm/sDescribeArm Pressure (mmHG) +----------+--------+--------+--------+-------------------+           138                                         +----------+--------+--------+--------+-------------------+ +---------+--------+--+--------+--+---------+ VertebralPSV cm/s67EDV cm/s17Antegrade +---------+--------+--+--------+--+---------+  Summary: Right Carotid: Velocities in the right ICA are consistent with a 1-39% stenosis. Left Carotid: Velocities in the left ICA are consistent with a 1-39% stenosis. Vertebrals: Bilateral vertebral arteries demonstrate antegrade flow. *See  table(s) above for measurements and observations.  Electronically signed by Mamie Nick  Sethi MD on 10/25/2018 at 12:23:35 PM.    Final     Assessment & Plan:   Problem List Items Addressed This Visit     Grief    Discussed grief - doing ok, lives alone      Hemiparesis as late effect of cerebrovascular accident (CVA) ([place])    Hold HCTZ due to cramps  Check CMET      Leg cramps    Hold HCTZ Pt never took Gabapentin for LE neuropathy - re-start Check CMET      Memory problem    Doing better      Other Visit Diagnoses     Needs flu shot    -  Primary   Relevant Orders   Flu Vaccine QUAD High Dose(Fluad) (Completed)        Walker Kehr, MD

## 2021-02-12 NOTE — Assessment & Plan Note (Signed)
Discussed grief - doing ok, lives alone

## 2021-02-12 NOTE — Patient Instructions (Signed)
HCTZ may be causing cramps

## 2021-02-12 NOTE — Assessment & Plan Note (Addendum)
Hold HCTZ due to cramps  Check CMET

## 2021-02-12 NOTE — Assessment & Plan Note (Signed)
Hold HCTZ Pt never took Gabapentin for LE neuropathy - re-start Check CMET

## 2021-02-20 ENCOUNTER — Telehealth: Payer: Self-pay | Admitting: Adult Health

## 2021-02-20 NOTE — Telephone Encounter (Signed)
   Patient calling to discuss Plavix. [facility] declined to refill, requesting refill thru PCP     clopidogrel (PLAVIX) 75 MG tablet [Pharmacy Med Name: CLOPIDOGREL 75 MG TABLET] 90 tablet 1 02/20/2021    Request refused: Refill not appropriate (refills thru PCP)   Sig: TAKE 1 TABLET BY MOUTH EVERY DAY

## 2021-02-21 NOTE — Telephone Encounter (Signed)
Pt has made appt to see you 05/15/21. Pls advise if ok to send new rx for plavix under name

## 2021-02-22 MED ORDER — CLOPIDOGREL BISULFATE 75 MG PO TABS: 75.0000 mg | ORAL_TABLET | Freq: Every day | ORAL | 3 refills | Status: DC

## 2021-02-22 NOTE — Telephone Encounter (Signed)
Notified pt MD sent new rx to [facility] 

## 2021-02-22 NOTE — Telephone Encounter (Signed)
OK. Thx

## 2021-02-22 NOTE — Addendum Note (Signed)
Addended by: Cassandria Anger on: 02/22/2021 07:56 AM   Modules accepted: Orders

## 2021-05-08 DIAGNOSIS — H353132 Nonexudative age-related macular degeneration, bilateral, intermediate dry stage: Secondary | ICD-10-CM | POA: Diagnosis not present

## 2021-05-15 ENCOUNTER — Ambulatory Visit (INDEPENDENT_AMBULATORY_CARE_PROVIDER_SITE_OTHER): Payer: Medicare Other | Admitting: Internal Medicine

## 2021-05-15 ENCOUNTER — Encounter: Payer: Self-pay | Admitting: Internal Medicine

## 2021-05-15 ENCOUNTER — Other Ambulatory Visit: Payer: Self-pay

## 2021-05-15 DIAGNOSIS — I69359 Hemiplegia and hemiparesis following cerebral infarction affecting unspecified side: Secondary | ICD-10-CM

## 2021-05-15 DIAGNOSIS — R252 Cramp and spasm: Secondary | ICD-10-CM

## 2021-05-15 DIAGNOSIS — I10 Essential (primary) hypertension: Secondary | ICD-10-CM | POA: Diagnosis not present

## 2021-05-15 DIAGNOSIS — F4321 Adjustment disorder with depressed mood: Secondary | ICD-10-CM

## 2021-05-15 DIAGNOSIS — R413 Other amnesia: Secondary | ICD-10-CM

## 2021-05-15 DIAGNOSIS — F32A Depression, unspecified: Secondary | ICD-10-CM | POA: Diagnosis not present

## 2021-05-15 LAB — COMPREHENSIVE METABOLIC PANEL
ALT: 12 U/L (ref 0–35)
AST: 18 U/L (ref 0–37)
Albumin: 4.3 g/dL (ref 3.5–5.2)
Alkaline Phosphatase: 59 U/L (ref 39–117)
BUN: 26 mg/dL — ABNORMAL HIGH (ref 6–23)
CO2: 30 mEq/L (ref 19–32)
Calcium: 9.9 mg/dL (ref 8.4–10.5)
Chloride: 103 mEq/L (ref 96–112)
Creatinine, Ser: 1.01 mg/dL (ref 0.40–1.20)
GFR: 50.48 mL/min — ABNORMAL LOW (ref >60.00)
Glucose, Bld: 101 mg/dL — ABNORMAL HIGH (ref 70–99)
Potassium: 4.4 mEq/L (ref 3.5–5.1)
Sodium: 141 mEq/L (ref 135–145)
Total Bilirubin: 0.4 mg/dL (ref 0.2–1.2)
Total Protein: 6.9 g/dL (ref 6.0–8.3)

## 2021-05-15 LAB — CBC WITH DIFFERENTIAL/PLATELET
Basophils Absolute: 0.1 10*3/uL (ref 0.0–0.1)
Basophils Relative: 1 % (ref 0.0–3.0)
Eosinophils Absolute: 0.1 10*3/uL (ref 0.0–0.7)
Eosinophils Relative: 1.6 % (ref 0.0–5.0)
HCT: 41 % (ref 36.0–46.0)
Hemoglobin: 13.4 g/dL (ref 12.0–15.0)
Lymphocytes Relative: 27 % (ref 12.0–46.0)
Lymphs Abs: 2.2 10*3/uL (ref 0.7–4.0)
MCHC: 32.8 g/dL (ref 30.0–36.0)
MCV: 84 fl (ref 78.0–100.0)
Monocytes Absolute: 0.9 10*3/uL (ref 0.1–1.0)
Monocytes Relative: 11.3 % (ref 3.0–12.0)
Neutro Abs: 4.9 10*3/uL (ref 1.4–7.7)
Neutrophils Relative %: 59.1 % (ref 43.0–77.0)
Platelets: 221 10*3/uL (ref 150.0–400.0)
RBC: 4.88 Mil/uL (ref 3.87–5.11)
RDW: 13.8 % (ref 11.5–15.5)
WBC: 8.2 10*3/uL (ref 4.0–10.5)

## 2021-05-15 LAB — LIPID PANEL
Cholesterol: 195 mg/dL (ref 0–200)
HDL: 60.6 mg/dL (ref >39.00)
LDL Cholesterol: 100 mg/dL — ABNORMAL HIGH (ref 0–99)
NonHDL: 134.43
Total CHOL/HDL Ratio: 3
Triglycerides: 170 mg/dL — ABNORMAL HIGH (ref 0.0–149.0)
VLDL: 34 mg/dL (ref 0.0–40.0)

## 2021-05-15 LAB — URINALYSIS
Bilirubin Urine: NEGATIVE
Hgb urine dipstick: NEGATIVE
Ketones, ur: NEGATIVE
Leukocytes,Ua: NEGATIVE
Nitrite: NEGATIVE
Specific Gravity, Urine: <=1.005 — AB (ref 1.000–1.030)
Total Protein, Urine: NEGATIVE
Urine Glucose: NEGATIVE
Urobilinogen, UA: 0.2 (ref 0.0–1.0)
pH: 6 (ref 5.0–8.0)

## 2021-05-15 LAB — TSH: TSH: 2.34 u[IU]/mL (ref 0.35–5.50)

## 2021-05-15 MED ORDER — LOSARTAN POTASSIUM-HCTZ 100-25 MG PO TABS: 1.0000 | ORAL_TABLET | Freq: Every day | ORAL | 3 refills | Status: DC

## 2021-05-15 NOTE — Assessment & Plan Note (Signed)
Cont on Plavix, Zetia, Benicar HCT, Amlodipine

## 2021-05-15 NOTE — Assessment & Plan Note (Signed)
Cont on  Amlodipine, Losartan HCT

## 2021-05-15 NOTE — Assessment & Plan Note (Signed)
Better Lion's mane

## 2021-05-15 NOTE — Progress Notes (Signed)
Subjective:  Patient ID: Cynthia Garza, female    DOB: 03/14/1933  Age: 87 y.o. MRN: [id number]  CC: Follow-up (3 month f/u)   HPI AINE STRYCHARZ presents for depression, h/o CVA, HTN Cramps are better   Outpatient Medications Prior to Visit  Medication Sig Dispense Refill   amLODipine (NORVASC) 5 MG tablet TAKE 1 TABLET BY MOUTH EVERY DAY 90 tablet 1   BIOTIN PO Take by mouth.     buPROPion (WELLBUTRIN XL) 150 MG 24 hr tablet TAKE 1 TABLET BY MOUTH DAILY 90 tablet 3   Cholecalciferol (VITAMIN D3) 50 MCG (2000 UT) capsule Take 1 capsule (2,000 Units total) by mouth daily. 100 capsule 3   clopidogrel (PLAVIX) 75 MG tablet Take 1 tablet (75 mg total) by mouth daily. 90 tablet 3   ezetimibe (ZETIA) 10 MG tablet Take 1 tablet (10 mg total) by mouth daily. 90 tablet 3   Famotidine (PEPCID PO) Take by mouth.     gabapentin (NEURONTIN) 100 MG capsule Take 100 mg by mouth daily. Take 100 mg by mouth at bedtime     meclizine (ANTIVERT) 12.5 MG tablet Take 1 tablet (12.5 mg total) by mouth 2 (two) times daily as needed for dizziness. 60 tablet 2   Multiple Vitamins-Minerals (ALIVE ONCE DAILY WOMENS 50+) TABS Take 1 tablet by mouth daily.     Multiple Vitamins-Minerals (PRESERVISION AREDS 2 PO) Take 1 tablet by mouth 2 (two) times daily.     ondansetron (ZOFRAN) 4 MG tablet Take 1 tablet (4 mg total) by mouth every 8 (eight) hours as needed for nausea or vomiting. 15 tablet 0   Propylene Glycol 0.6 % SOLN Place 2 drops into both eyes at bedtime.     vitamin B-12 (CYANOCOBALAMIN) 1000 MCG tablet Take 1,000 mcg by mouth daily.     zolpidem (AMBIEN) 10 MG tablet Take 0.5-1 tablets (5-10 mg total) by mouth at bedtime as needed for sleep. 90 tablet 1   olmesartan (BENICAR) 40 MG tablet Take 1 tablet (40 mg total) by mouth daily. 90 tablet 3   albuterol (PROVENTIL HFA) 108 (90 Base) MCG/ACT inhaler Inhale 2 puffs into the lungs every 4 (four) hours as needed for wheezing or  shortness of breath. 8.5 g 3   No facility-administered medications prior to visit.    ROS: Review of Systems  Constitutional:  Negative for activity change, appetite change, chills, fatigue and unexpected weight change.  HENT:  Negative for congestion, mouth sores and sinus pressure.   Eyes:  Negative for visual disturbance.  Respiratory:  Negative for cough and chest tightness.   Gastrointestinal:  Negative for abdominal pain and nausea.  Genitourinary:  Negative for difficulty urinating, frequency and vaginal pain.  Musculoskeletal:  Negative for back pain and gait problem.  Skin:  Negative for pallor and rash.  Neurological:  Negative for dizziness, tremors, weakness, numbness and headaches.  Psychiatric/Behavioral:  Negative for confusion and sleep disturbance.    Objective:  BP 134/70 (BP Location: Left Arm)    Pulse 78    Temp 98.2 F (36.8 C) (Oral)    Ht 5\' 1"  (1.549 m)    Wt 138 lb 12.8 oz (63 kg)    SpO2 97%    BMI 26.23 kg/m   BP Readings from Last 3 Encounters:  05/15/21 134/70  02/12/21 132/62  11/12/20 116/70    Wt Readings from Last 3 Encounters:  05/15/21 138 lb 12.8 oz (63 kg)  02/12/21 136 lb 3.2 oz (  61.8 kg)  11/12/20 133 lb 9.6 oz (60.6 kg)    Physical Exam Constitutional:      General: She is not in acute distress.    Appearance: Normal appearance. She is well-developed.  HENT:     Head: Normocephalic.     Right Ear: External ear normal.     Left Ear: External ear normal.     Nose: Nose normal.  Eyes:     General:        Right eye: No discharge.        Left eye: No discharge.     Conjunctiva/sclera: Conjunctivae normal.     Pupils: Pupils are equal, round, and reactive to light.  Neck:     Thyroid: No thyromegaly.     Vascular: No JVD.     Trachea: No tracheal deviation.  Cardiovascular:     Rate and Rhythm: Normal rate and regular rhythm.     Heart sounds: Normal heart sounds.  Pulmonary:     Effort: No respiratory distress.     Breath  sounds: No stridor. No wheezing.  Abdominal:     General: Bowel sounds are normal. There is no distension.     Palpations: Abdomen is soft. There is no mass.     Tenderness: There is no abdominal tenderness. There is no guarding or rebound.  Musculoskeletal:        General: No tenderness.     Cervical back: Normal range of motion and neck supple. No rigidity.  Lymphadenopathy:     Cervical: No cervical adenopathy.  Skin:    Findings: No erythema or rash.  Neurological:     Cranial Nerves: No cranial nerve deficit.     Motor: No abnormal muscle tone.     Coordination: Coordination normal.     Deep Tendon Reflexes: Reflexes normal.  Psychiatric:        Behavior: Behavior normal.        Thought Content: Thought content normal.        Judgment: Judgment normal.    Lab Results  Component Value Date   WBC 6.6 04/13/2020   HGB 13.6 04/13/2020   HCT 40.3 04/13/2020   PLT 226.0 04/13/2020   GLUCOSE 102 (H) 02/12/2021   CHOL 207 (H) 04/13/2020   TRIG 148.0 04/13/2020   HDL 60.10 04/13/2020   LDLDIRECT 96.0 04/13/2019   LDLCALC 117 (H) 04/13/2020   ALT 8 02/12/2021   AST 16 02/12/2021   NA 141 02/12/2021   K 3.8 02/12/2021   CL 104 02/12/2021   CREATININE 1.11 02/12/2021   BUN 27 (H) 02/12/2021   CO2 29 02/12/2021   TSH 1.29 11/12/2020   INR 1.0 10/24/2018   HGBA1C 5.6 10/24/2018    CT Head Wo Contrast  Result Date: 10/24/2018 CLINICAL DATA:  Right upper extremity heaviness and lower extremity numbness EXAM: CT HEAD WITHOUT CONTRAST TECHNIQUE: Contiguous axial images were obtained from the base of the skull through the vertex without intravenous contrast. COMPARISON:  None. FINDINGS: Brain: No acute territorial infarction, hemorrhage, or intracranial mass. The ventricles are nonenlarged. Vascular: No hyperdense vessels.  Carotid vascular calcification. Skull: Normal. Negative for fracture or focal lesion. Sinuses/Orbits: No acute finding. Other: Chronic appearing abnormality at  the anterior arch of C1, incompletely visualized. Prominent degenerative changes at the C1-C2 articulation. IMPRESSION: Negative non contrasted CT appearance of the brain.  Mild atrophy Electronically Signed   By: Donavan Foil M.D.   On: 10/24/2018 01:00   MR BRAIN WO CONTRAST  Result Date: 10/24/2018 CLINICAL DATA:  Initial evaluation for new onset right-sided arm and leg weakness. EXAM: MRI HEAD WITHOUT CONTRAST MRA HEAD WITHOUT CONTRAST TECHNIQUE: Multiplanar, multiecho pulse sequences of the brain and surrounding structures were obtained without intravenous contrast. Angiographic images of the head were obtained using MRA technique without contrast. COMPARISON:  Prior CT from earlier the same day. FINDINGS: MRI HEAD FINDINGS Brain: Cerebral volume within normal limits for age. Minimal T2/FLAIR hyperintensity noted within the periventricular white matter, nonspecific, and felt to be within normal limits for age. 7 mm focus of diffusion abnormality seen at the mid-posterior left centrum semi ovale (series 5, image 80). No associated hemorrhage. No other evidence for acute or subacute ischemia. Gray-white matter differentiation otherwise maintained. No other areas of remote cortical infarction. No foci of susceptibility artifact to suggest acute or chronic intracranial hemorrhage. No mass lesion, midline shift or mass effect. No hydrocephalus. No extra-axial fluid collection. Pituitary gland suprasellar region normal. Midline structures intact. Vascular: Major intracranial vascular flow voids are maintained. Skull and upper cervical spine: Craniocervical junction normal. Bone marrow signal intensity within normal limits. Hyperostosis frontalis interna noted. Scalp soft tissues unremarkable. Sinuses/Orbits: Patient status post bilateral ocular lens replacement. Globes orbital soft tissues demonstrate no acute finding. Paranasal sinuses are clear. No mastoid effusion. Inner ear structures normal. Other: None.  MRA HEAD FINDINGS ANTERIOR CIRCULATION: Distal cervical segments of the internal carotid arteries are patent with symmetric antegrade flow. Petrous segments widely patent bilaterally. Scattered atheromatous irregularity within the cavernous/supraclinoid ICAs. No significant stenosis seen on the right. There is a short-segment severe stenosis at the anterior genu of the cavernous left ICA (series 9, image 104). ICA termini well perfused. A1 segments patent bilaterally. Normal anterior communicating artery. Anterior cerebral arteries widely patent to their distal aspects. M1 segments widely patent bilaterally. Normal MCA bifurcations. Distal MCA branches well perfused and symmetric. Mild distal small vessel atheromatous irregularity. POSTERIOR CIRCULATION: Vertebral arteries patent to the vertebrobasilar junction without stenosis. Left vertebral artery dominant. Posterior inferior cerebral arteries patent bilaterally. Basilar patent to its distal aspect without stenosis. Superior cerebral arteries patent bilaterally. Both of the posterior cerebral arteries primarily supplied via the basilar and are well perfused to their distal aspects. No intracranial aneurysm. IMPRESSION: MRI HEAD IMPRESSION: 1. 7 mm acute ischemic nonhemorrhagic infarct involving the deep white matter of the mid-posterior left centrum semi ovale. 2. Otherwise normal brain MRI for age. MRA HEAD IMPRESSION: 1. Negative intracranial MRA for large vessel occlusion. 2. Short-segment severe cavernous left ICA stenosis. 3. Otherwise negative intracranial MRA. No other hemodynamically significant or correctable stenosis. Electronically Signed   By: Jeannine Boga M.D.   On: 10/24/2018 06:59   DG Chest Portable 1 View  Result Date: 10/24/2018 CLINICAL DATA:  TIA EXAM: PORTABLE CHEST 1 VIEW COMPARISON:  08/14/2017 FINDINGS: The heart size and mediastinal contours are within normal limits. Both lungs are clear. Surgical clips over the axilla  bilaterally. Aortic atherosclerosis. IMPRESSION: No active disease. Electronically Signed   By: Donavan Foil M.D.   On: 10/24/2018 00:43   MR MRA HEAD WO CONTRAST  Result Date: 10/24/2018 CLINICAL DATA:  Initial evaluation for new onset right-sided arm and leg weakness. EXAM: MRI HEAD WITHOUT CONTRAST MRA HEAD WITHOUT CONTRAST TECHNIQUE: Multiplanar, multiecho pulse sequences of the brain and surrounding structures were obtained without intravenous contrast. Angiographic images of the head were obtained using MRA technique without contrast. COMPARISON:  Prior CT from earlier the same day. FINDINGS: MRI HEAD FINDINGS Brain: Cerebral volume  within normal limits for age. Minimal T2/FLAIR hyperintensity noted within the periventricular white matter, nonspecific, and felt to be within normal limits for age. 7 mm focus of diffusion abnormality seen at the mid-posterior left centrum semi ovale (series 5, image 80). No associated hemorrhage. No other evidence for acute or subacute ischemia. Gray-white matter differentiation otherwise maintained. No other areas of remote cortical infarction. No foci of susceptibility artifact to suggest acute or chronic intracranial hemorrhage. No mass lesion, midline shift or mass effect. No hydrocephalus. No extra-axial fluid collection. Pituitary gland suprasellar region normal. Midline structures intact. Vascular: Major intracranial vascular flow voids are maintained. Skull and upper cervical spine: Craniocervical junction normal. Bone marrow signal intensity within normal limits. Hyperostosis frontalis interna noted. Scalp soft tissues unremarkable. Sinuses/Orbits: Patient status post bilateral ocular lens replacement. Globes orbital soft tissues demonstrate no acute finding. Paranasal sinuses are clear. No mastoid effusion. Inner ear structures normal. Other: None. MRA HEAD FINDINGS ANTERIOR CIRCULATION: Distal cervical segments of the internal carotid arteries are patent with  symmetric antegrade flow. Petrous segments widely patent bilaterally. Scattered atheromatous irregularity within the cavernous/supraclinoid ICAs. No significant stenosis seen on the right. There is a short-segment severe stenosis at the anterior genu of the cavernous left ICA (series 9, image 104). ICA termini well perfused. A1 segments patent bilaterally. Normal anterior communicating artery. Anterior cerebral arteries widely patent to their distal aspects. M1 segments widely patent bilaterally. Normal MCA bifurcations. Distal MCA branches well perfused and symmetric. Mild distal small vessel atheromatous irregularity. POSTERIOR CIRCULATION: Vertebral arteries patent to the vertebrobasilar junction without stenosis. Left vertebral artery dominant. Posterior inferior cerebral arteries patent bilaterally. Basilar patent to its distal aspect without stenosis. Superior cerebral arteries patent bilaterally. Both of the posterior cerebral arteries primarily supplied via the basilar and are well perfused to their distal aspects. No intracranial aneurysm. IMPRESSION: MRI HEAD IMPRESSION: 1. 7 mm acute ischemic nonhemorrhagic infarct involving the deep white matter of the mid-posterior left centrum semi ovale. 2. Otherwise normal brain MRI for age. MRA HEAD IMPRESSION: 1. Negative intracranial MRA for large vessel occlusion. 2. Short-segment severe cavernous left ICA stenosis. 3. Otherwise negative intracranial MRA. No other hemodynamically significant or correctable stenosis. Electronically Signed   By: Jeannine Boga M.D.   On: 10/24/2018 06:59   ECHOCARDIOGRAM COMPLETE  Result Date: 10/24/2018   ECHOCARDIOGRAM REPORT   Patient Name:   KALEE BROXTON Date of Exam: 10/24/2018 Medical Rec #:  [id number]           Height:       61.0 in Accession #:    [id number]          Weight:       135.0 lb Date of Birth:  September 12, 1932            BSA:          1.60 m Patient Age:    29 years            BP:           118/57 mmHg  Patient Gender: F                   HR:           75 bpm. Exam Location:  Inpatient  Procedure: 2D Echo Indications:    Stroke 434.91  History:        Patient has no prior history of Echocardiogram examinations.  Signs/Symptoms: Murmur Risk Factors: Hypertension and                 Dyslipidemia.  Sonographer:    Mikki Santee RDCS (AE) Referring Phys: [place]  1. The left ventricle has hyperdynamic systolic function, with an ejection fraction of >65%. The cavity size was normal. Left ventricular diastolic Doppler parameters are consistent with impaired relaxation.  2. The right ventricle has normal systolic function. The cavity was normal. There is no increase in right ventricular wall thickness.  3. Left atrial size was mildly dilated.  4. The mitral valve is abnormal. Mild thickening of the mitral valve leaflet. There is moderate mitral annular calcification present.  5. The aortic valve is tricuspid. Mild thickening of the aortic valve. Moderate calcification of the aortic valve.  6. The interatrial septum was not assessed. FINDINGS  Left Ventricle: The left ventricle has hyperdynamic systolic function, with an ejection fraction of >65%. The cavity size was normal. The left ventricular wall thickness was not assessed. Left ventricular diastolic Doppler parameters are consistent with  impaired relaxation. Right Ventricle: The right ventricle has normal systolic function. The cavity was normal. There is no increase in right ventricular wall thickness. Left Atrium: Left atrial size was mildly dilated. Right Atrium: Right atrial size was normal in size. Right atrial pressure is estimated at 10 mmHg. Interatrial Septum: The interatrial septum was not assessed. Pericardium: There is no evidence of pericardial effusion. Mitral Valve: The mitral valve is abnormal. Mild thickening of the mitral valve leaflet. There is moderate mitral annular calcification present. Mitral valve  regurgitation is trivial by color flow Doppler. Tricuspid Valve: The tricuspid valve is normal in structure. Tricuspid valve regurgitation is trivial by color flow Doppler. Aortic Valve: The aortic valve is tricuspid Mild thickening of the aortic valve. Moderate calcification of the aortic valve. Aortic valve regurgitation was not visualized by color flow Doppler. Pulmonic Valve: The pulmonic valve was normal in structure. Pulmonic valve regurgitation is not visualized by color flow Doppler. Venous: The inferior vena cava is normal in size with greater than 50% respiratory variability.  +--------------+--------++  LEFT VENTRICLE            +----------------+---------++ +--------------+--------++  Diastology                    PLAX 2D                   +----------------+---------++ +--------------+--------++  LV e' lateral:   5.98 cm/s    LVIDd:         3.60 cm    +----------------+---------++ +--------------+--------++  LV E/e' lateral: 18.6         LVIDs:         2.40 cm    +----------------+---------++ +--------------+--------++  LV e' medial:    4.90 cm/s    LV PW:         1.00 cm    +----------------+---------++ +--------------+--------++  LV E/e' medial:  22.7         LV IVS:        1.10 cm    +----------------+---------++ +--------------+--------++  LVOT diam:     2.00 cm    +--------------+--------++  LV SV:         34 ml      +--------------+--------++  LV SV Index:   20.93      +--------------+--------++  LVOT Area:     3.14 cm   +--------------+--------++                            +--------------+--------++ +---------------+----------++  RIGHT VENTRICLE              +---------------+----------++  RV S prime:     12.10 cm/s   +---------------+----------++  TAPSE (M-mode): 2.2 cm       +---------------+----------++  RVSP:           24.2 mmHg    +---------------+----------++ +---------------+-------++-----------++  LEFT ATRIUM              Index         +---------------+-------++-----------++  LA diam:         2.80 cm  1.75 cm/m    +---------------+-------++-----------++  LA Vol (A2C):   51.5 ml  32.22 ml/m   +---------------+-------++-----------++  LA Vol (A4C):   56.7 ml  35.48 ml/m   +---------------+-------++-----------++  LA Biplane Vol: 57.8 ml  36.17 ml/m   +---------------+-------++-----------++ +------------+---------++-----------++  RIGHT ATRIUM            Index         +------------+---------++-----------++  RA Pressure: 3.00 mmHg                +------------+---------++-----------++  RA Area:     11.30 cm                +------------+---------++-----------++  RA Volume:   24.70 ml   15.45 ml/m   +------------+---------++-----------++  +------------+-----------++  AORTIC VALVE               +------------+-----------++  LVOT Vmax:   122.00 cm/s   +------------+-----------++  LVOT Vmean:  86.500 cm/s   +------------+-----------++  LVOT VTI:    0.330 m       +------------+-----------++  +-------------+-------++  AORTA                   +-------------+-------++  Ao Root diam: 3.00 cm   +-------------+-------++ +--------------+-----------++ +---------------+-----------++  MITRAL VALVE                  TRICUSPID VALVE               +--------------+-----------++ +---------------+-----------++  MV Area (PHT): 2.08 cm       TR Peak grad:   21.2 mmHg     +--------------+-----------++ +---------------+-----------++  MV PHT:        106.00 msec    TR Vmax:        230.00 cm/s   +--------------+-----------++ +---------------+-----------++ +--------------+-----------++  Estimated RAP:  3.00 mmHg      MV E velocity: 111.00 cm/s   +---------------+-----------++ +--------------+-----------++  RVSP:           24.2 mmHg      MV A velocity: 156.00 cm/s   +---------------+-----------++ +--------------+-----------++  MV E/A ratio:  0.71          +--------------+-------+ +--------------+-----------++  SHUNTS                                                +--------------+-------+                                Systemic VTI:   0.33 m                                 +--------------+-------+  Systemic Diam: 2.00 cm                                +--------------+-------+  Dorris Carnes MD Electronically signed by Dorris Carnes MD Signature Date/Time: 10/24/2018/1:35:21 PM    Final    VAS US CAROTID (at [facility] and [place] only)  Result Date: 10/25/2018 Carotid Arterial Duplex Study Indications:  CVA. Risk Factors: Hypertension. Performing Technologist: Abram Sander RVS  Examination Guidelines: A complete evaluation includes B-mode imaging, spectral Doppler, color Doppler, and power Doppler as needed of all accessible portions of each vessel. Bilateral testing is considered an integral part of a complete examination. Limited examinations for reoccurring indications may be performed as noted.  Right Carotid Findings: +----------+--------+--------+--------+------------+--------+             PSV cm/s EDV cm/s Stenosis Describe     Comments  +----------+--------+--------+--------+------------+--------+  CCA Prox   110      19                heterogenous           +----------+--------+--------+--------+------------+--------+  CCA Distal 103      19                heterogenous           +----------+--------+--------+--------+------------+--------+  ICA Prox   73       17       1-39%    heterogenous           +----------+--------+--------+--------+------------+--------+  ICA Distal 106      24                                       +----------+--------+--------+--------+------------+--------+  ECA        94       6                                        +----------+--------+--------+--------+------------+--------+ +----------+--------+-------+--------+-------------------+             PSV cm/s EDV cms Describe Arm Pressure (mmHG)  +----------+--------+-------+--------+-------------------+  Subclavian 141                                            +----------+--------+-------+--------+-------------------+  +---------+--------+--+--------+--+---------+  Vertebral PSV cm/s 37 EDV cm/s 10 Antegrade  +---------+--------+--+--------+--+---------+  Left Carotid Findings: +----------+--------+--------+--------+------------+--------+             PSV cm/s EDV cm/s Stenosis Describe     Comments  +----------+--------+--------+--------+------------+--------+  CCA Prox   132      14                heterogenous           +----------+--------+--------+--------+------------+--------+  CCA Distal 100      17                heterogenous           +----------+--------+--------+--------+------------+--------+  ICA Prox   107      25       1-39%    heterogenous           +----------+--------+--------+--------+------------+--------+  ICA Distal 82       13                                       +----------+--------+--------+--------+------------+--------+  ECA        80                                                +----------+--------+--------+--------+------------+--------+ +----------+--------+--------+--------+-------------------+  Subclavian PSV cm/s EDV cm/s Describe Arm Pressure (mmHG)  +----------+--------+--------+--------+-------------------+             138                                             +----------+--------+--------+--------+-------------------+ +---------+--------+--+--------+--+---------+  Vertebral PSV cm/s 67 EDV cm/s 17 Antegrade  +---------+--------+--+--------+--+---------+  Summary: Right Carotid: Velocities in the right ICA are consistent with a 1-39% stenosis. Left Carotid: Velocities in the left ICA are consistent with a 1-39% stenosis. Vertebrals: Bilateral vertebral arteries demonstrate antegrade flow. *See table(s) above for measurements and observations.  Electronically signed by Antony Contras MD on 10/25/2018 at 12:23:35 PM.    Final     Assessment & Plan:   Problem List Items Addressed This Visit     Essential hypertension (Chronic)    Cont on  Amlodipine, Losartan HCT      Relevant  Medications   losartan-hydrochlorothiazide (HYZAAR) 100-25 MG tablet   Other Relevant Orders   TSH   Urinalysis   CBC with Differential/Platelet   Lipid panel   Comprehensive metabolic panel   Depression    Chronic; seasonal Cont on Wellbutrin XL      Relevant Orders   TSH   Grief    Discussed      Hemiparesis as late effect of cerebrovascular accident (CVA) ([place])    Cont on Plavix, Zetia, Benicar HCT, Amlodipine      Relevant Orders   Lipid panel   Comprehensive metabolic panel   Leg cramps    Try Magnesium oil spray as needed      Relevant Orders   TSH   CBC with Differential/Platelet   Memory problem    Better Lion's mane      Relevant Orders   Comprehensive metabolic panel      Meds ordered this encounter  Medications   losartan-hydrochlorothiazide (HYZAAR) 100-25 MG tablet    Sig: Take 1 tablet by mouth daily.    Dispense:  90 tablet    Refill:  3      Follow-up: Return in about 6 months (around 11/13/2021) for a follow-up visit.  Walker Kehr, MD

## 2021-05-15 NOTE — Assessment & Plan Note (Signed)
Try Magnesium oil spray as needed

## 2021-05-15 NOTE — Assessment & Plan Note (Signed)
Discussed.

## 2021-05-15 NOTE — Patient Instructions (Signed)
Try Magnesium oil spray as needed for cramps

## 2021-05-15 NOTE — Assessment & Plan Note (Signed)
Chronic; seasonal Cont on Wellbutrin XL

## 2021-06-04 DIAGNOSIS — Z1231 Encounter for screening mammogram for malignant neoplasm of breast: Secondary | ICD-10-CM | POA: Diagnosis not present

## 2021-06-04 LAB — HM MAMMOGRAPHY

## 2021-06-06 ENCOUNTER — Encounter: Payer: Self-pay | Admitting: Internal Medicine

## 2021-06-17 ENCOUNTER — Other Ambulatory Visit: Payer: Self-pay | Admitting: Internal Medicine

## 2021-08-27 DIAGNOSIS — L570 Actinic keratosis: Secondary | ICD-10-CM | POA: Diagnosis not present

## 2021-08-27 DIAGNOSIS — L814 Other melanin hyperpigmentation: Secondary | ICD-10-CM | POA: Diagnosis not present

## 2021-08-27 DIAGNOSIS — L820 Inflamed seborrheic keratosis: Secondary | ICD-10-CM | POA: Diagnosis not present

## 2021-08-27 DIAGNOSIS — L821 Other seborrheic keratosis: Secondary | ICD-10-CM | POA: Diagnosis not present

## 2021-08-27 DIAGNOSIS — L578 Other skin changes due to chronic exposure to nonionizing radiation: Secondary | ICD-10-CM | POA: Diagnosis not present

## 2021-08-27 DIAGNOSIS — D229 Melanocytic nevi, unspecified: Secondary | ICD-10-CM | POA: Diagnosis not present

## 2021-11-05 DIAGNOSIS — H353132 Nonexudative age-related macular degeneration, bilateral, intermediate dry stage: Secondary | ICD-10-CM | POA: Diagnosis not present

## 2021-11-13 ENCOUNTER — Ambulatory Visit (INDEPENDENT_AMBULATORY_CARE_PROVIDER_SITE_OTHER): Payer: Medicare Other | Admitting: Internal Medicine

## 2021-11-13 ENCOUNTER — Encounter: Payer: Self-pay | Admitting: Internal Medicine

## 2021-11-13 VITALS — BP 130/68 | HR 70 | Temp 98.0°F | Ht 61.0 in | Wt 139.0 lb

## 2021-11-13 DIAGNOSIS — I10 Essential (primary) hypertension: Secondary | ICD-10-CM

## 2021-11-13 DIAGNOSIS — F32A Depression, unspecified: Secondary | ICD-10-CM

## 2021-11-13 DIAGNOSIS — F4321 Adjustment disorder with depressed mood: Secondary | ICD-10-CM | POA: Diagnosis not present

## 2021-11-13 DIAGNOSIS — I69359 Hemiplegia and hemiparesis following cerebral infarction affecting unspecified side: Secondary | ICD-10-CM | POA: Diagnosis not present

## 2021-11-13 DIAGNOSIS — Z8673 Personal history of transient ischemic attack (TIA), and cerebral infarction without residual deficits: Secondary | ICD-10-CM | POA: Diagnosis not present

## 2021-11-13 DIAGNOSIS — M810 Age-related osteoporosis without current pathological fracture: Secondary | ICD-10-CM | POA: Diagnosis not present

## 2021-11-13 DIAGNOSIS — E785 Hyperlipidemia, unspecified: Secondary | ICD-10-CM

## 2021-11-13 LAB — COMPREHENSIVE METABOLIC PANEL
ALT: 10 U/L (ref 0–35)
AST: 16 U/L (ref 0–37)
Albumin: 4.3 g/dL (ref 3.5–5.2)
Alkaline Phosphatase: 67 U/L (ref 39–117)
BUN: 24 mg/dL — ABNORMAL HIGH (ref 6–23)
CO2: 27 mEq/L (ref 19–32)
Calcium: 9.7 mg/dL (ref 8.4–10.5)
Chloride: 103 mEq/L (ref 96–112)
Creatinine, Ser: 0.96 mg/dL (ref 0.40–1.20)
GFR: 53.47 mL/min — ABNORMAL LOW (ref >60.00)
Glucose, Bld: 96 mg/dL (ref 70–99)
Potassium: 3.6 mEq/L (ref 3.5–5.1)
Sodium: 140 mEq/L (ref 135–145)
Total Bilirubin: 0.4 mg/dL (ref 0.2–1.2)
Total Protein: 7 g/dL (ref 6.0–8.3)

## 2021-11-13 LAB — CBC WITH DIFFERENTIAL/PLATELET
Basophils Absolute: 0.1 10*3/uL (ref 0.0–0.1)
Basophils Relative: 0.7 % (ref 0.0–3.0)
Eosinophils Absolute: 0.1 10*3/uL (ref 0.0–0.7)
Eosinophils Relative: 1.2 % (ref 0.0–5.0)
HCT: 41 % (ref 36.0–46.0)
Hemoglobin: 13.7 g/dL (ref 12.0–15.0)
Lymphocytes Relative: 26.4 % (ref 12.0–46.0)
Lymphs Abs: 2.3 10*3/uL (ref 0.7–4.0)
MCHC: 33.5 g/dL (ref 30.0–36.0)
MCV: 83.8 fl (ref 78.0–100.0)
Monocytes Absolute: 1 10*3/uL (ref 0.1–1.0)
Monocytes Relative: 11.4 % (ref 3.0–12.0)
Neutro Abs: 5.3 10*3/uL (ref 1.4–7.7)
Neutrophils Relative %: 60.3 % (ref 43.0–77.0)
Platelets: 232 10*3/uL (ref 150.0–400.0)
RBC: 4.89 Mil/uL (ref 3.87–5.11)
RDW: 13.9 % (ref 11.5–15.5)
WBC: 8.8 10*3/uL (ref 4.0–10.5)

## 2021-11-13 MED ORDER — EZETIMIBE 10 MG PO TABS: 10.0000 mg | ORAL_TABLET | Freq: Every day | ORAL | 3 refills | Status: DC

## 2021-11-13 NOTE — Progress Notes (Signed)
Subjective:  Patient ID: Cynthia Garza, female    DOB: [birth year]  Age: 88 y.o. MRN: [id number]  CC: No chief complaint on file.   HPI [name] presents for grief, depression, HTN, h/o CVA  Outpatient Medications Prior to Visit  Medication Sig Dispense Refill   amLODipine (NORVASC) 5 MG tablet TAKE 1 TABLET BY MOUTH EVERY DAY 90 tablet 1   BIOTIN PO Take by mouth.     buPROPion (WELLBUTRIN XL) 150 MG 24 hr tablet TAKE 1 TABLET BY MOUTH DAILY 90 tablet 3   Cholecalciferol (VITAMIN D3) 50 MCG (2000 UT) capsule Take 1 capsule (2,000 Units total) by mouth daily. 100 capsule 3   clopidogrel (PLAVIX) 75 MG tablet Take 1 tablet (75 mg total) by mouth daily. 90 tablet 3   Famotidine (PEPCID PO) Take by mouth.     losartan-hydrochlorothiazide (HYZAAR) 100-25 MG tablet Take 1 tablet by mouth daily. 90 tablet 3   meclizine (ANTIVERT) 12.5 MG tablet Take 1 tablet (12.5 mg total) by mouth 2 (two) times daily as needed for dizziness. 60 tablet 2   Multiple Vitamins-Minerals (ALIVE ONCE DAILY WOMENS 50+) TABS Take 1 tablet by mouth daily.     Multiple Vitamins-Minerals (PRESERVISION AREDS 2 PO) Take 1 tablet by mouth 2 (two) times daily.     Propylene Glycol 0.6 % SOLN Place 2 drops into both eyes at bedtime.     vitamin B-12 (CYANOCOBALAMIN) 1000 MCG tablet Take 1,000 mcg by mouth daily.     ezetimibe (ZETIA) 10 MG tablet Take 1 tablet (10 mg total) by mouth daily. 90 tablet 3   gabapentin (NEURONTIN) 100 MG capsule Take 100 mg by mouth daily. Take 100 mg by mouth at bedtime     ondansetron (ZOFRAN) 4 MG tablet Take 1 tablet (4 mg total) by mouth every 8 (eight) hours as needed for nausea or vomiting. 15 tablet 0   zolpidem (AMBIEN) 10 MG tablet Take 0.5-1 tablets (5-10 mg total) by mouth at bedtime as needed for sleep. 90 tablet 1   No facility-administered medications prior to visit.    ROS: Review of Systems  Constitutional:  Negative for activity change, appetite change,  chills, fatigue and unexpected weight change.  HENT:  Negative for congestion, mouth sores and sinus pressure.   Eyes:  Negative for visual disturbance.  Respiratory:  Negative for cough and chest tightness.   Gastrointestinal:  Negative for abdominal pain and nausea.  Genitourinary:  Negative for difficulty urinating, frequency and vaginal pain.  Musculoskeletal:  Negative for back pain and gait problem.  Skin:  Negative for pallor and rash.  Neurological:  Negative for dizziness, tremors, weakness, numbness and headaches.  Psychiatric/Behavioral:  Negative for confusion, sleep disturbance and suicidal ideas. The patient is not nervous/anxious.     Objective:  BP 130/68 (BP Location: Left Arm, Patient Position: Sitting, Cuff Size: Normal)   Pulse 70   Temp 98 F (36.7 C) (Oral)   Ht '5\' 1"'$  (1.549 m)   Wt 139 lb (63 kg)   SpO2 98%   BMI 26.26 kg/m   BP Readings from Last 3 Encounters:  11/13/21 130/68  05/15/21 134/70  02/12/21 132/62    Wt Readings from Last 3 Encounters:  11/13/21 139 lb (63 kg)  05/15/21 138 lb 12.8 oz (63 kg)  02/12/21 136 lb 3.2 oz (61.8 kg)    Physical Exam Constitutional:      General: She is not in acute distress.    Appearance:  Normal appearance. She is well-developed.  HENT:     Head: Normocephalic.     Right Ear: External ear normal.     Left Ear: External ear normal.     Nose: Nose normal.  Eyes:     General:        Right eye: No discharge.        Left eye: No discharge.     Conjunctiva/sclera: Conjunctivae normal.     Pupils: Pupils are equal, round, and reactive to light.  Neck:     Thyroid: No thyromegaly.     Vascular: No JVD.     Trachea: No tracheal deviation.  Cardiovascular:     Rate and Rhythm: Normal rate and regular rhythm.     Heart sounds: Normal heart sounds.  Pulmonary:     Effort: No respiratory distress.     Breath sounds: No stridor. No wheezing.  Abdominal:     General: Bowel sounds are normal. There is no  distension.     Palpations: Abdomen is soft. There is no mass.     Tenderness: There is no abdominal tenderness. There is no guarding or rebound.  Musculoskeletal:        General: No tenderness.     Cervical back: Normal range of motion and neck supple. No rigidity.  Lymphadenopathy:     Cervical: No cervical adenopathy.  Skin:    Findings: No erythema or rash.  Neurological:     Cranial Nerves: No cranial nerve deficit.     Motor: No abnormal muscle tone.     Coordination: Coordination normal.     Deep Tendon Reflexes: Reflexes normal.  Psychiatric:        Behavior: Behavior normal.        Thought Content: Thought content normal.        Judgment: Judgment normal.     Lab Results  Component Value Date   WBC 8.2 05/15/2021   HGB 13.4 05/15/2021   HCT 41.0 05/15/2021   PLT 221.0 05/15/2021   GLUCOSE 101 (H) 05/15/2021   CHOL 195 05/15/2021   TRIG 170.0 (H) 05/15/2021   HDL 60.60 05/15/2021   LDLDIRECT 96.0 04/13/2019   LDLCALC 100 (H) 05/15/2021   ALT 12 05/15/2021   AST 18 05/15/2021   NA 141 05/15/2021   K 4.4 05/15/2021   CL 103 05/15/2021   CREATININE 1.01 05/15/2021   BUN 26 (H) 05/15/2021   CO2 30 05/15/2021   TSH 2.34 05/15/2021   INR 1.0 10/24/2018   HGBA1C 5.6 10/24/2018    VAS US CAROTID (at [facility] and [facility] only)  Result Date: 10/25/2018 Carotid Arterial Duplex Study Indications:  CVA. Risk Factors: Hypertension. Performing Technologist: Abram Sander RVS  Examination Guidelines: A complete evaluation includes B-mode imaging, spectral Doppler, color Doppler, and power Doppler as needed of all accessible portions of each vessel. Bilateral testing is considered an integral part of a complete examination. Limited examinations for reoccurring indications may be performed as noted.  Right Carotid Findings: +----------+--------+--------+--------+------------+--------+           PSV cm/sEDV cm/sStenosisDescribe    Comments  +----------+--------+--------+--------+------------+--------+ CCA Prox  110     19              heterogenous         +----------+--------+--------+--------+------------+--------+ CCA Distal103     19              heterogenous         +----------+--------+--------+--------+------------+--------+ ICA Prox  73      17      1-39%   heterogenous         +----------+--------+--------+--------+------------+--------+ ICA Distal106     24                                   +----------+--------+--------+--------+------------+--------+ ECA       94      6                                    +----------+--------+--------+--------+------------+--------+ +----------+--------+-------+--------+-------------------+           PSV cm/sEDV cmsDescribeArm Pressure (mmHG) +----------+--------+-------+--------+-------------------+ [id number]                                        +----------+--------+-------+--------+-------------------+ +---------+--------+--+--------+--+---------+ VertebralPSV cm/s37EDV cm/s10Antegrade +---------+--------+--+--------+--+---------+  Left Carotid Findings: +----------+--------+--------+--------+------------+--------+           PSV cm/sEDV cm/sStenosisDescribe    Comments +----------+--------+--------+--------+------------+--------+ CCA Prox  132     14              heterogenous         +----------+--------+--------+--------+------------+--------+ CCA Distal100     17              heterogenous         +----------+--------+--------+--------+------------+--------+ ICA Prox  107     25      1-39%   heterogenous         +----------+--------+--------+--------+------------+--------+ ICA Distal82      13                                   +----------+--------+--------+--------+------------+--------+ ECA       80                                           +----------+--------+--------+--------+------------+--------+  +----------+--------+--------+--------+-------------------+ SubclavianPSV cm/sEDV cm/sDescribeArm Pressure (mmHG) +----------+--------+--------+--------+-------------------+           138                                         +----------+--------+--------+--------+-------------------+ +---------+--------+--+--------+--+---------+ VertebralPSV cm/s67EDV cm/s17Antegrade +---------+--------+--+--------+--+---------+  Summary: Right Carotid: Velocities in the right ICA are consistent with a 1-39% stenosis. Left Carotid: Velocities in the left ICA are consistent with a 1-39% stenosis. Vertebrals: Bilateral vertebral arteries demonstrate antegrade flow. *See table(s) above for measurements and observations.  Electronically signed by Antony Contras MD on 10/25/2018 at 12:23:35 PM.    Final    ECHOCARDIOGRAM COMPLETE  Result Date: 10/24/2018   ECHOCARDIOGRAM REPORT   Patient Name:   LADYE MACNAUGHTON Date of Exam: 10/24/2018 Medical Rec #:  [id number]           Height:       61.0 in Accession #:    [id number]          Weight:       135.0 lb Date of Birth:  1933/01/18            BSA:  1.60 m Patient Age:    54 years            BP:           118/57 mmHg Patient Gender: F                   HR:           75 bpm. Exam Location:  Inpatient  Procedure: 2D Echo Indications:    Stroke 434.91  History:        Patient has no prior history of Echocardiogram examinations.                 Signs/Symptoms: Murmur Risk Factors: Hypertension and                 Dyslipidemia.  Sonographer:    Mikki Santee RDCS (AE) Referring Phys: [place]  1. The left ventricle has hyperdynamic systolic function, with an ejection fraction of >65%. The cavity size was normal. Left ventricular diastolic Doppler parameters are consistent with impaired relaxation.  2. The right ventricle has normal systolic function. The cavity was normal. There is no increase in right ventricular wall thickness.  3. Left  atrial size was mildly dilated.  4. The mitral valve is abnormal. Mild thickening of the mitral valve leaflet. There is moderate mitral annular calcification present.  5. The aortic valve is tricuspid. Mild thickening of the aortic valve. Moderate calcification of the aortic valve.  6. The interatrial septum was not assessed. FINDINGS  Left Ventricle: The left ventricle has hyperdynamic systolic function, with an ejection fraction of >65%. The cavity size was normal. The left ventricular wall thickness was not assessed. Left ventricular diastolic Doppler parameters are consistent with  impaired relaxation. Right Ventricle: The right ventricle has normal systolic function. The cavity was normal. There is no increase in right ventricular wall thickness. Left Atrium: Left atrial size was mildly dilated. Right Atrium: Right atrial size was normal in size. Right atrial pressure is estimated at 10 mmHg. Interatrial Septum: The interatrial septum was not assessed. Pericardium: There is no evidence of pericardial effusion. Mitral Valve: The mitral valve is abnormal. Mild thickening of the mitral valve leaflet. There is moderate mitral annular calcification present. Mitral valve regurgitation is trivial by color flow Doppler. Tricuspid Valve: The tricuspid valve is normal in structure. Tricuspid valve regurgitation is trivial by color flow Doppler. Aortic Valve: The aortic valve is tricuspid Mild thickening of the aortic valve. Moderate calcification of the aortic valve. Aortic valve regurgitation was not visualized by color flow Doppler. Pulmonic Valve: The pulmonic valve was normal in structure. Pulmonic valve regurgitation is not visualized by color flow Doppler. Venous: The inferior vena cava is normal in size with greater than 50% respiratory variability.  +--------------+--------++ LEFT VENTRICLE         +----------------+---------++ +--------------+--------++ Diastology                PLAX 2D                 +----------------+---------++ +--------------+--------++ LV e' lateral:  5.98 cm/s LVIDd:        3.60 cm  +----------------+---------++ +--------------+--------++ LV E/e' lateral:18.6      LVIDs:        2.40 cm  +----------------+---------++ +--------------+--------++ LV e' medial:   4.90 cm/s LV PW:        1.00 cm  +----------------+---------++ +--------------+--------++ LV E/e' medial: 22.7      LV IVS:  1.10 cm  +----------------+---------++ +--------------+--------++ LVOT diam:    2.00 cm  +--------------+--------++ LV SV:        34 ml    +--------------+--------++ LV SV Index:  20.93    +--------------+--------++ LVOT Area:    3.14 cm +--------------+--------++                        +--------------+--------++ +---------------+----------++ RIGHT VENTRICLE           +---------------+----------++ RV S prime:    12.10 cm/s +---------------+----------++ TAPSE (M-mode):2.2 cm     +---------------+----------++ RVSP:          24.2 mmHg  +---------------+----------++ +---------------+-------++-----------++ LEFT ATRIUM           Index       +---------------+-------++-----------++ LA diam:       2.80 cm1.75 cm/m  +---------------+-------++-----------++ LA Vol (A2C):  51.5 ml32.22 ml/m +---------------+-------++-----------++ LA Vol (A4C):  56.7 ml35.48 ml/m +---------------+-------++-----------++ LA Biplane Vol:57.8 ml36.17 ml/m +---------------+-------++-----------++ +------------+---------++-----------++ RIGHT ATRIUM         Index       +------------+---------++-----------++ RA Pressure:3.00 mmHg            +------------+---------++-----------++ RA Area:    11.30 cm            +------------+---------++-----------++ RA Volume:  24.70 ml 15.45 ml/m +------------+---------++-----------++  +------------+-----------++ AORTIC VALVE            +------------+-----------++ LVOT Vmax:   122.00 cm/s +------------+-----------++ LVOT Vmean: 86.500 cm/s +------------+-----------++ LVOT VTI:   0.330 m     +------------+-----------++  +-------------+-------++ AORTA                +-------------+-------++ Ao Root diam:3.00 cm +-------------+-------++ +--------------+-----------++ +---------------+-----------++ MITRAL VALVE              TRICUSPID VALVE            +--------------+-----------++ +---------------+-----------++ MV Area (PHT):2.08 cm    TR Peak grad:  21.2 mmHg   +--------------+-----------++ +---------------+-----------++ MV PHT:       106.00 msec TR Vmax:       230.00 cm/s +--------------+-----------++ +---------------+-----------++ +--------------+-----------++ Estimated RAP: 3.00 mmHg   MV E velocity:111.00 cm/s +---------------+-----------++ +--------------+-----------++ RVSP:          24.2 mmHg   MV A velocity:156.00 cm/s +---------------+-----------++ +--------------+-----------++ MV E/A ratio: 0.71        +--------------+-------+ +--------------+-----------++ SHUNTS                                              +--------------+-------+                               Systemic VTI: 0.33 m                                +--------------+-------+                               Systemic Diam:2.00 cm                               +--------------+-------+  Dorris Carnes MD Electronically signed by Dorris Carnes MD Signature  Date/Time: 10/24/2018/1:35:21 PM    Final    MR BRAIN WO CONTRAST  Result Date: 10/24/2018 CLINICAL DATA:  Initial evaluation for new onset right-sided arm and leg weakness. EXAM: MRI HEAD WITHOUT CONTRAST MRA HEAD WITHOUT CONTRAST TECHNIQUE: Multiplanar, multiecho pulse sequences of the brain and surrounding structures were obtained without intravenous contrast. Angiographic images of the head were obtained using MRA technique without contrast. COMPARISON:  Prior CT from earlier the same day. FINDINGS: MRI  HEAD FINDINGS Brain: Cerebral volume within normal limits for age. Minimal T2/FLAIR hyperintensity noted within the periventricular white matter, nonspecific, and felt to be within normal limits for age. 7 mm focus of diffusion abnormality seen at the mid-posterior left centrum semi ovale (series 5, image 80). No associated hemorrhage. No other evidence for acute or subacute ischemia. Gray-white matter differentiation otherwise maintained. No other areas of remote cortical infarction. No foci of susceptibility artifact to suggest acute or chronic intracranial hemorrhage. No mass lesion, midline shift or mass effect. No hydrocephalus. No extra-axial fluid collection. Pituitary gland suprasellar region normal. Midline structures intact. Vascular: Major intracranial vascular flow voids are maintained. Skull and upper cervical spine: Craniocervical junction normal. Bone marrow signal intensity within normal limits. Hyperostosis frontalis interna noted. Scalp soft tissues unremarkable. Sinuses/Orbits: Patient status post bilateral ocular lens replacement. Globes orbital soft tissues demonstrate no acute finding. Paranasal sinuses are clear. No mastoid effusion. Inner ear structures normal. Other: None. MRA HEAD FINDINGS ANTERIOR CIRCULATION: Distal cervical segments of the internal carotid arteries are patent with symmetric antegrade flow. Petrous segments widely patent bilaterally. Scattered atheromatous irregularity within the cavernous/supraclinoid ICAs. No significant stenosis seen on the right. There is a short-segment severe stenosis at the anterior genu of the cavernous left ICA (series 9, image 104). ICA termini well perfused. A1 segments patent bilaterally. Normal anterior communicating artery. Anterior cerebral arteries widely patent to their distal aspects. M1 segments widely patent bilaterally. Normal MCA bifurcations. Distal MCA branches well perfused and symmetric. Mild distal small vessel atheromatous  irregularity. POSTERIOR CIRCULATION: Vertebral arteries patent to the vertebrobasilar junction without stenosis. Left vertebral artery dominant. Posterior inferior cerebral arteries patent bilaterally. Basilar patent to its distal aspect without stenosis. Superior cerebral arteries patent bilaterally. Both of the posterior cerebral arteries primarily supplied via the basilar and are well perfused to their distal aspects. No intracranial aneurysm. IMPRESSION: MRI HEAD IMPRESSION: 1. 7 mm acute ischemic nonhemorrhagic infarct involving the deep white matter of the mid-posterior left centrum semi ovale. 2. Otherwise normal brain MRI for age. MRA HEAD IMPRESSION: 1. Negative intracranial MRA for large vessel occlusion. 2. Short-segment severe cavernous left ICA stenosis. 3. Otherwise negative intracranial MRA. No other hemodynamically significant or correctable stenosis. Electronically Signed   By: Jeannine Boga M.D.   On: 10/24/2018 06:59   MR MRA HEAD WO CONTRAST  Result Date: 10/24/2018 CLINICAL DATA:  Initial evaluation for new onset right-sided arm and leg weakness. EXAM: MRI HEAD WITHOUT CONTRAST MRA HEAD WITHOUT CONTRAST TECHNIQUE: Multiplanar, multiecho pulse sequences of the brain and surrounding structures were obtained without intravenous contrast. Angiographic images of the head were obtained using MRA technique without contrast. COMPARISON:  Prior CT from earlier the same day. FINDINGS: MRI HEAD FINDINGS Brain: Cerebral volume within normal limits for age. Minimal T2/FLAIR hyperintensity noted within the periventricular white matter, nonspecific, and felt to be within normal limits for age. 7 mm focus of diffusion abnormality seen at the mid-posterior left centrum semi ovale (series 5, image 80). No associated hemorrhage. No other evidence for  acute or subacute ischemia. Gray-white matter differentiation otherwise maintained. No other areas of remote cortical infarction. No foci of susceptibility  artifact to suggest acute or chronic intracranial hemorrhage. No mass lesion, midline shift or mass effect. No hydrocephalus. No extra-axial fluid collection. Pituitary gland suprasellar region normal. Midline structures intact. Vascular: Major intracranial vascular flow voids are maintained. Skull and upper cervical spine: Craniocervical junction normal. Bone marrow signal intensity within normal limits. Hyperostosis frontalis interna noted. Scalp soft tissues unremarkable. Sinuses/Orbits: Patient status post bilateral ocular lens replacement. Globes orbital soft tissues demonstrate no acute finding. Paranasal sinuses are clear. No mastoid effusion. Inner ear structures normal. Other: None. MRA HEAD FINDINGS ANTERIOR CIRCULATION: Distal cervical segments of the internal carotid arteries are patent with symmetric antegrade flow. Petrous segments widely patent bilaterally. Scattered atheromatous irregularity within the cavernous/supraclinoid ICAs. No significant stenosis seen on the right. There is a short-segment severe stenosis at the anterior genu of the cavernous left ICA (series 9, image 104). ICA termini well perfused. A1 segments patent bilaterally. Normal anterior communicating artery. Anterior cerebral arteries widely patent to their distal aspects. M1 segments widely patent bilaterally. Normal MCA bifurcations. Distal MCA branches well perfused and symmetric. Mild distal small vessel atheromatous irregularity. POSTERIOR CIRCULATION: Vertebral arteries patent to the vertebrobasilar junction without stenosis. Left vertebral artery dominant. Posterior inferior cerebral arteries patent bilaterally. Basilar patent to its distal aspect without stenosis. Superior cerebral arteries patent bilaterally. Both of the posterior cerebral arteries primarily supplied via the basilar and are well perfused to their distal aspects. No intracranial aneurysm. IMPRESSION: MRI HEAD IMPRESSION: 1. 7 mm acute ischemic  nonhemorrhagic infarct involving the deep white matter of the mid-posterior left centrum semi ovale. 2. Otherwise normal brain MRI for age. MRA HEAD IMPRESSION: 1. Negative intracranial MRA for large vessel occlusion. 2. Short-segment severe cavernous left ICA stenosis. 3. Otherwise negative intracranial MRA. No other hemodynamically significant or correctable stenosis. Electronically Signed   By: Jeannine Boga M.D.   On: 10/24/2018 06:59   CT Head Wo Contrast  Result Date: 10/24/2018 CLINICAL DATA:  Right upper extremity heaviness and lower extremity numbness EXAM: CT HEAD WITHOUT CONTRAST TECHNIQUE: Contiguous axial images were obtained from the base of the skull through the vertex without intravenous contrast. COMPARISON:  None. FINDINGS: Brain: No acute territorial infarction, hemorrhage, or intracranial mass. The ventricles are nonenlarged. Vascular: No hyperdense vessels.  Carotid vascular calcification. Skull: Normal. Negative for fracture or focal lesion. Sinuses/Orbits: No acute finding. Other: Chronic appearing abnormality at the anterior arch of C1, incompletely visualized. Prominent degenerative changes at the C1-C2 articulation. IMPRESSION: Negative non contrasted CT appearance of the brain.  Mild atrophy Electronically Signed   By: Donavan Foil M.D.   On: 10/24/2018 01:00   DG Chest Portable 1 View  Result Date: 10/24/2018 CLINICAL DATA:  TIA EXAM: PORTABLE CHEST 1 VIEW COMPARISON:  08/14/2017 FINDINGS: The heart size and mediastinal contours are within normal limits. Both lungs are clear. Surgical clips over the axilla bilaterally. Aortic atherosclerosis. IMPRESSION: No active disease. Electronically Signed   By: Donavan Foil M.D.   On: 10/24/2018 00:43    Assessment & Plan:   Problem List Items Addressed This Visit     Essential hypertension - Primary (Chronic)    Cont on  Amlodipine, Losartan HCT      Relevant Medications   ezetimibe (ZETIA) 10 MG tablet   Other  Relevant Orders   CBC with Differential/Platelet   Comprehensive metabolic panel   Depression    Coping  ok w/grief      Dyslipidemia    Cont on Zetia       Relevant Medications   ezetimibe (ZETIA) 10 MG tablet   Grief    Coping well      Hemiparesis as late effect of cerebrovascular accident (CVA) ([place])    Doing well  Cont to exercise      Relevant Orders   CBC with Differential/Platelet   Comprehensive metabolic panel   History of lacunar cerebrovascular accident (CVA)    continue with Plavix      Relevant Orders   CBC with Differential/Platelet   Comprehensive metabolic panel   Osteoporosis    On vit D         Meds ordered this encounter  Medications   ezetimibe (ZETIA) 10 MG tablet    Sig: Take 1 tablet (10 mg total) by mouth daily.    Dispense:  90 tablet    Refill:  3      Follow-up: Return in about 6 months (around 05/15/2022).  Walker Kehr, MD

## 2021-11-13 NOTE — Assessment & Plan Note (Signed)
Cont on Zetia

## 2021-11-13 NOTE — Assessment & Plan Note (Signed)
Doing well  Cont to exercise

## 2021-11-13 NOTE — Assessment & Plan Note (Signed)
Coping well. 

## 2021-11-13 NOTE — Assessment & Plan Note (Signed)
continue with Plavix

## 2021-11-13 NOTE — Assessment & Plan Note (Signed)
On vit D 

## 2021-11-13 NOTE — Assessment & Plan Note (Signed)
Coping ok w/grief

## 2021-11-13 NOTE — Assessment & Plan Note (Signed)
Cont on  Amlodipine, Losartan HCT

## 2021-11-22 ENCOUNTER — Ambulatory Visit: Payer: Medicare Other

## 2021-11-29 ENCOUNTER — Ambulatory Visit (INDEPENDENT_AMBULATORY_CARE_PROVIDER_SITE_OTHER): Payer: Medicare Other

## 2021-11-29 VITALS — Ht 61.0 in | Wt 139.0 lb

## 2021-11-29 DIAGNOSIS — Z0000 Encounter for general adult medical examination without abnormal findings: Secondary | ICD-10-CM | POA: Diagnosis not present

## 2021-11-29 NOTE — Patient Instructions (Signed)
Cynthia Garza , Thank you for taking time to come for your Medicare Wellness Visit. I appreciate your ongoing commitment to your health goals. Please review the following plan we discussed and let me know if I can assist you in the future.   Screening recommendations/referrals: Colonoscopy: not required Mammogram: completed 06/04/2021, due 06/05/2022 Bone Density: completed 03/30/2018 Recommended yearly ophthalmology/optometry visit for glaucoma screening and checkup Recommended yearly dental visit for hygiene and checkup  Vaccinations: Influenza vaccine: due 12/24/2021 Pneumococcal vaccine: completed 10/23/2015 Tdap vaccine: completed 12/18/2015, due 12/17/2025 Shingles vaccine: discussed   Covid-19: 02/10/2020, 08/08/2019, 07/07/2019  Advanced directives: Please bring a copy of your POA (Power of Attorney) and/or Living Will to your next appointment.   Conditions/risks identified: none  Next appointment: Follow up in one year for your annual wellness visit    Preventive Care 65 Years and Older, Female Preventive care refers to lifestyle choices and visits with your health care provider that can promote health and wellness. What does preventive care include? A yearly physical exam. This is also called an annual well check. Dental exams once or twice a year. Routine eye exams. Ask your health care provider how often you should have your eyes checked. Personal lifestyle choices, including: Daily care of your teeth and gums. Regular physical activity. Eating a healthy diet. Avoiding tobacco and drug use. Limiting alcohol use. Practicing safe sex. Taking low-dose aspirin every day. Taking vitamin and mineral supplements as recommended by your health care provider. What happens during an annual well check? The services and screenings done by your health care provider during your annual well check will depend on your age, overall health, lifestyle risk factors, and family history of  disease. Counseling  Your health care provider may ask you questions about your: Alcohol use. Tobacco use. Drug use. Emotional well-being. Home and relationship well-being. Sexual activity. Eating habits. History of falls. Memory and ability to understand (cognition). Work and work Statistician. Reproductive health. Screening  You may have the following tests or measurements: Height, weight, and BMI. Blood pressure. Lipid and cholesterol levels. These may be checked every 5 years, or more frequently if you are over 68 years old. Skin check. Lung cancer screening. You may have this screening every year starting at age 89 if you have a 30-pack-year history of smoking and currently smoke or have quit within the past 15 years. Fecal occult blood test (FOBT) of the stool. You may have this test every year starting at age 71. Flexible sigmoidoscopy or colonoscopy. You may have a sigmoidoscopy every 5 years or a colonoscopy every 10 years starting at age 11. Hepatitis C blood test. Hepatitis B blood test. Sexually transmitted disease (STD) testing. Diabetes screening. This is done by checking your blood sugar (glucose) after you have not eaten for a while (fasting). You may have this done every 1-3 years. Bone density scan. This is done to screen for osteoporosis. You may have this done starting at age 10. Mammogram. This may be done every 1-2 years. Talk to your health care provider about how often you should have regular mammograms. Talk with your health care provider about your test results, treatment options, and if necessary, the need for more tests. Vaccines  Your health care provider may recommend certain vaccines, such as: Influenza vaccine. This is recommended every year. Tetanus, diphtheria, and acellular pertussis (Tdap, Td) vaccine. You may need a Td booster every 10 years. Zoster vaccine. You may need this after age 75. Pneumococcal 13-valent conjugate (PCV13) vaccine.  One  dose is recommended after age 6. Pneumococcal polysaccharide (PPSV23) vaccine. One dose is recommended after age 77. Talk to your health care provider about which screenings and vaccines you need and how often you need them. This information is not intended to replace advice given to you by your health care provider. Make sure you discuss any questions you have with your health care provider. Document Released: 06/08/2015 Document Revised: 01/30/2016 Document Reviewed: 03/13/2015 Elsevier Interactive Patient Education  2017 [place] Prevention in the Home Falls can cause injuries. They can happen to people of all ages. There are many things you can do to make your home safe and to help prevent falls. What can I do on the outside of my home? Regularly fix the edges of walkways and driveways and fix any cracks. Remove anything that might make you trip as you walk through a door, such as a raised step or threshold. Trim any bushes or trees on the path to your home. Use bright outdoor lighting. Clear any walking paths of anything that might make someone trip, such as rocks or tools. Regularly check to see if handrails are loose or broken. Make sure that both sides of any steps have handrails. Any raised decks and porches should have guardrails on the edges. Have any leaves, snow, or ice cleared regularly. Use sand or salt on walking paths during winter. Clean up any spills in your garage right away. This includes oil or grease spills. What can I do in the bathroom? Use night lights. Install grab bars by the toilet and in the tub and shower. Do not use towel bars as grab bars. Use non-skid mats or decals in the tub or shower. If you need to sit down in the shower, use a plastic, non-slip stool. Keep the floor dry. Clean up any water that spills on the floor as soon as it happens. Remove soap buildup in the tub or shower regularly. Attach bath mats securely with double-sided  non-slip rug tape. Do not have throw rugs and other things on the floor that can make you trip. What can I do in the bedroom? Use night lights. Make sure that you have a light by your bed that is easy to reach. Do not use any sheets or blankets that are too big for your bed. They should not hang down onto the floor. Have a firm chair that has side arms. You can use this for support while you get dressed. Do not have throw rugs and other things on the floor that can make you trip. What can I do in the kitchen? Clean up any spills right away. Avoid walking on wet floors. Keep items that you use a lot in easy-to-reach places. If you need to reach something above you, use a strong step stool that has a grab bar. Keep electrical cords out of the way. Do not use floor polish or wax that makes floors slippery. If you must use wax, use non-skid floor wax. Do not have throw rugs and other things on the floor that can make you trip. What can I do with my stairs? Do not leave any items on the stairs. Make sure that there are handrails on both sides of the stairs and use them. Fix handrails that are broken or loose. Make sure that handrails are as long as the stairways. Check any carpeting to make sure that it is firmly attached to the stairs. Fix any carpet that is loose or  worn. Avoid having throw rugs at the top or bottom of the stairs. If you do have throw rugs, attach them to the floor with carpet tape. Make sure that you have a light switch at the top of the stairs and the bottom of the stairs. If you do not have them, ask someone to add them for you. What else can I do to help prevent falls? Wear shoes that: Do not have high heels. Have rubber bottoms. Are comfortable and fit you well. Are closed at the toe. Do not wear sandals. If you use a stepladder: Make sure that it is fully opened. Do not climb a closed stepladder. Make sure that both sides of the stepladder are locked into place. Ask  someone to hold it for you, if possible. Clearly mark and make sure that you can see: Any grab bars or handrails. First and last steps. Where the edge of each step is. Use tools that help you move around (mobility aids) if they are needed. These include: Canes. Walkers. Scooters. Crutches. Turn on the lights when you go into a dark area. Replace any light bulbs as soon as they burn out. Set up your furniture so you have a clear path. Avoid moving your furniture around. If any of your floors are uneven, fix them. If there are any pets around you, be aware of where they are. Review your medicines with your doctor. Some medicines can make you feel dizzy. This can increase your chance of falling. Ask your doctor what other things that you can do to help prevent falls. This information is not intended to replace advice given to you by your health care provider. Make sure you discuss any questions you have with your health care provider. Document Released: 03/08/2009 Document Revised: 10/18/2015 Document Reviewed: 06/16/2014 Elsevier Interactive Patient Education  2017 [employer].

## 2021-11-29 NOTE — Progress Notes (Signed)
I connected with Cynthia Garza today by telephone and verified that I am speaking with the correct person using two identifiers. Location patient: home Location provider: work Persons participating in the virtual visit: Sharran, [place] LPN.   I discussed the limitations, risks, security and privacy concerns of performing an evaluation and management service by telephone and the availability of in person appointments. I also discussed with the patient that there may be a patient responsible charge related to this service. The patient expressed understanding and verbally consented to this telephonic visit.    Interactive audio and video telecommunications were attempted between this provider and patient, however failed, due to patient having technical difficulties OR patient did not have access to video capability.  We continued and completed visit with audio only.     Vital signs may be patient reported or missing.  Subjective:   Cynthia Garza is a 88 y.o. female who presents for Medicare Annual (Subsequent) preventive examination.  Review of Systems     Cardiac Risk Factors include: advanced age (>72mn, >>15women);dyslipidemia;hypertension     Objective:    Today's Vitals   11/29/21 1331  Weight: 139 lb (63 kg)  Height: '5\' 1"'  (1.549 m)   Body mass index is 26.26 kg/m.     11/29/2021    1:34 PM 11/12/2020    3:31 PM 10/12/2019   12:26 PM 10/24/2018   12:17 AM 01/14/2017   12:02 PM 07/17/2015    2:41 PM 07/18/2014    1:59 PM  Advanced Directives  Does Patient Have a Medical Advance Directive? Yes Yes Yes Yes No Yes Yes  Type of AParamedicof AGarberLiving will   HIrrigon[place] will     Does patient want to make changes to medical advance directive?  No - Patient declined No - Patient declined No - Patient declined     Copy of HRockwellin Chart? No - copy requested   No - copy requested        Current Medications (verified) Outpatient Encounter Medications as of 11/29/2021  Medication Sig   amLODipine (NORVASC) 5 MG tablet TAKE 1 TABLET BY MOUTH EVERY DAY   BIOTIN PO Take by mouth.   buPROPion (WELLBUTRIN XL) 150 MG 24 hr tablet TAKE 1 TABLET BY MOUTH DAILY   Cholecalciferol (VITAMIN D3) 50 MCG (2000 UT) capsule Take 1 capsule (2,000 Units total) by mouth daily.   clopidogrel (PLAVIX) 75 MG tablet Take 1 tablet (75 mg total) by mouth daily.   ezetimibe (ZETIA) 10 MG tablet Take 1 tablet (10 mg total) by mouth daily.   Famotidine (PEPCID PO) Take by mouth.   losartan-hydrochlorothiazide (HYZAAR) 100-25 MG tablet Take 1 tablet by mouth daily.   meclizine (ANTIVERT) 12.5 MG tablet Take 1 tablet (12.5 mg total) by mouth 2 (two) times daily as needed for dizziness.   Multiple Vitamins-Minerals (ALIVE ONCE DAILY WOMENS 50+) TABS Take 1 tablet by mouth daily.   Multiple Vitamins-Minerals (PRESERVISION AREDS 2 PO) Take 1 tablet by mouth 2 (two) times daily.   Propylene Glycol 0.6 % SOLN Place 2 drops into both eyes at bedtime.   vitamin B-12 (CYANOCOBALAMIN) 1000 MCG tablet Take 1,000 mcg by mouth daily.   No facility-administered encounter medications on file as of 11/29/2021.    Allergies (verified) Cholestoff [plant sterols and stanols], Lexapro [escitalopram oxalate], Simvastatin, and Diflucan [fluconazole]   History: Past Medical History:  Diagnosis Date   Anxiety  Breast cancer ([facility]) 2015   ER+/PR+/Her2-   Depression    Dyslipidemia    GERD (gastroesophageal reflux disease)    Heart murmur    Hemangioma of intra-abdominal structures    HTN (hypertension)    Insomnia    LBP (low back pain)    Osteoporosis    Paresthesia    Radiation 08/08/13-08/29/13   Bilat.breast 42.72 Gy   Renal cyst    Shingles    Past Surgical History:  Procedure Laterality Date   ABDOMINAL HYSTERECTOMY     ?1970's   BREAST LUMPECTOMY WITH NEEDLE LOCALIZATION AND AXILLARY SENTINEL LYMPH  NODE BX Bilateral 07/07/2013   Procedure: BREAST LUMPECTOMY WITH NEEDLE LOCALIZATION AND AXILLARY SENTINEL LYMPH NODE BIOPIES;  Surgeon: Merrie Roof, MD;  Location: [facility];  Service: General;  Laterality: Bilateral;   Family History  Problem Relation Age of Onset   Kidney failure Mother    Hypertension Father    Esophageal cancer Father    Coronary artery disease Neg Hx    Social History   Socioeconomic History   Marital status: Widowed    Spouse name: Not on file   Number of children: 2   Years of education: Not on file   Highest education level: Not on file  Occupational History   Occupation: Retired    Fish farm manager: RETIRED    Comment: [employer]  Tobacco Use   Smoking status: Never   Smokeless tobacco: Never  Vaping Use   Vaping Use: Never used  Substance and Sexual Activity   Alcohol use: No   Drug use: No   Sexual activity: Not Currently    Comment: menarche age 85, first live birth 28.5 yrs of age, P27, menopause 62, no HRT  Other Topics Concern   Not on file  Social History Narrative   Not on file   Social Determinants of Health   Financial Resource Strain: Low Risk  (11/29/2021)   Overall Financial Resource Strain (CARDIA)    Difficulty of Paying Living Expenses: Not hard at all  Food Insecurity: No Food Insecurity (11/29/2021)   Hunger Vital Sign    Worried About Running Out of Food in the Last Year: Never true    [place] in the Last Year: Never true  Transportation Needs: No Transportation Needs (11/29/2021)   PRAPARE - Hydrologist (Medical): No    Lack of Transportation (Non-Medical): No  Physical Activity: Inactive (11/29/2021)   Exercise Vital Sign    Days of Exercise per Week: 0 days    Minutes of Exercise per Session: 0 min  Stress: No Stress Concern Present (11/29/2021)   [place]    Feeling of Stress : Not at all  Social Connections: Moderately  Integrated (11/12/2020)   Social Connection and Isolation Panel [NHANES]    Frequency of Communication with Friends and Family: More than three times a week    Frequency of Social Gatherings with Friends and Family: More than three times a week    Attends Religious Services: More than 4 times per year    Active Member of [employer] or Organizations: Yes    Attends Archivist Meetings: More than 4 times per year    Marital Status: Widowed    Tobacco Counseling Counseling given: Not Answered   Clinical Intake:  Pre-visit preparation completed: Yes  Pain : No/denies pain     Nutritional Status: BMI 25 -29 Overweight  Nutritional Risks: None Diabetes: No  How often do you need to have someone help you when you read instructions, pamphlets, or other written materials from your doctor or pharmacy?: 1 - Never  Diabetic? no  Interpreter Needed?: No  Information entered by :: NAllen LPN   Activities of Daily Living    11/29/2021    1:35 PM  In your present state of health, do you have any difficulty performing the following activities:  Hearing? 0  Vision? 0  Difficulty concentrating or making decisions? 0  Walking or climbing stairs? 0  Dressing or bathing? 0  Doing errands, shopping? 0  Preparing Food and eating ? N  Using the Toilet? N  In the past six months, have you accidently leaked urine? N  Do you have problems with loss of bowel control? N  Managing your Medications? N  Managing your Finances? N  Housekeeping or managing your Housekeeping? N    Patient Care Team: Plotnikov, Evie Lacks, MD as PCP - General (Internal Medicine) Magrinat, Virgie Dad, MD (Inactive) as Consulting Physician (Oncology) Jovita Kussmaul, MD as Consulting Physician (General Surgery) Thea Silversmith, MD as Referring Physician (Radiation Oncology) Juluis Rainier as Consulting Physician (Optometry)  Indicate any recent Medical Services you may have received from other than [facility] providers  in the past year (date may be approximate).     Assessment:   This is a routine wellness examination for [place].  Hearing/Vision screen Vision Screening - Comments:: Regular eye exams, Dr. Idolina Primer  Dietary issues and exercise activities discussed: Current Exercise Habits: The patient does not participate in regular exercise at present   Goals Addressed             This Visit's Progress    Patient Stated       11/29/2021, stay alive       Depression Screen    11/29/2021    1:35 PM 11/13/2021    9:31 AM 11/12/2020    3:27 PM 01/16/2020    2:55 PM 10/12/2019   12:00 PM 12/29/2017    2:25 PM 12/02/2016    3:11 PM  PHQ 2/9 Scores  PHQ - 2 Score 0 0 1 0 0 0 0  PHQ- 9 Score       3    Fall Risk    11/29/2021    1:35 PM 11/13/2021    9:31 AM 11/12/2020    3:33 PM 11/12/2020    3:32 PM 10/12/2019   12:28 PM  Fall Risk   Falls in the past year? 0 0 0 0 0  Number falls in past yr: 0 0 0 0 0  Injury with Fall? 0 0 0 0 0  Risk for fall due to : Medication side effect No Fall Risks No Fall Risks No Fall Risks Orthopedic patient  Follow up Education provided;Falls evaluation completed;Falls prevention discussed Falls evaluation completed Falls evaluation completed  Falls evaluation completed;Education provided;Falls prevention discussed    FALL RISK PREVENTION PERTAINING TO THE HOME:  Any stairs in or around the home? No  If so, are there any without handrails? N/a Home free of loose throw rugs in walkways, pet beds, electrical cords, etc? Yes  Adequate lighting in your home to reduce risk of falls? Yes   ASSISTIVE DEVICES UTILIZED TO PREVENT FALLS:  Life alert? No  Use of a cane, walker or w/c? No  Grab bars in the bathroom? Yes  Shower chair or bench in shower? No  Elevated toilet seat  or a handicapped toilet? Yes   TIMED UP AND GO:  Was the test performed? No .      Cognitive Function:        11/29/2021    1:36 PM  6CIT Screen  What Year? 0 points  What month? 0  points  What time? 0 points  Count back from 20 0 points  Months in reverse 0 points  Repeat phrase 2 points  Total Score 2 points    Immunizations Immunization History  Administered Date(s) Administered   Fluad Quad(high Dose 65+) 04/13/2019, 05/14/2020, 02/12/2021   Influenza Split 03/26/2012, 04/11/2015   Influenza Whole 03/07/2008, 04/26/2009, 05/13/2010   Influenza, High Dose Seasonal PF 07/21/2016, 03/13/2017, 04/07/2018   Influenza,inj,Quad PF,6+ Mos 02/14/2013, 01/17/2014   Influenza-Unspecified 02/24/2015   Moderna SARS-COV2 Booster Vaccination 02/10/2020   Moderna Sars-Covid-2 Vaccination 07/07/2019, 08/08/2019   Pneumococcal Conjugate-13 05/16/2013   Pneumococcal Polysaccharide-23 02/10/2007, 10/23/2015   Td 12/13/1998   Tdap 12/18/2015   Zoster, Live 07/11/2008    TDAP status: Up to date  Flu Vaccine status: Up to date  Pneumococcal vaccine status: Up to date  Covid-19 vaccine status: Completed vaccines  Qualifies for Shingles Vaccine? Yes   Zostavax completed Yes   Shingrix Completed?: No.    Education has been provided regarding the importance of this vaccine. Patient has been advised to call insurance company to determine out of pocket expense if they have not yet received this vaccine. Advised may also receive vaccine at local pharmacy or Health Dept. Verbalized acceptance and understanding.  Screening Tests Health Maintenance  Topic Date Due   Zoster Vaccines- Shingrix (1 of 2) Never done   COVID-19 Vaccine (3 - Moderna risk series) 03/09/2020   INFLUENZA VACCINE  12/24/2021   TETANUS/TDAP  12/17/2025   Pneumonia Vaccine 53+ Years old  Completed   DEXA SCAN  Completed   HPV VACCINES  Aged Out    Health Maintenance  Health Maintenance Due  Topic Date Due   Zoster Vaccines- Shingrix (1 of 2) Never done   COVID-19 Vaccine (3 - Moderna risk series) 03/09/2020    Colorectal cancer screening: No longer required.   Mammogram status: Completed  06/04/2021. Repeat every year  Bone Density status: Completed 03/30/2018.   Lung Cancer Screening: (Low Dose CT Chest recommended if Age 8-80 years, 30 pack-year currently smoking OR have quit w/in 15years.) does not qualify.   Lung Cancer Screening Referral: no  Additional Screening:  Hepatitis C Screening: does not qualify;   Vision Screening: Recommended annual ophthalmology exams for early detection of glaucoma and other disorders of the eye. Is the patient up to date with their annual eye exam?  Yes  Who is the provider or what is the name of the office in which the patient attends annual eye exams? Dr. Nadara Mustard If pt is not established with a provider, would they like to be referred to a provider to establish care? No .   Dental Screening: Recommended annual dental exams for proper oral hygiene  Community Resource Referral / Chronic Care Management: CRR required this visit?  No   CCM required this visit?  No      Plan:     I have personally reviewed and noted the following in the patient's chart:   Medical and social history Use of alcohol, tobacco or illicit drugs  Current medications and supplements including opioid prescriptions.  Functional ability and status Nutritional status Physical activity Advanced directives List of other physicians Hospitalizations, surgeries,  and ER visits in previous 12 months Vitals Screenings to include cognitive, depression, and falls Referrals and appointments  In addition, I have reviewed and discussed with patient certain preventive protocols, quality metrics, and best practice recommendations. A written personalized care plan for preventive services as well as general preventive health recommendations were provided to patient.     Kellie Simmering, LPN   10/01/4088   Nurse Notes: none  Due to this being a virtual visit, the after visit summary with patients personalized plan was offered to patient via mail or my-chart.  Patient would like to access on my-chart

## 2021-12-12 ENCOUNTER — Other Ambulatory Visit: Payer: Self-pay | Admitting: Internal Medicine

## 2022-01-09 ENCOUNTER — Other Ambulatory Visit: Payer: Self-pay | Admitting: Internal Medicine

## 2022-02-21 ENCOUNTER — Other Ambulatory Visit: Payer: Self-pay | Admitting: Internal Medicine

## 2022-02-28 ENCOUNTER — Other Ambulatory Visit: Payer: Self-pay | Admitting: Internal Medicine

## 2022-03-20 ENCOUNTER — Telehealth: Payer: Medicare Other | Admitting: Emergency Medicine

## 2022-03-20 DIAGNOSIS — J019 Acute sinusitis, unspecified: Secondary | ICD-10-CM | POA: Diagnosis not present

## 2022-03-20 DIAGNOSIS — B9689 Other specified bacterial agents as the cause of diseases classified elsewhere: Secondary | ICD-10-CM | POA: Diagnosis not present

## 2022-03-20 DIAGNOSIS — J208 Acute bronchitis due to other specified organisms: Secondary | ICD-10-CM | POA: Diagnosis not present

## 2022-03-20 MED ORDER — BENZONATATE 100 MG PO CAPS: 100.0000 mg | ORAL_CAPSULE | Freq: Three times a day (TID) | ORAL | 0 refills | Status: DC | PRN

## 2022-03-20 MED ORDER — DOXYCYCLINE HYCLATE 100 MG PO TABS: 100.0000 mg | ORAL_TABLET | Freq: Two times a day (BID) | ORAL | 0 refills | Status: DC

## 2022-03-20 NOTE — Progress Notes (Signed)
Because your questionnaire answers don't match your thoughts on what treatments you need, I feel your condition warrants further evaluation and I recommend that you be seen in a face to face visit. I recommend either a Virtual Urgent Care video appointment or an in-person urgent care visit. You can get a Virtual Urgent Care visit through [facility] or through the [facility] website: [email or web address]    NOTE: There will be NO CHARGE for this eVisit   If you are having a true medical emergency please call 911.      For an urgent face to face visit, [facility] has seven urgent care centers for your convenience:     [facility] [place] Get Driving Directions [phone] [place], [place] [address]    [facility]) Get Driving Directions [phone] [place], [place] [address]  [facility] [place] [facility]) Get Driving Directions [phone] [address] [place],  [place]  [address]  [facility] - at [address] [place] Get Driving Directions  [phone] [address] [place],  [place] [address]   [facility] Get Driving Directions [phone] [address], [place], [place] [address]   [facility] Get Driving Directions  [phone] [address] [place], [place] [address]   [facility] Get Driving Directions [phone] [address], [place], [place] [address]  Your MyChart E-visit questionnaire answers were reviewed by a board certified advanced clinical practitioner to complete your personal care plan based on your specific symptoms.  Thank you for using e-Visits.

## 2022-03-20 NOTE — Progress Notes (Signed)

## 2022-03-20 NOTE — Addendum Note (Signed)
Addended by: Mar Daring on: 03/20/2022 03:22 PM   Modules accepted: Orders

## 2022-03-31 ENCOUNTER — Telehealth: Payer: Medicare Other | Admitting: Internal Medicine

## 2022-04-03 ENCOUNTER — Ambulatory Visit (INDEPENDENT_AMBULATORY_CARE_PROVIDER_SITE_OTHER): Payer: Medicare Other | Admitting: Internal Medicine

## 2022-04-03 ENCOUNTER — Encounter: Payer: Self-pay | Admitting: Internal Medicine

## 2022-04-03 VITALS — BP 136/72 | HR 69 | Temp 98.2°F | Ht 61.0 in | Wt 135.2 lb

## 2022-04-03 DIAGNOSIS — M2550 Pain in unspecified joint: Secondary | ICD-10-CM

## 2022-04-03 DIAGNOSIS — E785 Hyperlipidemia, unspecified: Secondary | ICD-10-CM | POA: Diagnosis not present

## 2022-04-03 DIAGNOSIS — Z23 Encounter for immunization: Secondary | ICD-10-CM

## 2022-04-03 DIAGNOSIS — F32A Depression, unspecified: Secondary | ICD-10-CM

## 2022-04-03 DIAGNOSIS — Z8673 Personal history of transient ischemic attack (TIA), and cerebral infarction without residual deficits: Secondary | ICD-10-CM

## 2022-04-03 LAB — URINALYSIS
Bilirubin Urine: NEGATIVE
Hgb urine dipstick: NEGATIVE
Ketones, ur: NEGATIVE
Leukocytes,Ua: NEGATIVE
Nitrite: NEGATIVE
Specific Gravity, Urine: 1.015 (ref 1.000–1.030)
Total Protein, Urine: NEGATIVE
Urine Glucose: NEGATIVE
Urobilinogen, UA: 0.2 (ref 0.0–1.0)
pH: 7 (ref 5.0–8.0)

## 2022-04-03 LAB — COMPREHENSIVE METABOLIC PANEL
ALT: 9 U/L (ref 0–35)
AST: 14 U/L (ref 0–37)
Albumin: 4.2 g/dL (ref 3.5–5.2)
Alkaline Phosphatase: 56 U/L (ref 39–117)
BUN: 22 mg/dL (ref 6–23)
CO2: 30 mEq/L (ref 19–32)
Calcium: 9.5 mg/dL (ref 8.4–10.5)
Chloride: 103 mEq/L (ref 96–112)
Creatinine, Ser: 0.9 mg/dL (ref 0.40–1.20)
GFR: 57.61 mL/min — ABNORMAL LOW (ref >60.00)
Glucose, Bld: 98 mg/dL (ref 70–99)
Potassium: 3.7 mEq/L (ref 3.5–5.1)
Sodium: 142 mEq/L (ref 135–145)
Total Bilirubin: 0.5 mg/dL (ref 0.2–1.2)
Total Protein: 6.7 g/dL (ref 6.0–8.3)

## 2022-04-03 LAB — CBC WITH DIFFERENTIAL/PLATELET
Basophils Absolute: 0.1 10*3/uL (ref 0.0–0.1)
Basophils Relative: 0.6 % (ref 0.0–3.0)
Eosinophils Absolute: 0.2 10*3/uL (ref 0.0–0.7)
Eosinophils Relative: 2 % (ref 0.0–5.0)
HCT: 39.4 % (ref 36.0–46.0)
Hemoglobin: 13.2 g/dL (ref 12.0–15.0)
Lymphocytes Relative: 27.7 % (ref 12.0–46.0)
Lymphs Abs: 2.7 10*3/uL (ref 0.7–4.0)
MCHC: 33.5 g/dL (ref 30.0–36.0)
MCV: 84.6 fl (ref 78.0–100.0)
Monocytes Absolute: 1 10*3/uL (ref 0.1–1.0)
Monocytes Relative: 10.5 % (ref 3.0–12.0)
Neutro Abs: 5.7 10*3/uL (ref 1.4–7.7)
Neutrophils Relative %: 59.2 % (ref 43.0–77.0)
Platelets: 222 10*3/uL (ref 150.0–400.0)
RBC: 4.65 Mil/uL (ref 3.87–5.11)
RDW: 14.1 % (ref 11.5–15.5)
WBC: 9.6 10*3/uL (ref 4.0–10.5)

## 2022-04-03 LAB — LDL CHOLESTEROL, DIRECT: Direct LDL: 114 mg/dL

## 2022-04-03 LAB — LIPID PANEL
Cholesterol: 192 mg/dL (ref 0–200)
HDL: 61.1 mg/dL (ref >39.00)
NonHDL: 131.16
Total CHOL/HDL Ratio: 3
Triglycerides: 204 mg/dL — ABNORMAL HIGH (ref 0.0–149.0)
VLDL: 40.8 mg/dL — ABNORMAL HIGH (ref 0.0–40.0)

## 2022-04-03 LAB — TSH: TSH: 1.33 u[IU]/mL (ref 0.35–5.50)

## 2022-04-03 NOTE — Progress Notes (Signed)
Subjective:  Patient ID: Cynthia Garza, female    DOB: January 13, 1933  Age: 88 y.o. MRN: [id number]  CC: Follow-up (5 month f/u - Flu shot)   HPI Cynthia Garza presents for HTN, depression, dyslipidemia  Outpatient Medications Prior to Visit  Medication Sig Dispense Refill   amLODipine (NORVASC) 5 MG tablet TAKE 1 TABLET BY MOUTH EVERY DAY 90 tablet 1   BIOTIN PO Take by mouth.     buPROPion (WELLBUTRIN XL) 150 MG 24 hr tablet TAKE 1 TABLET BY MOUTH DAILY 90 tablet 3   Cholecalciferol (VITAMIN D3) 50 MCG (2000 UT) capsule Take 1 capsule (2,000 Units total) by mouth daily. 100 capsule 3   clopidogrel (PLAVIX) 75 MG tablet TAKE 1 TABLET BY MOUTH EVERY DAY 90 tablet 1   ezetimibe (ZETIA) 10 MG tablet Take 1 tablet (10 mg total) by mouth daily. 90 tablet 3   Famotidine (PEPCID PO) Take by mouth.     losartan-hydrochlorothiazide (HYZAAR) 100-25 MG tablet TAKE 1 TABLET BY MOUTH EVERY DAY 90 tablet 3   meclizine (ANTIVERT) 12.5 MG tablet Take 1 tablet (12.5 mg total) by mouth 2 (two) times daily as needed for dizziness. 60 tablet 2   Multiple Vitamins-Minerals (ALIVE ONCE DAILY WOMENS 50+) TABS Take 1 tablet by mouth daily.     Multiple Vitamins-Minerals (PRESERVISION AREDS 2 PO) Take 1 tablet by mouth 2 (two) times daily.     Propylene Glycol 0.6 % SOLN Place 2 drops into both eyes at bedtime.     vitamin B-12 (CYANOCOBALAMIN) 1000 MCG tablet Take 1,000 mcg by mouth daily.     benzonatate (TESSALON) 100 MG capsule Take 1 capsule (100 mg total) by mouth 3 (three) times daily as needed. (Patient not taking: Reported on 04/03/2022) 30 capsule 0   doxycycline (VIBRA-TABS) 100 MG tablet Take 1 tablet (100 mg total) by mouth 2 (two) times daily. (Patient not taking: Reported on 04/03/2022) 20 tablet 0   No facility-administered medications prior to visit.    ROS: Review of Systems  Constitutional:  Negative for activity change, appetite change, chills, fatigue and unexpected weight change.   HENT:  Negative for congestion, mouth sores and sinus pressure.   Eyes:  Negative for visual disturbance.  Respiratory:  Negative for cough and chest tightness.   Gastrointestinal:  Negative for abdominal pain and nausea.  Genitourinary:  Negative for difficulty urinating, frequency and vaginal pain.  Musculoskeletal:  Negative for back pain and gait problem.  Skin:  Negative for pallor and rash.  Neurological:  Negative for dizziness, tremors, weakness, numbness and headaches.  Psychiatric/Behavioral:  Negative for confusion and sleep disturbance.     Objective:  BP 136/72 (BP Location: Left Arm)   Pulse 69   Temp 98.2 F (36.8 C) (Oral)   Ht '5\' 1"'$  (1.549 m)   Wt 135 lb 3.2 oz (61.3 kg)   SpO2 97%   BMI 25.55 kg/m   BP Readings from Last 3 Encounters:  04/03/22 136/72  11/13/21 130/68  05/15/21 134/70    Wt Readings from Last 3 Encounters:  04/03/22 135 lb 3.2 oz (61.3 kg)  11/29/21 139 lb (63 kg)  11/13/21 139 lb (63 kg)    Physical Exam Constitutional:      General: She is not in acute distress.    Appearance: She is well-developed.  HENT:     Head: Normocephalic.     Right Ear: External ear normal.     Left Ear: External ear normal.  Nose: Nose normal.  Eyes:     General:        Right eye: No discharge.        Left eye: No discharge.     Conjunctiva/sclera: Conjunctivae normal.     Pupils: Pupils are equal, round, and reactive to light.  Neck:     Thyroid: No thyromegaly.     Vascular: No JVD.     Trachea: No tracheal deviation.  Cardiovascular:     Rate and Rhythm: Normal rate and regular rhythm.     Heart sounds: Normal heart sounds.  Pulmonary:     Effort: No respiratory distress.     Breath sounds: No stridor. No wheezing.  Abdominal:     General: Bowel sounds are normal. There is no distension.     Palpations: Abdomen is soft. There is no mass.     Tenderness: There is no abdominal tenderness. There is no guarding or rebound.   Musculoskeletal:        General: No tenderness.     Cervical back: Normal range of motion and neck supple. No rigidity.  Lymphadenopathy:     Cervical: No cervical adenopathy.  Skin:    Findings: No erythema or rash.  Neurological:     Mental Status: She is oriented to person, place, and time.     Cranial Nerves: No cranial nerve deficit.     Motor: No abnormal muscle tone.     Coordination: Coordination normal.     Deep Tendon Reflexes: Reflexes normal.  Psychiatric:        Behavior: Behavior normal.        Thought Content: Thought content normal.        Judgment: Judgment normal.    Lab Results  Component Value Date   WBC 8.8 11/13/2021   HGB 13.7 11/13/2021   HCT 41.0 11/13/2021   PLT 232.0 11/13/2021   GLUCOSE 96 11/13/2021   CHOL 195 05/15/2021   TRIG 170.0 (H) 05/15/2021   HDL 60.60 05/15/2021   LDLDIRECT 96.0 04/13/2019   LDLCALC 100 (H) 05/15/2021   ALT 10 11/13/2021   AST 16 11/13/2021   NA 140 11/13/2021   K 3.6 11/13/2021   CL 103 11/13/2021   CREATININE 0.96 11/13/2021   BUN 24 (H) 11/13/2021   CO2 27 11/13/2021   TSH 2.34 05/15/2021   INR 1.0 10/24/2018   HGBA1C 5.6 10/24/2018    VAS US CAROTID (at [facility] and [facility] only)  Result Date: 10/25/2018 Carotid Arterial Duplex Study Indications:  CVA. Risk Factors: Hypertension. Performing Technologist: Abram Sander RVS  Examination Guidelines: A complete evaluation includes B-mode imaging, spectral Doppler, color Doppler, and power Doppler as needed of all accessible portions of each vessel. Bilateral testing is considered an integral part of a complete examination. Limited examinations for reoccurring indications may be performed as noted.  Right Carotid Findings: +----------+--------+--------+--------+------------+--------+           PSV cm/sEDV cm/sStenosisDescribe    Comments +----------+--------+--------+--------+------------+--------+ CCA Prox  110     19              heterogenous          +----------+--------+--------+--------+------------+--------+ CCA Distal103     19              heterogenous         +----------+--------+--------+--------+------------+--------+ ICA Prox  73      17      1-39%   heterogenous         +----------+--------+--------+--------+------------+--------+  ICA Distal106     24                                   +----------+--------+--------+--------+------------+--------+ ECA       94      6                                    +----------+--------+--------+--------+------------+--------+ +----------+--------+-------+--------+-------------------+           PSV cm/sEDV cmsDescribeArm Pressure (mmHG) +----------+--------+-------+--------+-------------------+ [id number]                                        +----------+--------+-------+--------+-------------------+ +---------+--------+--+--------+--+---------+ VertebralPSV cm/s37EDV cm/s10Antegrade +---------+--------+--+--------+--+---------+  Left Carotid Findings: +----------+--------+--------+--------+------------+--------+           PSV cm/sEDV cm/sStenosisDescribe    Comments +----------+--------+--------+--------+------------+--------+ CCA Prox  132     14              heterogenous         +----------+--------+--------+--------+------------+--------+ CCA Distal100     17              heterogenous         +----------+--------+--------+--------+------------+--------+ ICA Prox  107     25      1-39%   heterogenous         +----------+--------+--------+--------+------------+--------+ ICA Distal82      13                                   +----------+--------+--------+--------+------------+--------+ ECA       80                                           +----------+--------+--------+--------+------------+--------+ +----------+--------+--------+--------+-------------------+ SubclavianPSV cm/sEDV cm/sDescribeArm Pressure (mmHG)  +----------+--------+--------+--------+-------------------+           138                                         +----------+--------+--------+--------+-------------------+ +---------+--------+--+--------+--+---------+ VertebralPSV cm/s67EDV cm/s17Antegrade +---------+--------+--+--------+--+---------+  Summary: Right Carotid: Velocities in the right ICA are consistent with a 1-39% stenosis. Left Carotid: Velocities in the left ICA are consistent with a 1-39% stenosis. Vertebrals: Bilateral vertebral arteries demonstrate antegrade flow. *See table(s) above for measurements and observations.  Electronically signed by Antony Contras MD on 10/25/2018 at 12:23:35 PM.    Final    ECHOCARDIOGRAM COMPLETE  Result Date: 10/24/2018   ECHOCARDIOGRAM REPORT   Patient Name:   MIKAEL DEBELL Date of Exam: 10/24/2018 Medical Rec #:  [id number]           Height:       61.0 in Accession #:    [id number]          Weight:       135.0 lb Date of Birth:  1932-07-28            BSA:          1.60 m Patient Age:    55 years  BP:           118/57 mmHg Patient Gender: F                   HR:           75 bpm. Exam Location:  Inpatient  Procedure: 2D Echo Indications:    Stroke 434.91  History:        Patient has no prior history of Echocardiogram examinations.                 Signs/Symptoms: Murmur Risk Factors: Hypertension and                 Dyslipidemia.  Sonographer:    Mikki Santee RDCS (AE) Referring Phys: [place]  1. The left ventricle has hyperdynamic systolic function, with an ejection fraction of >65%. The cavity size was normal. Left ventricular diastolic Doppler parameters are consistent with impaired relaxation.  2. The right ventricle has normal systolic function. The cavity was normal. There is no increase in right ventricular wall thickness.  3. Left atrial size was mildly dilated.  4. The mitral valve is abnormal. Mild thickening of the mitral valve leaflet. There is  moderate mitral annular calcification present.  5. The aortic valve is tricuspid. Mild thickening of the aortic valve. Moderate calcification of the aortic valve.  6. The interatrial septum was not assessed. FINDINGS  Left Ventricle: The left ventricle has hyperdynamic systolic function, with an ejection fraction of >65%. The cavity size was normal. The left ventricular wall thickness was not assessed. Left ventricular diastolic Doppler parameters are consistent with  impaired relaxation. Right Ventricle: The right ventricle has normal systolic function. The cavity was normal. There is no increase in right ventricular wall thickness. Left Atrium: Left atrial size was mildly dilated. Right Atrium: Right atrial size was normal in size. Right atrial pressure is estimated at 10 mmHg. Interatrial Septum: The interatrial septum was not assessed. Pericardium: There is no evidence of pericardial effusion. Mitral Valve: The mitral valve is abnormal. Mild thickening of the mitral valve leaflet. There is moderate mitral annular calcification present. Mitral valve regurgitation is trivial by color flow Doppler. Tricuspid Valve: The tricuspid valve is normal in structure. Tricuspid valve regurgitation is trivial by color flow Doppler. Aortic Valve: The aortic valve is tricuspid Mild thickening of the aortic valve. Moderate calcification of the aortic valve. Aortic valve regurgitation was not visualized by color flow Doppler. Pulmonic Valve: The pulmonic valve was normal in structure. Pulmonic valve regurgitation is not visualized by color flow Doppler. Venous: The inferior vena cava is normal in size with greater than 50% respiratory variability.  +--------------+--------++ LEFT VENTRICLE         +----------------+---------++ +--------------+--------++ Diastology                PLAX 2D                +----------------+---------++ +--------------+--------++ LV e' lateral:  5.98 cm/s LVIDd:        3.60 cm   +----------------+---------++ +--------------+--------++ LV E/e' lateral:18.6      LVIDs:        2.40 cm  +----------------+---------++ +--------------+--------++ LV e' medial:   4.90 cm/s LV PW:        1.00 cm  +----------------+---------++ +--------------+--------++ LV E/e' medial: 22.7      LV IVS:       1.10 cm  +----------------+---------++ +--------------+--------++ LVOT diam:    2.00 cm  +--------------+--------++ LV  SV:        34 ml    +--------------+--------++ LV SV Index:  20.93    +--------------+--------++ LVOT Area:    3.14 cm +--------------+--------++                        +--------------+--------++ +---------------+----------++ RIGHT VENTRICLE           +---------------+----------++ RV S prime:    12.10 cm/s +---------------+----------++ TAPSE (M-mode):2.2 cm     +---------------+----------++ RVSP:          24.2 mmHg  +---------------+----------++ +---------------+-------++-----------++ LEFT ATRIUM           Index       +---------------+-------++-----------++ LA diam:       2.80 cm1.75 cm/m  +---------------+-------++-----------++ LA Vol (A2C):  51.5 ml32.22 ml/m +---------------+-------++-----------++ LA Vol (A4C):  56.7 ml35.48 ml/m +---------------+-------++-----------++ LA Biplane Vol:57.8 ml36.17 ml/m +---------------+-------++-----------++ +------------+---------++-----------++ RIGHT ATRIUM         Index       +------------+---------++-----------++ RA Pressure:3.00 mmHg            +------------+---------++-----------++ RA Area:    11.30 cm            +------------+---------++-----------++ RA Volume:  24.70 ml 15.45 ml/m +------------+---------++-----------++  +------------+-----------++ AORTIC VALVE            +------------+-----------++ LVOT Vmax:  122.00 cm/s +------------+-----------++ LVOT Vmean: 86.500 cm/s +------------+-----------++ LVOT VTI:   0.330 m      +------------+-----------++  +-------------+-------++ AORTA                +-------------+-------++ Ao Root diam:3.00 cm +-------------+-------++ +--------------+-----------++ +---------------+-----------++ MITRAL VALVE              TRICUSPID VALVE            +--------------+-----------++ +---------------+-----------++ MV Area (PHT):2.08 cm    TR Peak grad:  21.2 mmHg   +--------------+-----------++ +---------------+-----------++ MV PHT:       106.00 msec TR Vmax:       230.00 cm/s +--------------+-----------++ +---------------+-----------++ +--------------+-----------++ Estimated RAP: 3.00 mmHg   MV E velocity:111.00 cm/s +---------------+-----------++ +--------------+-----------++ RVSP:          24.2 mmHg   MV A velocity:156.00 cm/s +---------------+-----------++ +--------------+-----------++ MV E/A ratio: 0.71        +--------------+-------+ +--------------+-----------++ SHUNTS                                              +--------------+-------+                               Systemic VTI: 0.33 m                                +--------------+-------+                               Systemic Diam:2.00 cm                               +--------------+-------+  Dorris Carnes MD Electronically signed by Dorris Carnes MD Signature Date/Time: 10/24/2018/1:35:21 PM    Final    MR BRAIN WO CONTRAST  Result Date: 10/24/2018 CLINICAL DATA:  Initial evaluation for new onset right-sided arm and leg weakness. EXAM: MRI HEAD WITHOUT CONTRAST MRA HEAD WITHOUT CONTRAST TECHNIQUE: Multiplanar, multiecho pulse sequences of the brain and surrounding structures were obtained without intravenous contrast. Angiographic images of the head were obtained using MRA technique without contrast. COMPARISON:  Prior CT from earlier the same day. FINDINGS: MRI HEAD FINDINGS Brain: Cerebral volume within normal limits for age. Minimal T2/FLAIR hyperintensity noted within the  periventricular white matter, nonspecific, and felt to be within normal limits for age. 7 mm focus of diffusion abnormality seen at the mid-posterior left centrum semi ovale (series 5, image 80). No associated hemorrhage. No other evidence for acute or subacute ischemia. Gray-white matter differentiation otherwise maintained. No other areas of remote cortical infarction. No foci of susceptibility artifact to suggest acute or chronic intracranial hemorrhage. No mass lesion, midline shift or mass effect. No hydrocephalus. No extra-axial fluid collection. Pituitary gland suprasellar region normal. Midline structures intact. Vascular: Major intracranial vascular flow voids are maintained. Skull and upper cervical spine: Craniocervical junction normal. Bone marrow signal intensity within normal limits. Hyperostosis frontalis interna noted. Scalp soft tissues unremarkable. Sinuses/Orbits: Patient status post bilateral ocular lens replacement. Globes orbital soft tissues demonstrate no acute finding. Paranasal sinuses are clear. No mastoid effusion. Inner ear structures normal. Other: None. MRA HEAD FINDINGS ANTERIOR CIRCULATION: Distal cervical segments of the internal carotid arteries are patent with symmetric antegrade flow. Petrous segments widely patent bilaterally. Scattered atheromatous irregularity within the cavernous/supraclinoid ICAs. No significant stenosis seen on the right. There is a short-segment severe stenosis at the anterior genu of the cavernous left ICA (series 9, image 104). ICA termini well perfused. A1 segments patent bilaterally. Normal anterior communicating artery. Anterior cerebral arteries widely patent to their distal aspects. M1 segments widely patent bilaterally. Normal MCA bifurcations. Distal MCA branches well perfused and symmetric. Mild distal small vessel atheromatous irregularity. POSTERIOR CIRCULATION: Vertebral arteries patent to the vertebrobasilar junction without stenosis. Left  vertebral artery dominant. Posterior inferior cerebral arteries patent bilaterally. Basilar patent to its distal aspect without stenosis. Superior cerebral arteries patent bilaterally. Both of the posterior cerebral arteries primarily supplied via the basilar and are well perfused to their distal aspects. No intracranial aneurysm. IMPRESSION: MRI HEAD IMPRESSION: 1. 7 mm acute ischemic nonhemorrhagic infarct involving the deep white matter of the mid-posterior left centrum semi ovale. 2. Otherwise normal brain MRI for age. MRA HEAD IMPRESSION: 1. Negative intracranial MRA for large vessel occlusion. 2. Short-segment severe cavernous left ICA stenosis. 3. Otherwise negative intracranial MRA. No other hemodynamically significant or correctable stenosis. Electronically Signed   By: Jeannine Boga M.D.   On: 10/24/2018 06:59   MR MRA HEAD WO CONTRAST  Result Date: 10/24/2018 CLINICAL DATA:  Initial evaluation for new onset right-sided arm and leg weakness. EXAM: MRI HEAD WITHOUT CONTRAST MRA HEAD WITHOUT CONTRAST TECHNIQUE: Multiplanar, multiecho pulse sequences of the brain and surrounding structures were obtained without intravenous contrast. Angiographic images of the head were obtained using MRA technique without contrast. COMPARISON:  Prior CT from earlier the same day. FINDINGS: MRI HEAD FINDINGS Brain: Cerebral volume within normal limits for age. Minimal T2/FLAIR hyperintensity noted within the periventricular white matter, nonspecific, and felt to be within normal limits for age. 7 mm focus of diffusion abnormality seen at the mid-posterior left centrum semi ovale (series 5, image 80). No associated hemorrhage. No other evidence for acute or subacute ischemia. Gray-white matter differentiation otherwise maintained. No other areas of remote cortical  infarction. No foci of susceptibility artifact to suggest acute or chronic intracranial hemorrhage. No mass lesion, midline shift or mass effect. No  hydrocephalus. No extra-axial fluid collection. Pituitary gland suprasellar region normal. Midline structures intact. Vascular: Major intracranial vascular flow voids are maintained. Skull and upper cervical spine: Craniocervical junction normal. Bone marrow signal intensity within normal limits. Hyperostosis frontalis interna noted. Scalp soft tissues unremarkable. Sinuses/Orbits: Patient status post bilateral ocular lens replacement. Globes orbital soft tissues demonstrate no acute finding. Paranasal sinuses are clear. No mastoid effusion. Inner ear structures normal. Other: None. MRA HEAD FINDINGS ANTERIOR CIRCULATION: Distal cervical segments of the internal carotid arteries are patent with symmetric antegrade flow. Petrous segments widely patent bilaterally. Scattered atheromatous irregularity within the cavernous/supraclinoid ICAs. No significant stenosis seen on the right. There is a short-segment severe stenosis at the anterior genu of the cavernous left ICA (series 9, image 104). ICA termini well perfused. A1 segments patent bilaterally. Normal anterior communicating artery. Anterior cerebral arteries widely patent to their distal aspects. M1 segments widely patent bilaterally. Normal MCA bifurcations. Distal MCA branches well perfused and symmetric. Mild distal small vessel atheromatous irregularity. POSTERIOR CIRCULATION: Vertebral arteries patent to the vertebrobasilar junction without stenosis. Left vertebral artery dominant. Posterior inferior cerebral arteries patent bilaterally. Basilar patent to its distal aspect without stenosis. Superior cerebral arteries patent bilaterally. Both of the posterior cerebral arteries primarily supplied via the basilar and are well perfused to their distal aspects. No intracranial aneurysm. IMPRESSION: MRI HEAD IMPRESSION: 1. 7 mm acute ischemic nonhemorrhagic infarct involving the deep white matter of the mid-posterior left centrum semi ovale. 2. Otherwise normal  brain MRI for age. MRA HEAD IMPRESSION: 1. Negative intracranial MRA for large vessel occlusion. 2. Short-segment severe cavernous left ICA stenosis. 3. Otherwise negative intracranial MRA. No other hemodynamically significant or correctable stenosis. Electronically Signed   By: Jeannine Boga M.D.   On: 10/24/2018 06:59   CT Head Wo Contrast  Result Date: 10/24/2018 CLINICAL DATA:  Right upper extremity heaviness and lower extremity numbness EXAM: CT HEAD WITHOUT CONTRAST TECHNIQUE: Contiguous axial images were obtained from the base of the skull through the vertex without intravenous contrast. COMPARISON:  None. FINDINGS: Brain: No acute territorial infarction, hemorrhage, or intracranial mass. The ventricles are nonenlarged. Vascular: No hyperdense vessels.  Carotid vascular calcification. Skull: Normal. Negative for fracture or focal lesion. Sinuses/Orbits: No acute finding. Other: Chronic appearing abnormality at the anterior arch of C1, incompletely visualized. Prominent degenerative changes at the C1-C2 articulation. IMPRESSION: Negative non contrasted CT appearance of the brain.  Mild atrophy Electronically Signed   By: Donavan Foil M.D.   On: 10/24/2018 01:00   DG Chest Portable 1 View  Result Date: 10/24/2018 CLINICAL DATA:  TIA EXAM: PORTABLE CHEST 1 VIEW COMPARISON:  08/14/2017 FINDINGS: The heart size and mediastinal contours are within normal limits. Both lungs are clear. Surgical clips over the axilla bilaterally. Aortic atherosclerosis. IMPRESSION: No active disease. Electronically Signed   By: Donavan Foil M.D.   On: 10/24/2018 00:43    Assessment & Plan:   Problem List Items Addressed This Visit     Dyslipidemia - Primary    Statin intolerant Off Pravastatin Cont on Zetia       Relevant Orders   TSH   Lipid panel   Depression    Cont on Wellbutrin XL      Relevant Orders   TSH   CBC with Differential/Platelet   Arthralgia    Doing well  Relevant Orders    TSH   Urinalysis   CBC with Differential/Platelet   Comprehensive metabolic panel   History of lacunar cerebrovascular accident (CVA)    Continue with Plavix      Relevant Orders   TSH   Urinalysis   CBC with Differential/Platelet   Lipid panel   Comprehensive metabolic panel   Other Visit Diagnoses     Needs flu shot       Relevant Orders   Flu Vaccine QUAD High Dose(Fluad) (Completed)         No orders of the defined types were placed in this encounter.     Follow-up: Return in about 6 months (around 10/02/2022) for a follow-up visit.  Walker Kehr, MD

## 2022-04-03 NOTE — Assessment & Plan Note (Signed)
Cont on Wellbutrin XL

## 2022-04-03 NOTE — Assessment & Plan Note (Signed)
Continue with Plavix

## 2022-04-03 NOTE — Assessment & Plan Note (Signed)
Doing well 

## 2022-04-03 NOTE — Assessment & Plan Note (Signed)
Statin intolerant Off Pravastatin Cont on Zetia

## 2022-05-12 ENCOUNTER — Other Ambulatory Visit: Payer: Self-pay | Admitting: Internal Medicine

## 2022-05-15 ENCOUNTER — Ambulatory Visit: Payer: Medicare Other | Admitting: Internal Medicine

## 2022-06-10 DIAGNOSIS — Z1231 Encounter for screening mammogram for malignant neoplasm of breast: Secondary | ICD-10-CM | POA: Diagnosis not present

## 2022-06-10 LAB — HM MAMMOGRAPHY

## 2022-06-11 ENCOUNTER — Encounter: Payer: Self-pay | Admitting: Internal Medicine

## 2022-06-13 DIAGNOSIS — Z853 Personal history of malignant neoplasm of breast: Secondary | ICD-10-CM | POA: Diagnosis not present

## 2022-06-13 DIAGNOSIS — R922 Inconclusive mammogram: Secondary | ICD-10-CM | POA: Diagnosis not present

## 2022-06-13 DIAGNOSIS — R921 Mammographic calcification found on diagnostic imaging of breast: Secondary | ICD-10-CM | POA: Diagnosis not present

## 2022-06-13 DIAGNOSIS — R928 Other abnormal and inconclusive findings on diagnostic imaging of breast: Secondary | ICD-10-CM | POA: Diagnosis not present

## 2022-06-13 LAB — HM MAMMOGRAPHY

## 2022-06-18 ENCOUNTER — Encounter: Payer: Self-pay | Admitting: Internal Medicine

## 2022-06-26 ENCOUNTER — Encounter: Payer: Self-pay | Admitting: Internal Medicine

## 2022-07-04 ENCOUNTER — Other Ambulatory Visit: Payer: Self-pay | Admitting: Internal Medicine

## 2022-07-18 DIAGNOSIS — H353132 Nonexudative age-related macular degeneration, bilateral, intermediate dry stage: Secondary | ICD-10-CM | POA: Diagnosis not present

## 2022-08-19 ENCOUNTER — Telehealth: Payer: Self-pay | Admitting: Hematology and Oncology

## 2022-08-19 ENCOUNTER — Telehealth: Payer: Self-pay | Admitting: *Deleted

## 2022-08-19 NOTE — Telephone Encounter (Signed)
This RN spoke with pt per her call stating she is concerned due to prior mammogram showing an area of concern "and I do not see anyone at your office since Dr Jana Hakim said I didn't need to return "  She states she had mammogram in Jan 2024- which is viewable in EPIC- with noted area of concern with being offered to obtain a bx or recheck in 6 months.  Pt states she choose to recheck in 6 months- she states "I never get to speak with the same MD at the place I get my mammogram and just have concerns since I no longer come in there-"  This RN validated her concerns as well as discussed with her our survivorship program for pt's like her- which she is very interested in.  She would like to have an appt later in July after her mammogram on the 24th.  This RN sent scheduling request for above.

## 2022-08-19 NOTE — Telephone Encounter (Signed)
Left patient a vm regarding upcoming appointment  

## 2022-08-30 ENCOUNTER — Other Ambulatory Visit: Payer: Self-pay | Admitting: Internal Medicine

## 2022-10-07 ENCOUNTER — Ambulatory Visit (INDEPENDENT_AMBULATORY_CARE_PROVIDER_SITE_OTHER): Payer: Medicare Other

## 2022-10-07 ENCOUNTER — Encounter: Payer: Self-pay | Admitting: Internal Medicine

## 2022-10-07 ENCOUNTER — Ambulatory Visit (INDEPENDENT_AMBULATORY_CARE_PROVIDER_SITE_OTHER): Payer: Medicare Other | Admitting: Internal Medicine

## 2022-10-07 VITALS — BP 120/68 | HR 75 | Temp 98.0°F | Ht 61.0 in | Wt 135.0 lb

## 2022-10-07 DIAGNOSIS — F32A Depression, unspecified: Secondary | ICD-10-CM

## 2022-10-07 DIAGNOSIS — M5431 Sciatica, right side: Secondary | ICD-10-CM | POA: Diagnosis not present

## 2022-10-07 DIAGNOSIS — M7051 Other bursitis of knee, right knee: Secondary | ICD-10-CM

## 2022-10-07 DIAGNOSIS — G629 Polyneuropathy, unspecified: Secondary | ICD-10-CM

## 2022-10-07 DIAGNOSIS — M25561 Pain in right knee: Secondary | ICD-10-CM

## 2022-10-07 DIAGNOSIS — I10 Essential (primary) hypertension: Secondary | ICD-10-CM

## 2022-10-07 DIAGNOSIS — M79604 Pain in right leg: Secondary | ICD-10-CM | POA: Diagnosis not present

## 2022-10-07 DIAGNOSIS — M719 Bursopathy, unspecified: Secondary | ICD-10-CM | POA: Insufficient documentation

## 2022-10-07 DIAGNOSIS — M79661 Pain in right lower leg: Secondary | ICD-10-CM | POA: Diagnosis not present

## 2022-10-07 LAB — COMPREHENSIVE METABOLIC PANEL
ALT: 10 U/L (ref 0–35)
AST: 16 U/L (ref 0–37)
Albumin: 4.1 g/dL (ref 3.5–5.2)
Alkaline Phosphatase: 66 U/L (ref 39–117)
BUN: 20 mg/dL (ref 6–23)
CO2: 28 mEq/L (ref 19–32)
Calcium: 9.3 mg/dL (ref 8.4–10.5)
Chloride: 103 mEq/L (ref 96–112)
Creatinine, Ser: 0.9 mg/dL (ref 0.40–1.20)
GFR: 57.41 mL/min — ABNORMAL LOW (ref >60.00)
Glucose, Bld: 92 mg/dL (ref 70–99)
Potassium: 3.7 mEq/L (ref 3.5–5.1)
Sodium: 141 mEq/L (ref 135–145)
Total Bilirubin: 0.4 mg/dL (ref 0.2–1.2)
Total Protein: 6.7 g/dL (ref 6.0–8.3)

## 2022-10-07 NOTE — Assessment & Plan Note (Signed)
R b.anserina w/pain Blue-Emu cream was recommended to use 2-3 times a day Ice Good shoes Will inject if not better PT

## 2022-10-07 NOTE — Assessment & Plan Note (Signed)
R knee B anserina bursitis Start PT Blue-Emu cream was recommended to use 2-3 times a day We can inject

## 2022-10-07 NOTE — Patient Instructions (Addendum)
Blue-Emu cream -- use 2-3 times a day ? ?

## 2022-10-07 NOTE — Assessment & Plan Note (Signed)
B feet R>L Tylenol prn

## 2022-10-07 NOTE — Assessment & Plan Note (Signed)
Cont on Wellbutrin XL °

## 2022-10-07 NOTE — Assessment & Plan Note (Addendum)
Chronic leg pain - R Start PT - [employer]

## 2022-10-07 NOTE — Progress Notes (Signed)
Subjective:  Patient ID: Cynthia Garza, female    DOB: 03/06/1933  Age: 89 y.o. MRN: [id number]  CC: Follow-up (Rt side of rt shin pain, bottom of feet have burning sensation)   HPI Cynthia Garza presents for R leg numbness below the knee and at the bottom of the foot. Pain in the calf x long time (years). No LBP. Sx's are worse w/sitting  Outpatient Medications Prior to Visit  Medication Sig Dispense Refill   amLODipine (NORVASC) 5 MG tablet TAKE 1 TABLET BY MOUTH EVERY DAY 90 tablet 1   BIOTIN PO Take by mouth.     buPROPion (WELLBUTRIN XL) 150 MG 24 hr tablet TAKE 1 TABLET BY MOUTH DAILY 90 tablet 3   Cholecalciferol (VITAMIN D3) 50 MCG (2000 UT) capsule Take 1 capsule (2,000 Units total) by mouth daily. 100 capsule 3   clopidogrel (PLAVIX) 75 MG tablet TAKE 1 TABLET BY MOUTH EVERY DAY 90 tablet 1   ezetimibe (ZETIA) 10 MG tablet Take 1 tablet (10 mg total) by mouth daily. 90 tablet 3   Famotidine (PEPCID PO) Take by mouth.     levofloxacin (LEVAQUIN) 500 MG tablet      losartan-hydrochlorothiazide (HYZAAR) 100-25 MG tablet TAKE 1 TABLET BY MOUTH EVERY DAY 90 tablet 3   meclizine (ANTIVERT) 12.5 MG tablet Take 1 tablet (12.5 mg total) by mouth 2 (two) times daily as needed for dizziness. 60 tablet 2   Multiple Vitamins-Minerals (ALIVE ONCE DAILY WOMENS 50+) TABS Take 1 tablet by mouth daily.     Multiple Vitamins-Minerals (PRESERVISION AREDS 2 PO) Take 1 tablet by mouth 2 (two) times daily.     Propylene Glycol 0.6 % SOLN Place 2 drops into both eyes at bedtime.     vitamin B-12 (CYANOCOBALAMIN) 1000 MCG tablet Take 1,000 mcg by mouth daily.     zolpidem (AMBIEN) 10 MG tablet TAKE 1/2-1 TABLET BY MOUTH AT BEDTIME AS NEEDED FOR SLEEP 90 tablet 1   No facility-administered medications prior to visit.    ROS: Review of Systems  Constitutional:  Negative for activity change, appetite change, chills, fatigue and unexpected weight change.  HENT:  Negative for congestion,  mouth sores and sinus pressure.   Eyes:  Negative for visual disturbance.  Respiratory:  Negative for cough and chest tightness.   Gastrointestinal:  Negative for abdominal pain and nausea.  Genitourinary:  Negative for difficulty urinating, frequency and vaginal pain.  Musculoskeletal:  Positive for arthralgias. Negative for back pain, gait problem and joint swelling.  Skin:  Negative for pallor and rash.  Neurological:  Positive for numbness. Negative for dizziness, tremors, weakness and headaches.  Psychiatric/Behavioral:  Negative for confusion and sleep disturbance.     Objective:  BP 120/68 (BP Location: Right Arm, Patient Position: Sitting, Cuff Size: Normal)   Pulse 75   Temp 98 F (36.7 C) (Oral)   Ht 5\' 1"  (1.549 m)   Wt 135 lb (61.2 kg)   SpO2 96%   BMI 25.51 kg/m   BP Readings from Last 3 Encounters:  10/07/22 120/68  04/03/22 136/72  11/13/21 130/68    Wt Readings from Last 3 Encounters:  10/07/22 135 lb (61.2 kg)  04/03/22 135 lb 3.2 oz (61.3 kg)  11/29/21 139 lb (63 kg)    Physical Exam Constitutional:      General: She is not in acute distress.    Appearance: Normal appearance. She is well-developed.  HENT:     Head: Normocephalic.  Right Ear: External ear normal.     Left Ear: External ear normal.     Nose: Nose normal.  Eyes:     General:        Right eye: No discharge.        Left eye: No discharge.     Conjunctiva/sclera: Conjunctivae normal.     Pupils: Pupils are equal, round, and reactive to light.  Neck:     Thyroid: No thyromegaly.     Vascular: No JVD.     Trachea: No tracheal deviation.  Cardiovascular:     Rate and Rhythm: Normal rate and regular rhythm.     Heart sounds: Normal heart sounds.  Pulmonary:     Effort: No respiratory distress.     Breath sounds: No stridor. No wheezing.  Abdominal:     General: Bowel sounds are normal. There is no distension.     Palpations: Abdomen is soft. There is no mass.     Tenderness:  There is no abdominal tenderness. There is no guarding or rebound.  Musculoskeletal:        General: Tenderness present.     Cervical back: Normal range of motion and neck supple. No rigidity.     Right lower leg: No edema.     Left lower leg: No edema.  Lymphadenopathy:     Cervical: No cervical adenopathy.  Skin:    Findings: No erythema or rash.  Neurological:     Cranial Nerves: No cranial nerve deficit.     Motor: No abnormal muscle tone.     Coordination: Coordination normal.     Deep Tendon Reflexes: Reflexes normal.  Psychiatric:        Behavior: Behavior normal.        Thought Content: Thought content normal.        Judgment: Judgment normal.   R b.anserina w/pain [place] leg elev (-) B Pulses, DTRs wnl B legs/feet  Lab Results  Component Value Date   WBC 9.6 04/03/2022   HGB 13.2 04/03/2022   HCT 39.4 04/03/2022   PLT 222.0 04/03/2022   GLUCOSE 98 04/03/2022   CHOL 192 04/03/2022   TRIG 204.0 (H) 04/03/2022   HDL 61.10 04/03/2022   LDLDIRECT 114.0 04/03/2022   LDLCALC 100 (H) 05/15/2021   ALT 9 04/03/2022   AST 14 04/03/2022   NA 142 04/03/2022   K 3.7 04/03/2022   CL 103 04/03/2022   CREATININE 0.90 04/03/2022   BUN 22 04/03/2022   CO2 30 04/03/2022   TSH 1.33 04/03/2022   INR 1.0 10/24/2018   HGBA1C 5.6 10/24/2018    VAS US CAROTID (at [facility] and [facility] only)  Result Date: 10/25/2018 Carotid Arterial Duplex Study Indications:  CVA. Risk Factors: Hypertension. Performing Technologist: Blanch Media RVS  Examination Guidelines: A complete evaluation includes B-mode imaging, spectral Doppler, color Doppler, and power Doppler as needed of all accessible portions of each vessel. Bilateral testing is considered an integral part of a complete examination. Limited examinations for reoccurring indications may be performed as noted.  Right Carotid Findings: +----------+--------+--------+--------+------------+--------+           PSV cm/sEDV cm/sStenosisDescribe     Comments +----------+--------+--------+--------+------------+--------+ CCA Prox  110     19              heterogenous         +----------+--------+--------+--------+------------+--------+ CCA Distal103     19              heterogenous         +----------+--------+--------+--------+------------+--------+  ICA Prox  73      17      1-39%   heterogenous         +----------+--------+--------+--------+------------+--------+ ICA Distal106     24                                   +----------+--------+--------+--------+------------+--------+ ECA       94      6                                    +----------+--------+--------+--------+------------+--------+ +----------+--------+-------+--------+-------------------+           PSV cm/sEDV cmsDescribeArm Pressure (mmHG) +----------+--------+-------+--------+-------------------+ [id number]                                        +----------+--------+-------+--------+-------------------+ +---------+--------+--+--------+--+---------+ VertebralPSV cm/s37EDV cm/s10Antegrade +---------+--------+--+--------+--+---------+  Left Carotid Findings: +----------+--------+--------+--------+------------+--------+           PSV cm/sEDV cm/sStenosisDescribe    Comments +----------+--------+--------+--------+------------+--------+ CCA Prox  132     14              heterogenous         +----------+--------+--------+--------+------------+--------+ CCA Distal100     17              heterogenous         +----------+--------+--------+--------+------------+--------+ ICA Prox  107     25      1-39%   heterogenous         +----------+--------+--------+--------+------------+--------+ ICA Distal82      13                                   +----------+--------+--------+--------+------------+--------+ ECA       80                                            +----------+--------+--------+--------+------------+--------+ +----------+--------+--------+--------+-------------------+ SubclavianPSV cm/sEDV cm/sDescribeArm Pressure (mmHG) +----------+--------+--------+--------+-------------------+           138                                         +----------+--------+--------+--------+-------------------+ +---------+--------+--+--------+--+---------+ VertebralPSV cm/s67EDV cm/s17Antegrade +---------+--------+--+--------+--+---------+  Summary: Right Carotid: Velocities in the right ICA are consistent with a 1-39% stenosis. Left Carotid: Velocities in the left ICA are consistent with a 1-39% stenosis. Vertebrals: Bilateral vertebral arteries demonstrate antegrade flow. *See table(s) above for measurements and observations.  Electronically signed by Delia Heady MD on 10/25/2018 at 12:23:35 PM.    Final    ECHOCARDIOGRAM COMPLETE  Result Date: 10/24/2018   ECHOCARDIOGRAM REPORT   Patient Name:   DAINA PERIGO Date of Exam: 10/24/2018 Medical Rec #:  [id number]           Height:       61.0 in Accession #:    [id number]          Weight:       135.0 lb Date of Birth:  [birth year]  BSA:          1.60 m Patient Age:    85 years            BP:           118/57 mmHg Patient Gender: F                   HR:           75 bpm. Exam Location:  Inpatient  Procedure: 2D Echo Indications:    Stroke 434.91  History:        Patient has no prior history of Echocardiogram examinations.                 Signs/Symptoms: Murmur Risk Factors: Hypertension and                 Dyslipidemia.  Sonographer:    Thurman Coyer RDCS (AE) Referring Phys: [phone] JARED M GARDNER IMPRESSIONS  1. The left ventricle has hyperdynamic systolic function, with an ejection fraction of >65%. The cavity size was normal. Left ventricular diastolic Doppler parameters are consistent with impaired relaxation.  2. The right ventricle has normal systolic function. The cavity was normal. There  is no increase in right ventricular wall thickness.  3. Left atrial size was mildly dilated.  4. The mitral valve is abnormal. Mild thickening of the mitral valve leaflet. There is moderate mitral annular calcification present.  5. The aortic valve is tricuspid. Mild thickening of the aortic valve. Moderate calcification of the aortic valve.  6. The interatrial septum was not assessed. FINDINGS  Left Ventricle: The left ventricle has hyperdynamic systolic function, with an ejection fraction of >65%. The cavity size was normal. The left ventricular wall thickness was not assessed. Left ventricular diastolic Doppler parameters are consistent with  impaired relaxation. Right Ventricle: The right ventricle has normal systolic function. The cavity was normal. There is no increase in right ventricular wall thickness. Left Atrium: Left atrial size was mildly dilated. Right Atrium: Right atrial size was normal in size. Right atrial pressure is estimated at 10 mmHg. Interatrial Septum: The interatrial septum was not assessed. Pericardium: There is no evidence of pericardial effusion. Mitral Valve: The mitral valve is abnormal. Mild thickening of the mitral valve leaflet. There is moderate mitral annular calcification present. Mitral valve regurgitation is trivial by color flow Doppler. Tricuspid Valve: The tricuspid valve is normal in structure. Tricuspid valve regurgitation is trivial by color flow Doppler. Aortic Valve: The aortic valve is tricuspid Mild thickening of the aortic valve. Moderate calcification of the aortic valve. Aortic valve regurgitation was not visualized by color flow Doppler. Pulmonic Valve: The pulmonic valve was normal in structure. Pulmonic valve regurgitation is not visualized by color flow Doppler. Venous: The inferior vena cava is normal in size with greater than 50% respiratory variability.  +--------------+--------++ LEFT VENTRICLE         +----------------+---------++  +--------------+--------++ Diastology                PLAX 2D                +----------------+---------++ +--------------+--------++ LV e' lateral:  5.98 cm/s LVIDd:        3.60 cm  +----------------+---------++ +--------------+--------++ LV E/e' lateral:18.6      LVIDs:        2.40 cm  +----------------+---------++ +--------------+--------++ LV e' medial:   4.90 cm/s LV PW:        1.00 cm  +----------------+---------++ +--------------+--------++ LV E/e'  medial: 22.7      LV IVS:       1.10 cm  +----------------+---------++ +--------------+--------++ LVOT diam:    2.00 cm  +--------------+--------++ LV SV:        34 ml    +--------------+--------++ LV SV Index:  20.93    +--------------+--------++ LVOT Area:    3.14 cm +--------------+--------++                        +--------------+--------++ +---------------+----------++ RIGHT VENTRICLE           +---------------+----------++ RV S prime:    12.10 cm/s +---------------+----------++ TAPSE (M-mode):2.2 cm     +---------------+----------++ RVSP:          24.2 mmHg  +---------------+----------++ +---------------+-------++-----------++ LEFT ATRIUM           Index       +---------------+-------++-----------++ LA diam:       2.80 cm1.75 cm/m  +---------------+-------++-----------++ LA Vol (A2C):  51.5 ml32.22 ml/m +---------------+-------++-----------++ LA Vol (A4C):  56.7 ml35.48 ml/m +---------------+-------++-----------++ LA Biplane Vol:57.8 ml36.17 ml/m +---------------+-------++-----------++ +------------+---------++-----------++ RIGHT ATRIUM         Index       +------------+---------++-----------++ RA Pressure:3.00 mmHg            +------------+---------++-----------++ RA Area:    11.30 cm            +------------+---------++-----------++ RA Volume:  24.70 ml 15.45 ml/m +------------+---------++-----------++   +------------+-----------++ AORTIC VALVE            +------------+-----------++ LVOT Vmax:  122.00 cm/s +------------+-----------++ LVOT Vmean: 86.500 cm/s +------------+-----------++ LVOT VTI:   0.330 m     +------------+-----------++  +-------------+-------++ AORTA                +-------------+-------++ Ao Root diam:3.00 cm +-------------+-------++ +--------------+-----------++ +---------------+-----------++ MITRAL VALVE              TRICUSPID VALVE            +--------------+-----------++ +---------------+-----------++ MV Area (PHT):2.08 cm    TR Peak grad:  21.2 mmHg   +--------------+-----------++ +---------------+-----------++ MV PHT:       106.00 msec TR Vmax:       230.00 cm/s +--------------+-----------++ +---------------+-----------++ +--------------+-----------++ Estimated RAP: 3.00 mmHg   MV E velocity:111.00 cm/s +---------------+-----------++ +--------------+-----------++ RVSP:          24.2 mmHg   MV A velocity:156.00 cm/s +---------------+-----------++ +--------------+-----------++ MV E/A ratio: 0.71        +--------------+-------+ +--------------+-----------++ SHUNTS                                              +--------------+-------+                               Systemic VTI: 0.33 m                                +--------------+-------+                               Systemic Diam:2.00 cm                               +--------------+-------+  Dietrich Pates MD Electronically signed by Dietrich Pates MD Signature Date/Time: 10/24/2018/1:35:21 PM    Final    MR BRAIN WO CONTRAST  Result Date: 10/24/2018 CLINICAL DATA:  Initial evaluation for new onset right-sided arm and leg weakness. EXAM: MRI HEAD WITHOUT CONTRAST MRA HEAD WITHOUT CONTRAST TECHNIQUE: Multiplanar, multiecho pulse sequences of the brain and surrounding structures were obtained without intravenous contrast. Angiographic images of the head were obtained using  MRA technique without contrast. COMPARISON:  Prior CT from earlier the same day. FINDINGS: MRI HEAD FINDINGS Brain: Cerebral volume within normal limits for age. Minimal T2/FLAIR hyperintensity noted within the periventricular white matter, nonspecific, and felt to be within normal limits for age. 7 mm focus of diffusion abnormality seen at the mid-posterior left centrum semi ovale (series 5, image 80). No associated hemorrhage. No other evidence for acute or subacute ischemia. Gray-white matter differentiation otherwise maintained. No other areas of remote cortical infarction. No foci of susceptibility artifact to suggest acute or chronic intracranial hemorrhage. No mass lesion, midline shift or mass effect. No hydrocephalus. No extra-axial fluid collection. Pituitary gland suprasellar region normal. Midline structures intact. Vascular: Major intracranial vascular flow voids are maintained. Skull and upper cervical spine: Craniocervical junction normal. Bone marrow signal intensity within normal limits. Hyperostosis frontalis interna noted. Scalp soft tissues unremarkable. Sinuses/Orbits: Patient status post bilateral ocular lens replacement. Globes orbital soft tissues demonstrate no acute finding. Paranasal sinuses are clear. No mastoid effusion. Inner ear structures normal. Other: None. MRA HEAD FINDINGS ANTERIOR CIRCULATION: Distal cervical segments of the internal carotid arteries are patent with symmetric antegrade flow. Petrous segments widely patent bilaterally. Scattered atheromatous irregularity within the cavernous/supraclinoid ICAs. No significant stenosis seen on the right. There is a short-segment severe stenosis at the anterior genu of the cavernous left ICA (series 9, image 104). ICA termini well perfused. A1 segments patent bilaterally. Normal anterior communicating artery. Anterior cerebral arteries widely patent to their distal aspects. M1 segments widely patent bilaterally. Normal MCA  bifurcations. Distal MCA branches well perfused and symmetric. Mild distal small vessel atheromatous irregularity. POSTERIOR CIRCULATION: Vertebral arteries patent to the vertebrobasilar junction without stenosis. Left vertebral artery dominant. Posterior inferior cerebral arteries patent bilaterally. Basilar patent to its distal aspect without stenosis. Superior cerebral arteries patent bilaterally. Both of the posterior cerebral arteries primarily supplied via the basilar and are well perfused to their distal aspects. No intracranial aneurysm. IMPRESSION: MRI HEAD IMPRESSION: 1. 7 mm acute ischemic nonhemorrhagic infarct involving the deep white matter of the mid-posterior left centrum semi ovale. 2. Otherwise normal brain MRI for age. MRA HEAD IMPRESSION: 1. Negative intracranial MRA for large vessel occlusion. 2. Short-segment severe cavernous left ICA stenosis. 3. Otherwise negative intracranial MRA. No other hemodynamically significant or correctable stenosis. Electronically Signed   By: Rise Mu M.D.   On: 10/24/2018 06:59   MR MRA HEAD WO CONTRAST  Result Date: 10/24/2018 CLINICAL DATA:  Initial evaluation for new onset right-sided arm and leg weakness. EXAM: MRI HEAD WITHOUT CONTRAST MRA HEAD WITHOUT CONTRAST TECHNIQUE: Multiplanar, multiecho pulse sequences of the brain and surrounding structures were obtained without intravenous contrast. Angiographic images of the head were obtained using MRA technique without contrast. COMPARISON:  Prior CT from earlier the same day. FINDINGS: MRI HEAD FINDINGS Brain: Cerebral volume within normal limits for age. Minimal T2/FLAIR hyperintensity noted within the periventricular white matter, nonspecific, and felt to be within normal limits for age. 7 mm focus of diffusion abnormality seen at the mid-posterior left centrum semi ovale (series  5, image 80). No associated hemorrhage. No other evidence for acute or subacute ischemia. Gray-white matter  differentiation otherwise maintained. No other areas of remote cortical infarction. No foci of susceptibility artifact to suggest acute or chronic intracranial hemorrhage. No mass lesion, midline shift or mass effect. No hydrocephalus. No extra-axial fluid collection. Pituitary gland suprasellar region normal. Midline structures intact. Vascular: Major intracranial vascular flow voids are maintained. Skull and upper cervical spine: Craniocervical junction normal. Bone marrow signal intensity within normal limits. Hyperostosis frontalis interna noted. Scalp soft tissues unremarkable. Sinuses/Orbits: Patient status post bilateral ocular lens replacement. Globes orbital soft tissues demonstrate no acute finding. Paranasal sinuses are clear. No mastoid effusion. Inner ear structures normal. Other: None. MRA HEAD FINDINGS ANTERIOR CIRCULATION: Distal cervical segments of the internal carotid arteries are patent with symmetric antegrade flow. Petrous segments widely patent bilaterally. Scattered atheromatous irregularity within the cavernous/supraclinoid ICAs. No significant stenosis seen on the right. There is a short-segment severe stenosis at the anterior genu of the cavernous left ICA (series 9, image 104). ICA termini well perfused. A1 segments patent bilaterally. Normal anterior communicating artery. Anterior cerebral arteries widely patent to their distal aspects. M1 segments widely patent bilaterally. Normal MCA bifurcations. Distal MCA branches well perfused and symmetric. Mild distal small vessel atheromatous irregularity. POSTERIOR CIRCULATION: Vertebral arteries patent to the vertebrobasilar junction without stenosis. Left vertebral artery dominant. Posterior inferior cerebral arteries patent bilaterally. Basilar patent to its distal aspect without stenosis. Superior cerebral arteries patent bilaterally. Both of the posterior cerebral arteries primarily supplied via the basilar and are well perfused to their  distal aspects. No intracranial aneurysm. IMPRESSION: MRI HEAD IMPRESSION: 1. 7 mm acute ischemic nonhemorrhagic infarct involving the deep white matter of the mid-posterior left centrum semi ovale. 2. Otherwise normal brain MRI for age. MRA HEAD IMPRESSION: 1. Negative intracranial MRA for large vessel occlusion. 2. Short-segment severe cavernous left ICA stenosis. 3. Otherwise negative intracranial MRA. No other hemodynamically significant or correctable stenosis. Electronically Signed   By: Rise Mu M.D.   On: 10/24/2018 06:59   CT Head Wo Contrast  Result Date: 10/24/2018 CLINICAL DATA:  Right upper extremity heaviness and lower extremity numbness EXAM: CT HEAD WITHOUT CONTRAST TECHNIQUE: Contiguous axial images were obtained from the base of the skull through the vertex without intravenous contrast. COMPARISON:  None. FINDINGS: Brain: No acute territorial infarction, hemorrhage, or intracranial mass. The ventricles are nonenlarged. Vascular: No hyperdense vessels.  Carotid vascular calcification. Skull: Normal. Negative for fracture or focal lesion. Sinuses/Orbits: No acute finding. Other: Chronic appearing abnormality at the anterior arch of C1, incompletely visualized. Prominent degenerative changes at the C1-C2 articulation. IMPRESSION: Negative non contrasted CT appearance of the brain.  Mild atrophy Electronically Signed   By: Jasmine Pang M.D.   On: 10/24/2018 01:00   DG Chest Portable 1 View  Result Date: 10/24/2018 CLINICAL DATA:  TIA EXAM: PORTABLE CHEST 1 VIEW COMPARISON:  08/14/2017 FINDINGS: The heart size and mediastinal contours are within normal limits. Both lungs are clear. Surgical clips over the axilla bilaterally. Aortic atherosclerosis. IMPRESSION: No active disease. Electronically Signed   By: Jasmine Pang M.D.   On: 10/24/2018 00:43    Assessment & Plan:   Problem List Items Addressed This Visit     Essential hypertension - Primary (Chronic)    Cont on   Amlodipine, Losartan HCT      Relevant Orders   DG Tibia/Fibula Right   Comprehensive metabolic panel   Depression    Cont on Wellbutrin  XL      Sciatica of right side    Chronic leg pain - R Start PT - [place]       Relevant Orders   Ambulatory referral to Physical Therapy   Arthralgia    R knee B anserina bursitis Start PT Blue-Emu cream was recommended to use 2-3 times a day We can inject       Neuropathy    B feet R>L Tylenol prn      Bursitis    R b.anserina w/pain Blue-Emu cream was recommended to use 2-3 times a day Ice Good shoes Will inject if not better PT      Other Visit Diagnoses     Leg pain, anterior, right       Relevant Orders   Ambulatory referral to Physical Therapy         No orders of the defined types were placed in this encounter.     Follow-up: Return in about 6 weeks (around 11/18/2022) for a follow-up visit.  Sonda Primes, MD

## 2022-10-07 NOTE — Assessment & Plan Note (Signed)
Cont on  Amlodipine, Losartan HCT °

## 2022-10-08 ENCOUNTER — Ambulatory Visit: Payer: Medicare Other | Attending: Internal Medicine | Admitting: Physical Therapy

## 2022-10-08 ENCOUNTER — Encounter: Payer: Self-pay | Admitting: Physical Therapy

## 2022-10-08 DIAGNOSIS — M79604 Pain in right leg: Secondary | ICD-10-CM | POA: Insufficient documentation

## 2022-10-08 DIAGNOSIS — M79661 Pain in right lower leg: Secondary | ICD-10-CM

## 2022-10-08 DIAGNOSIS — M5459 Other low back pain: Secondary | ICD-10-CM | POA: Insufficient documentation

## 2022-10-08 DIAGNOSIS — M5431 Sciatica, right side: Secondary | ICD-10-CM | POA: Diagnosis not present

## 2022-10-08 DIAGNOSIS — M6281 Muscle weakness (generalized): Secondary | ICD-10-CM | POA: Diagnosis not present

## 2022-10-08 NOTE — Therapy (Signed)
OUTPATIENT PHYSICAL THERAPY THORACOLUMBAR EVALUATION   Patient Name: Cynthia Garza MRN: [id number] DOB:[birth year], 89 y.o., female Today's Date: 10/08/2022  END OF SESSION:  PT End of Session - 10/08/22 1100     Visit Number 1    Date for PT Re-Evaluation 01/08/23    Authorization Type [facility]    PT Start Time 1100    PT Stop Time 1145    PT Time Calculation (min) 45 min    Activity Tolerance Patient tolerated treatment well    Behavior During Therapy WFL for tasks assessed/performed             Past Medical History:  Diagnosis Date   Anxiety    Breast cancer (HCC) 2015   ER+/PR+/Her2-   Depression    Dyslipidemia    GERD (gastroesophageal reflux disease)    Heart murmur    Hemangioma of intra-abdominal structures    HTN (hypertension)    Insomnia    LBP (low back pain)    Osteoporosis    Paresthesia    Radiation 08/08/13-08/29/13   Bilat.breast 42.72 Gy   Renal cyst    Shingles    Past Surgical History:  Procedure Laterality Date   ABDOMINAL HYSTERECTOMY     ?1970's   BREAST LUMPECTOMY WITH NEEDLE LOCALIZATION AND AXILLARY SENTINEL LYMPH NODE BX Bilateral 07/07/2013   Procedure: BREAST LUMPECTOMY WITH NEEDLE LOCALIZATION AND AXILLARY SENTINEL LYMPH NODE BIOPIES;  Surgeon: Robyne Askew, MD;  Location: [facility];  Service: General;  Laterality: Bilateral;   Patient Active Problem List   Diagnosis Date Noted   Bursitis 10/07/2022   Leg cramps 02/12/2021   History of lacunar cerebrovascular accident (CVA) 11/13/2020   Neuropathy 11/12/2020   Statin myopathy 05/14/2020   Grief 05/14/2020   Weakness of right foot 10/12/2019   Cough 07/12/2019   Right sided weakness 10/24/2018   Hemiparesis as late effect of cerebrovascular accident (CVA) (HCC) 10/24/2018   Pain in finger 03/25/2018   TMJ click 12/29/2017   CAP (community acquired pneumonia) 08/14/2017   Epistaxis 01/20/2017   Anxiety 01/20/2017   Arthralgia 09/01/2016   Well adult exam 12/18/2015    Hypokalemia 06/17/2015   Malignant neoplasm of upper-outer quadrant of right breast in female, estrogen receptor positive (HCC) 06/02/2013   Right hip pain 10/22/2012   Sciatica of right side 10/22/2012   Memory problem 02/09/2012   Pyelonephritis 12/04/2011   Insomnia 11/04/2011   Fatigue due to treatment 01/23/2009   VERTIGO 06/27/2008   HEMANGIOMA, HEPATIC 03/24/2007   Dyslipidemia 03/24/2007   Depression 03/24/2007   Essential hypertension 03/24/2007   GASTROESOPHAGEAL REFLUX DISEASE 03/24/2007   RENAL CYST 03/24/2007   Osteoporosis 03/24/2007   SHINGLES, HX OF 03/24/2007   Acquired absence of genital organ 03/24/2007    PCP: Plotnikov, MD  REFERRING PROVIDER: Plotnikov, MD  REFERRING DIAG: sciatica left lower leg pain  Rationale for Evaluation and Treatment: Rehabilitation  THERAPY DIAG:  Other low back pain  Muscle weakness (generalized)  Pain in right lower leg  ONSET DATE: January 2024  SUBJECTIVE:  SUBJECTIVE STATEMENT: Patient reports right lower leg pain for about 2 years, reports that she thought it was sciatica, she reports that yesterday it was decided it could be bursitis  PERTINENT HISTORY:  As above, TIA  PAIN:  Are you having pain? Yes: NPRS scale: 0/10 Pain location: right lower leg anterior pain, slight medial Pain description: weakness in the leg, dull Aggravating factors: after sitting, walk longer periods can be up to 7/10 Relieving factors: tylenol at times pain can be 0/10  PRECAUTIONS: None  WEIGHT BEARING RESTRICTIONS: No  FALLS:  Has patient fallen in last 6 months? Yes. Number of falls 1, reports shoe got caught  LIVING ENVIRONMENT: Lives with: lives alone Lives in: House/apartment Stairs: No Has following equipment at home: None  OCCUPATION:  retired  PLOF: Independent and shops two hours at times  PATIENT GOALS: have less pain, be able to walk, feel better about my   NEXT MD VISIT: July 2024  OBJECTIVE:   DIAGNOSTIC FINDINGS:  none  COGNITION: Overall cognitive status: Within functional limits for tasks assessed     SENSATION: WFL  MUSCLE LENGTH:  POSTURE: rounded shoulders and forward head  PALPATION: Tender left anterior medial shin , some edema in this area, she does mention that the MD felt she had some bursitis  LUMBAR ROM:   AROM eval  Flexion Decreased 25%  Extension Decreased 75%  Right lateral flexion Decreased 75%  Left lateral flexion Decreased 75%  Right rotation   Left rotation    (Blank rows = not tested)  LOWER EXTREMITY ROM:     Active  Right eval Left eval  Hip flexion    Hip extension    Hip abduction    Hip adduction    Hip internal rotation    Hip external rotation    Knee flexion 115   Knee extension 5   Ankle dorsiflexion 0   Ankle plantarflexion    Ankle inversion    Ankle eversion     (Blank rows = not tested)  LOWER EXTREMITY MMT:    MMT Right eval Left eval  Hip flexion 4+   Hip extension    Hip abduction 4-   Hip adduction    Hip internal rotation    Hip external rotation    Knee flexion 4+   Knee extension 4+   Ankle dorsiflexion 3+   Ankle plantarflexion 4   Ankle inversion 3+   Ankle eversion 4    (Blank rows = not tested)   FUNCTIONAL TESTS:  Timed up and go (TUG): 13 3 minute walk test: not tested Berg Balance Scale: 44/56  GAIT: Distance walked: 50 feet Assistive device utilized: None Level of assistance: Complete Independence Comments: tends to be somewhat unsteady, has decreased strength in the right leg and hip and tends to dip on this side with stance phase  TODAY'S TREATMENT:  DATE:  10/08/22 Ionto 80mA  dose patch on the right medial shin, educated on safety and the reason for use to her and her granddaughter, the agreed on treatment.    PATIENT EDUCATION:  Education details: HEP/POC Person educated: Patient and granddaughter Education method: Explanation, Facilities manager, Actor cues, Verbal cues, and Handouts Education comprehension: verbalized understanding, returned demonstration, and verbal cues required  HOME EXERCISE PROGRAM: Access Code: [email or web address] URL: [email or web address] Date: 10/08/2022 Prepared by: Stacie Glaze  Exercises - Toe Raises with Counter Support  - 1 x daily - 7 x weekly - 2 sets - 10 reps - 2 hold - Standing Heel Raise with Chair Support  - 1 x daily - 7 x weekly - 2 sets - 10 reps - 2 hold - Standing Hip Abduction  - 1 x daily - 7 x weekly - 2 sets - 10 reps - 2 hold - Standing Hip Flexion  - 1 x daily - 7 x weekly - 2 sets - 10 reps - 30 hold - Tandem Stance with Chair Support  - 1 x daily - 7 x weekly - 2 sets - 10 reps - 2 hold  ASSESSMENT:  CLINICAL IMPRESSION: Patient is a 89 y.o. female who was seen today for physical therapy evaluation and treatment for bursitis of the right medial lower leg.  She is very tender to this area and the MD feels it is bursitis, she also  has weakness of the left LE, especially the left ankle DF and inversion and the left hip.  She reports that she did not think it was weak from the TIA that she had 5 years ago, she is unsteady with gait, Berg balance score was 44/56, she is very independent and had difficulty with describing what she is feeling  OBJECTIVE IMPAIRMENTS: Abnormal gait, cardiopulmonary status limiting activity, decreased activity tolerance, decreased balance, decreased endurance, decreased mobility, difficulty walking, decreased ROM, decreased strength, increased muscle spasms, impaired flexibility, improper body mechanics, postural dysfunction, and pain.   REHAB POTENTIAL: Good  CLINICAL  DECISION MAKING: Stable/uncomplicated  EVALUATION COMPLEXITY: Low   GOALS: Goals reviewed with patient? Yes  SHORT TERM GOALS: Target date: 10/22/22  Independent with initial HEP Goal status: INITIAL  LONG TERM GOALS: Target date: 01/08/23  Independent with advanced HEP for balance Goal status: INITIAL  2.  Increase Berg balance test to 48/56 to decrease risk for falls Goal status: INITIAL  3.  Increase right ankle DF to 10 degrees Goal status: INITIAL  4.  Increase right ankle and hip strength to 4/5 Goal status: INITIAL  5.  Report pain decreased 50% Goal status: INITIAL  PLAN:  PT FREQUENCY: 1-2x/week  PT DURATION: 12 weeks  PLANNED INTERVENTIONS: Therapeutic exercises, Therapeutic activity, Neuromuscular re-education, Balance training, Gait training, Patient/Family education, Self Care, Joint mobilization, Dry Needling, Electrical stimulation, Cryotherapy, Moist heat, Taping, Ultrasound, Ionotophoresis 4mg /ml Dexamethasone, and Manual therapy.  PLAN FOR NEXT SESSION: ankle ROM, strength and balance   Timmy Bubeck W, PT 10/08/2022, 11:06 AM

## 2022-10-15 ENCOUNTER — Encounter: Payer: Self-pay | Admitting: Physical Therapy

## 2022-10-15 ENCOUNTER — Ambulatory Visit: Payer: Medicare Other | Admitting: Physical Therapy

## 2022-10-15 DIAGNOSIS — M5459 Other low back pain: Secondary | ICD-10-CM

## 2022-10-15 DIAGNOSIS — M79604 Pain in right leg: Secondary | ICD-10-CM | POA: Diagnosis not present

## 2022-10-15 DIAGNOSIS — M79661 Pain in right lower leg: Secondary | ICD-10-CM | POA: Diagnosis not present

## 2022-10-15 DIAGNOSIS — M5431 Sciatica, right side: Secondary | ICD-10-CM | POA: Diagnosis not present

## 2022-10-15 DIAGNOSIS — M6281 Muscle weakness (generalized): Secondary | ICD-10-CM | POA: Diagnosis not present

## 2022-10-15 NOTE — Therapy (Signed)
OUTPATIENT PHYSICAL THERAPY THORACOLUMBAR TREATMENT   Patient Name: Cynthia Garza MRN: [id number] DOB:[birth year], 89 y.o., female Today's Date: 10/15/2022  END OF SESSION:  PT End of Session - 10/15/22 1146     Visit Number 2    Date for PT Re-Evaluation 01/08/23    PT Start Time 1145    PT Stop Time 1230    PT Time Calculation (min) 45 min    Activity Tolerance Patient tolerated treatment well    Behavior During Therapy [facility] for tasks assessed/performed             Past Medical History:  Diagnosis Date   Anxiety    Breast cancer (HCC) 2015   ER+/PR+/Her2-   Depression    Dyslipidemia    GERD (gastroesophageal reflux disease)    Heart murmur    Hemangioma of intra-abdominal structures    HTN (hypertension)    Insomnia    LBP (low back pain)    Osteoporosis    Paresthesia    Radiation 08/08/13-08/29/13   Bilat.breast 42.72 Gy   Renal cyst    Shingles    Past Surgical History:  Procedure Laterality Date   ABDOMINAL HYSTERECTOMY     ?1970's   BREAST LUMPECTOMY WITH NEEDLE LOCALIZATION AND AXILLARY SENTINEL LYMPH NODE BX Bilateral 07/07/2013   Procedure: BREAST LUMPECTOMY WITH NEEDLE LOCALIZATION AND AXILLARY SENTINEL LYMPH NODE BIOPIES;  Surgeon: Robyne Askew, MD;  Location: [facility] OR;  Service: General;  Laterality: Bilateral;   Patient Active Problem List   Diagnosis Date Noted   Bursitis 10/07/2022   Leg cramps 02/12/2021   History of lacunar cerebrovascular accident (CVA) 11/13/2020   Neuropathy 11/12/2020   Statin myopathy 05/14/2020   Grief 05/14/2020   Weakness of right foot 10/12/2019   Cough 07/12/2019   Right sided weakness 10/24/2018   Hemiparesis as late effect of cerebrovascular accident (CVA) (HCC) 10/24/2018   Pain in finger 03/25/2018   TMJ click 12/29/2017   CAP (community acquired pneumonia) 08/14/2017   Epistaxis 01/20/2017   Anxiety 01/20/2017   Arthralgia 09/01/2016   Well adult exam 12/18/2015   Hypokalemia 06/17/2015    Malignant neoplasm of upper-outer quadrant of right breast in female, estrogen receptor positive (HCC) 06/02/2013   Right hip pain 10/22/2012   Sciatica of right side 10/22/2012   Memory problem 02/09/2012   Pyelonephritis 12/04/2011   Insomnia 11/04/2011   Fatigue due to treatment 01/23/2009   VERTIGO 06/27/2008   HEMANGIOMA, HEPATIC 03/24/2007   Dyslipidemia 03/24/2007   Depression 03/24/2007   Essential hypertension 03/24/2007   GASTROESOPHAGEAL REFLUX DISEASE 03/24/2007   RENAL CYST 03/24/2007   Osteoporosis 03/24/2007   SHINGLES, HX OF 03/24/2007   Acquired absence of genital organ 03/24/2007    PCP: Plotnikov, MD  REFERRING PROVIDER: Plotnikov, MD  REFERRING DIAG: sciatica left lower leg pain  Rationale for Evaluation and Treatment: Rehabilitation  THERAPY DIAG:  Muscle weakness (generalized)  Other low back pain  Pain in right lower leg  ONSET DATE: January 2024  SUBJECTIVE:  SUBJECTIVE STATEMENT: She ha sdone th exercises but instead of one leg hurts both legs hurt.   PERTINENT HISTORY:  As above, TIA  PAIN:  Are you having pain? Yes: NPRS scale: 0/10 Pain location: right lower leg anterior pain, slight medial Pain description: weakness in the leg, dull Aggravating factors: after sitting, walk longer periods can be up to 7/10 Relieving factors: tylenol at times pain can be 0/10  PRECAUTIONS: None  WEIGHT BEARING RESTRICTIONS: No  FALLS:  Has patient fallen in last 6 months? Yes. Number of falls 1, reports shoe got caught  LIVING ENVIRONMENT: Lives with: lives alone Lives in: House/apartment Stairs: No Has following equipment at home: None  OCCUPATION: retired  PLOF: Independent and shops two hours at times  PATIENT GOALS: have less pain, be able to walk, feel  better about my   NEXT MD VISIT: July 2024  OBJECTIVE:   DIAGNOSTIC FINDINGS:  none  COGNITION: Overall cognitive status: Within functional limits for tasks assessed     SENSATION: WFL  MUSCLE LENGTH:  POSTURE: rounded shoulders and forward head  PALPATION: Tender left anterior medial shin , some edema in this area, she does mention that the MD felt she had some bursitis  LUMBAR ROM:   AROM eval  Flexion Decreased 25%  Extension Decreased 75%  Right lateral flexion Decreased 75%  Left lateral flexion Decreased 75%  Right rotation   Left rotation    (Blank rows = not tested)  LOWER EXTREMITY ROM:     Active  Right eval Left eval  Hip flexion    Hip extension    Hip abduction    Hip adduction    Hip internal rotation    Hip external rotation    Knee flexion 115   Knee extension 5   Ankle dorsiflexion 0   Ankle plantarflexion    Ankle inversion    Ankle eversion     (Blank rows = not tested)  LOWER EXTREMITY MMT:    MMT Right eval Left eval  Hip flexion 4+   Hip extension    Hip abduction 4-   Hip adduction    Hip internal rotation    Hip external rotation    Knee flexion 4+   Knee extension 4+   Ankle dorsiflexion 3+   Ankle plantarflexion 4   Ankle inversion 3+   Ankle eversion 4    (Blank rows = not tested)   FUNCTIONAL TESTS:  Timed up and go (TUG): 13 3 minute walk test: not tested Berg Balance Scale: 44/56  GAIT: Distance walked: 50 feet Assistive device utilized: None Level of assistance: Complete Independence Comments: tends to be somewhat unsteady, has decreased strength in the right leg and hip and tends to dip on this side with stance phase  TODAY'S TREATMENT:  DATE:  10/15/22 Gait 2 laps for warm up  LAQ 1lb 2x10 HS curls red 2x10 Hip Add ball squeeze 2x15 Hip abd red 2x15 S2S 3 x 5  Alt  4in box  taps Ionto 80mA dose patch on the right medial shin   10/08/22 Ionto 80mA dose patch on the right medial shin, educated on safety and the reason for use to her and her granddaughter, the agreed on treatment.    PATIENT EDUCATION:  Education details: HEP/POC Person educated: Patient and granddaughter Education method: Explanation, Facilities manager, Actor cues, Verbal cues, and Handouts Education comprehension: verbalized understanding, returned demonstration, and verbal cues required  HOME EXERCISE PROGRAM: Access Code: [email or web address] URL: [email or web address] Date: 10/08/2022 Prepared by: Stacie Glaze  Exercises - Toe Raises with Counter Support  - 1 x daily - 7 x weekly - 2 sets - 10 reps - 2 hold - Standing Heel Raise with Chair Support  - 1 x daily - 7 x weekly - 2 sets - 10 reps - 2 hold - Standing Hip Abduction  - 1 x daily - 7 x weekly - 2 sets - 10 reps - 2 hold - Standing Hip Flexion  - 1 x daily - 7 x weekly - 2 sets - 10 reps - 30 hold - Tandem Stance with Chair Support  - 1 x daily - 7 x weekly - 2 sets - 10 reps - 2 hold  ASSESSMENT:  CLINICAL IMPRESSION: Patient is a 89 y.o. female who was seen today for physical therapy treatment for bursitis of the right medial lower leg. Pt enters with reports of decrease pain form the patch. Introduced pt to some low level LE strengthening interventions without issue. Cue needed not to allow LE to press against mat table with sit to stands. Completed alt taps well but reports some instability.   OBJECTIVE IMPAIRMENTS: Abnormal gait, cardiopulmonary status limiting activity, decreased activity tolerance, decreased balance, decreased endurance, decreased mobility, difficulty walking, decreased ROM, decreased strength, increased muscle spasms, impaired flexibility, improper body mechanics, postural dysfunction, and pain.   REHAB POTENTIAL: Good  CLINICAL DECISION MAKING: Stable/uncomplicated  EVALUATION COMPLEXITY:  Low   GOALS: Goals reviewed with patient? Yes  SHORT TERM GOALS: Target date: 10/22/22  Independent with initial HEP Goal status: INITIAL  LONG TERM GOALS: Target date: 01/08/23  Independent with advanced HEP for balance Goal status: INITIAL  2.  Increase Berg balance test to 48/56 to decrease risk for falls Goal status: INITIAL  3.  Increase right ankle DF to 10 degrees Goal status: INITIAL  4.  Increase right ankle and hip strength to 4/5 Goal status: INITIAL  5.  Report pain decreased 50% Goal status: INITIAL  PLAN:  PT FREQUENCY: 1-2x/week  PT DURATION: 12 weeks  PLANNED INTERVENTIONS: Therapeutic exercises, Therapeutic activity, Neuromuscular re-education, Balance training, Gait training, Patient/Family education, Self Care, Joint mobilization, Dry Needling, Electrical stimulation, Cryotherapy, Moist heat, Taping, Ultrasound, Ionotophoresis 4mg /ml Dexamethasone, and Manual therapy.  PLAN FOR NEXT SESSION: ankle ROM, strength and balance   Grayce Sessions, PTA 10/15/2022, 11:47 AM

## 2022-10-22 ENCOUNTER — Encounter: Payer: Self-pay | Admitting: Physical Therapy

## 2022-10-22 ENCOUNTER — Ambulatory Visit: Payer: Medicare Other | Admitting: Physical Therapy

## 2022-10-22 DIAGNOSIS — M79604 Pain in right leg: Secondary | ICD-10-CM | POA: Diagnosis not present

## 2022-10-22 DIAGNOSIS — M5431 Sciatica, right side: Secondary | ICD-10-CM | POA: Diagnosis not present

## 2022-10-22 DIAGNOSIS — M79661 Pain in right lower leg: Secondary | ICD-10-CM

## 2022-10-22 DIAGNOSIS — M6281 Muscle weakness (generalized): Secondary | ICD-10-CM

## 2022-10-22 DIAGNOSIS — M5459 Other low back pain: Secondary | ICD-10-CM | POA: Diagnosis not present

## 2022-10-22 NOTE — Therapy (Signed)
OUTPATIENT PHYSICAL THERAPY THORACOLUMBAR TREATMENT   Patient Name: Cynthia Garza MRN: [id number] DOB:12/19/1932, 89 y.o., female Today's Date: 10/22/2022  END OF SESSION:  PT End of Session - 10/22/22 0930     Visit Number 3    Date for PT Re-Evaluation 01/08/23    Authorization Type [facility]    PT Start Time 0927    PT Stop Time 1015    PT Time Calculation (min) 48 min    Activity Tolerance Patient tolerated treatment well    Behavior During Therapy [facility] for tasks assessed/performed             Past Medical History:  Diagnosis Date   Anxiety    Breast cancer (HCC) 2015   ER+/PR+/Her2-   Depression    Dyslipidemia    GERD (gastroesophageal reflux disease)    Heart murmur    Hemangioma of intra-abdominal structures    HTN (hypertension)    Insomnia    LBP (low back pain)    Osteoporosis    Paresthesia    Radiation 08/08/13-08/29/13   Bilat.breast 42.72 Gy   Renal cyst    Shingles    Past Surgical History:  Procedure Laterality Date   ABDOMINAL HYSTERECTOMY     ?1970's   BREAST LUMPECTOMY WITH NEEDLE LOCALIZATION AND AXILLARY SENTINEL LYMPH NODE BX Bilateral 07/07/2013   Procedure: BREAST LUMPECTOMY WITH NEEDLE LOCALIZATION AND AXILLARY SENTINEL LYMPH NODE BIOPIES;  Surgeon: Robyne Askew, MD;  Location: [facility];  Service: General;  Laterality: Bilateral;   Patient Active Problem List   Diagnosis Date Noted   Bursitis 10/07/2022   Leg cramps 02/12/2021   History of lacunar cerebrovascular accident (CVA) 11/13/2020   Neuropathy 11/12/2020   Statin myopathy 05/14/2020   Grief 05/14/2020   Weakness of right foot 10/12/2019   Cough 07/12/2019   Right sided weakness 10/24/2018   Hemiparesis as late effect of cerebrovascular accident (CVA) (HCC) 10/24/2018   Pain in finger 03/25/2018   TMJ click 12/29/2017   CAP (community acquired pneumonia) 08/14/2017   Epistaxis 01/20/2017   Anxiety 01/20/2017   Arthralgia 09/01/2016   Well adult exam 12/18/2015    Hypokalemia 06/17/2015   Malignant neoplasm of upper-outer quadrant of right breast in female, estrogen receptor positive (HCC) 06/02/2013   Right hip pain 10/22/2012   Sciatica of right side 10/22/2012   Memory problem 02/09/2012   Pyelonephritis 12/04/2011   Insomnia 11/04/2011   Fatigue due to treatment 01/23/2009   VERTIGO 06/27/2008   HEMANGIOMA, HEPATIC 03/24/2007   Dyslipidemia 03/24/2007   Depression 03/24/2007   Essential hypertension 03/24/2007   GASTROESOPHAGEAL REFLUX DISEASE 03/24/2007   RENAL CYST 03/24/2007   Osteoporosis 03/24/2007   SHINGLES, HX OF 03/24/2007   Acquired absence of genital organ 03/24/2007    PCP: Plotnikov, MD  REFERRING PROVIDER: Plotnikov, MD  REFERRING DIAG: sciatica left lower leg pain  Rationale for Evaluation and Treatment: Rehabilitation  THERAPY DIAG:  Muscle weakness (generalized)  Other low back pain  Pain in right lower leg  ONSET DATE: January 2024  SUBJECTIVE:  SUBJECTIVE STATEMENT: Reports that she has been having some cramps in the legs.  Reports some pain   PERTINENT HISTORY:  As above, TIA  PAIN:  Are you having pain? Yes: NPRS scale: 0/10 Pain location: right lower leg anterior pain, slight medial Pain description: weakness in the leg, dull Aggravating factors: after sitting, walk longer periods can be up to 7/10 Relieving factors: tylenol at times pain can be 0/10  PRECAUTIONS: None  WEIGHT BEARING RESTRICTIONS: No  FALLS:  Has patient fallen in last 6 months? Yes. Number of falls 1, reports shoe got caught  LIVING ENVIRONMENT: Lives with: lives alone Lives in: House/apartment Stairs: No Has following equipment at home: None  OCCUPATION: retired  PLOF: Independent and shops two hours at times  PATIENT GOALS: have  less pain, be able to walk, feel better about my   NEXT MD VISIT: July 2024  OBJECTIVE:   DIAGNOSTIC FINDINGS:  none  COGNITION: Overall cognitive status: Within functional limits for tasks assessed     SENSATION: WFL  MUSCLE LENGTH:  POSTURE: rounded shoulders and forward head  PALPATION: Tender left anterior medial shin , some edema in this area, she does mention that the MD felt she had some bursitis  LUMBAR ROM:   AROM eval  Flexion Decreased 25%  Extension Decreased 75%  Right lateral flexion Decreased 75%  Left lateral flexion Decreased 75%  Right rotation   Left rotation    (Blank rows = not tested)  LOWER EXTREMITY ROM:     Active  Right eval Left eval  Hip flexion    Hip extension    Hip abduction    Hip adduction    Hip internal rotation    Hip external rotation    Knee flexion 115   Knee extension 5   Ankle dorsiflexion 0   Ankle plantarflexion    Ankle inversion    Ankle eversion     (Blank rows = not tested)  LOWER EXTREMITY MMT:    MMT Right eval Left eval  Hip flexion 4+   Hip extension    Hip abduction 4-   Hip adduction    Hip internal rotation    Hip external rotation    Knee flexion 4+   Knee extension 4+   Ankle dorsiflexion 3+   Ankle plantarflexion 4   Ankle inversion 3+   Ankle eversion 4    (Blank rows = not tested)   FUNCTIONAL TESTS:  Timed up and go (TUG): 13 3 minute walk test: not tested Berg Balance Scale: 44/56  GAIT: Distance walked: 50 feet Assistive device utilized: None Level of assistance: Complete Independence Comments: tends to be somewhat unsteady, has decreased strength in the right leg and hip and tends to dip on this side with stance phase  TODAY'S TREATMENT:  DATE:  10/22/22 Nustep level 4 x 5 minutes 2.5# LAQ 2.5# marches 2.5# hip abduction 2.5# hip  extension Calf raises Ball toss, on airex ball toss On airex head turns, then with eyes closed Direction changes Stairs step over step On airex cone toe touches Gait outside in the grass  10/15/22 Gait 2 laps for warm up  LAQ 1lb 2x10 HS curls red 2x10 Hip Add ball squeeze 2x15 Hip abd red 2x15 S2S 3 x 5  Alt  4in box taps Ionto 80mA dose patch on the right medial shin   10/08/22 Ionto 80mA dose patch on the right medial shin, educated on safety and the reason for use to her and her granddaughter, the agreed on treatment.    PATIENT EDUCATION:  Education details: HEP/POC Person educated: Patient and granddaughter Education method: Explanation, Facilities manager, Actor cues, Verbal cues, and Handouts Education comprehension: verbalized understanding, returned demonstration, and verbal cues required  HOME EXERCISE PROGRAM: Access Code: [email or web address] URL: [email or web address] Date: 10/08/2022 Prepared by: Stacie Glaze  Exercises - Toe Raises with Counter Support  - 1 x daily - 7 x weekly - 2 sets - 10 reps - 2 hold - Standing Heel Raise with Chair Support  - 1 x daily - 7 x weekly - 2 sets - 10 reps - 2 hold - Standing Hip Abduction  - 1 x daily - 7 x weekly - 2 sets - 10 reps - 2 hold - Standing Hip Flexion  - 1 x daily - 7 x weekly - 2 sets - 10 reps - 30 hold - Tandem Stance with Chair Support  - 1 x daily - 7 x weekly - 2 sets - 10 reps - 2 hold  ASSESSMENT:  CLINICAL IMPRESSION: Patient did very well today, I really tried to start with some exercises for strength and then focused on balance, she had one or two times on higher level balance activities that she needed min/mod A  to right self.  She does report that she feels like the patch helps the pain in the leg   OBJECTIVE IMPAIRMENTS: Abnormal gait, cardiopulmonary status limiting activity, decreased activity tolerance, decreased balance, decreased endurance, decreased mobility, difficulty walking,  decreased ROM, decreased strength, increased muscle spasms, impaired flexibility, improper body mechanics, postural dysfunction, and pain.   REHAB POTENTIAL: Good  CLINICAL DECISION MAKING: Stable/uncomplicated  EVALUATION COMPLEXITY: Low   GOALS: Goals reviewed with patient? Yes  SHORT TERM GOALS: Target date: 10/22/22  Independent with initial HEP Goal status:met 10/22/22  LONG TERM GOALS: Target date: 01/08/23  Independent with advanced HEP for balance Goal status: INITIAL  2.  Increase Berg balance test to 48/56 to decrease risk for falls Goal status: INITIAL  3.  Increase right ankle DF to 10 degrees Goal status: INITIAL  4.  Increase right ankle and hip strength to 4/5 Goal status: INITIAL  5.  Report pain decreased 50% Goal status: INITIAL  PLAN:  PT FREQUENCY: 1-2x/week  PT DURATION: 12 weeks  PLANNED INTERVENTIONS: Therapeutic exercises, Therapeutic activity, Neuromuscular re-education, Balance training, Gait training, Patient/Family education, Self Care, Joint mobilization, Dry Needling, Electrical stimulation, Cryotherapy, Moist heat, Taping, Ultrasound, Ionotophoresis 4mg /ml Dexamethasone, and Manual therapy.  PLAN FOR NEXT SESSION: ankle ROM, strength and balance   Sherrin Stahle W, PT 10/22/2022, 9:31 AM

## 2022-10-29 ENCOUNTER — Encounter: Payer: Self-pay | Admitting: Physical Therapy

## 2022-10-29 ENCOUNTER — Ambulatory Visit: Payer: Medicare Other | Attending: Internal Medicine | Admitting: Physical Therapy

## 2022-10-29 DIAGNOSIS — M5459 Other low back pain: Secondary | ICD-10-CM | POA: Diagnosis not present

## 2022-10-29 DIAGNOSIS — M6281 Muscle weakness (generalized): Secondary | ICD-10-CM | POA: Diagnosis not present

## 2022-10-29 DIAGNOSIS — M79661 Pain in right lower leg: Secondary | ICD-10-CM | POA: Diagnosis not present

## 2022-10-29 NOTE — Therapy (Signed)
OUTPATIENT PHYSICAL THERAPY THORACOLUMBAR TREATMENT   Patient Name: Cynthia Garza MRN: [id number] DOB:Oct 08, 1932, 89 y.o., female Today's Date: 10/29/2022  END OF SESSION:  PT End of Session - 10/29/22 1103     Visit Number 4    Date for PT Re-Evaluation 01/08/23    PT Start Time 1100    PT Stop Time 1145    PT Time Calculation (min) 45 min    Activity Tolerance Patient tolerated treatment well    Behavior During Therapy [facility] for tasks assessed/performed             Past Medical History:  Diagnosis Date   Anxiety    Breast cancer (HCC) 2015   ER+/PR+/Her2-   Depression    Dyslipidemia    GERD (gastroesophageal reflux disease)    Heart murmur    Hemangioma of intra-abdominal structures    HTN (hypertension)    Insomnia    LBP (low back pain)    Osteoporosis    Paresthesia    Radiation 08/08/13-08/29/13   Bilat.breast 42.72 Gy   Renal cyst    Shingles    Past Surgical History:  Procedure Laterality Date   ABDOMINAL HYSTERECTOMY     ?1970's   BREAST LUMPECTOMY WITH NEEDLE LOCALIZATION AND AXILLARY SENTINEL LYMPH NODE BX Bilateral 07/07/2013   Procedure: BREAST LUMPECTOMY WITH NEEDLE LOCALIZATION AND AXILLARY SENTINEL LYMPH NODE BIOPIES;  Surgeon: Robyne Askew, MD;  Location: [facility] OR;  Service: General;  Laterality: Bilateral;   Patient Active Problem List   Diagnosis Date Noted   Bursitis 10/07/2022   Leg cramps 02/12/2021   History of lacunar cerebrovascular accident (CVA) 11/13/2020   Neuropathy 11/12/2020   Statin myopathy 05/14/2020   Grief 05/14/2020   Weakness of right foot 10/12/2019   Cough 07/12/2019   Right sided weakness 10/24/2018   Hemiparesis as late effect of cerebrovascular accident (CVA) (HCC) 10/24/2018   Pain in finger 03/25/2018   TMJ click 12/29/2017   CAP (community acquired pneumonia) 08/14/2017   Epistaxis 01/20/2017   Anxiety 01/20/2017   Arthralgia 09/01/2016   Well adult exam 12/18/2015   Hypokalemia 06/17/2015   Malignant  neoplasm of upper-outer quadrant of right breast in female, estrogen receptor positive (HCC) 06/02/2013   Right hip pain 10/22/2012   Sciatica of right side 10/22/2012   Memory problem 02/09/2012   Pyelonephritis 12/04/2011   Insomnia 11/04/2011   Fatigue due to treatment 01/23/2009   VERTIGO 06/27/2008   HEMANGIOMA, HEPATIC 03/24/2007   Dyslipidemia 03/24/2007   Depression 03/24/2007   Essential hypertension 03/24/2007   GASTROESOPHAGEAL REFLUX DISEASE 03/24/2007   RENAL CYST 03/24/2007   Osteoporosis 03/24/2007   SHINGLES, HX OF 03/24/2007   Acquired absence of genital organ 03/24/2007    PCP: Plotnikov, MD  REFERRING PROVIDER: Plotnikov, MD  REFERRING DIAG: sciatica left lower leg pain  Rationale for Evaluation and Treatment: Rehabilitation  THERAPY DIAG:  Muscle weakness (generalized)  Pain in right lower leg  Other low back pain  ONSET DATE: January 2024  SUBJECTIVE:  SUBJECTIVE STATEMENT: Not one of her better days, had some trouble sleeping. No leg pain or cramps this week  PERTINENT HISTORY:  As above, TIA  PAIN:  Are you having pain? Yes: NPRS scale: 0/10 Pain location: right lower leg anterior pain, slight medial Pain description: weakness in the leg, dull Aggravating factors: after sitting, walk longer periods can be up to 7/10 Relieving factors: tylenol at times pain can be 0/10  PRECAUTIONS: None  WEIGHT BEARING RESTRICTIONS: No  FALLS:  Has patient fallen in last 6 months? Yes. Number of falls 1, reports shoe got caught  LIVING ENVIRONMENT: Lives with: lives alone Lives in: House/apartment Stairs: No Has following equipment at home: None  OCCUPATION: retired  PLOF: Independent and shops two hours at times  PATIENT GOALS: have less pain, be able to walk,  feel better about my   NEXT MD VISIT: July 2024  OBJECTIVE:   DIAGNOSTIC FINDINGS:  none  COGNITION: Overall cognitive status: Within functional limits for tasks assessed     SENSATION: WFL  MUSCLE LENGTH:  POSTURE: rounded shoulders and forward head  PALPATION: Tender left anterior medial shin , some edema in this area, she does mention that the MD felt she had some bursitis  LUMBAR ROM:   AROM eval  Flexion Decreased 25%  Extension Decreased 75%  Right lateral flexion Decreased 75%  Left lateral flexion Decreased 75%  Right rotation   Left rotation    (Blank rows = not tested)  LOWER EXTREMITY ROM:     Active  Right eval Left eval  Hip flexion    Hip extension    Hip abduction    Hip adduction    Hip internal rotation    Hip external rotation    Knee flexion 115   Knee extension 5   Ankle dorsiflexion 0   Ankle plantarflexion    Ankle inversion    Ankle eversion     (Blank rows = not tested)  LOWER EXTREMITY MMT:    MMT Right eval Left eval  Hip flexion 4+   Hip extension    Hip abduction 4-   Hip adduction    Hip internal rotation    Hip external rotation    Knee flexion 4+   Knee extension 4+   Ankle dorsiflexion 3+   Ankle plantarflexion 4   Ankle inversion 3+   Ankle eversion 4    (Blank rows = not tested)   FUNCTIONAL TESTS:  Timed up and go (TUG): 13 3 minute walk test: not tested Berg Balance Scale: 44/56  GAIT: Distance walked: 50 feet Assistive device utilized: None Level of assistance: Complete Independence Comments: tends to be somewhat unsteady, has decreased strength in the right leg and hip and tends to dip on this side with stance phase  TODAY'S TREATMENT:  DATE:  10/29/22 NuStep L4 x6 min 3lb LAQ 2x10 3lb seated march 2x10 Seated Rows red 2x10 Standing shoulder Ext red 2x10 Seated HS curls  red 2x10  Alt 6in box taps x10 then on airex x10 Standing on airex eyes closed  Side step length of mat table  Standing march on airex 2x10  Hip add ball squeeze     10/22/22 Nustep level 4 x 5 minutes 2.5# LAQ 2.5# marches 2.5# hip abduction 2.5# hip extension Calf raises Ball toss, on airex ball toss On airex head turns, then with eyes closed Direction changes Stairs step over step On airex cone toe touches Gait outside in the grass  10/15/22 Gait 2 laps for warm up  LAQ 1lb 2x10 HS curls red 2x10 Hip Add ball squeeze 2x15 Hip abd red 2x15 S2S 3 x 5  Alt  4in box taps Ionto 80mA dose patch on the right medial shin   10/08/22 Ionto 80mA dose patch on the right medial shin, educated on safety and the reason for use to her and her granddaughter, the agreed on treatment.    PATIENT EDUCATION:  Education details: HEP/POC Person educated: Patient and granddaughter Education method: Explanation, Facilities manager, Actor cues, Verbal cues, and Handouts Education comprehension: verbalized understanding, returned demonstration, and verbal cues required  HOME EXERCISE PROGRAM: Access Code: [email or web address] URL: [email or web address] Date: 10/08/2022 Prepared by: Stacie Glaze  Exercises - Toe Raises with Counter Support  - 1 x daily - 7 x weekly - 2 sets - 10 reps - 2 hold - Standing Heel Raise with Chair Support  - 1 x daily - 7 x weekly - 2 sets - 10 reps - 2 hold - Standing Hip Abduction  - 1 x daily - 7 x weekly - 2 sets - 10 reps - 2 hold - Standing Hip Flexion  - 1 x daily - 7 x weekly - 2 sets - 10 reps - 30 hold - Tandem Stance with Chair Support  - 1 x daily - 7 x weekly - 2 sets - 10 reps - 2 hold  ASSESSMENT:  CLINICAL IMPRESSION: Pt continues to do really well in therapy. Increase resistance and or reps tolerated with interventions. Pt voiced and demoed some instability on airex pad requiring min assist at times. No pain reported during session. Cue to  hold contraction with LAQ.   OBJECTIVE IMPAIRMENTS: Abnormal gait, cardiopulmonary status limiting activity, decreased activity tolerance, decreased balance, decreased endurance, decreased mobility, difficulty walking, decreased ROM, decreased strength, increased muscle spasms, impaired flexibility, improper body mechanics, postural dysfunction, and pain.   REHAB POTENTIAL: Good  CLINICAL DECISION MAKING: Stable/uncomplicated  EVALUATION COMPLEXITY: Low   GOALS: Goals reviewed with patient? Yes  SHORT TERM GOALS: Target date: 10/22/22  Independent with initial HEP Goal status:met 10/22/22  LONG TERM GOALS: Target date: 01/08/23  Independent with advanced HEP for balance Goal status: INITIAL  2.  Increase Berg balance test to 48/56 to decrease risk for falls Goal status: INITIAL  3.  Increase right ankle DF to 10 degrees Goal status: INITIAL  4.  Increase right ankle and hip strength to 4/5 Goal status: INITIAL  5.  Report pain decreased 50% Goal status: INITIAL  PLAN:  PT FREQUENCY: 1-2x/week  PT DURATION: 12 weeks  PLANNED INTERVENTIONS: Therapeutic exercises, Therapeutic activity, Neuromuscular re-education, Balance training, Gait training, Patient/Family education, Self Care, Joint mobilization, Dry Needling, Electrical stimulation, Cryotherapy, Moist heat, Taping, Ultrasound, Ionotophoresis 4mg /ml Dexamethasone, and Manual therapy.  PLAN FOR NEXT SESSION: ankle ROM,  strength and balance   Grayce Sessions, PTA 10/29/2022, 11:03 AM

## 2022-11-05 ENCOUNTER — Ambulatory Visit: Payer: Medicare Other | Admitting: Physical Therapy

## 2022-11-05 ENCOUNTER — Encounter: Payer: Self-pay | Admitting: Physical Therapy

## 2022-11-05 DIAGNOSIS — M79661 Pain in right lower leg: Secondary | ICD-10-CM | POA: Diagnosis not present

## 2022-11-05 DIAGNOSIS — M5459 Other low back pain: Secondary | ICD-10-CM | POA: Diagnosis not present

## 2022-11-05 DIAGNOSIS — M6281 Muscle weakness (generalized): Secondary | ICD-10-CM

## 2022-11-05 NOTE — Therapy (Signed)
OUTPATIENT PHYSICAL THERAPY THORACOLUMBAR TREATMENT   Patient Name: Cynthia Garza MRN: [id number] DOB:01/06/1933, 89 y.o., female Today's Date: 11/05/2022  END OF SESSION:  PT End of Session - 11/05/22 1056     Visit Number 5    Date for PT Re-Evaluation 01/08/23    Authorization Type [facility]    PT Start Time 1056    PT Stop Time 1140    PT Time Calculation (min) 44 min    Activity Tolerance Patient tolerated treatment well    Behavior During Therapy WFL for tasks assessed/performed             Past Medical History:  Diagnosis Date   Anxiety    Breast cancer (HCC) 2015   ER+/PR+/Her2-   Depression    Dyslipidemia    GERD (gastroesophageal reflux disease)    Heart murmur    Hemangioma of intra-abdominal structures    HTN (hypertension)    Insomnia    LBP (low back pain)    Osteoporosis    Paresthesia    Radiation 08/08/13-08/29/13   Bilat.breast 42.72 Gy   Renal cyst    Shingles    Past Surgical History:  Procedure Laterality Date   ABDOMINAL HYSTERECTOMY     ?1970's   BREAST LUMPECTOMY WITH NEEDLE LOCALIZATION AND AXILLARY SENTINEL LYMPH NODE BX Bilateral 07/07/2013   Procedure: BREAST LUMPECTOMY WITH NEEDLE LOCALIZATION AND AXILLARY SENTINEL LYMPH NODE BIOPIES;  Surgeon: Robyne Askew, MD;  Location: [facility];  Service: General;  Laterality: Bilateral;   Patient Active Problem List   Diagnosis Date Noted   Bursitis 10/07/2022   Leg cramps 02/12/2021   History of lacunar cerebrovascular accident (CVA) 11/13/2020   Neuropathy 11/12/2020   Statin myopathy 05/14/2020   Grief 05/14/2020   Weakness of right foot 10/12/2019   Cough 07/12/2019   Right sided weakness 10/24/2018   Hemiparesis as late effect of cerebrovascular accident (CVA) (HCC) 10/24/2018   Pain in finger 03/25/2018   TMJ click 12/29/2017   CAP (community acquired pneumonia) 08/14/2017   Epistaxis 01/20/2017   Anxiety 01/20/2017   Arthralgia 09/01/2016   Well adult exam 12/18/2015    Hypokalemia 06/17/2015   Malignant neoplasm of upper-outer quadrant of right breast in female, estrogen receptor positive (HCC) 06/02/2013   Right hip pain 10/22/2012   Sciatica of right side 10/22/2012   Memory problem 02/09/2012   Pyelonephritis 12/04/2011   Insomnia 11/04/2011   Fatigue due to treatment 01/23/2009   VERTIGO 06/27/2008   HEMANGIOMA, HEPATIC 03/24/2007   Dyslipidemia 03/24/2007   Depression 03/24/2007   Essential hypertension 03/24/2007   GASTROESOPHAGEAL REFLUX DISEASE 03/24/2007   RENAL CYST 03/24/2007   Osteoporosis 03/24/2007   SHINGLES, HX OF 03/24/2007   Acquired absence of genital organ 03/24/2007    PCP: Plotnikov, MD  REFERRING PROVIDER: Plotnikov, MD  REFERRING DIAG: sciatica left lower leg pain  Rationale for Evaluation and Treatment: Rehabilitation  THERAPY DIAG:  Muscle weakness (generalized)  Pain in right lower leg  Other low back pain  ONSET DATE: January 2024  SUBJECTIVE:  SUBJECTIVE STATEMENT: No pain recently, less sensitive.  I still feel off balance at times.    PERTINENT HISTORY:  As above, TIA  PAIN:  Are you having pain? Yes: NPRS scale: 0/10 Pain location: right lower leg anterior pain, slight medial Pain description: weakness in the leg, dull Aggravating factors: after sitting, walk longer periods can be up to 7/10 Relieving factors: tylenol at times pain can be 0/10  PRECAUTIONS: None  WEIGHT BEARING RESTRICTIONS: No  FALLS:  Has patient fallen in last 6 months? Yes. Number of falls 1, reports shoe got caught  LIVING ENVIRONMENT: Lives with: lives alone Lives in: House/apartment Stairs: No Has following equipment at home: None  OCCUPATION: retired  PLOF: Independent and shops two hours at times  PATIENT GOALS: have less  pain, be able to walk, feel better about my   NEXT MD VISIT: July 2024  OBJECTIVE:   DIAGNOSTIC FINDINGS:  none  COGNITION: Overall cognitive status: Within functional limits for tasks assessed     SENSATION: WFL  MUSCLE LENGTH:  POSTURE: rounded shoulders and forward head  PALPATION: Tender left anterior medial shin , some edema in this area, she does mention that the MD felt she had some bursitis  LUMBAR ROM:   AROM eval  Flexion Decreased 25%  Extension Decreased 75%  Right lateral flexion Decreased 75%  Left lateral flexion Decreased 75%  Right rotation   Left rotation    (Blank rows = not tested)  LOWER EXTREMITY ROM:     Active  Right eval Left eval  Hip flexion    Hip extension    Hip abduction    Hip adduction    Hip internal rotation    Hip external rotation    Knee flexion 115   Knee extension 5   Ankle dorsiflexion 0   Ankle plantarflexion    Ankle inversion    Ankle eversion     (Blank rows = not tested)  LOWER EXTREMITY MMT:    MMT Right eval Left eval  Hip flexion 4+   Hip extension    Hip abduction 4-   Hip adduction    Hip internal rotation    Hip external rotation    Knee flexion 4+   Knee extension 4+   Ankle dorsiflexion 3+   Ankle plantarflexion 4   Ankle inversion 3+   Ankle eversion 4    (Blank rows = not tested)   FUNCTIONAL TESTS:  Timed up and go (TUG): 13  11/05/22 = 12 seconds 3 minute walk test: not tested Berg Balance Scale: 44/56   11/05/22 = 49/56  GAIT: Distance walked: 50 feet Assistive device utilized: None Level of assistance: Complete Independence Comments: tends to be somewhat unsteady, has decreased strength in the right leg and hip and tends to dip on this side with stance phase  TODAY'S TREATMENT:  DATE:  11/05/22 [name] 49/56 Walking ball toss [employer] ball   Step hit with ball Step reach Direction changes TUG 12 seconds Nustep level 5 x 5 minutes HS curls 20# Side step on airex balance beam 5# leg extension 2 x10 2.5# march, hip abduction, extension  10/29/22 NuStep L4 x6 min 3lb LAQ 2x10 3lb seated march 2x10 Seated Rows red 2x10 Standing shoulder Ext red 2x10 Seated HS curls red 2x10  Alt 6in box taps x10 then on airex x10 Standing on airex eyes closed  Side step length of mat table  Standing march on airex 2x10  Hip add ball squeeze    10/22/22 Nustep level 4 x 5 minutes 2.5# LAQ 2.5# marches 2.5# hip abduction 2.5# hip extension Calf raises Ball toss, on airex ball toss On airex head turns, then with eyes closed Direction changes Stairs step over step On airex cone toe touches Gait outside in the grass  10/15/22 Gait 2 laps for warm up  LAQ 1lb 2x10 HS curls red 2x10 Hip Add ball squeeze 2x15 Hip abd red 2x15 S2S 3 x 5  Alt  4in box taps Ionto 80mA dose patch on the right medial shin   10/08/22 Ionto 80mA dose patch on the right medial shin, educated on safety and the reason for use to her and her granddaughter, the agreed on treatment.    PATIENT EDUCATION:  Education details: HEP/POC Person educated: Patient and granddaughter Education method: Explanation, Facilities manager, Actor cues, Verbal cues, and Handouts Education comprehension: verbalized understanding, returned demonstration, and verbal cues required  HOME EXERCISE PROGRAM: Access Code: [email or web address] URL: [email or web address] Date: 10/08/2022 Prepared by: Stacie Glaze  Exercises - Toe Raises with Counter Support  - 1 x daily - 7 x weekly - 2 sets - 10 reps - 2 hold - Standing Heel Raise with Chair Support  - 1 x daily - 7 x weekly - 2 sets - 10 reps - 2 hold - Standing Hip Abduction  - 1 x daily - 7 x weekly - 2 sets - 10 reps - 2 hold - Standing Hip Flexion  - 1 x daily - 7 x weekly - 2 sets - 10 reps - 30 hold - Tandem Stance  with Chair Support  - 1 x daily - 7 x weekly - 2 sets - 10 reps - 2 hold  ASSESSMENT:  CLINICAL IMPRESSION: Pt with decreased time on the TUG, increased Berg score so she should be much safer and have less likelihood to fall.  She still reports having some feeling of off balance when out shopping and weakness in the right leg   OBJECTIVE IMPAIRMENTS: Abnormal gait, cardiopulmonary status limiting activity, decreased activity tolerance, decreased balance, decreased endurance, decreased mobility, difficulty walking, decreased ROM, decreased strength, increased muscle spasms, impaired flexibility, improper body mechanics, postural dysfunction, and pain.   REHAB POTENTIAL: Good  CLINICAL DECISION MAKING: Stable/uncomplicated  EVALUATION COMPLEXITY: Low   GOALS: Goals reviewed with patient? Yes  SHORT TERM GOALS: Target date: 10/22/22  Independent with initial HEP Goal status:met 10/22/22  LONG TERM GOALS: Target date: 01/08/23  Independent with advanced HEP for balance Goal status: INITIAL  2.  Increase Berg balance test to 48/56 to decrease risk for falls Goal status:met 11/05/22 49/56  3.  Increase right ankle DF to 10 degrees Goal status: INITIAL  4.  Increase right ankle and hip strength to 4/5 Goal status: INITIAL  5.  Report pain decreased 50% Goal status: met 11/05/22  PLAN:  PT FREQUENCY: 1-2x/week  PT DURATION: 12 weeks  PLANNED INTERVENTIONS: Therapeutic exercises, Therapeutic activity, Neuromuscular re-education, Balance training, Gait training, Patient/Family education, Self Care, Joint mobilization, Dry Needling, Electrical stimulation, Cryotherapy, Moist heat, Taping, Ultrasound, Ionotophoresis 4mg /ml Dexamethasone, and Manual therapy.  PLAN FOR NEXT SESSION: 2 LTG's met, may skip next week and then see at least 2 more times see how she does after she goes out of town    [place], PT 11/05/2022, 10:57 AM

## 2022-11-18 ENCOUNTER — Other Ambulatory Visit: Payer: Self-pay | Admitting: Internal Medicine

## 2022-11-19 ENCOUNTER — Ambulatory Visit: Payer: Medicare Other | Admitting: Physical Therapy

## 2022-11-19 ENCOUNTER — Encounter: Payer: Self-pay | Admitting: Physical Therapy

## 2022-11-19 DIAGNOSIS — M79661 Pain in right lower leg: Secondary | ICD-10-CM

## 2022-11-19 DIAGNOSIS — M6281 Muscle weakness (generalized): Secondary | ICD-10-CM | POA: Diagnosis not present

## 2022-11-19 DIAGNOSIS — M5459 Other low back pain: Secondary | ICD-10-CM

## 2022-11-19 NOTE — Therapy (Signed)
OUTPATIENT PHYSICAL THERAPY THORACOLUMBAR TREATMENT   Patient Name: Cynthia Garza MRN: [id number] DOB:[birth year], 89 y.o., female Today's Date: 11/19/2022  END OF SESSION:  PT End of Session - 11/19/22 0845     Visit Number 6    Date for PT Re-Evaluation 01/08/23    Authorization Type [facility]    PT Start Time 0845    PT Stop Time 0930    PT Time Calculation (min) 45 min    Activity Tolerance Patient tolerated treatment well    Behavior During Therapy [facility] for tasks assessed/performed             Past Medical History:  Diagnosis Date   Anxiety    Breast cancer (HCC) 2015   ER+/PR+/Her2-   Depression    Dyslipidemia    GERD (gastroesophageal reflux disease)    Heart murmur    Hemangioma of intra-abdominal structures    HTN (hypertension)    Insomnia    LBP (low back pain)    Osteoporosis    Paresthesia    Radiation 08/08/13-08/29/13   Bilat.breast 42.72 Gy   Renal cyst    Shingles    Past Surgical History:  Procedure Laterality Date   ABDOMINAL HYSTERECTOMY     ?1970's   BREAST LUMPECTOMY WITH NEEDLE LOCALIZATION AND AXILLARY SENTINEL LYMPH NODE BX Bilateral 07/07/2013   Procedure: BREAST LUMPECTOMY WITH NEEDLE LOCALIZATION AND AXILLARY SENTINEL LYMPH NODE BIOPIES;  Surgeon: Robyne Askew, MD;  Location: [facility] OR;  Service: General;  Laterality: Bilateral;   Patient Active Problem List   Diagnosis Date Noted   Bursitis 10/07/2022   Leg cramps 02/12/2021   History of lacunar cerebrovascular accident (CVA) 11/13/2020   Neuropathy 11/12/2020   Statin myopathy 05/14/2020   Grief 05/14/2020   Weakness of right foot 10/12/2019   Cough 07/12/2019   Right sided weakness 10/24/2018   Hemiparesis as late effect of cerebrovascular accident (CVA) (HCC) 10/24/2018   Pain in finger 03/25/2018   TMJ click 12/29/2017   CAP (community acquired pneumonia) 08/14/2017   Epistaxis 01/20/2017   Anxiety 01/20/2017   Arthralgia 09/01/2016   Well adult exam 12/18/2015    Hypokalemia 06/17/2015   Malignant neoplasm of upper-outer quadrant of right breast in female, estrogen receptor positive (HCC) 06/02/2013   Right hip pain 10/22/2012   Sciatica of right side 10/22/2012   Memory problem 02/09/2012   Pyelonephritis 12/04/2011   Insomnia 11/04/2011   Fatigue due to treatment 01/23/2009   VERTIGO 06/27/2008   HEMANGIOMA, HEPATIC 03/24/2007   Dyslipidemia 03/24/2007   Depression 03/24/2007   Essential hypertension 03/24/2007   GASTROESOPHAGEAL REFLUX DISEASE 03/24/2007   RENAL CYST 03/24/2007   Osteoporosis 03/24/2007   SHINGLES, HX OF 03/24/2007   Acquired absence of genital organ 03/24/2007    PCP: Plotnikov, MD  REFERRING PROVIDER: Plotnikov, MD  REFERRING DIAG: sciatica left lower leg pain  Rationale for Evaluation and Treatment: Rehabilitation  THERAPY DIAG:  Muscle weakness (generalized)  Pain in right lower leg  Other low back pain  ONSET DATE: January 2024  SUBJECTIVE:  SUBJECTIVE STATEMENT: I have been doing some things and went out of town, I was really sore after the last treatment, I still feel off balance  PERTINENT HISTORY:  As above, TIA  PAIN:  Are you having pain? Yes: NPRS scale: 0/10 Pain location: right lower leg anterior pain, slight medial Pain description: weakness in the leg, dull Aggravating factors: after sitting, walk longer periods can be up to 7/10 Relieving factors: tylenol at times pain can be 0/10  PRECAUTIONS: None  WEIGHT BEARING RESTRICTIONS: No  FALLS:  Has patient fallen in last 6 months? Yes. Number of falls 1, reports shoe got caught  LIVING ENVIRONMENT: Lives with: lives alone Lives in: House/apartment Stairs: No Has following equipment at home: None  OCCUPATION: retired  PLOF: Independent and shops  two hours at times  PATIENT GOALS: have less pain, be able to walk, feel better about my   NEXT MD VISIT: July 2024  OBJECTIVE:   DIAGNOSTIC FINDINGS:  none  COGNITION: Overall cognitive status: Within functional limits for tasks assessed     SENSATION: WFL  MUSCLE LENGTH:  POSTURE: rounded shoulders and forward head  PALPATION: Tender left anterior medial shin , some edema in this area, she does mention that the MD felt she had some bursitis  LUMBAR ROM:   AROM eval  Flexion Decreased 25%  Extension Decreased 75%  Right lateral flexion Decreased 75%  Left lateral flexion Decreased 75%  Right rotation   Left rotation    (Blank rows = not tested)  LOWER EXTREMITY ROM:     Active  Right eval Left eval  Hip flexion    Hip extension    Hip abduction    Hip adduction    Hip internal rotation    Hip external rotation    Knee flexion 115   Knee extension 5   Ankle dorsiflexion 0   Ankle plantarflexion    Ankle inversion    Ankle eversion     (Blank rows = not tested)  LOWER EXTREMITY MMT:    MMT Right eval Left eval  Hip flexion 4+   Hip extension    Hip abduction 4-   Hip adduction    Hip internal rotation    Hip external rotation    Knee flexion 4+   Knee extension 4+   Ankle dorsiflexion 3+   Ankle plantarflexion 4   Ankle inversion 3+   Ankle eversion 4    (Blank rows = not tested)   FUNCTIONAL TESTS:  Timed up and go (TUG): 13  11/05/22 = 12 seconds 3 minute walk test: not tested Berg Balance Scale: 44/56   11/05/22 = 49/56  GAIT: Distance walked: 50 feet Assistive device utilized: None Level of assistance: Complete Independence Comments: tends to be somewhat unsteady, has decreased strength in the right leg and hip and tends to dip on this side with stance phase  TODAY'S TREATMENT:  DATE:  11/19/22 Bike level 3  x 5 minutes 15# HS curls 3x10 5# 2x10 leg extension 20# resisted gait fwd and backward, 10# side stepping  Side step on and off airex On airex ball toss Walking ball toss 3# hip abduction and marches and extension Direction changes  11/05/22 Cynthia Garza 49/56 Walking ball toss [employer] ball  Step hit with ball Step reach Direction changes TUG 12 seconds Nustep level 5 x 5 minutes HS curls 20# Side step on airex balance beam 5# leg extension 2 x10 2.5# march, hip abduction, extension  10/29/22 NuStep L4 x6 min 3lb LAQ 2x10 3lb seated march 2x10 Seated Rows red 2x10 Standing shoulder Ext red 2x10 Seated HS curls red 2x10  Alt 6in box taps x10 then on airex x10 Standing on airex eyes closed  Side step length of mat table  Standing march on airex 2x10  Hip add ball squeeze    10/22/22 Nustep level 4 x 5 minutes 2.5# LAQ 2.5# marches 2.5# hip abduction 2.5# hip extension Calf raises Ball toss, on airex ball toss On airex head turns, then with eyes closed Direction changes Stairs step over step On airex cone toe touches Gait outside in the grass  10/15/22 Gait 2 laps for warm up  LAQ 1lb 2x10 HS curls red 2x10 Hip Add ball squeeze 2x15 Hip abd red 2x15 S2S 3 x 5  Alt  4in box taps Ionto 80mA dose patch on the right medial shin   10/08/22 Ionto 80mA dose patch on the right medial shin, educated on safety and the reason for use to her and her granddaughter, the agreed on treatment.    PATIENT EDUCATION:  Education details: HEP/POC Person educated: Patient and granddaughter Education method: Explanation, Facilities manager, Actor cues, Verbal cues, and Handouts Education comprehension: verbalized understanding, returned demonstration, and verbal cues required  HOME EXERCISE PROGRAM: Access Code: [email or web address] URL: [email or web address] Date: 10/08/2022 Prepared by: Stacie Glaze  Exercises - Toe Raises with Counter Support  - 1 x daily - 7 x  weekly - 2 sets - 10 reps - 2 hold - Standing Heel Raise with Chair Support  - 1 x daily - 7 x weekly - 2 sets - 10 reps - 2 hold - Standing Hip Abduction  - 1 x daily - 7 x weekly - 2 sets - 10 reps - 2 hold - Standing Hip Flexion  - 1 x daily - 7 x weekly - 2 sets - 10 reps - 30 hold - Tandem Stance with Chair Support  - 1 x daily - 7 x weekly - 2 sets - 10 reps - 2 hold  ASSESSMENT:  CLINICAL IMPRESSION: Pt has not been in to see [place] in two weeks reports that she was out of town, reports that she has only mild soreness at times now with the shin area, she reports that she was able to visit a friend with out much difficulty, she reports that the biggest issue is her balance and it takes time for her to get going in the morning and this is when she feels the most off balance   OBJECTIVE IMPAIRMENTS: Abnormal gait, cardiopulmonary status limiting activity, decreased activity tolerance, decreased balance, decreased endurance, decreased mobility, difficulty walking, decreased ROM, decreased strength, increased muscle spasms, impaired flexibility, improper body mechanics, postural dysfunction, and pain.   REHAB POTENTIAL: Good  CLINICAL DECISION MAKING: Stable/uncomplicated  EVALUATION COMPLEXITY: Low   GOALS: Goals reviewed with patient? Yes  SHORT TERM GOALS: Target date: 10/22/22  Independent with initial HEP Goal status:met 10/22/22  LONG TERM GOALS: Target date: 01/08/23  Independent with advanced HEP for balance Goal status: progressing 11/19/22  2.  Increase Berg balance test to 48/56 to decrease risk for falls Goal status:met 11/05/22 49/56  3.  Increase right ankle DF to 10 degrees Goal status: met 11/19/22  4.  Increase right ankle and hip strength to 4/5 Goal status: progressing 11/17/22  5.  Report pain decreased 50% Goal status: met 11/05/22  PLAN:  PT FREQUENCY: 1-2x/week  PT DURATION: 12 weeks  PLANNED INTERVENTIONS: Therapeutic exercises, Therapeutic activity,  Neuromuscular re-education, Balance training, Gait training, Patient/Family education, Self Care, Joint mobilization, Dry Needling, Electrical stimulation, Cryotherapy, Moist heat, Taping, Ultrasound, Ionotophoresis 4mg /ml Dexamethasone, and Manual therapy.  PLAN FOR NEXT SESSION: Doing very well, next visit assess and see if she needs to continue   [employer], PT 11/19/2022, 8:46 AM

## 2022-11-26 ENCOUNTER — Ambulatory Visit: Payer: Medicare Other | Admitting: Physical Therapy

## 2022-12-01 ENCOUNTER — Ambulatory Visit: Payer: Medicare Other | Admitting: Internal Medicine

## 2022-12-03 ENCOUNTER — Other Ambulatory Visit: Payer: Self-pay | Admitting: Internal Medicine

## 2022-12-16 ENCOUNTER — Other Ambulatory Visit: Payer: Self-pay | Admitting: Internal Medicine

## 2022-12-16 ENCOUNTER — Telehealth: Payer: Self-pay | Admitting: Adult Health

## 2022-12-16 NOTE — Telephone Encounter (Signed)
Spoke with patient confirming upcoming appointment  

## 2022-12-17 DIAGNOSIS — R921 Mammographic calcification found on diagnostic imaging of breast: Secondary | ICD-10-CM | POA: Diagnosis not present

## 2022-12-17 DIAGNOSIS — R922 Inconclusive mammogram: Secondary | ICD-10-CM | POA: Diagnosis not present

## 2022-12-24 ENCOUNTER — Encounter: Payer: Medicare Other | Admitting: Adult Health

## 2023-01-06 ENCOUNTER — Inpatient Hospital Stay: Payer: Medicare Other | Attending: Adult Health | Admitting: Adult Health

## 2023-01-29 DIAGNOSIS — H353132 Nonexudative age-related macular degeneration, bilateral, intermediate dry stage: Secondary | ICD-10-CM | POA: Diagnosis not present

## 2023-02-16 ENCOUNTER — Other Ambulatory Visit: Payer: Self-pay | Admitting: Internal Medicine

## 2023-03-12 ENCOUNTER — Ambulatory Visit: Payer: Medicare Other | Admitting: Internal Medicine

## 2023-03-12 ENCOUNTER — Encounter: Payer: Self-pay | Admitting: Internal Medicine

## 2023-03-12 ENCOUNTER — Other Ambulatory Visit: Payer: Self-pay | Admitting: Internal Medicine

## 2023-03-12 VITALS — BP 122/68 | HR 72 | Temp 97.9°F | Ht 61.0 in | Wt 142.4 lb

## 2023-03-12 DIAGNOSIS — E785 Hyperlipidemia, unspecified: Secondary | ICD-10-CM | POA: Diagnosis not present

## 2023-03-12 DIAGNOSIS — Z23 Encounter for immunization: Secondary | ICD-10-CM | POA: Diagnosis not present

## 2023-03-12 DIAGNOSIS — Z8673 Personal history of transient ischemic attack (TIA), and cerebral infarction without residual deficits: Secondary | ICD-10-CM

## 2023-03-12 DIAGNOSIS — I10 Essential (primary) hypertension: Secondary | ICD-10-CM

## 2023-03-12 DIAGNOSIS — I69359 Hemiplegia and hemiparesis following cerebral infarction affecting unspecified side: Secondary | ICD-10-CM

## 2023-03-12 DIAGNOSIS — F32A Depression, unspecified: Secondary | ICD-10-CM

## 2023-03-12 NOTE — Progress Notes (Signed)
Subjective:  Patient ID: Cynthia Garza, female    DOB: Jun 03, 1932  Age: 89 y.o. MRN: [id number]  CC: Medical Management of Chronic Issues ( f/u appt, no concerns )   HPI Cynthia Garza presents for HTN, depression, h/o CVA  Outpatient Medications Prior to Visit  Medication Sig Dispense Refill   amLODipine (NORVASC) 5 MG tablet TAKE 1 TABLET BY MOUTH EVERY DAY 90 tablet 1   BIOTIN PO Take by mouth.     buPROPion (WELLBUTRIN XL) 150 MG 24 hr tablet TAKE 1 TABLET BY MOUTH DAILY 90 tablet 1   Cholecalciferol (VITAMIN D3) 50 MCG (2000 UT) capsule Take 1 capsule (2,000 Units total) by mouth daily. 100 capsule 3   clopidogrel (PLAVIX) 75 MG tablet TAKE 1 TABLET BY MOUTH EVERY DAY 90 tablet 1   ezetimibe (ZETIA) 10 MG tablet TAKE 1 TABLET BY MOUTH DAILY 90 tablet 3   Famotidine (PEPCID PO) Take by mouth.     levofloxacin (LEVAQUIN) 500 MG tablet      losartan-hydrochlorothiazide (HYZAAR) 100-25 MG tablet TAKE 1 TABLET BY MOUTH EVERY DAY 90 tablet 3   meclizine (ANTIVERT) 12.5 MG tablet Take 1 tablet (12.5 mg total) by mouth 2 (two) times daily as needed for dizziness. 60 tablet 2   Multiple Vitamins-Minerals (ALIVE ONCE DAILY WOMENS 50+) TABS Take 1 tablet by mouth daily.     Multiple Vitamins-Minerals (PRESERVISION AREDS 2 PO) Take 1 tablet by mouth 2 (two) times daily.     Propylene Glycol 0.6 % SOLN Place 2 drops into both eyes at bedtime.     vitamin B-12 (CYANOCOBALAMIN) 1000 MCG tablet Take 1,000 mcg by mouth daily.     zolpidem (AMBIEN) 10 MG tablet TAKE 1/2-1 TABLET BY MOUTH AT BEDTIME AS NEEDED FOR SLEEP 90 tablet 1   No facility-administered medications prior to visit.    ROS: Review of Systems  Constitutional:  Negative for activity change, appetite change, chills, fatigue and unexpected weight change.  HENT:  Negative for congestion, mouth sores and sinus pressure.   Eyes:  Negative for visual disturbance.  Respiratory:  Negative for cough and chest tightness.    Gastrointestinal:  Negative for abdominal pain and nausea.  Genitourinary:  Negative for difficulty urinating, frequency and vaginal pain.  Musculoskeletal:  Negative for back pain and gait problem.  Skin:  Negative for pallor and rash.  Neurological:  Negative for dizziness, tremors, weakness, numbness and headaches.  Psychiatric/Behavioral:  Negative for confusion, sleep disturbance and suicidal ideas.     Objective:  BP 122/68   Pulse 72   Temp 97.9 F (36.6 C) (Oral)   Ht 5\' 1"  (1.549 m)   Wt 142 lb 6 oz (64.6 kg)   SpO2 97%   BMI 26.90 kg/m   BP Readings from Last 3 Encounters:  03/12/23 122/68  10/07/22 120/68  04/03/22 136/72    Wt Readings from Last 3 Encounters:  03/12/23 142 lb 6 oz (64.6 kg)  10/07/22 135 lb (61.2 kg)  04/03/22 135 lb 3.2 oz (61.3 kg)    Physical Exam Constitutional:      General: She is not in acute distress.    Appearance: She is well-developed.  HENT:     Head: Normocephalic.     Right Ear: External ear normal.     Left Ear: External ear normal.     Nose: Nose normal.  Eyes:     General:        Right eye: No discharge.  Subjective:  Patient ID: Cynthia Garza, female    DOB: Jun 03, 1932  Age: 89 y.o. MRN: [id number]  CC: Medical Management of Chronic Issues ( f/u appt, no concerns )   HPI Cynthia Garza presents for HTN, depression, h/o CVA  Outpatient Medications Prior to Visit  Medication Sig Dispense Refill   amLODipine (NORVASC) 5 MG tablet TAKE 1 TABLET BY MOUTH EVERY DAY 90 tablet 1   BIOTIN PO Take by mouth.     buPROPion (WELLBUTRIN XL) 150 MG 24 hr tablet TAKE 1 TABLET BY MOUTH DAILY 90 tablet 1   Cholecalciferol (VITAMIN D3) 50 MCG (2000 UT) capsule Take 1 capsule (2,000 Units total) by mouth daily. 100 capsule 3   clopidogrel (PLAVIX) 75 MG tablet TAKE 1 TABLET BY MOUTH EVERY DAY 90 tablet 1   ezetimibe (ZETIA) 10 MG tablet TAKE 1 TABLET BY MOUTH DAILY 90 tablet 3   Famotidine (PEPCID PO) Take by mouth.     levofloxacin (LEVAQUIN) 500 MG tablet      losartan-hydrochlorothiazide (HYZAAR) 100-25 MG tablet TAKE 1 TABLET BY MOUTH EVERY DAY 90 tablet 3   meclizine (ANTIVERT) 12.5 MG tablet Take 1 tablet (12.5 mg total) by mouth 2 (two) times daily as needed for dizziness. 60 tablet 2   Multiple Vitamins-Minerals (ALIVE ONCE DAILY WOMENS 50+) TABS Take 1 tablet by mouth daily.     Multiple Vitamins-Minerals (PRESERVISION AREDS 2 PO) Take 1 tablet by mouth 2 (two) times daily.     Propylene Glycol 0.6 % SOLN Place 2 drops into both eyes at bedtime.     vitamin B-12 (CYANOCOBALAMIN) 1000 MCG tablet Take 1,000 mcg by mouth daily.     zolpidem (AMBIEN) 10 MG tablet TAKE 1/2-1 TABLET BY MOUTH AT BEDTIME AS NEEDED FOR SLEEP 90 tablet 1   No facility-administered medications prior to visit.    ROS: Review of Systems  Constitutional:  Negative for activity change, appetite change, chills, fatigue and unexpected weight change.  HENT:  Negative for congestion, mouth sores and sinus pressure.   Eyes:  Negative for visual disturbance.  Respiratory:  Negative for cough and chest tightness.    Gastrointestinal:  Negative for abdominal pain and nausea.  Genitourinary:  Negative for difficulty urinating, frequency and vaginal pain.  Musculoskeletal:  Negative for back pain and gait problem.  Skin:  Negative for pallor and rash.  Neurological:  Negative for dizziness, tremors, weakness, numbness and headaches.  Psychiatric/Behavioral:  Negative for confusion, sleep disturbance and suicidal ideas.     Objective:  BP 122/68   Pulse 72   Temp 97.9 F (36.6 C) (Oral)   Ht 5\' 1"  (1.549 m)   Wt 142 lb 6 oz (64.6 kg)   SpO2 97%   BMI 26.90 kg/m   BP Readings from Last 3 Encounters:  03/12/23 122/68  10/07/22 120/68  04/03/22 136/72    Wt Readings from Last 3 Encounters:  03/12/23 142 lb 6 oz (64.6 kg)  10/07/22 135 lb (61.2 kg)  04/03/22 135 lb 3.2 oz (61.3 kg)    Physical Exam Constitutional:      General: She is not in acute distress.    Appearance: She is well-developed.  HENT:     Head: Normocephalic.     Right Ear: External ear normal.     Left Ear: External ear normal.     Nose: Nose normal.  Eyes:     General:        Right eye: No discharge.  Subjective:  Patient ID: Cynthia Garza, female    DOB: Jun 03, 1932  Age: 89 y.o. MRN: [id number]  CC: Medical Management of Chronic Issues ( f/u appt, no concerns )   HPI Cynthia Garza presents for HTN, depression, h/o CVA  Outpatient Medications Prior to Visit  Medication Sig Dispense Refill   amLODipine (NORVASC) 5 MG tablet TAKE 1 TABLET BY MOUTH EVERY DAY 90 tablet 1   BIOTIN PO Take by mouth.     buPROPion (WELLBUTRIN XL) 150 MG 24 hr tablet TAKE 1 TABLET BY MOUTH DAILY 90 tablet 1   Cholecalciferol (VITAMIN D3) 50 MCG (2000 UT) capsule Take 1 capsule (2,000 Units total) by mouth daily. 100 capsule 3   clopidogrel (PLAVIX) 75 MG tablet TAKE 1 TABLET BY MOUTH EVERY DAY 90 tablet 1   ezetimibe (ZETIA) 10 MG tablet TAKE 1 TABLET BY MOUTH DAILY 90 tablet 3   Famotidine (PEPCID PO) Take by mouth.     levofloxacin (LEVAQUIN) 500 MG tablet      losartan-hydrochlorothiazide (HYZAAR) 100-25 MG tablet TAKE 1 TABLET BY MOUTH EVERY DAY 90 tablet 3   meclizine (ANTIVERT) 12.5 MG tablet Take 1 tablet (12.5 mg total) by mouth 2 (two) times daily as needed for dizziness. 60 tablet 2   Multiple Vitamins-Minerals (ALIVE ONCE DAILY WOMENS 50+) TABS Take 1 tablet by mouth daily.     Multiple Vitamins-Minerals (PRESERVISION AREDS 2 PO) Take 1 tablet by mouth 2 (two) times daily.     Propylene Glycol 0.6 % SOLN Place 2 drops into both eyes at bedtime.     vitamin B-12 (CYANOCOBALAMIN) 1000 MCG tablet Take 1,000 mcg by mouth daily.     zolpidem (AMBIEN) 10 MG tablet TAKE 1/2-1 TABLET BY MOUTH AT BEDTIME AS NEEDED FOR SLEEP 90 tablet 1   No facility-administered medications prior to visit.    ROS: Review of Systems  Constitutional:  Negative for activity change, appetite change, chills, fatigue and unexpected weight change.  HENT:  Negative for congestion, mouth sores and sinus pressure.   Eyes:  Negative for visual disturbance.  Respiratory:  Negative for cough and chest tightness.    Gastrointestinal:  Negative for abdominal pain and nausea.  Genitourinary:  Negative for difficulty urinating, frequency and vaginal pain.  Musculoskeletal:  Negative for back pain and gait problem.  Skin:  Negative for pallor and rash.  Neurological:  Negative for dizziness, tremors, weakness, numbness and headaches.  Psychiatric/Behavioral:  Negative for confusion, sleep disturbance and suicidal ideas.     Objective:  BP 122/68   Pulse 72   Temp 97.9 F (36.6 C) (Oral)   Ht 5\' 1"  (1.549 m)   Wt 142 lb 6 oz (64.6 kg)   SpO2 97%   BMI 26.90 kg/m   BP Readings from Last 3 Encounters:  03/12/23 122/68  10/07/22 120/68  04/03/22 136/72    Wt Readings from Last 3 Encounters:  03/12/23 142 lb 6 oz (64.6 kg)  10/07/22 135 lb (61.2 kg)  04/03/22 135 lb 3.2 oz (61.3 kg)    Physical Exam Constitutional:      General: She is not in acute distress.    Appearance: She is well-developed.  HENT:     Head: Normocephalic.     Right Ear: External ear normal.     Left Ear: External ear normal.     Nose: Nose normal.  Eyes:     General:        Right eye: No discharge.  ECA       94      6                                    +----------+--------+--------+--------+------------+--------+ +----------+--------+-------+--------+-------------------+           PSV cm/sEDV cmsDescribeArm Pressure (mmHG) +----------+--------+-------+--------+-------------------+ [id number]                                        +----------+--------+-------+--------+-------------------+ +---------+--------+--+--------+--+---------+ VertebralPSV cm/s37EDV cm/s10Antegrade +---------+--------+--+--------+--+---------+  Left Carotid Findings: +----------+--------+--------+--------+------------+--------+           PSV cm/sEDV cm/sStenosisDescribe    Comments +----------+--------+--------+--------+------------+--------+ CCA Prox  132     14              heterogenous         +----------+--------+--------+--------+------------+--------+ CCA Distal100     17              heterogenous         +----------+--------+--------+--------+------------+--------+ ICA Prox  107     25      1-39%   heterogenous         +----------+--------+--------+--------+------------+--------+ ICA Distal82      13                                   +----------+--------+--------+--------+------------+--------+ ECA       80                                           +----------+--------+--------+--------+------------+--------+ +----------+--------+--------+--------+-------------------+ SubclavianPSV cm/sEDV cm/sDescribeArm Pressure (mmHG) +----------+--------+--------+--------+-------------------+           138                                          +----------+--------+--------+--------+-------------------+ +---------+--------+--+--------+--+---------+ VertebralPSV cm/s67EDV cm/s17Antegrade +---------+--------+--+--------+--+---------+  Summary: Right Carotid: Velocities in the right ICA are consistent with a 1-39% stenosis. Left Carotid: Velocities in the left ICA are consistent with a 1-39% stenosis. Vertebrals: Bilateral vertebral arteries demonstrate antegrade flow. *See table(s) above for measurements and observations.  Electronically signed by Delia Heady MD on 10/25/2018 at 12:23:35 PM.    Final    ECHOCARDIOGRAM COMPLETE  Result Date: 10/24/2018   ECHOCARDIOGRAM REPORT   Patient Name:   Cynthia Garza Date of Exam: 10/24/2018 Medical Rec #:  [id number]           Height:       61.0 in Accession #:    [id number]          Weight:       135.0 lb Date of Birth:  May 15, 1933            BSA:          1.60 m Patient Age:    85 years            BP:           118/57 mmHg Patient Gender: F                   HR:  ECA       94      6                                    +----------+--------+--------+--------+------------+--------+ +----------+--------+-------+--------+-------------------+           PSV cm/sEDV cmsDescribeArm Pressure (mmHG) +----------+--------+-------+--------+-------------------+ [id number]                                        +----------+--------+-------+--------+-------------------+ +---------+--------+--+--------+--+---------+ VertebralPSV cm/s37EDV cm/s10Antegrade +---------+--------+--+--------+--+---------+  Left Carotid Findings: +----------+--------+--------+--------+------------+--------+           PSV cm/sEDV cm/sStenosisDescribe    Comments +----------+--------+--------+--------+------------+--------+ CCA Prox  132     14              heterogenous         +----------+--------+--------+--------+------------+--------+ CCA Distal100     17              heterogenous         +----------+--------+--------+--------+------------+--------+ ICA Prox  107     25      1-39%   heterogenous         +----------+--------+--------+--------+------------+--------+ ICA Distal82      13                                   +----------+--------+--------+--------+------------+--------+ ECA       80                                           +----------+--------+--------+--------+------------+--------+ +----------+--------+--------+--------+-------------------+ SubclavianPSV cm/sEDV cm/sDescribeArm Pressure (mmHG) +----------+--------+--------+--------+-------------------+           138                                          +----------+--------+--------+--------+-------------------+ +---------+--------+--+--------+--+---------+ VertebralPSV cm/s67EDV cm/s17Antegrade +---------+--------+--+--------+--+---------+  Summary: Right Carotid: Velocities in the right ICA are consistent with a 1-39% stenosis. Left Carotid: Velocities in the left ICA are consistent with a 1-39% stenosis. Vertebrals: Bilateral vertebral arteries demonstrate antegrade flow. *See table(s) above for measurements and observations.  Electronically signed by Delia Heady MD on 10/25/2018 at 12:23:35 PM.    Final    ECHOCARDIOGRAM COMPLETE  Result Date: 10/24/2018   ECHOCARDIOGRAM REPORT   Patient Name:   Cynthia Garza Date of Exam: 10/24/2018 Medical Rec #:  [id number]           Height:       61.0 in Accession #:    [id number]          Weight:       135.0 lb Date of Birth:  May 15, 1933            BSA:          1.60 m Patient Age:    85 years            BP:           118/57 mmHg Patient Gender: F                   HR:  ECA       94      6                                    +----------+--------+--------+--------+------------+--------+ +----------+--------+-------+--------+-------------------+           PSV cm/sEDV cmsDescribeArm Pressure (mmHG) +----------+--------+-------+--------+-------------------+ [id number]                                        +----------+--------+-------+--------+-------------------+ +---------+--------+--+--------+--+---------+ VertebralPSV cm/s37EDV cm/s10Antegrade +---------+--------+--+--------+--+---------+  Left Carotid Findings: +----------+--------+--------+--------+------------+--------+           PSV cm/sEDV cm/sStenosisDescribe    Comments +----------+--------+--------+--------+------------+--------+ CCA Prox  132     14              heterogenous         +----------+--------+--------+--------+------------+--------+ CCA Distal100     17              heterogenous         +----------+--------+--------+--------+------------+--------+ ICA Prox  107     25      1-39%   heterogenous         +----------+--------+--------+--------+------------+--------+ ICA Distal82      13                                   +----------+--------+--------+--------+------------+--------+ ECA       80                                           +----------+--------+--------+--------+------------+--------+ +----------+--------+--------+--------+-------------------+ SubclavianPSV cm/sEDV cm/sDescribeArm Pressure (mmHG) +----------+--------+--------+--------+-------------------+           138                                          +----------+--------+--------+--------+-------------------+ +---------+--------+--+--------+--+---------+ VertebralPSV cm/s67EDV cm/s17Antegrade +---------+--------+--+--------+--+---------+  Summary: Right Carotid: Velocities in the right ICA are consistent with a 1-39% stenosis. Left Carotid: Velocities in the left ICA are consistent with a 1-39% stenosis. Vertebrals: Bilateral vertebral arteries demonstrate antegrade flow. *See table(s) above for measurements and observations.  Electronically signed by Delia Heady MD on 10/25/2018 at 12:23:35 PM.    Final    ECHOCARDIOGRAM COMPLETE  Result Date: 10/24/2018   ECHOCARDIOGRAM REPORT   Patient Name:   Cynthia Garza Date of Exam: 10/24/2018 Medical Rec #:  [id number]           Height:       61.0 in Accession #:    [id number]          Weight:       135.0 lb Date of Birth:  May 15, 1933            BSA:          1.60 m Patient Age:    85 years            BP:           118/57 mmHg Patient Gender: F                   HR:  ECA       94      6                                    +----------+--------+--------+--------+------------+--------+ +----------+--------+-------+--------+-------------------+           PSV cm/sEDV cmsDescribeArm Pressure (mmHG) +----------+--------+-------+--------+-------------------+ [id number]                                        +----------+--------+-------+--------+-------------------+ +---------+--------+--+--------+--+---------+ VertebralPSV cm/s37EDV cm/s10Antegrade +---------+--------+--+--------+--+---------+  Left Carotid Findings: +----------+--------+--------+--------+------------+--------+           PSV cm/sEDV cm/sStenosisDescribe    Comments +----------+--------+--------+--------+------------+--------+ CCA Prox  132     14              heterogenous         +----------+--------+--------+--------+------------+--------+ CCA Distal100     17              heterogenous         +----------+--------+--------+--------+------------+--------+ ICA Prox  107     25      1-39%   heterogenous         +----------+--------+--------+--------+------------+--------+ ICA Distal82      13                                   +----------+--------+--------+--------+------------+--------+ ECA       80                                           +----------+--------+--------+--------+------------+--------+ +----------+--------+--------+--------+-------------------+ SubclavianPSV cm/sEDV cm/sDescribeArm Pressure (mmHG) +----------+--------+--------+--------+-------------------+           138                                          +----------+--------+--------+--------+-------------------+ +---------+--------+--+--------+--+---------+ VertebralPSV cm/s67EDV cm/s17Antegrade +---------+--------+--+--------+--+---------+  Summary: Right Carotid: Velocities in the right ICA are consistent with a 1-39% stenosis. Left Carotid: Velocities in the left ICA are consistent with a 1-39% stenosis. Vertebrals: Bilateral vertebral arteries demonstrate antegrade flow. *See table(s) above for measurements and observations.  Electronically signed by Delia Heady MD on 10/25/2018 at 12:23:35 PM.    Final    ECHOCARDIOGRAM COMPLETE  Result Date: 10/24/2018   ECHOCARDIOGRAM REPORT   Patient Name:   Cynthia Garza Date of Exam: 10/24/2018 Medical Rec #:  [id number]           Height:       61.0 in Accession #:    [id number]          Weight:       135.0 lb Date of Birth:  May 15, 1933            BSA:          1.60 m Patient Age:    85 years            BP:           118/57 mmHg Patient Gender: F                   HR:  Subjective:  Patient ID: Cynthia Garza, female    DOB: Jun 03, 1932  Age: 89 y.o. MRN: [id number]  CC: Medical Management of Chronic Issues ( f/u appt, no concerns )   HPI Cynthia Garza presents for HTN, depression, h/o CVA  Outpatient Medications Prior to Visit  Medication Sig Dispense Refill   amLODipine (NORVASC) 5 MG tablet TAKE 1 TABLET BY MOUTH EVERY DAY 90 tablet 1   BIOTIN PO Take by mouth.     buPROPion (WELLBUTRIN XL) 150 MG 24 hr tablet TAKE 1 TABLET BY MOUTH DAILY 90 tablet 1   Cholecalciferol (VITAMIN D3) 50 MCG (2000 UT) capsule Take 1 capsule (2,000 Units total) by mouth daily. 100 capsule 3   clopidogrel (PLAVIX) 75 MG tablet TAKE 1 TABLET BY MOUTH EVERY DAY 90 tablet 1   ezetimibe (ZETIA) 10 MG tablet TAKE 1 TABLET BY MOUTH DAILY 90 tablet 3   Famotidine (PEPCID PO) Take by mouth.     levofloxacin (LEVAQUIN) 500 MG tablet      losartan-hydrochlorothiazide (HYZAAR) 100-25 MG tablet TAKE 1 TABLET BY MOUTH EVERY DAY 90 tablet 3   meclizine (ANTIVERT) 12.5 MG tablet Take 1 tablet (12.5 mg total) by mouth 2 (two) times daily as needed for dizziness. 60 tablet 2   Multiple Vitamins-Minerals (ALIVE ONCE DAILY WOMENS 50+) TABS Take 1 tablet by mouth daily.     Multiple Vitamins-Minerals (PRESERVISION AREDS 2 PO) Take 1 tablet by mouth 2 (two) times daily.     Propylene Glycol 0.6 % SOLN Place 2 drops into both eyes at bedtime.     vitamin B-12 (CYANOCOBALAMIN) 1000 MCG tablet Take 1,000 mcg by mouth daily.     zolpidem (AMBIEN) 10 MG tablet TAKE 1/2-1 TABLET BY MOUTH AT BEDTIME AS NEEDED FOR SLEEP 90 tablet 1   No facility-administered medications prior to visit.    ROS: Review of Systems  Constitutional:  Negative for activity change, appetite change, chills, fatigue and unexpected weight change.  HENT:  Negative for congestion, mouth sores and sinus pressure.   Eyes:  Negative for visual disturbance.  Respiratory:  Negative for cough and chest tightness.    Gastrointestinal:  Negative for abdominal pain and nausea.  Genitourinary:  Negative for difficulty urinating, frequency and vaginal pain.  Musculoskeletal:  Negative for back pain and gait problem.  Skin:  Negative for pallor and rash.  Neurological:  Negative for dizziness, tremors, weakness, numbness and headaches.  Psychiatric/Behavioral:  Negative for confusion, sleep disturbance and suicidal ideas.     Objective:  BP 122/68   Pulse 72   Temp 97.9 F (36.6 C) (Oral)   Ht 5\' 1"  (1.549 m)   Wt 142 lb 6 oz (64.6 kg)   SpO2 97%   BMI 26.90 kg/m   BP Readings from Last 3 Encounters:  03/12/23 122/68  10/07/22 120/68  04/03/22 136/72    Wt Readings from Last 3 Encounters:  03/12/23 142 lb 6 oz (64.6 kg)  10/07/22 135 lb (61.2 kg)  04/03/22 135 lb 3.2 oz (61.3 kg)    Physical Exam Constitutional:      General: She is not in acute distress.    Appearance: She is well-developed.  HENT:     Head: Normocephalic.     Right Ear: External ear normal.     Left Ear: External ear normal.     Nose: Nose normal.  Eyes:     General:        Right eye: No discharge.

## 2023-03-12 NOTE — Assessment & Plan Note (Signed)
Statin intolerant Off Pravastatin Cont on Zetia

## 2023-03-12 NOTE — Assessment & Plan Note (Signed)
Cont on Plavix, Zetia, Benicar HCT, Amlodipine

## 2023-03-12 NOTE — Assessment & Plan Note (Signed)
Cont on Wellbutrin XL

## 2023-03-12 NOTE — Assessment & Plan Note (Signed)
Cont on  Amlodipine, Losartan HCT

## 2023-03-12 NOTE — Assessment & Plan Note (Addendum)
Cont on Plavix, Zetia, Benicar HCT, Amlodipine No focal weakness

## 2023-04-08 DIAGNOSIS — Z961 Presence of intraocular lens: Secondary | ICD-10-CM | POA: Diagnosis not present

## 2023-04-08 DIAGNOSIS — H353131 Nonexudative age-related macular degeneration, bilateral, early dry stage: Secondary | ICD-10-CM | POA: Diagnosis not present

## 2023-04-08 DIAGNOSIS — H524 Presbyopia: Secondary | ICD-10-CM | POA: Diagnosis not present

## 2023-06-01 ENCOUNTER — Other Ambulatory Visit: Payer: Self-pay | Admitting: Internal Medicine

## 2023-06-18 ENCOUNTER — Telehealth: Payer: Self-pay | Admitting: Internal Medicine

## 2023-06-18 NOTE — Telephone Encounter (Signed)
Copied from CRM [id number]. Topic: Clinical - Request for Lab/Test Order >> Jun 18, 2023 10:05 AM Maxwell Marion wrote: Reason for CRM: Cynthia Garza with [facility] called because patient has an appointment on Tuesday afternoon for a diagnostic mammogram and they have not received order yet. They need it before the patient's appointment. Fax number is [phone]. Call back number is [phone].

## 2023-06-19 NOTE — Telephone Encounter (Signed)
Copied from CRM [id number]. Topic: Referral - Question >> Jun 19, 2023  3:49 PM Deaijah H wrote: Reason for CRM: *no question* Patient called in stating they will not do mammogram until Dr. Posey Rea responds to the reference/referral / if needed, please call [phone]

## 2023-06-19 NOTE — Telephone Encounter (Signed)
Orders have been faxed back twice via the [phone]...  I have resent the fax today with a receipt confirmation the fax has been faxed over with success.

## 2023-06-23 DIAGNOSIS — R922 Inconclusive mammogram: Secondary | ICD-10-CM | POA: Diagnosis not present

## 2023-06-23 DIAGNOSIS — R921 Mammographic calcification found on diagnostic imaging of breast: Secondary | ICD-10-CM | POA: Diagnosis not present

## 2023-06-23 LAB — HM MAMMOGRAPHY

## 2023-06-24 ENCOUNTER — Encounter: Payer: Self-pay | Admitting: Internal Medicine

## 2023-08-04 ENCOUNTER — Encounter: Payer: Self-pay | Admitting: Internal Medicine

## 2023-08-04 DIAGNOSIS — I69359 Hemiplegia and hemiparesis following cerebral infarction affecting unspecified side: Secondary | ICD-10-CM

## 2023-08-04 DIAGNOSIS — E785 Hyperlipidemia, unspecified: Secondary | ICD-10-CM

## 2023-08-04 DIAGNOSIS — I10 Essential (primary) hypertension: Secondary | ICD-10-CM

## 2023-08-18 NOTE — Telephone Encounter (Signed)
 Copied from CRM [id number]. Topic: Clinical - Request for Lab/Test Order >> Aug 18, 2023 11:32 AM Cynthia Garza wrote: Reason for CRM: Patient is calling to have lab orders submitted and to have provider specify on the order to draw labs from patients foot per notes in system, please reach out to patient to schedule her or let her know when she's able to schedule. Patient has an appointment with provider on 4/03, thanks.   [name]  [phone]

## 2023-08-25 ENCOUNTER — Telehealth: Payer: Self-pay | Admitting: Internal Medicine

## 2023-08-25 ENCOUNTER — Other Ambulatory Visit

## 2023-08-25 DIAGNOSIS — I69359 Hemiplegia and hemiparesis following cerebral infarction affecting unspecified side: Secondary | ICD-10-CM

## 2023-08-25 DIAGNOSIS — I10 Essential (primary) hypertension: Secondary | ICD-10-CM

## 2023-08-25 DIAGNOSIS — E785 Hyperlipidemia, unspecified: Secondary | ICD-10-CM

## 2023-08-25 NOTE — Telephone Encounter (Signed)
 E2C2 please rescheduled pt if they call back. Dr. Macario Golds will be out of the office on 08/27/2023. Thanks

## 2023-08-27 ENCOUNTER — Ambulatory Visit: Admitting: Internal Medicine

## 2023-09-03 ENCOUNTER — Other Ambulatory Visit: Payer: Self-pay | Admitting: Internal Medicine

## 2023-09-03 MED ORDER — CLOPIDOGREL BISULFATE 75 MG PO TABS: 75.0000 mg | ORAL_TABLET | Freq: Every day | ORAL | 1 refills | Status: DC

## 2023-09-03 NOTE — Telephone Encounter (Signed)
 Copied from CRM [id number]. Topic: Clinical - Medication Refill >> Sep 03, 2023 10:34 AM Fredrich Romans wrote: Most Recent Primary Care Visit:  Provider: Tresa Garter  Department: [facility]  Visit Type: OFFICE VISIT  Date: 03/12/2023  Medication: clopidogrel (PLAVIX) 75 MG tablet  Has the patient contacted their pharmacy? Yes (Agent: If no, request that the patient contact the pharmacy for the refill. If patient does not wish to contact the pharmacy document the reason why and proceed with request.) (Agent: If yes, when and what did the pharmacy advise?)  Is this the correct pharmacy for this prescription? Yes If no, delete pharmacy and type the correct one.  This is the patient's preferred pharmacy:  [facility] [address] [place] - [address] [place] [address] Phone: [phone] Fax: [phone]     Has the prescription been filled recently? No  Is the patient out of the medication? Yes  Has the patient been seen for an appointment in the last year OR does the patient have an upcoming appointment? Yes  Can we respond through MyChart? Yes  Agent: Please be advised that Rx refills may take up to 3 business days. We ask that you follow-up with your pharmacy.

## 2023-09-18 ENCOUNTER — Other Ambulatory Visit: Payer: Self-pay | Admitting: Internal Medicine

## 2023-09-18 MED ORDER — AMLODIPINE BESYLATE 5 MG PO TABS: 5.0000 mg | ORAL_TABLET | Freq: Every day | ORAL | 1 refills | Status: DC

## 2023-09-18 NOTE — Telephone Encounter (Signed)
 Copied from CRM [id number]. Topic: Clinical - Medication Refill >> Sep 18, 2023  3:04 PM Jethro Morrison wrote: Most Recent Primary Care Visit:  Provider: Genia Kettering  Department: [facility]  Visit Type: OFFICE VISIT  Date: 03/12/2023  Medication: amLODipine  (NORVASC ) 5 MG tablet  Has the patient contacted their pharmacy? Yes (Agent: If no, request that the patient contact the pharmacy for the refill. If patient does not wish to contact the pharmacy document the reason why and proceed with request.) (Agent: If yes, when and what did the pharmacy advise?)  Is this the correct pharmacy for this prescription? Yes If no, delete pharmacy and type the correct one.  This is the patient's preferred pharmacy:  [facility] [address] [place] - [address] [place] [address] Phone: [phone] Fax: [phone]  [facility] - [address], [place] - [address] [place] [address] Phone: [phone] Fax: [phone]   Has the prescription been filled recently? Yes  Is the patient out of the medication? No  Has the patient been seen for an appointment in the last year OR does the patient have an upcoming appointment? Yes  Can we respond through MyChart? No  Agent: Please be advised that Rx refills may take up to 3 business days. We ask that you follow-up with your pharmacy.

## 2023-09-21 ENCOUNTER — Other Ambulatory Visit: Payer: Self-pay | Admitting: Internal Medicine

## 2023-09-23 ENCOUNTER — Ambulatory Visit: Admitting: Internal Medicine

## 2023-09-23 ENCOUNTER — Encounter: Payer: Self-pay | Admitting: Internal Medicine

## 2023-09-23 VITALS — BP 132/68 | HR 71 | Temp 97.6°F | Ht 61.0 in | Wt 139.2 lb

## 2023-09-23 DIAGNOSIS — Z170 Estrogen receptor positive status [ER+]: Secondary | ICD-10-CM | POA: Diagnosis not present

## 2023-09-23 DIAGNOSIS — C50411 Malignant neoplasm of upper-outer quadrant of right female breast: Secondary | ICD-10-CM | POA: Diagnosis not present

## 2023-09-23 DIAGNOSIS — I10 Essential (primary) hypertension: Secondary | ICD-10-CM

## 2023-09-23 DIAGNOSIS — F5101 Primary insomnia: Secondary | ICD-10-CM | POA: Diagnosis not present

## 2023-09-23 DIAGNOSIS — F32A Depression, unspecified: Secondary | ICD-10-CM

## 2023-09-23 MED ORDER — CLOPIDOGREL BISULFATE 75 MG PO TABS: 75.0000 mg | ORAL_TABLET | Freq: Every day | ORAL | 3 refills | Status: DC

## 2023-09-23 MED ORDER — LOSARTAN POTASSIUM-HCTZ 100-25 MG PO TABS: 1.0000 | ORAL_TABLET | Freq: Every day | ORAL | 3 refills | Status: AC

## 2023-09-23 NOTE — Assessment & Plan Note (Signed)
Cont on Wellbutrin XL

## 2023-09-23 NOTE — Assessment & Plan Note (Signed)
 Better

## 2023-09-23 NOTE — Progress Notes (Signed)
 Subjective:  Patient ID: Cynthia Garza, female    DOB: 1932/11/26  Age: 89 y.o. MRN: [id number]  CC: Medical Management of Chronic Issues (6 Month Follow Up. Would like to have labs done but has limitations as to only having labs drawn in the foot. Would like recommendation to somewhere that does draw from the foot)   HPI  Cynthia Garza presents for HTN, h/o CVA, insomnia She is s/p B lumpectomy and LN bx  in 2026 and does not want to use her arms for it.  Outpatient Medications Prior to Visit  Medication Sig Dispense Refill   amLODipine  (NORVASC ) 5 MG tablet Take 1 tablet (5 mg total) by mouth daily. 90 tablet 1   BIOTIN PO Take by mouth.     buPROPion  (WELLBUTRIN  XL) 150 MG 24 hr tablet TAKE 1 TABLET BY MOUTH DAILY 90 tablet 1   Cholecalciferol (VITAMIN D3) 50 MCG (2000 UT) capsule Take 1 capsule (2,000 Units total) by mouth daily. 100 capsule 3   ezetimibe  (ZETIA ) 10 MG tablet TAKE 1 TABLET BY MOUTH DAILY 90 tablet 3   Famotidine  (PEPCID  PO) Take by mouth.     levofloxacin  (LEVAQUIN ) 500 MG tablet      meclizine  (ANTIVERT ) 12.5 MG tablet Take 1 tablet (12.5 mg total) by mouth 2 (two) times daily as needed for dizziness. 60 tablet 2   Multiple Vitamins-Minerals (ALIVE ONCE DAILY WOMENS 50+) TABS Take 1 tablet by mouth daily.     Multiple Vitamins-Minerals (PRESERVISION AREDS 2 PO) Take 1 tablet by mouth 2 (two) times daily.     Propylene Glycol 0.6 % SOLN Place 2 drops into both eyes at bedtime.     vitamin B-12 (CYANOCOBALAMIN ) 1000 MCG tablet Take 1,000 mcg by mouth daily.     zolpidem  (AMBIEN ) 10 MG tablet TAKE 1/2-1 TABLET BY MOUTH AT BEDTIME AS NEEDED FOR SLEEP 90 tablet 1   clopidogrel  (PLAVIX ) 75 MG tablet Take 1 tablet (75 mg total) by mouth daily. 90 tablet 1   losartan -hydrochlorothiazide  (HYZAAR) 100-25 MG tablet TAKE 1 TABLET BY MOUTH EVERY DAY 90 tablet 3   No facility-administered medications prior to visit.    ROS: Review of Systems  Constitutional:   Negative for activity change, appetite change, chills, fatigue and unexpected weight change.  HENT:  Negative for congestion, mouth sores and sinus pressure.   Eyes:  Negative for visual disturbance.  Respiratory:  Negative for cough and chest tightness.   Gastrointestinal:  Negative for abdominal pain and nausea.  Genitourinary:  Negative for difficulty urinating, frequency and vaginal pain.  Musculoskeletal:  Negative for back pain and gait problem.  Skin:  Negative for pallor and rash.  Neurological:  Negative for dizziness, tremors, weakness, numbness and headaches.  Psychiatric/Behavioral:  Negative for confusion, sleep disturbance and suicidal ideas.     Objective:  BP 132/68   Pulse 71   Temp 97.6 F (36.4 C)   Ht 5\' 1"  (1.549 m)   Wt 139 lb 3.2 oz (63.1 kg)   SpO2 97%   BMI 26.30 kg/m   BP Readings from Last 3 Encounters:  09/23/23 132/68  03/12/23 122/68  10/07/22 120/68    Wt Readings from Last 3 Encounters:  09/23/23 139 lb 3.2 oz (63.1 kg)  03/12/23 142 lb 6 oz (64.6 kg)  10/07/22 135 lb (61.2 kg)    Physical Exam Constitutional:      General: She is not in acute distress.    Appearance: Normal appearance. She  is well-developed.  HENT:     Head: Normocephalic.     Right Ear: External ear normal.     Left Ear: External ear normal.     Nose: Nose normal.  Eyes:     General:        Right eye: No discharge.        Left eye: No discharge.     Conjunctiva/sclera: Conjunctivae normal.     Pupils: Pupils are equal, round, and reactive to light.  Neck:     Thyroid : No thyromegaly.     Vascular: No JVD.     Trachea: No tracheal deviation.  Cardiovascular:     Rate and Rhythm: Normal rate and regular rhythm.     Heart sounds: Normal heart sounds.  Pulmonary:     Effort: No respiratory distress.     Breath sounds: No stridor. No wheezing.  Abdominal:     General: Bowel sounds are normal. There is no distension.     Palpations: Abdomen is soft. There is no  mass.     Tenderness: There is no abdominal tenderness. There is no guarding or rebound.  Musculoskeletal:        General: No tenderness.     Cervical back: Normal range of motion and neck supple. No rigidity.  Lymphadenopathy:     Cervical: No cervical adenopathy.  Skin:    Findings: No erythema or rash.  Neurological:     Cranial Nerves: No cranial nerve deficit.     Motor: No abnormal muscle tone.     Coordination: Coordination normal.     Deep Tendon Reflexes: Reflexes normal.  Psychiatric:        Behavior: Behavior normal.        Thought Content: Thought content normal.        Judgment: Judgment normal.   Both arms/hands appear normal.  No edema.  Good circulation  Lab Results  Component Value Date   WBC 9.6 04/03/2022   HGB 13.2 04/03/2022   HCT 39.4 04/03/2022   PLT 222.0 04/03/2022   GLUCOSE 92 10/07/2022   CHOL 192 04/03/2022   TRIG 204.0 (H) 04/03/2022   HDL 61.10 04/03/2022   LDLDIRECT 114.0 04/03/2022   LDLCALC 100 (H) 05/15/2021   ALT 10 10/07/2022   AST 16 10/07/2022   NA 141 10/07/2022   K 3.7 10/07/2022   CL 103 10/07/2022   CREATININE 0.90 10/07/2022   BUN 20 10/07/2022   CO2 28 10/07/2022   TSH 1.33 04/03/2022   INR 1.0 10/24/2018   HGBA1C 5.6 10/24/2018    VAS US  CAROTID (at [facility] and [facility] only) Result Date: 10/25/2018 Carotid Arterial Duplex Study Indications:  CVA. Risk Factors: Hypertension. Performing Technologist: Enis Harsh RVS  Examination Guidelines: A complete evaluation includes B-mode imaging, spectral Doppler, color Doppler, and power Doppler as needed of all accessible portions of each vessel. Bilateral testing is considered an integral part of a complete examination. Limited examinations for reoccurring indications may be performed as noted.  Right Carotid Findings: +----------+--------+--------+--------+------------+--------+           PSV cm/sEDV cm/sStenosisDescribe    Comments  +----------+--------+--------+--------+------------+--------+ CCA Prox  110     19              heterogenous         +----------+--------+--------+--------+------------+--------+ CCA Distal103     19              heterogenous         +----------+--------+--------+--------+------------+--------+  ICA Prox  73      17      1-39%   heterogenous         +----------+--------+--------+--------+------------+--------+ ICA Distal106     24                                   +----------+--------+--------+--------+------------+--------+ ECA       94      6                                    +----------+--------+--------+--------+------------+--------+ +----------+--------+-------+--------+-------------------+           PSV cm/sEDV cmsDescribeArm Pressure (mmHG) +----------+--------+-------+--------+-------------------+ [id number]                                        +----------+--------+-------+--------+-------------------+ +---------+--------+--+--------+--+---------+ VertebralPSV cm/s37EDV cm/s10Antegrade +---------+--------+--+--------+--+---------+  Left Carotid Findings: +----------+--------+--------+--------+------------+--------+           PSV cm/sEDV cm/sStenosisDescribe    Comments +----------+--------+--------+--------+------------+--------+ CCA Prox  132     14              heterogenous         +----------+--------+--------+--------+------------+--------+ CCA Distal100     17              heterogenous         +----------+--------+--------+--------+------------+--------+ ICA Prox  107     25      1-39%   heterogenous         +----------+--------+--------+--------+------------+--------+ ICA Distal82      13                                   +----------+--------+--------+--------+------------+--------+ ECA       80                                           +----------+--------+--------+--------+------------+--------+  +----------+--------+--------+--------+-------------------+ SubclavianPSV cm/sEDV cm/sDescribeArm Pressure (mmHG) +----------+--------+--------+--------+-------------------+           138                                         +----------+--------+--------+--------+-------------------+ +---------+--------+--+--------+--+---------+ VertebralPSV cm/s67EDV cm/s17Antegrade +---------+--------+--+--------+--+---------+  Summary: Right Carotid: Velocities in the right ICA are consistent with a 1-39% stenosis. Left Carotid: Velocities in the left ICA are consistent with a 1-39% stenosis. Vertebrals: Bilateral vertebral arteries demonstrate antegrade flow. *See table(s) above for measurements and observations.  Electronically signed by Ardella Beaver MD on 10/25/2018 at 12:23:35 PM.    Final    ECHOCARDIOGRAM COMPLETE Result Date: 10/24/2018   ECHOCARDIOGRAM REPORT   Patient Name:   Cynthia Garza Date of Exam: 10/24/2018 Medical Rec #:  [id number]           Height:       61.0 in Accession #:    [id number]          Weight:       135.0 lb Date of Birth:  July 03, 1932  BSA:          1.60 m Patient Age:    85 years            BP:           118/57 mmHg Patient Gender: F                   HR:           75 bpm. Exam Location:  Inpatient  Procedure: 2D Echo Indications:    Stroke 434.91  History:        Patient has no prior history of Echocardiogram examinations.                 Signs/Symptoms: Murmur Risk Factors: Hypertension and                 Dyslipidemia.  Sonographer:    Joleen Navy RDCS (AE) Referring Phys: [phone] JARED M GARDNER IMPRESSIONS  1. The left ventricle has hyperdynamic systolic function, with an ejection fraction of >65%. The cavity size was normal. Left ventricular diastolic Doppler parameters are consistent with impaired relaxation.  2. The right ventricle has normal systolic function. The cavity was normal. There is no increase in right ventricular wall thickness.  3. Left  atrial size was mildly dilated.  4. The mitral valve is abnormal. Mild thickening of the mitral valve leaflet. There is moderate mitral annular calcification present.  5. The aortic valve is tricuspid. Mild thickening of the aortic valve. Moderate calcification of the aortic valve.  6. The interatrial septum was not assessed. FINDINGS  Left Ventricle: The left ventricle has hyperdynamic systolic function, with an ejection fraction of >65%. The cavity size was normal. The left ventricular wall thickness was not assessed. Left ventricular diastolic Doppler parameters are consistent with  impaired relaxation. Right Ventricle: The right ventricle has normal systolic function. The cavity was normal. There is no increase in right ventricular wall thickness. Left Atrium: Left atrial size was mildly dilated. Right Atrium: Right atrial size was normal in size. Right atrial pressure is estimated at 10 mmHg. Interatrial Septum: The interatrial septum was not assessed. Pericardium: There is no evidence of pericardial effusion. Mitral Valve: The mitral valve is abnormal. Mild thickening of the mitral valve leaflet. There is moderate mitral annular calcification present. Mitral valve regurgitation is trivial by color flow Doppler. Tricuspid Valve: The tricuspid valve is normal in structure. Tricuspid valve regurgitation is trivial by color flow Doppler. Aortic Valve: The aortic valve is tricuspid Mild thickening of the aortic valve. Moderate calcification of the aortic valve. Aortic valve regurgitation was not visualized by color flow Doppler. Pulmonic Valve: The pulmonic valve was normal in structure. Pulmonic valve regurgitation is not visualized by color flow Doppler. Venous: The inferior vena cava is normal in size with greater than 50% respiratory variability.  +--------------+--------++ LEFT VENTRICLE         +----------------+---------++ +--------------+--------++ Diastology                PLAX 2D                 +----------------+---------++ +--------------+--------++ LV e' lateral:  5.98 cm/s LVIDd:        3.60 cm  +----------------+---------++ +--------------+--------++ LV E/e' lateral:18.6      LVIDs:        2.40 cm  +----------------+---------++ +--------------+--------++ LV e' medial:   4.90 cm/s LV PW:        1.00 cm  +----------------+---------++ +--------------+--------++ LV E/e'  medial: 22.7      LV IVS:       1.10 cm  +----------------+---------++ +--------------+--------++ LVOT diam:    2.00 cm  +--------------+--------++ LV SV:        34 ml    +--------------+--------++ LV SV Index:  20.93    +--------------+--------++ LVOT Area:    3.14 cm +--------------+--------++                        +--------------+--------++ +---------------+----------++ RIGHT VENTRICLE           +---------------+----------++ RV S prime:    12.10 cm/s +---------------+----------++ TAPSE (M-mode):2.2 cm     +---------------+----------++ RVSP:          24.2 mmHg  +---------------+----------++ +---------------+-------++-----------++ LEFT ATRIUM           Index       +---------------+-------++-----------++ LA diam:       2.80 cm1.75 cm/m  +---------------+-------++-----------++ LA Vol (A2C):  51.5 ml32.22 ml/m +---------------+-------++-----------++ LA Vol (A4C):  56.7 ml35.48 ml/m +---------------+-------++-----------++ LA Biplane Vol:57.8 ml36.17 ml/m +---------------+-------++-----------++ +------------+---------++-----------++ RIGHT ATRIUM         Index       +------------+---------++-----------++ RA Pressure:3.00 mmHg            +------------+---------++-----------++ RA Area:    11.30 cm            +------------+---------++-----------++ RA Volume:  24.70 ml 15.45 ml/m +------------+---------++-----------++  +------------+-----------++ AORTIC VALVE            +------------+-----------++ LVOT Vmax:   122.00 cm/s +------------+-----------++ LVOT Vmean: 86.500 cm/s +------------+-----------++ LVOT VTI:   0.330 m     +------------+-----------++  +-------------+-------++ AORTA                +-------------+-------++ Ao Root diam:3.00 cm +-------------+-------++ +--------------+-----------++ +---------------+-----------++ MITRAL VALVE              TRICUSPID VALVE            +--------------+-----------++ +---------------+-----------++ MV Area (PHT):2.08 cm    TR Peak grad:  21.2 mmHg   +--------------+-----------++ +---------------+-----------++ MV PHT:       106.00 msec TR Vmax:       230.00 cm/s +--------------+-----------++ +---------------+-----------++ +--------------+-----------++ Estimated RAP: 3.00 mmHg   MV E velocity:111.00 cm/s +---------------+-----------++ +--------------+-----------++ RVSP:          24.2 mmHg   MV A velocity:156.00 cm/s +---------------+-----------++ +--------------+-----------++ MV E/A ratio: 0.71        +--------------+-------+ +--------------+-----------++ SHUNTS                                              +--------------+-------+                               Systemic VTI: 0.33 m                                +--------------+-------+                               Systemic Diam:2.00 cm                               +--------------+-------+  Ola Berger MD Electronically signed by Ola Berger MD Signature Date/Time: 10/24/2018/1:35:21 PM    Final    MR BRAIN WO CONTRAST Result Date: 10/24/2018 CLINICAL DATA:  Initial evaluation for new onset right-sided arm and leg weakness. EXAM: MRI HEAD WITHOUT CONTRAST MRA HEAD WITHOUT CONTRAST TECHNIQUE: Multiplanar, multiecho pulse sequences of the brain and surrounding structures were obtained without intravenous contrast. Angiographic images of the head were obtained using MRA technique without contrast. COMPARISON:  Prior CT from earlier the same day. FINDINGS: MRI HEAD  FINDINGS Brain: Cerebral volume within normal limits for age. Minimal T2/FLAIR hyperintensity noted within the periventricular white matter, nonspecific, and felt to be within normal limits for age. 7 mm focus of diffusion abnormality seen at the mid-posterior left centrum semi ovale (series 5, image 80). No associated hemorrhage. No other evidence for acute or subacute ischemia. Gray-white matter differentiation otherwise maintained. No other areas of remote cortical infarction. No foci of susceptibility artifact to suggest acute or chronic intracranial hemorrhage. No mass lesion, midline shift or mass effect. No hydrocephalus. No extra-axial fluid collection. Pituitary gland suprasellar region normal. Midline structures intact. Vascular: Major intracranial vascular flow voids are maintained. Skull and upper cervical spine: Craniocervical junction normal. Bone marrow signal intensity within normal limits. Hyperostosis frontalis interna noted. Scalp soft tissues unremarkable. Sinuses/Orbits: Patient status post bilateral ocular lens replacement. Globes orbital soft tissues demonstrate no acute finding. Paranasal sinuses are clear. No mastoid effusion. Inner ear structures normal. Other: None. MRA HEAD FINDINGS ANTERIOR CIRCULATION: Distal cervical segments of the internal carotid arteries are patent with symmetric antegrade flow. Petrous segments widely patent bilaterally. Scattered atheromatous irregularity within the cavernous/supraclinoid ICAs. No significant stenosis seen on the right. There is a short-segment severe stenosis at the anterior genu of the cavernous left ICA (series 9, image 104). ICA termini well perfused. A1 segments patent bilaterally. Normal anterior communicating artery. Anterior cerebral arteries widely patent to their distal aspects. M1 segments widely patent bilaterally. Normal MCA bifurcations. Distal MCA branches well perfused and symmetric. Mild distal small vessel atheromatous  irregularity. POSTERIOR CIRCULATION: Vertebral arteries patent to the vertebrobasilar junction without stenosis. Left vertebral artery dominant. Posterior inferior cerebral arteries patent bilaterally. Basilar patent to its distal aspect without stenosis. Superior cerebral arteries patent bilaterally. Both of the posterior cerebral arteries primarily supplied via the basilar and are well perfused to their distal aspects. No intracranial aneurysm. IMPRESSION: MRI HEAD IMPRESSION: 1. 7 mm acute ischemic nonhemorrhagic infarct involving the deep white matter of the mid-posterior left centrum semi ovale. 2. Otherwise normal brain MRI for age. MRA HEAD IMPRESSION: 1. Negative intracranial MRA for large vessel occlusion. 2. Short-segment severe cavernous left ICA stenosis. 3. Otherwise negative intracranial MRA. No other hemodynamically significant or correctable stenosis. Electronically Signed   By: Virgia Griffins M.D.   On: 10/24/2018 06:59   MR MRA HEAD WO CONTRAST Result Date: 10/24/2018 CLINICAL DATA:  Initial evaluation for new onset right-sided arm and leg weakness. EXAM: MRI HEAD WITHOUT CONTRAST MRA HEAD WITHOUT CONTRAST TECHNIQUE: Multiplanar, multiecho pulse sequences of the brain and surrounding structures were obtained without intravenous contrast. Angiographic images of the head were obtained using MRA technique without contrast. COMPARISON:  Prior CT from earlier the same day. FINDINGS: MRI HEAD FINDINGS Brain: Cerebral volume within normal limits for age. Minimal T2/FLAIR hyperintensity noted within the periventricular white matter, nonspecific, and felt to be within normal limits for age. 7 mm focus of diffusion abnormality seen at the mid-posterior left centrum semi ovale (series 5, image  80). No associated hemorrhage. No other evidence for acute or subacute ischemia. Gray-white matter differentiation otherwise maintained. No other areas of remote cortical infarction. No foci of susceptibility  artifact to suggest acute or chronic intracranial hemorrhage. No mass lesion, midline shift or mass effect. No hydrocephalus. No extra-axial fluid collection. Pituitary gland suprasellar region normal. Midline structures intact. Vascular: Major intracranial vascular flow voids are maintained. Skull and upper cervical spine: Craniocervical junction normal. Bone marrow signal intensity within normal limits. Hyperostosis frontalis interna noted. Scalp soft tissues unremarkable. Sinuses/Orbits: Patient status post bilateral ocular lens replacement. Globes orbital soft tissues demonstrate no acute finding. Paranasal sinuses are clear. No mastoid effusion. Inner ear structures normal. Other: None. MRA HEAD FINDINGS ANTERIOR CIRCULATION: Distal cervical segments of the internal carotid arteries are patent with symmetric antegrade flow. Petrous segments widely patent bilaterally. Scattered atheromatous irregularity within the cavernous/supraclinoid ICAs. No significant stenosis seen on the right. There is a short-segment severe stenosis at the anterior genu of the cavernous left ICA (series 9, image 104). ICA termini well perfused. A1 segments patent bilaterally. Normal anterior communicating artery. Anterior cerebral arteries widely patent to their distal aspects. M1 segments widely patent bilaterally. Normal MCA bifurcations. Distal MCA branches well perfused and symmetric. Mild distal small vessel atheromatous irregularity. POSTERIOR CIRCULATION: Vertebral arteries patent to the vertebrobasilar junction without stenosis. Left vertebral artery dominant. Posterior inferior cerebral arteries patent bilaterally. Basilar patent to its distal aspect without stenosis. Superior cerebral arteries patent bilaterally. Both of the posterior cerebral arteries primarily supplied via the basilar and are well perfused to their distal aspects. No intracranial aneurysm. IMPRESSION: MRI HEAD IMPRESSION: 1. 7 mm acute ischemic  nonhemorrhagic infarct involving the deep white matter of the mid-posterior left centrum semi ovale. 2. Otherwise normal brain MRI for age. MRA HEAD IMPRESSION: 1. Negative intracranial MRA for large vessel occlusion. 2. Short-segment severe cavernous left ICA stenosis. 3. Otherwise negative intracranial MRA. No other hemodynamically significant or correctable stenosis. Electronically Signed   By: Virgia Griffins M.D.   On: 10/24/2018 06:59   CT Head Wo Contrast Result Date: 10/24/2018 CLINICAL DATA:  Right upper extremity heaviness and lower extremity numbness EXAM: CT HEAD WITHOUT CONTRAST TECHNIQUE: Contiguous axial images were obtained from the base of the skull through the vertex without intravenous contrast. COMPARISON:  None. FINDINGS: Brain: No acute territorial infarction, hemorrhage, or intracranial mass. The ventricles are nonenlarged. Vascular: No hyperdense vessels.  Carotid vascular calcification. Skull: Normal. Negative for fracture or focal lesion. Sinuses/Orbits: No acute finding. Other: Chronic appearing abnormality at the anterior arch of C1, incompletely visualized. Prominent degenerative changes at the C1-C2 articulation. IMPRESSION: Negative non contrasted CT appearance of the brain.  Mild atrophy Electronically Signed   By: Esmeralda Hedge M.D.   On: 10/24/2018 01:00   DG Chest Portable 1 View Result Date: 10/24/2018 CLINICAL DATA:  TIA EXAM: PORTABLE CHEST 1 VIEW COMPARISON:  08/14/2017 FINDINGS: The heart size and mediastinal contours are within normal limits. Both lungs are clear. Surgical clips over the axilla bilaterally. Aortic atherosclerosis. IMPRESSION: No active disease. Electronically Signed   By: Esmeralda Hedge M.D.   On: 10/24/2018 00:43    Assessment & Plan:   Problem List Items Addressed This Visit     Depression   Cont on Wellbutrin  XL      Essential hypertension - Primary (Chronic)   Cont on  Amlodipine , Losartan  HCT      Relevant Medications    losartan -hydrochlorothiazide  (HYZAAR) 100-25 MG tablet   Insomnia  Better       Malignant neoplasm of upper-outer quadrant of right breast in female, estrogen receptor positive (HCC) (Chronic)   [name] had a breast ca surgery on 07/07/13 (Dr Alethea Andes: bilateral wire localized lumpectomies and sentinel node mapping) . Finished XRT in 5/15 No lymphedema bilaterally. The patient is refusing to have her blood drawn out of her upper extremities.  Will keep looking for a phlebotomy labs that can collect her blood from the foot.? Phlebotomy clinic,? PICC line RN         Meds ordered this encounter  Medications   clopidogrel  (PLAVIX ) 75 MG tablet    Sig: Take 1 tablet (75 mg total) by mouth daily.    Dispense:  90 tablet    Refill:  3   losartan -hydrochlorothiazide  (HYZAAR) 100-25 MG tablet    Sig: Take 1 tablet by mouth daily.    Dispense:  90 tablet    Refill:  3      Follow-up: Return in about 3 months (around 12/23/2023) for a follow-up visit.  Anitra Barn, MD

## 2023-09-23 NOTE — Patient Instructions (Signed)
 Can you take blood on your arm with a mastectomy? If needed, it is safe to have these performed in either arm. However, your preferences on usage/selection of arm for these tests should be respected. Research shows that you should avoid blood draws and blood pressure in the arm only if you have been diagnosed with lymphedema or have history of lymphedema in that arm.

## 2023-09-23 NOTE — Assessment & Plan Note (Signed)
Cont on  Amlodipine, Losartan HCT

## 2023-09-29 NOTE — Assessment & Plan Note (Signed)
 Cynthia Garza had a breast ca surgery on 07/07/13 (Dr Alethea Andes: bilateral wire localized lumpectomies and sentinel node mapping) . Finished XRT in 5/15 No lymphedema bilaterally. The patient is refusing to have her blood drawn out of her upper extremities.  Will keep looking for a phlebotomy labs that can collect her blood from the foot.? Phlebotomy clinic,? PICC line RN

## 2023-10-06 DIAGNOSIS — H353222 Exudative age-related macular degeneration, left eye, with inactive choroidal neovascularization: Secondary | ICD-10-CM | POA: Diagnosis not present

## 2023-10-09 ENCOUNTER — Other Ambulatory Visit: Payer: Self-pay | Admitting: Internal Medicine

## 2023-10-09 DIAGNOSIS — I10 Essential (primary) hypertension: Secondary | ICD-10-CM

## 2023-10-09 DIAGNOSIS — Z0000 Encounter for general adult medical examination without abnormal findings: Secondary | ICD-10-CM

## 2023-10-09 DIAGNOSIS — E785 Hyperlipidemia, unspecified: Secondary | ICD-10-CM

## 2023-10-13 DIAGNOSIS — H43813 Vitreous degeneration, bilateral: Secondary | ICD-10-CM | POA: Diagnosis not present

## 2023-10-13 DIAGNOSIS — H353123 Nonexudative age-related macular degeneration, left eye, advanced atrophic without subfoveal involvement: Secondary | ICD-10-CM | POA: Diagnosis not present

## 2023-10-13 DIAGNOSIS — H353111 Nonexudative age-related macular degeneration, right eye, early dry stage: Secondary | ICD-10-CM | POA: Diagnosis not present

## 2023-11-05 DIAGNOSIS — L570 Actinic keratosis: Secondary | ICD-10-CM | POA: Diagnosis not present

## 2023-11-05 DIAGNOSIS — D485 Neoplasm of uncertain behavior of skin: Secondary | ICD-10-CM | POA: Diagnosis not present

## 2023-11-05 DIAGNOSIS — L821 Other seborrheic keratosis: Secondary | ICD-10-CM | POA: Diagnosis not present

## 2023-11-05 DIAGNOSIS — D225 Melanocytic nevi of trunk: Secondary | ICD-10-CM | POA: Diagnosis not present

## 2023-11-05 DIAGNOSIS — L720 Epidermal cyst: Secondary | ICD-10-CM | POA: Diagnosis not present

## 2023-11-05 DIAGNOSIS — D224 Melanocytic nevi of scalp and neck: Secondary | ICD-10-CM | POA: Diagnosis not present

## 2023-11-05 DIAGNOSIS — C44722 Squamous cell carcinoma of skin of right lower limb, including hip: Secondary | ICD-10-CM | POA: Diagnosis not present

## 2023-11-21 ENCOUNTER — Other Ambulatory Visit: Payer: Self-pay | Admitting: Internal Medicine

## 2023-11-26 ENCOUNTER — Other Ambulatory Visit: Payer: Self-pay | Admitting: Internal Medicine

## 2023-12-14 ENCOUNTER — Ambulatory Visit (INDEPENDENT_AMBULATORY_CARE_PROVIDER_SITE_OTHER)

## 2023-12-14 VITALS — Ht 61.0 in | Wt 139.0 lb

## 2023-12-14 DIAGNOSIS — Z0000 Encounter for general adult medical examination without abnormal findings: Secondary | ICD-10-CM

## 2023-12-14 NOTE — Patient Instructions (Signed)
 Cynthia Garza , Thank you for taking time out of your busy schedule to complete your Annual Wellness Visit with me. I enjoyed our conversation and look forward to speaking with you again next year. I, as well as your care team,  appreciate your ongoing commitment to your health goals. Please review the following plan we discussed and let me know if I can assist you in the future. Your Game plan/ To Do List   Follow up Visits: Next Medicare AWV with our clinical staff: 12/14/2024.   Have you seen your provider in the last 6 months (3 months if uncontrolled diabetes)? Yes Next Office Visit with your provider: 12/25/2023.  Clinician Recommendations:  Aim for 30 minutes of exercise or brisk walking, 6-8 glasses of water, and 5 servings of fruits and vegetables each day. You are due for a Shingles vaccine and can get that done at your local pharmacy.        This is a list of the screening recommended for you and due dates:  Health Maintenance  Topic Date Due   Zoster (Shingles) Vaccine (1 of 2) 01/27/1952   COVID-19 Vaccine (3 - Moderna risk series) 03/09/2020   Flu Shot  12/25/2023   Medicare Annual Wellness Visit  12/13/2024   DTaP/Tdap/Td vaccine (3 - Td or Tdap) 12/17/2025   Pneumococcal Vaccine for age over 19  Completed   DEXA scan (bone density measurement)  Completed   Hepatitis B Vaccine  Aged Out   HPV Vaccine  Aged Out   Meningitis B Vaccine  Aged Out    Advanced directives: (Copy Requested) Please bring a copy of your health care power of attorney and living will to the office to be added to your chart at your convenience. You can mail to [facility] [address] [place], [place] [address] or email to [email or web address] Advance Care Planning is important because it:  [x]  Makes sure you receive the medical care that is consistent with your values, goals, and preferences  [x]  It provides guidance to your family and loved ones and reduces their decisional  burden about whether or not they are making the right decisions based on your wishes.  Follow the link provided in your after visit summary or read over the paperwork we have mailed to you to help you started getting your Advance Directives in place. If you need assistance in completing these, please reach out to us  so that we can help you!  See attachments for Preventive Care and Fall Prevention Tips.

## 2023-12-14 NOTE — Progress Notes (Cosign Needed Addendum)
 Subjective:   Cynthia Garza is a 89 y.o. who presents for a Medicare Wellness preventive visit.  As a reminder, Annual Wellness Visits don't include a physical exam, and some assessments may be limited, especially if this visit is performed virtually. We may recommend an in-person follow-up visit with your provider if needed.  Visit Complete: Virtual I connected with  Orlean GORMAN Hilding on 12/14/23 by a audio enabled telemedicine application and verified that I am speaking with the correct person using two identifiers.  Patient Location: Home  Provider Location: Home Office  I discussed the limitations of evaluation and management by telemedicine. The patient expressed understanding and agreed to proceed.  Vital Signs: Because this visit was a virtual/telehealth visit, some criteria may be missing or patient reported. Any vitals not documented were not able to be obtained and vitals that have been documented are patient reported.  VideoDeclined- This patient declined Librarian, academic. Therefore the visit was completed with audio only.  Persons Participating in Visit: Patient.  AWV Questionnaire: No: Patient Medicare AWV questionnaire was not completed prior to this visit.  Cardiac Risk Factors include: advanced age (>76men, >97 women);hypertension;Other (see comment), Risk factor comments: CAP     Objective:    Today's Vitals   12/14/23 1446  Weight: 139 lb (63 kg)  Height: 5' 1 (1.549 m)   Body mass index is 26.26 kg/m.     12/14/2023    2:52 PM 10/08/2022   10:59 AM 11/29/2021    1:34 PM 11/12/2020    3:31 PM 10/12/2019   12:26 PM 10/24/2018   12:17 AM 01/14/2017   12:02 PM  Advanced Directives  Does Patient Have a Medical Advance Directive? Yes No Yes Yes Yes Yes No   Type of Estate agent of [place];Living will  Healthcare Power of [place];Living will   Healthcare Power of [place];Living will   Does patient  want to make changes to medical advance directive?    No - Patient declined No - Patient declined No - Patient declined    Copy of Healthcare Power of Attorney in Chart? No - copy requested  No - copy requested   No - copy requested    Would patient like information on creating a medical advance directive?  No - Patient declined          Data saved with a previous flowsheet row definition    Current Medications (verified) Outpatient Encounter Medications as of 12/14/2023  Medication Sig   amLODipine  (NORVASC ) 5 MG tablet Take 1 tablet (5 mg total) by mouth daily.   BIOTIN PO Take by mouth.   buPROPion  (WELLBUTRIN  XL) 150 MG 24 hr tablet TAKE 1 TABLET BY MOUTH DAILY   Cholecalciferol (VITAMIN D3) 50 MCG (2000 UT) capsule Take 1 capsule (2,000 Units total) by mouth daily.   clopidogrel  (PLAVIX ) 75 MG tablet Take 1 tablet (75 mg total) by mouth daily.   ezetimibe  (ZETIA ) 10 MG tablet TAKE 1 TABLET BY MOUTH DAILY   Famotidine  (PEPCID  PO) Take by mouth.   levofloxacin  (LEVAQUIN ) 500 MG tablet    losartan -hydrochlorothiazide  (HYZAAR) 100-25 MG tablet Take 1 tablet by mouth daily.   meclizine  (ANTIVERT ) 12.5 MG tablet Take 1 tablet (12.5 mg total) by mouth 2 (two) times daily as needed for dizziness.   Multiple Vitamins-Minerals (ALIVE ONCE DAILY WOMENS 50+) TABS Take 1 tablet by mouth daily.   Multiple Vitamins-Minerals (PRESERVISION AREDS 2 PO) Take 1 tablet by mouth 2 (two)  times daily.   Propylene Glycol 0.6 % SOLN Place 2 drops into both eyes at bedtime.   vitamin B-12 (CYANOCOBALAMIN ) 1000 MCG tablet Take 1,000 mcg by mouth daily.   zolpidem  (AMBIEN ) 10 MG tablet TAKE 1/2-1 TABLET BY MOUTH AT BEDTIME AS NEEDED FOR SLEEP   No facility-administered encounter medications on file as of 12/14/2023.    Allergies (verified) Cholestoff [plant sterols and stanols], Lexapro  [escitalopram  oxalate], Simvastatin, and Diflucan [fluconazole]   History: Past Medical History:  Diagnosis Date   Anxiety     Breast cancer (HCC) 2015   ER+/PR+/Her2-   Depression    Dyslipidemia    GERD (gastroesophageal reflux disease)    Heart murmur    Hemangioma of intra-abdominal structures    HTN (hypertension)    Insomnia    LBP (low back pain)    Osteoporosis    Paresthesia    Radiation 08/08/13-08/29/13   Bilat.breast 42.72 Gy   Renal cyst    Shingles    Past Surgical History:  Procedure Laterality Date   ABDOMINAL HYSTERECTOMY     ?1970's   BREAST LUMPECTOMY WITH NEEDLE LOCALIZATION AND AXILLARY SENTINEL LYMPH NODE BX Bilateral 07/07/2013   Procedure: BREAST LUMPECTOMY WITH NEEDLE LOCALIZATION AND AXILLARY SENTINEL LYMPH NODE BIOPIES;  Surgeon: Deward GORMAN Curvin DOUGLAS, MD;  Location: [facility] OR;  Service: General;  Laterality: Bilateral;   Family History  Problem Relation Age of Onset   Kidney failure Mother    Hypertension Father    Esophageal cancer Father    Coronary artery disease Neg Hx    Social History   Socioeconomic History   Marital status: Widowed    Spouse name: Not on file   Number of children: 2   Years of education: Not on file   Highest education level: Not on file  Occupational History   Occupation: Retired    Associate Professor: RETIRED    Comment: [employer]  Tobacco Use   Smoking status: Never   Smokeless tobacco: Never  Vaping Use   Vaping status: Never Used  Substance and Sexual Activity   Alcohol  use: No   Drug use: No   Sexual activity: Not Currently    Comment: menarche age 20, first live birth [age] yrs of age, P44, menopause 58, no HRT  Other Topics Concern   Not on file  Social History Narrative   Lives alone/2025   Social Drivers of Health   Financial Resource Strain: Low Risk  (11/29/2021)   Overall Financial Resource Strain (CARDIA)    Difficulty of Paying Living Expenses: Not hard at all  Food Insecurity: No Food Insecurity (11/29/2021)   Hunger Vital Sign    Worried About Running Out of Food in the Last Year: Never true    Ran Out of Food in the Last Year:  Never true  Transportation Needs: No Transportation Needs (12/14/2023)   PRAPARE - Administrator, Civil Service (Medical): No    Lack of Transportation (Non-Medical): No  Physical Activity: Inactive (12/14/2023)   Exercise Vital Sign    Days of Exercise per Week: 0 days    Minutes of Exercise per Session: 0 min  Stress: Stress Concern Present (12/14/2023)   [employer] of Occupational Health - Occupational Stress Questionnaire    Feeling of Stress: To some extent  Social Connections: Moderately Integrated (12/14/2023)   Social Connection and Isolation Panel    Frequency of Communication with Friends and Family: More than three times a week    Frequency  of Social Gatherings with Friends and Family: More than three times a week    Attends Religious Services: More than 4 times per year    Active Member of [employer] or Organizations: Yes    Attends Banker Meetings: Never    Marital Status: Widowed    Tobacco Counseling Counseling given: Not Answered    Clinical Intake:  Pre-visit preparation completed: Yes  Pain : No/denies pain     BMI - recorded: 26.26 Nutritional Status: BMI 25 -29 Overweight Nutritional Risks: None Diabetes: No  Lab Results  Component Value Date   HGBA1C 5.6 10/24/2018   HGBA1C 5.7 01/02/2011   HGBA1C 5.8 09/04/2009     How often do you need to have someone help you when you read instructions, pamphlets, or other written materials from your doctor or pharmacy?: 1 - Never  Interpreter Needed?: No  Information entered by :: Amaiya Scruton, RMA   Activities of Daily Living     12/14/2023    2:49 PM  In your present state of health, do you have any difficulty performing the following activities:  Hearing? 1  Comment per pt sometimes hard of hearing  Vision? 0  Difficulty concentrating or making decisions? 0  Walking or climbing stairs? 0  Dressing or bathing? 0  Doing errands, shopping? 0  Preparing Food and eating ?  N  Using the Toilet? N  In the past six months, have you accidently leaked urine? N  Do you have problems with loss of bowel control? N  Managing your Medications? N  Managing your Finances? N  Housekeeping or managing your Housekeeping? N    Patient Care Team: Plotnikov, Karlynn GAILS, MD as PCP - General (Internal Medicine) Magrinat, Sandria BROCKS, MD (Inactive) as Consulting Physician (Oncology) Curvin Deward MOULD, MD as Consulting Physician (General Surgery) Keenan Hastings, MD as Referring Physician (Radiation Oncology) Dunn, Peter K as Consulting Physician (Optometry)  I have updated your Care Teams any recent Medical Services you may have received from other providers in the past year.     Assessment:   This is a routine wellness examination for [place].  Hearing/Vision screen Hearing Screening - Comments:: per pt sometimes hard of hearing Vision Screening - Comments:: Has eyeglasses to wear for reading/Dr. Jarold and Dr. Robinson    Goals Addressed             This Visit's Progress    Patient Stated   On track    To stay safe and alive.       Depression Screen     12/14/2023    3:02 PM 03/12/2023    2:17 PM 10/07/2022   11:42 AM 11/29/2021    1:35 PM 11/13/2021    9:31 AM 11/12/2020    3:27 PM 01/16/2020    2:55 PM  PHQ 2/9 Scores  PHQ - 2 Score 0 0 0 0 0 1 0  PHQ- 9 Score 4          Fall Risk     12/14/2023    2:58 PM 03/12/2023    2:17 PM 10/07/2022   11:42 AM 11/29/2021    1:35 PM 11/13/2021    9:31 AM  Fall Risk   Falls in the past year? 0 0 0 0 0  Number falls in past yr:  0 0 0 0  Injury with Fall?   0 0 0  Risk for fall due to : Impaired balance/gait No Fall Risks No Fall Risks Medication side  effect No Fall Risks  Follow up Falls evaluation completed;Falls prevention discussed Falls evaluation completed Falls evaluation completed Education provided;Falls evaluation completed;Falls prevention discussed  Falls evaluation completed      Data saved with a  previous flowsheet row definition    MEDICARE RISK AT HOME:  Medicare Risk at Home Any stairs in or around the home?: No If so, are there any without handrails?: No Home free of loose throw rugs in walkways, pet beds, electrical cords, etc?: Yes Adequate lighting in your home to reduce risk of falls?: Yes Life alert?: No Use of a cane, walker or w/c?: No Grab bars in the bathroom?: Yes Shower chair or bench in shower?: Yes Elevated toilet seat or a handicapped toilet?: Yes  TIMED UP AND GO:  Was the test performed?  No  Cognitive Function: Declined/Normal: No cognitive concerns noted by patient or family. Patient alert, oriented, able to answer questions appropriately and recall recent events. No signs of memory loss or confusion.        11/29/2021    1:36 PM  6CIT Screen  What Year? 0 points  What month? 0 points  What time? 0 points  Count back from 20 0 points  Months in reverse 0 points  Repeat phrase 2 points  Total Score 2 points    Immunizations Immunization History  Administered Date(s) Administered   Fluad Quad(high Dose 65+) 04/13/2019, 05/14/2020, 02/12/2021, 04/03/2022   Fluad Trivalent(High Dose 65+) 03/12/2023   Influenza Split 03/26/2012, 04/11/2015   Influenza Whole 03/07/2008, 04/26/2009, 05/13/2010   Influenza, High Dose Seasonal PF 07/21/2016, 03/13/2017, 04/07/2018   Influenza,inj,Quad PF,6+ Mos 02/14/2013, 01/17/2014   Influenza-Unspecified 02/24/2015   Moderna SARS-COV2 Booster Vaccination 02/10/2020   Moderna Sars-Covid-2 Vaccination 07/07/2019, 08/08/2019   Pneumococcal Conjugate-13 05/16/2013   Pneumococcal Polysaccharide-23 02/10/2007, 10/23/2015   Td 12/13/1998   Tdap 12/18/2015   Zoster, Live 07/11/2008    Screening Tests Health Maintenance  Topic Date Due   Zoster Vaccines- Shingrix (1 of 2) 01/27/1952   COVID-19 Vaccine (3 - Moderna risk series) 03/09/2020   INFLUENZA VACCINE  12/25/2023   Medicare Annual Wellness (AWV)   12/13/2024   DTaP/Tdap/Td (3 - Td or Tdap) 12/17/2025   Pneumococcal Vaccine: 50+ Years  Completed   DEXA SCAN  Completed   Hepatitis B Vaccines  Aged Out   HPV VACCINES  Aged Out   Meningococcal B Vaccine  Aged Out    Health Maintenance  Health Maintenance Due  Topic Date Due   Zoster Vaccines- Shingrix (1 of 2) 01/27/1952   COVID-19 Vaccine (3 - Moderna risk series) 03/09/2020   Health Maintenance Items Addressed: See Nurse Notes at the end of this note  Additional Screening:  Vision Screening: Recommended annual ophthalmology exams for early detection of glaucoma and other disorders of the eye. Would you like a referral to an eye doctor? No    Dental Screening: Recommended annual dental exams for proper oral hygiene  Community Resource Referral / Chronic Care Management: CRR required this visit?  No   CCM required this visit?  No   Plan:    I have personally reviewed and noted the following in the patient's chart:   Medical and social history Use of alcohol , tobacco or illicit drugs  Current medications and supplements including opioid prescriptions. Patient is not currently taking opioid prescriptions. Functional ability and status Nutritional status Physical activity Advanced directives List of other physicians Hospitalizations, surgeries, and ER visits in previous 12 months Vitals Screenings to include  cognitive, depression, and falls Referrals and appointments  In addition, I have reviewed and discussed with patient certain preventive protocols, quality metrics, and best practice recommendations. A written personalized care plan for preventive services as well as general preventive health recommendations were provided to patient.   Trenyce Loera L Elainna Eshleman, CMA   12/14/2023   After Visit Summary: (MyChart) Due to this being a telephonic visit, the after visit summary with patients personalized plan was offered to patient via MyChart   Notes: Patient is due for a  Shingrix vaccine but would like to discuss with Dr. Garald, as she has had a Zoster vaccine in 2010.  Patient complained of having numbness and burning sensation in her feet during the night, x 2-3 nights.  Patient would like to discuss this with Dr. Garald during her next office visit.  She also is requesting a refill for Meclizine , due to her vertigo coming back within the last couple of days.    Medical screening examination/treatment/procedure(s) were performed by non-physician practitioner and as supervising physician I was immediately available for consultation/collaboration.  I agree with above. Karlynn Garald, MD

## 2023-12-25 ENCOUNTER — Encounter: Payer: Self-pay | Admitting: Internal Medicine

## 2023-12-25 ENCOUNTER — Ambulatory Visit: Admitting: Internal Medicine

## 2023-12-25 VITALS — BP 138/76 | HR 70 | Temp 97.8°F | Ht 61.0 in | Wt 136.0 lb

## 2023-12-25 DIAGNOSIS — R42 Dizziness and giddiness: Secondary | ICD-10-CM | POA: Diagnosis not present

## 2023-12-25 DIAGNOSIS — F419 Anxiety disorder, unspecified: Secondary | ICD-10-CM

## 2023-12-25 DIAGNOSIS — F5101 Primary insomnia: Secondary | ICD-10-CM | POA: Diagnosis not present

## 2023-12-25 DIAGNOSIS — E785 Hyperlipidemia, unspecified: Secondary | ICD-10-CM | POA: Diagnosis not present

## 2023-12-25 DIAGNOSIS — I10 Essential (primary) hypertension: Secondary | ICD-10-CM

## 2023-12-25 DIAGNOSIS — L570 Actinic keratosis: Secondary | ICD-10-CM | POA: Insufficient documentation

## 2023-12-25 DIAGNOSIS — Z0000 Encounter for general adult medical examination without abnormal findings: Secondary | ICD-10-CM

## 2023-12-25 DIAGNOSIS — F32A Depression, unspecified: Secondary | ICD-10-CM

## 2023-12-25 LAB — COMPREHENSIVE METABOLIC PANEL WITH GFR
ALT: 10 U/L (ref 0–35)
AST: 17 U/L (ref 0–37)
Albumin: 4.4 g/dL (ref 3.5–5.2)
Alkaline Phosphatase: 63 U/L (ref 39–117)
BUN: 20 mg/dL (ref 6–23)
CO2: 29 meq/L (ref 19–32)
Calcium: 9.6 mg/dL (ref 8.4–10.5)
Chloride: 102 meq/L (ref 96–112)
Creatinine, Ser: 0.91 mg/dL (ref 0.40–1.20)
GFR: 56.17 mL/min — ABNORMAL LOW (ref >60.00)
Glucose, Bld: 109 mg/dL — ABNORMAL HIGH (ref 70–99)
Potassium: 3.9 meq/L (ref 3.5–5.1)
Sodium: 141 meq/L (ref 135–145)
Total Bilirubin: 0.5 mg/dL (ref 0.2–1.2)
Total Protein: 7 g/dL (ref 6.0–8.3)

## 2023-12-25 LAB — LIPID PANEL
Cholesterol: 191 mg/dL (ref 0–200)
HDL: 61 mg/dL (ref >39.00)
LDL Cholesterol: 98 mg/dL (ref 0–99)
NonHDL: 130.46
Total CHOL/HDL Ratio: 3
Triglycerides: 162 mg/dL — ABNORMAL HIGH (ref 0.0–149.0)
VLDL: 32.4 mg/dL (ref 0.0–40.0)

## 2023-12-25 LAB — URINALYSIS, ROUTINE W REFLEX MICROSCOPIC
Bilirubin Urine: NEGATIVE
Hgb urine dipstick: NEGATIVE
Ketones, ur: NEGATIVE
Nitrite: NEGATIVE
Specific Gravity, Urine: 1.02 (ref 1.000–1.030)
Total Protein, Urine: NEGATIVE
Urine Glucose: NEGATIVE
Urobilinogen, UA: 0.2 (ref 0.0–1.0)
pH: 6 (ref 5.0–8.0)

## 2023-12-25 LAB — CBC WITH DIFFERENTIAL/PLATELET
Basophils Absolute: 0.1 K/uL (ref 0.0–0.1)
Basophils Relative: 0.6 % (ref 0.0–3.0)
Eosinophils Absolute: 0.1 K/uL (ref 0.0–0.7)
Eosinophils Relative: 1.1 % (ref 0.0–5.0)
HCT: 43.1 % (ref 36.0–46.0)
Hemoglobin: 14.2 g/dL (ref 12.0–15.0)
Lymphocytes Relative: 26 % (ref 12.0–46.0)
Lymphs Abs: 2.4 K/uL (ref 0.7–4.0)
MCHC: 33 g/dL (ref 30.0–36.0)
MCV: 85.2 fl (ref 78.0–100.0)
Monocytes Absolute: 1 K/uL (ref 0.1–1.0)
Monocytes Relative: 10.8 % (ref 3.0–12.0)
Neutro Abs: 5.7 K/uL (ref 1.4–7.7)
Neutrophils Relative %: 61.5 % (ref 43.0–77.0)
Platelets: 235 K/uL (ref 150.0–400.0)
RBC: 5.06 Mil/uL (ref 3.87–5.11)
RDW: 13.9 % (ref 11.5–15.5)
WBC: 9.3 K/uL (ref 4.0–10.5)

## 2023-12-25 LAB — TSH: TSH: 1.94 u[IU]/mL (ref 0.35–5.50)

## 2023-12-25 MED ORDER — ZOLPIDEM TARTRATE 10 MG PO TABS: 10.0000 mg | ORAL_TABLET | Freq: Every day | ORAL | 1 refills | Status: AC

## 2023-12-25 MED ORDER — AMLODIPINE BESYLATE 5 MG PO TABS: 5.0000 mg | ORAL_TABLET | Freq: Every day | ORAL | 3 refills | Status: AC

## 2023-12-25 MED ORDER — MECLIZINE HCL 12.5 MG PO TABS: 12.5000 mg | ORAL_TABLET | Freq: Two times a day (BID) | ORAL | 2 refills | Status: AC | PRN

## 2023-12-25 NOTE — Progress Notes (Signed)
 Subjective:  Patient ID: Cynthia Garza, female    DOB: 10/08/1932  Age: 89 y.o. MRN: [id number]  CC: Medical Management of Chronic Issues (3 mnth f/u )   HPI Cynthia Garza presents for anxiety, insomnia, depression, vertigo f/u  Outpatient Medications Prior to Visit  Medication Sig Dispense Refill   BIOTIN PO Take by mouth.     buPROPion  (WELLBUTRIN  XL) 150 MG 24 hr tablet TAKE 1 TABLET BY MOUTH DAILY 90 tablet 1   Cholecalciferol (VITAMIN D3) 50 MCG (2000 UT) capsule Take 1 capsule (2,000 Units total) by mouth daily. 100 capsule 3   clopidogrel  (PLAVIX ) 75 MG tablet Take 1 tablet (75 mg total) by mouth daily. 90 tablet 3   ezetimibe  (ZETIA ) 10 MG tablet TAKE 1 TABLET BY MOUTH DAILY 90 tablet 3   Famotidine  (PEPCID  PO) Take by mouth.     levofloxacin  (LEVAQUIN ) 500 MG tablet      losartan -hydrochlorothiazide  (HYZAAR) 100-25 MG tablet Take 1 tablet by mouth daily. 90 tablet 3   Multiple Vitamins-Minerals (ALIVE ONCE DAILY WOMENS 50+) TABS Take 1 tablet by mouth daily.     Multiple Vitamins-Minerals (PRESERVISION AREDS 2 PO) Take 1 tablet by mouth 2 (two) times daily.     Propylene Glycol 0.6 % SOLN Place 2 drops into both eyes at bedtime.     vitamin B-12 (CYANOCOBALAMIN ) 1000 MCG tablet Take 1,000 mcg by mouth daily.     amLODipine  (NORVASC ) 5 MG tablet Take 1 tablet (5 mg total) by mouth daily. 90 tablet 1   meclizine  (ANTIVERT ) 12.5 MG tablet Take 1 tablet (12.5 mg total) by mouth 2 (two) times daily as needed for dizziness. 60 tablet 2   zolpidem  (AMBIEN ) 10 MG tablet TAKE 1/2-1 TABLET BY MOUTH AT BEDTIME AS NEEDED FOR SLEEP 90 tablet 1   No facility-administered medications prior to visit.    ROS: Review of Systems  Constitutional:  Positive for fatigue. Negative for activity change, appetite change, chills and unexpected weight change.  HENT:  Negative for congestion, mouth sores and sinus pressure.   Eyes:  Negative for visual disturbance.  Respiratory:  Negative  for cough and chest tightness.   Gastrointestinal:  Negative for abdominal pain and nausea.  Genitourinary:  Negative for difficulty urinating, frequency and vaginal pain.  Musculoskeletal:  Negative for back pain and gait problem.  Skin:  Negative for pallor and rash.  Neurological:  Negative for dizziness, tremors, weakness, numbness and headaches.  Psychiatric/Behavioral:  Positive for sleep disturbance. Negative for confusion, dysphoric mood and suicidal ideas. The patient is nervous/anxious.     Objective:  BP 138/76   Pulse 70   Temp 97.8 F (36.6 C) (Oral)   Ht 5' 1 (1.549 m)   Wt 136 lb (61.7 kg)   SpO2 98%   BMI 25.70 kg/m   BP Readings from Last 3 Encounters:  12/25/23 138/76  09/23/23 132/68  03/12/23 122/68    Wt Readings from Last 3 Encounters:  12/25/23 136 lb (61.7 kg)  12/14/23 139 lb (63 kg)  09/23/23 139 lb 3.2 oz (63.1 kg)    Physical Exam Constitutional:      General: She is not in acute distress.    Appearance: She is well-developed.  HENT:     Head: Normocephalic.     Right Ear: External ear normal.     Left Ear: External ear normal.     Nose: Nose normal.  Eyes:     General:  Right eye: No discharge.        Left eye: No discharge.     Conjunctiva/sclera: Conjunctivae normal.     Pupils: Pupils are equal, round, and reactive to light.  Neck:     Thyroid : No thyromegaly.     Vascular: No JVD.     Trachea: No tracheal deviation.  Cardiovascular:     Rate and Rhythm: Normal rate and regular rhythm.     Heart sounds: Normal heart sounds.  Pulmonary:     Effort: No respiratory distress.     Breath sounds: No stridor. No wheezing.  Abdominal:     General: Bowel sounds are normal. There is no distension.     Palpations: Abdomen is soft. There is no mass.     Tenderness: There is no abdominal tenderness. There is no guarding or rebound.  Musculoskeletal:        General: No tenderness.     Cervical back: Normal range of motion and  neck supple. No rigidity.     Right lower leg: No edema.     Left lower leg: No edema.  Lymphadenopathy:     Cervical: No cervical adenopathy.  Skin:    Findings: No erythema or rash.  Neurological:     Mental Status: She is oriented to person, place, and time.     Cranial Nerves: No cranial nerve deficit.     Motor: No abnormal muscle tone.     Coordination: Coordination normal.     Deep Tendon Reflexes: Reflexes normal.  Psychiatric:        Behavior: Behavior normal.        Thought Content: Thought content normal.        Judgment: Judgment normal.   No UE or LE edema  Lab Results  Component Value Date   WBC 9.6 04/03/2022   HGB 13.2 04/03/2022   HCT 39.4 04/03/2022   PLT 222.0 04/03/2022   GLUCOSE 92 10/07/2022   CHOL 192 04/03/2022   TRIG 204.0 (H) 04/03/2022   HDL 61.10 04/03/2022   LDLDIRECT 114.0 04/03/2022   LDLCALC 100 (H) 05/15/2021   ALT 10 10/07/2022   AST 16 10/07/2022   NA 141 10/07/2022   K 3.7 10/07/2022   CL 103 10/07/2022   CREATININE 0.90 10/07/2022   BUN 20 10/07/2022   CO2 28 10/07/2022   TSH 1.33 04/03/2022   INR 1.0 10/24/2018   HGBA1C 5.6 10/24/2018    VAS US  CAROTID (at [facility] and [facility] only) Result Date: 10/25/2018 Carotid Arterial Duplex Study Indications:  CVA. Risk Factors: Hypertension. Performing Technologist: Duwaine Needle RVS  Examination Guidelines: A complete evaluation includes B-mode imaging, spectral Doppler, color Doppler, and power Doppler as needed of all accessible portions of each vessel. Bilateral testing is considered an integral part of a complete examination. Limited examinations for reoccurring indications may be performed as noted.  Right Carotid Findings: +----------+--------+--------+--------+------------+--------+           PSV cm/sEDV cm/sStenosisDescribe    Comments +----------+--------+--------+--------+------------+--------+ CCA Prox  110     19              heterogenous          +----------+--------+--------+--------+------------+--------+ CCA Distal103     19              heterogenous         +----------+--------+--------+--------+------------+--------+ ICA Prox  73      17      1-39%   heterogenous         +----------+--------+--------+--------+------------+--------+  ICA Distal106     24                                   +----------+--------+--------+--------+------------+--------+ ECA       94      6                                    +----------+--------+--------+--------+------------+--------+ +----------+--------+-------+--------+-------------------+           PSV cm/sEDV cmsDescribeArm Pressure (mmHG) +----------+--------+-------+--------+-------------------+ [id number]                                        +----------+--------+-------+--------+-------------------+ +---------+--------+--+--------+--+---------+ VertebralPSV cm/s37EDV cm/s10Antegrade +---------+--------+--+--------+--+---------+  Left Carotid Findings: +----------+--------+--------+--------+------------+--------+           PSV cm/sEDV cm/sStenosisDescribe    Comments +----------+--------+--------+--------+------------+--------+ CCA Prox  132     14              heterogenous         +----------+--------+--------+--------+------------+--------+ CCA Distal100     17              heterogenous         +----------+--------+--------+--------+------------+--------+ ICA Prox  107     25      1-39%   heterogenous         +----------+--------+--------+--------+------------+--------+ ICA Distal82      13                                   +----------+--------+--------+--------+------------+--------+ ECA       80                                           +----------+--------+--------+--------+------------+--------+ +----------+--------+--------+--------+-------------------+ SubclavianPSV cm/sEDV cm/sDescribeArm Pressure (mmHG)  +----------+--------+--------+--------+-------------------+           138                                         +----------+--------+--------+--------+-------------------+ +---------+--------+--+--------+--+---------+ VertebralPSV cm/s67EDV cm/s17Antegrade +---------+--------+--+--------+--+---------+  Summary: Right Carotid: Velocities in the right ICA are consistent with a 1-39% stenosis. Left Carotid: Velocities in the left ICA are consistent with a 1-39% stenosis. Vertebrals: Bilateral vertebral arteries demonstrate antegrade flow. *See table(s) above for measurements and observations.  Electronically signed by Eather Popp MD on 10/25/2018 at 12:23:35 PM.    Final    ECHOCARDIOGRAM COMPLETE Result Date: 10/24/2018   ECHOCARDIOGRAM REPORT   Patient Name:   LIZ PINHO Date of Exam: 10/24/2018 Medical Rec #:  [id number]           Height:       61.0 in Accession #:    [id number]          Weight:       135.0 lb Date of Birth:  April 10, 1933            BSA:          1.60 m Patient Age:    54 years  BP:           118/57 mmHg Patient Gender: F                   HR:           75 bpm. Exam Location:  Inpatient  Procedure: 2D Echo Indications:    Stroke 434.91  History:        Patient has no prior history of Echocardiogram examinations.                 Signs/Symptoms: Murmur Risk Factors: Hypertension and                 Dyslipidemia.  Sonographer:    Augustin Seals RDCS (AE) Referring Phys: [phone] JARED M GARDNER IMPRESSIONS  1. The left ventricle has hyperdynamic systolic function, with an ejection fraction of >65%. The cavity size was normal. Left ventricular diastolic Doppler parameters are consistent with impaired relaxation.  2. The right ventricle has normal systolic function. The cavity was normal. There is no increase in right ventricular wall thickness.  3. Left atrial size was mildly dilated.  4. The mitral valve is abnormal. Mild thickening of the mitral valve leaflet. There is  moderate mitral annular calcification present.  5. The aortic valve is tricuspid. Mild thickening of the aortic valve. Moderate calcification of the aortic valve.  6. The interatrial septum was not assessed. FINDINGS  Left Ventricle: The left ventricle has hyperdynamic systolic function, with an ejection fraction of >65%. The cavity size was normal. The left ventricular wall thickness was not assessed. Left ventricular diastolic Doppler parameters are consistent with  impaired relaxation. Right Ventricle: The right ventricle has normal systolic function. The cavity was normal. There is no increase in right ventricular wall thickness. Left Atrium: Left atrial size was mildly dilated. Right Atrium: Right atrial size was normal in size. Right atrial pressure is estimated at 10 mmHg. Interatrial Septum: The interatrial septum was not assessed. Pericardium: There is no evidence of pericardial effusion. Mitral Valve: The mitral valve is abnormal. Mild thickening of the mitral valve leaflet. There is moderate mitral annular calcification present. Mitral valve regurgitation is trivial by color flow Doppler. Tricuspid Valve: The tricuspid valve is normal in structure. Tricuspid valve regurgitation is trivial by color flow Doppler. Aortic Valve: The aortic valve is tricuspid Mild thickening of the aortic valve. Moderate calcification of the aortic valve. Aortic valve regurgitation was not visualized by color flow Doppler. Pulmonic Valve: The pulmonic valve was normal in structure. Pulmonic valve regurgitation is not visualized by color flow Doppler. Venous: The inferior vena cava is normal in size with greater than 50% respiratory variability.  +--------------+--------++ LEFT VENTRICLE         +----------------+---------++ +--------------+--------++ Diastology                PLAX 2D                +----------------+---------++ +--------------+--------++ LV e' lateral:  5.98 cm/s LVIDd:        3.60 cm   +----------------+---------++ +--------------+--------++ LV E/e' lateral:18.6      LVIDs:        2.40 cm  +----------------+---------++ +--------------+--------++ LV e' medial:   4.90 cm/s LV PW:        1.00 cm  +----------------+---------++ +--------------+--------++ LV E/e' medial: 22.7      LV IVS:       1.10 cm  +----------------+---------++ +--------------+--------++ LVOT diam:    2.00 cm  +--------------+--------++ LV  SV:        34 ml    +--------------+--------++ LV SV Index:  20.93    +--------------+--------++ LVOT Area:    3.14 cm +--------------+--------++                        +--------------+--------++ +---------------+----------++ RIGHT VENTRICLE           +---------------+----------++ RV S prime:    12.10 cm/s +---------------+----------++ TAPSE (M-mode):2.2 cm     +---------------+----------++ RVSP:          24.2 mmHg  +---------------+----------++ +---------------+-------++-----------++ LEFT ATRIUM           Index       +---------------+-------++-----------++ LA diam:       2.80 cm1.75 cm/m  +---------------+-------++-----------++ LA Vol (A2C):  51.5 ml32.22 ml/m +---------------+-------++-----------++ LA Vol (A4C):  56.7 ml35.48 ml/m +---------------+-------++-----------++ LA Biplane Vol:57.8 ml36.17 ml/m +---------------+-------++-----------++ +------------+---------++-----------++ RIGHT ATRIUM         Index       +------------+---------++-----------++ RA Pressure:3.00 mmHg            +------------+---------++-----------++ RA Area:    11.30 cm            +------------+---------++-----------++ RA Volume:  24.70 ml 15.45 ml/m +------------+---------++-----------++  +------------+-----------++ AORTIC VALVE            +------------+-----------++ LVOT Vmax:  122.00 cm/s +------------+-----------++ LVOT Vmean: 86.500 cm/s +------------+-----------++ LVOT VTI:   0.330 m      +------------+-----------++  +-------------+-------++ AORTA                +-------------+-------++ Ao Root diam:3.00 cm +-------------+-------++ +--------------+-----------++ +---------------+-----------++ MITRAL VALVE              TRICUSPID VALVE            +--------------+-----------++ +---------------+-----------++ MV Area (PHT):2.08 cm    TR Peak grad:  21.2 mmHg   +--------------+-----------++ +---------------+-----------++ MV PHT:       106.00 msec TR Vmax:       230.00 cm/s +--------------+-----------++ +---------------+-----------++ +--------------+-----------++ Estimated RAP: 3.00 mmHg   MV E velocity:111.00 cm/s +---------------+-----------++ +--------------+-----------++ RVSP:          24.2 mmHg   MV A velocity:156.00 cm/s +---------------+-----------++ +--------------+-----------++ MV E/A ratio: 0.71        +--------------+-------+ +--------------+-----------++ SHUNTS                                              +--------------+-------+                               Systemic VTI: 0.33 m                                +--------------+-------+                               Systemic Diam:2.00 cm                               +--------------+-------+  Vina Gull MD Electronically signed by Vina Gull MD Signature Date/Time: 10/24/2018/1:35:21 PM    Final    MR BRAIN WO CONTRAST Result  Date: 10/24/2018 CLINICAL DATA:  Initial evaluation for new onset right-sided arm and leg weakness. EXAM: MRI HEAD WITHOUT CONTRAST MRA HEAD WITHOUT CONTRAST TECHNIQUE: Multiplanar, multiecho pulse sequences of the brain and surrounding structures were obtained without intravenous contrast. Angiographic images of the head were obtained using MRA technique without contrast. COMPARISON:  Prior CT from earlier the same day. FINDINGS: MRI HEAD FINDINGS Brain: Cerebral volume within normal limits for age. Minimal T2/FLAIR hyperintensity noted within the  periventricular white matter, nonspecific, and felt to be within normal limits for age. 7 mm focus of diffusion abnormality seen at the mid-posterior left centrum semi ovale (series 5, image 80). No associated hemorrhage. No other evidence for acute or subacute ischemia. Gray-white matter differentiation otherwise maintained. No other areas of remote cortical infarction. No foci of susceptibility artifact to suggest acute or chronic intracranial hemorrhage. No mass lesion, midline shift or mass effect. No hydrocephalus. No extra-axial fluid collection. Pituitary gland suprasellar region normal. Midline structures intact. Vascular: Major intracranial vascular flow voids are maintained. Skull and upper cervical spine: Craniocervical junction normal. Bone marrow signal intensity within normal limits. Hyperostosis frontalis interna noted. Scalp soft tissues unremarkable. Sinuses/Orbits: Patient status post bilateral ocular lens replacement. Globes orbital soft tissues demonstrate no acute finding. Paranasal sinuses are clear. No mastoid effusion. Inner ear structures normal. Other: None. MRA HEAD FINDINGS ANTERIOR CIRCULATION: Distal cervical segments of the internal carotid arteries are patent with symmetric antegrade flow. Petrous segments widely patent bilaterally. Scattered atheromatous irregularity within the cavernous/supraclinoid ICAs. No significant stenosis seen on the right. There is a short-segment severe stenosis at the anterior genu of the cavernous left ICA (series 9, image 104). ICA termini well perfused. A1 segments patent bilaterally. Normal anterior communicating artery. Anterior cerebral arteries widely patent to their distal aspects. M1 segments widely patent bilaterally. Normal MCA bifurcations. Distal MCA branches well perfused and symmetric. Mild distal small vessel atheromatous irregularity. POSTERIOR CIRCULATION: Vertebral arteries patent to the vertebrobasilar junction without stenosis. Left  vertebral artery dominant. Posterior inferior cerebral arteries patent bilaterally. Basilar patent to its distal aspect without stenosis. Superior cerebral arteries patent bilaterally. Both of the posterior cerebral arteries primarily supplied via the basilar and are well perfused to their distal aspects. No intracranial aneurysm. IMPRESSION: MRI HEAD IMPRESSION: 1. 7 mm acute ischemic nonhemorrhagic infarct involving the deep white matter of the mid-posterior left centrum semi ovale. 2. Otherwise normal brain MRI for age. MRA HEAD IMPRESSION: 1. Negative intracranial MRA for large vessel occlusion. 2. Short-segment severe cavernous left ICA stenosis. 3. Otherwise negative intracranial MRA. No other hemodynamically significant or correctable stenosis. Electronically Signed   By: Morene Hoard M.D.   On: 10/24/2018 06:59   MR MRA HEAD WO CONTRAST Result Date: 10/24/2018 CLINICAL DATA:  Initial evaluation for new onset right-sided arm and leg weakness. EXAM: MRI HEAD WITHOUT CONTRAST MRA HEAD WITHOUT CONTRAST TECHNIQUE: Multiplanar, multiecho pulse sequences of the brain and surrounding structures were obtained without intravenous contrast. Angiographic images of the head were obtained using MRA technique without contrast. COMPARISON:  Prior CT from earlier the same day. FINDINGS: MRI HEAD FINDINGS Brain: Cerebral volume within normal limits for age. Minimal T2/FLAIR hyperintensity noted within the periventricular white matter, nonspecific, and felt to be within normal limits for age. 7 mm focus of diffusion abnormality seen at the mid-posterior left centrum semi ovale (series 5, image 80). No associated hemorrhage. No other evidence for acute or subacute ischemia. Gray-white matter differentiation otherwise maintained. No other areas of remote cortical infarction. No  foci of susceptibility artifact to suggest acute or chronic intracranial hemorrhage. No mass lesion, midline shift or mass effect. No  hydrocephalus. No extra-axial fluid collection. Pituitary gland suprasellar region normal. Midline structures intact. Vascular: Major intracranial vascular flow voids are maintained. Skull and upper cervical spine: Craniocervical junction normal. Bone marrow signal intensity within normal limits. Hyperostosis frontalis interna noted. Scalp soft tissues unremarkable. Sinuses/Orbits: Patient status post bilateral ocular lens replacement. Globes orbital soft tissues demonstrate no acute finding. Paranasal sinuses are clear. No mastoid effusion. Inner ear structures normal. Other: None. MRA HEAD FINDINGS ANTERIOR CIRCULATION: Distal cervical segments of the internal carotid arteries are patent with symmetric antegrade flow. Petrous segments widely patent bilaterally. Scattered atheromatous irregularity within the cavernous/supraclinoid ICAs. No significant stenosis seen on the right. There is a short-segment severe stenosis at the anterior genu of the cavernous left ICA (series 9, image 104). ICA termini well perfused. A1 segments patent bilaterally. Normal anterior communicating artery. Anterior cerebral arteries widely patent to their distal aspects. M1 segments widely patent bilaterally. Normal MCA bifurcations. Distal MCA branches well perfused and symmetric. Mild distal small vessel atheromatous irregularity. POSTERIOR CIRCULATION: Vertebral arteries patent to the vertebrobasilar junction without stenosis. Left vertebral artery dominant. Posterior inferior cerebral arteries patent bilaterally. Basilar patent to its distal aspect without stenosis. Superior cerebral arteries patent bilaterally. Both of the posterior cerebral arteries primarily supplied via the basilar and are well perfused to their distal aspects. No intracranial aneurysm. IMPRESSION: MRI HEAD IMPRESSION: 1. 7 mm acute ischemic nonhemorrhagic infarct involving the deep white matter of the mid-posterior left centrum semi ovale. 2. Otherwise normal  brain MRI for age. MRA HEAD IMPRESSION: 1. Negative intracranial MRA for large vessel occlusion. 2. Short-segment severe cavernous left ICA stenosis. 3. Otherwise negative intracranial MRA. No other hemodynamically significant or correctable stenosis. Electronically Signed   By: Morene Hoard M.D.   On: 10/24/2018 06:59   CT Head Wo Contrast Result Date: 10/24/2018 CLINICAL DATA:  Right upper extremity heaviness and lower extremity numbness EXAM: CT HEAD WITHOUT CONTRAST TECHNIQUE: Contiguous axial images were obtained from the base of the skull through the vertex without intravenous contrast. COMPARISON:  None. FINDINGS: Brain: No acute territorial infarction, hemorrhage, or intracranial mass. The ventricles are nonenlarged. Vascular: No hyperdense vessels.  Carotid vascular calcification. Skull: Normal. Negative for fracture or focal lesion. Sinuses/Orbits: No acute finding. Other: Chronic appearing abnormality at the anterior arch of C1, incompletely visualized. Prominent degenerative changes at the C1-C2 articulation. IMPRESSION: Negative non contrasted CT appearance of the brain.  Mild atrophy Electronically Signed   By: Luke Bun M.D.   On: 10/24/2018 01:00   DG Chest Portable 1 View Result Date: 10/24/2018 CLINICAL DATA:  TIA EXAM: PORTABLE CHEST 1 VIEW COMPARISON:  08/14/2017 FINDINGS: The heart size and mediastinal contours are within normal limits. Both lungs are clear. Surgical clips over the axilla bilaterally. Aortic atherosclerosis. IMPRESSION: No active disease. Electronically Signed   By: Luke Bun M.D.   On: 10/24/2018 00:43    Assessment & Plan:   Problem List Items Addressed This Visit     Anxiety   Lorazepam  prn  Potential benefits of a long term benzodiazepines  use as well as potential risks  and complications were explained to the patient and were aknowledged.      Depression   Cont on Wellbutrin  XL      Insomnia - Primary   Better Zolpidem  - rare use   Potential benefits of a long term benzodiazepines  use as  well as potential risks  and complications were explained to the patient and were aknowledged.      VERTIGO   Benign Positional Vertigo symptoms - rare  Meclizine  prn Wilhelmena - Daroff exercise several times a day as directed prn      Relevant Medications   meclizine  (ANTIVERT ) 12.5 MG tablet      Meds ordered this encounter  Medications   amLODipine  (NORVASC ) 5 MG tablet    Sig: Take 1 tablet (5 mg total) by mouth daily.    Dispense:  90 tablet    Refill:  3   zolpidem  (AMBIEN ) 10 MG tablet    Sig: Take 1 tablet (10 mg total) by mouth at bedtime.    Dispense:  90 tablet    Refill:  1   meclizine  (ANTIVERT ) 12.5 MG tablet    Sig: Take 1 tablet (12.5 mg total) by mouth 2 (two) times daily as needed for dizziness.    Dispense:  60 tablet    Refill:  2    Replace first script that was sent. MD gave 2 additional refills      Follow-up: Return in about 6 months (around 06/26/2024) for a follow-up visit.  Marolyn Noel, MD

## 2023-12-25 NOTE — Assessment & Plan Note (Signed)
Cont on Wellbutrin XL

## 2023-12-25 NOTE — Assessment & Plan Note (Signed)
Lorazepam prn  Potential benefits of a long term benzodiazepines  use as well as potential risks  and complications were explained to the patient and were aknowledged.  

## 2023-12-25 NOTE — Assessment & Plan Note (Signed)
 Benign Positional Vertigo symptoms - rare  Meclizine  prn Cynthia Garza - Daroff exercise several times a day as directed prn

## 2023-12-25 NOTE — Assessment & Plan Note (Signed)
 Better Zolpidem  - rare use  Potential benefits of a long term benzodiazepines  use as well as potential risks  and complications were explained to the patient and were aknowledged.

## 2023-12-29 ENCOUNTER — Ambulatory Visit: Payer: Self-pay | Admitting: Internal Medicine

## 2024-01-22 DIAGNOSIS — H353123 Nonexudative age-related macular degeneration, left eye, advanced atrophic without subfoveal involvement: Secondary | ICD-10-CM | POA: Diagnosis not present

## 2024-01-22 DIAGNOSIS — H43813 Vitreous degeneration, bilateral: Secondary | ICD-10-CM | POA: Diagnosis not present

## 2024-01-22 DIAGNOSIS — H353112 Nonexudative age-related macular degeneration, right eye, intermediate dry stage: Secondary | ICD-10-CM | POA: Diagnosis not present

## 2024-01-22 DIAGNOSIS — Z961 Presence of intraocular lens: Secondary | ICD-10-CM | POA: Diagnosis not present

## 2024-03-10 ENCOUNTER — Ambulatory Visit

## 2024-03-18 ENCOUNTER — Ambulatory Visit (INDEPENDENT_AMBULATORY_CARE_PROVIDER_SITE_OTHER)

## 2024-03-18 DIAGNOSIS — Z23 Encounter for immunization: Secondary | ICD-10-CM

## 2024-03-18 NOTE — Progress Notes (Cosign Needed Addendum)
 HD flu given w/o complication  Medical screening examination/treatment/procedure(s) were performed by non-physician practitioner and as supervising physician I was immediately available for consultation/collaboration.  I agree with above. Karlynn Noel, MD

## 2024-03-21 DIAGNOSIS — H903 Sensorineural hearing loss, bilateral: Secondary | ICD-10-CM | POA: Diagnosis not present

## 2024-04-01 DIAGNOSIS — M25562 Pain in left knee: Secondary | ICD-10-CM | POA: Diagnosis not present

## 2024-04-01 DIAGNOSIS — M25561 Pain in right knee: Secondary | ICD-10-CM | POA: Diagnosis not present

## 2024-04-01 DIAGNOSIS — M170 Bilateral primary osteoarthritis of knee: Secondary | ICD-10-CM | POA: Diagnosis not present

## 2024-04-01 DIAGNOSIS — R262 Difficulty in walking, not elsewhere classified: Secondary | ICD-10-CM | POA: Diagnosis not present

## 2024-04-12 DIAGNOSIS — M25561 Pain in right knee: Secondary | ICD-10-CM | POA: Diagnosis not present

## 2024-04-12 DIAGNOSIS — R262 Difficulty in walking, not elsewhere classified: Secondary | ICD-10-CM | POA: Diagnosis not present

## 2024-04-12 DIAGNOSIS — M25562 Pain in left knee: Secondary | ICD-10-CM | POA: Diagnosis not present

## 2024-04-12 DIAGNOSIS — M170 Bilateral primary osteoarthritis of knee: Secondary | ICD-10-CM | POA: Diagnosis not present

## 2024-05-02 ENCOUNTER — Ambulatory Visit: Payer: Self-pay

## 2024-05-02 NOTE — Telephone Encounter (Signed)
 FYI Only or Action Required?: FYI only for provider: UC recommended d/t no appt availability today.  Patient was last seen in primary care on 12/25/2023 by Plotnikov, Cynthia GAILS, Cynthia Garza.  Called Nurse Triage reporting Chest Pain and Cough.  Symptoms began several days ago.  Interventions attempted: Nothing.  Symptoms are: unchanged.  Triage Disposition: See HCP Within 4 Hours (Or PCP Triage)  Patient/caregiver understands and will follow disposition?: Yes   Copied from CRM #[id number]. Topic: Clinical - Red Word Triage >> May 02, 2024 10:28 AM Cynthia Garza wrote: Red Word that prompted transfer to Nurse Triage: Concerned for pneumonia. Cold like symptoms, cough that's deep. Pain in rib/chest, too painful to do a full cough Reason for Disposition  [1] MILD difficulty breathing (e.g., minimal/no SOB at rest, SOB with walking, pulse < 100) AND [2] still present when not coughing  Additional Information  Commented on: Fever > 100.4 F (38.0 C)    Denies fever; suspects pneumonia  Answer Assessment - Initial Assessment Questions Pt granddaughter called in requesting appt. States that pt was out of town last week and reports her daughter was treated for sinus infection. Starting Saturday night, 12/06, pt developed cough and R rib pain that is worse with cough. Per granddaughter, pt has audible chest congestion but denies nasal congestion, chest pain, SOB or fever. Pt worried about pneumonia. No appts available within tier until tomorrow 12/09, so granddaughter elected to take pt to urgent care.     1. LOCATION: Where does it hurt?       R ribs; reports more chest pain r/t congestion   2. RADIATION: Does the pain go anywhere else? (e.g., into neck, jaw, arms, back)     None   3. ONSET: When did the chest pain begin? (Minutes, hours or days)      Saturday night; 12/06  4. PATTERN: Does the pain come and go, or has it been constant since it started?  Does it get worse with exertion?       Worse with coughing   5. DURATION: How long does it last (e.g., seconds, minutes, hours)     Sore constantly; worse with coughing   6. SEVERITY: How bad is the pain?  (e.g., Scale 1-10; mild, moderate, or severe)     Moderate   7. CARDIAC RISK FACTORS: Do you have any history of heart problems or risk factors for heart disease? (e.g., angina, prior heart attack; diabetes, high blood pressure, high cholesterol, smoker, or strong family history of heart disease)     Hx high cholesterol and HTN; managed with medication   8. PULMONARY RISK FACTORS: Do you have any history of lung disease?  (e.g., blood clots in lung, asthma, emphysema, birth control pills)     Mini stroke   9. CAUSE: What do you think is causing the chest pain?     Suspects pneumonia   10. OTHER SYMPTOMS: Do you have any other symptoms? (e.g., dizziness, nausea, vomiting, sweating, fever, difficulty breathing, cough)       Cough, cold symptoms  Protocols used: Chest Pain-A-AH, Cough - Acute Non-Productive-A-AH

## 2024-05-03 NOTE — Telephone Encounter (Signed)
 Office visit with any available provider please.  Thanks

## 2024-05-13 NOTE — Telephone Encounter (Signed)
 Patient seen via Urgent Care for this issue

## 2024-05-20 ENCOUNTER — Other Ambulatory Visit: Payer: Self-pay | Admitting: Internal Medicine

## 2024-06-01 ENCOUNTER — Other Ambulatory Visit: Payer: Self-pay | Admitting: Internal Medicine

## 2024-06-28 ENCOUNTER — Ambulatory Visit: Admitting: Internal Medicine

## 2024-07-22 ENCOUNTER — Ambulatory Visit: Admitting: Internal Medicine

## 2024-12-14 ENCOUNTER — Ambulatory Visit
